# Patient Record
Sex: Female | Born: 1935
Health system: Southern US, Community
[De-identification: ages and names within clinical notes are randomized; demographics above are authoritative.]

## PROBLEM LIST (undated history)

## (undated) DIAGNOSIS — M1710 Unilateral primary osteoarthritis, unspecified knee: Secondary | ICD-10-CM

## (undated) DIAGNOSIS — N390 Urinary tract infection, site not specified: Secondary | ICD-10-CM

## (undated) DIAGNOSIS — Z9289 Personal history of other medical treatment: Secondary | ICD-10-CM

## (undated) DIAGNOSIS — M810 Age-related osteoporosis without current pathological fracture: Secondary | ICD-10-CM

## (undated) DIAGNOSIS — R0600 Dyspnea, unspecified: Secondary | ICD-10-CM

## (undated) DIAGNOSIS — I10 Essential (primary) hypertension: Secondary | ICD-10-CM

## (undated) DIAGNOSIS — C50919 Malignant neoplasm of unspecified site of unspecified female breast: Secondary | ICD-10-CM

## (undated) DIAGNOSIS — C9110 Chronic lymphocytic leukemia of B-cell type not having achieved remission: Secondary | ICD-10-CM

## (undated) DIAGNOSIS — H3530 Unspecified macular degeneration: Secondary | ICD-10-CM

## (undated) DIAGNOSIS — E785 Hyperlipidemia, unspecified: Secondary | ICD-10-CM

## (undated) DIAGNOSIS — F419 Anxiety disorder, unspecified: Secondary | ICD-10-CM

## (undated) HISTORY — DX: Malignant neoplasm of unspecified site of unspecified female breast: C50.919

## (undated) HISTORY — DX: Personal history of other medical treatment: Z92.89

## (undated) HISTORY — DX: Age-related osteoporosis without current pathological fracture: M81.0

## (undated) HISTORY — DX: Unilateral primary osteoarthritis, unspecified knee: M17.10

## (undated) HISTORY — PX: APPENDECTOMY: SHX54

## (undated) HISTORY — DX: Chronic lymphocytic leukemia of B-cell type not having achieved remission: C91.10

## (undated) HISTORY — PX: INCONTINENCE SURGERY: SHX676

## (undated) HISTORY — PX: OTHER SURGICAL HISTORY: SHX169

## (undated) HISTORY — DX: Hyperlipidemia, unspecified: E78.5

## (undated) HISTORY — DX: Essential (primary) hypertension: I10

## (undated) HISTORY — DX: Unspecified macular degeneration: H35.30

## (undated) HISTORY — PX: EYE SURGERY: SHX253

---

## 1970-09-18 HISTORY — PX: ABDOMINAL HYSTERECTOMY: SHX81

## 1999-06-14 ENCOUNTER — Other Ambulatory Visit: Admission: RE | Admit: 1999-06-14 | Discharge: 1999-06-14 | Payer: Self-pay | Admitting: Family Medicine

## 1999-10-04 ENCOUNTER — Encounter: Admission: RE | Admit: 1999-10-04 | Discharge: 1999-10-04 | Payer: Self-pay | Admitting: Family Medicine

## 1999-10-04 ENCOUNTER — Encounter: Payer: Self-pay | Admitting: Family Medicine

## 2001-09-16 ENCOUNTER — Other Ambulatory Visit: Admission: RE | Admit: 2001-09-16 | Discharge: 2001-09-16 | Payer: Self-pay | Admitting: Family Medicine

## 2001-11-26 ENCOUNTER — Encounter: Payer: Self-pay | Admitting: Family Medicine

## 2001-11-26 ENCOUNTER — Encounter: Admission: RE | Admit: 2001-11-26 | Discharge: 2001-11-26 | Payer: Self-pay | Admitting: Family Medicine

## 2002-03-14 ENCOUNTER — Encounter: Admission: RE | Admit: 2002-03-14 | Discharge: 2002-03-14 | Payer: Self-pay | Admitting: Family Medicine

## 2002-03-14 ENCOUNTER — Encounter: Payer: Self-pay | Admitting: Family Medicine

## 2002-09-18 DIAGNOSIS — C9110 Chronic lymphocytic leukemia of B-cell type not having achieved remission: Secondary | ICD-10-CM

## 2002-09-18 HISTORY — DX: Chronic lymphocytic leukemia of B-cell type not having achieved remission: C91.10

## 2002-10-19 DIAGNOSIS — C50919 Malignant neoplasm of unspecified site of unspecified female breast: Secondary | ICD-10-CM

## 2002-10-19 HISTORY — PX: MASTECTOMY PARTIAL / LUMPECTOMY W/ AXILLARY LYMPHADENECTOMY: SUR852

## 2002-10-19 HISTORY — DX: Malignant neoplasm of unspecified site of unspecified female breast: C50.919

## 2002-10-23 ENCOUNTER — Other Ambulatory Visit: Admission: RE | Admit: 2002-10-23 | Discharge: 2002-10-23 | Payer: Self-pay | Admitting: Radiology

## 2002-11-10 ENCOUNTER — Encounter: Payer: Self-pay | Admitting: General Surgery

## 2002-11-10 ENCOUNTER — Encounter: Admission: RE | Admit: 2002-11-10 | Discharge: 2002-11-10 | Payer: Self-pay | Admitting: General Surgery

## 2002-11-11 ENCOUNTER — Encounter (INDEPENDENT_AMBULATORY_CARE_PROVIDER_SITE_OTHER): Payer: Self-pay | Admitting: *Deleted

## 2002-11-11 ENCOUNTER — Encounter: Payer: Self-pay | Admitting: Radiology

## 2002-11-11 ENCOUNTER — Ambulatory Visit (HOSPITAL_BASED_OUTPATIENT_CLINIC_OR_DEPARTMENT_OTHER): Admission: RE | Admit: 2002-11-11 | Discharge: 2002-11-11 | Payer: Self-pay | Admitting: General Surgery

## 2002-11-11 ENCOUNTER — Encounter: Payer: Self-pay | Admitting: General Surgery

## 2002-11-11 ENCOUNTER — Encounter: Admission: RE | Admit: 2002-11-11 | Discharge: 2002-11-11 | Payer: Self-pay | Admitting: General Surgery

## 2002-12-01 ENCOUNTER — Ambulatory Visit: Admission: RE | Admit: 2002-12-01 | Discharge: 2003-02-04 | Payer: Self-pay | Admitting: *Deleted

## 2002-12-22 ENCOUNTER — Other Ambulatory Visit: Admission: RE | Admit: 2002-12-22 | Discharge: 2002-12-22 | Payer: Self-pay | Admitting: Oncology

## 2003-12-28 ENCOUNTER — Ambulatory Visit (HOSPITAL_COMMUNITY): Admission: RE | Admit: 2003-12-28 | Discharge: 2003-12-28 | Payer: Self-pay | Admitting: Gastroenterology

## 2004-05-26 ENCOUNTER — Ambulatory Visit (HOSPITAL_COMMUNITY): Admission: RE | Admit: 2004-05-26 | Discharge: 2004-05-26 | Payer: Self-pay | Admitting: *Deleted

## 2004-09-20 ENCOUNTER — Ambulatory Visit: Payer: Self-pay | Admitting: Oncology

## 2005-01-05 ENCOUNTER — Ambulatory Visit: Payer: Self-pay | Admitting: Oncology

## 2005-07-04 ENCOUNTER — Ambulatory Visit: Payer: Self-pay | Admitting: Oncology

## 2006-01-02 ENCOUNTER — Ambulatory Visit: Payer: Self-pay | Admitting: Oncology

## 2006-01-03 LAB — COMPREHENSIVE METABOLIC PANEL
AST: 23 U/L (ref 0–37)
Albumin: 4.6 g/dL (ref 3.5–5.2)
BUN: 14 mg/dL (ref 6–23)
CO2: 28 mEq/L (ref 19–32)
Calcium: 9.8 mg/dL (ref 8.4–10.5)
Chloride: 103 mEq/L (ref 96–112)
Glucose, Bld: 81 mg/dL (ref 70–99)
Potassium: 4.3 mEq/L (ref 3.5–5.3)

## 2006-01-03 LAB — MORPHOLOGY

## 2006-01-03 LAB — CBC WITH DIFFERENTIAL/PLATELET
Basophils Absolute: 0 10*3/uL (ref 0.0–0.1)
Eosinophils Absolute: 0.1 10*3/uL (ref 0.0–0.5)
HCT: 44.4 % (ref 34.8–46.6)
HGB: 14.8 g/dL (ref 11.6–15.9)
LYMPH%: 78.8 % — ABNORMAL HIGH (ref 14.0–48.0)
MCH: 30.6 pg (ref 26.0–34.0)
MCV: 91.8 fL (ref 81.0–101.0)
MONO%: 4.7 % (ref 0.0–13.0)
NEUT#: 3.1 10*3/uL (ref 1.5–6.5)
NEUT%: 15.8 % — ABNORMAL LOW (ref 39.6–76.8)
Platelets: 141 10*3/uL — ABNORMAL LOW (ref 145–400)

## 2006-01-03 LAB — LACTATE DEHYDROGENASE: LDH: 158 U/L (ref 94–250)

## 2006-07-02 ENCOUNTER — Ambulatory Visit: Payer: Self-pay | Admitting: Oncology

## 2006-07-04 LAB — COMPREHENSIVE METABOLIC PANEL
ALT: 15 U/L (ref 0–40)
Albumin: 4.6 g/dL (ref 3.5–5.2)
CO2: 27 mEq/L (ref 19–32)
Calcium: 9.9 mg/dL (ref 8.4–10.5)
Chloride: 104 mEq/L (ref 96–112)
Potassium: 4 mEq/L (ref 3.5–5.3)
Sodium: 140 mEq/L (ref 135–145)
Total Protein: 7.1 g/dL (ref 6.0–8.3)

## 2006-07-04 LAB — CBC WITH DIFFERENTIAL/PLATELET
BASO%: 0.6 % (ref 0.0–2.0)
HCT: 43.4 % (ref 34.8–46.6)
MCHC: 33.8 g/dL (ref 32.0–36.0)
MONO#: 1.1 10*3/uL — ABNORMAL HIGH (ref 0.1–0.9)
NEUT%: 16.5 % — ABNORMAL LOW (ref 39.6–76.8)
RBC: 4.73 10*6/uL (ref 3.70–5.32)
WBC: 23.1 10*3/uL — ABNORMAL HIGH (ref 3.9–10.0)
lymph#: 17.9 10*3/uL — ABNORMAL HIGH (ref 0.9–3.3)

## 2006-07-04 LAB — LACTATE DEHYDROGENASE: LDH: 159 U/L (ref 94–250)

## 2006-07-06 ENCOUNTER — Ambulatory Visit (HOSPITAL_COMMUNITY): Admission: RE | Admit: 2006-07-06 | Discharge: 2006-07-06 | Payer: Self-pay | Admitting: Oncology

## 2006-11-08 ENCOUNTER — Inpatient Hospital Stay (HOSPITAL_COMMUNITY): Admission: RE | Admit: 2006-11-08 | Discharge: 2006-11-10 | Payer: Self-pay | Admitting: Urology

## 2006-11-08 ENCOUNTER — Encounter (INDEPENDENT_AMBULATORY_CARE_PROVIDER_SITE_OTHER): Payer: Self-pay | Admitting: *Deleted

## 2006-12-28 ENCOUNTER — Ambulatory Visit: Payer: Self-pay | Admitting: Oncology

## 2007-01-02 LAB — CBC WITH DIFFERENTIAL/PLATELET
BASO%: 0.1 % (ref 0.0–2.0)
Basophils Absolute: 0 10*3/uL (ref 0.0–0.1)
LYMPH%: 74.6 % — ABNORMAL HIGH (ref 14.0–48.0)
MCH: 29.4 pg (ref 26.0–34.0)
MCV: 88.9 fL (ref 81.0–101.0)
MONO#: 0.8 10*3/uL (ref 0.1–0.9)
NEUT#: 3.8 10*3/uL (ref 1.5–6.5)
NEUT%: 20.7 % — ABNORMAL LOW (ref 39.6–76.8)
RBC: 4.68 10*6/uL (ref 3.70–5.32)
lymph#: 13.6 10*3/uL — ABNORMAL HIGH (ref 0.9–3.3)

## 2007-01-02 LAB — MORPHOLOGY: PLT EST: DECREASED

## 2007-01-02 LAB — COMPREHENSIVE METABOLIC PANEL
ALT: 13 U/L (ref 0–35)
Albumin: 4.3 g/dL (ref 3.5–5.2)
CO2: 25 mEq/L (ref 19–32)
Glucose, Bld: 156 mg/dL — ABNORMAL HIGH (ref 70–99)
Potassium: 3.8 mEq/L (ref 3.5–5.3)
Sodium: 140 mEq/L (ref 135–145)
Total Bilirubin: 0.5 mg/dL (ref 0.3–1.2)
Total Protein: 6.8 g/dL (ref 6.0–8.3)

## 2007-01-02 LAB — LACTATE DEHYDROGENASE: LDH: 153 U/L (ref 94–250)

## 2007-06-24 ENCOUNTER — Ambulatory Visit: Payer: Self-pay | Admitting: Oncology

## 2007-06-26 LAB — CBC WITH DIFFERENTIAL/PLATELET
BASO%: 0.2 % (ref 0.0–2.0)
EOS%: 0.7 % (ref 0.0–7.0)
HCT: 41.8 % (ref 34.8–46.6)
LYMPH%: 78.4 % — ABNORMAL HIGH (ref 14.0–48.0)
MCH: 30.6 pg (ref 26.0–34.0)
MCHC: 34.4 g/dL (ref 32.0–36.0)
MONO#: 0.8 10*3/uL (ref 0.1–0.9)
NEUT%: 16.6 % — ABNORMAL LOW (ref 39.6–76.8)
Platelets: 141 10*3/uL — ABNORMAL LOW (ref 145–400)
RBC: 4.69 10*6/uL (ref 3.70–5.32)
WBC: 20.1 10*3/uL — ABNORMAL HIGH (ref 3.9–10.0)
lymph#: 15.8 10*3/uL — ABNORMAL HIGH (ref 0.9–3.3)

## 2007-06-26 LAB — COMPREHENSIVE METABOLIC PANEL
Alkaline Phosphatase: 75 U/L (ref 39–117)
CO2: 25 mEq/L (ref 19–32)
Creatinine, Ser: 0.66 mg/dL (ref 0.40–1.20)
Glucose, Bld: 85 mg/dL (ref 70–99)
Total Bilirubin: 0.5 mg/dL (ref 0.3–1.2)

## 2007-06-26 LAB — MORPHOLOGY: RBC Comments: NORMAL

## 2007-12-23 ENCOUNTER — Ambulatory Visit: Payer: Self-pay | Admitting: Oncology

## 2007-12-25 LAB — COMPREHENSIVE METABOLIC PANEL
CO2: 25 mEq/L (ref 19–32)
Glucose, Bld: 99 mg/dL (ref 70–99)
Sodium: 143 mEq/L (ref 135–145)
Total Bilirubin: 0.8 mg/dL (ref 0.3–1.2)
Total Protein: 7.3 g/dL (ref 6.0–8.3)

## 2007-12-25 LAB — CBC WITH DIFFERENTIAL/PLATELET
BASO%: 0.3 % (ref 0.0–2.0)
Eosinophils Absolute: 0.1 10*3/uL (ref 0.0–0.5)
MCHC: 33.9 g/dL (ref 32.0–36.0)
MONO#: 1 10*3/uL — ABNORMAL HIGH (ref 0.1–0.9)
NEUT#: 3.6 10*3/uL (ref 1.5–6.5)
RBC: 4.88 10*6/uL (ref 3.70–5.32)
WBC: 24.2 10*3/uL — ABNORMAL HIGH (ref 3.9–10.0)
lymph#: 19.4 10*3/uL — ABNORMAL HIGH (ref 0.9–3.3)

## 2007-12-25 LAB — LACTATE DEHYDROGENASE: LDH: 164 U/L (ref 94–250)

## 2008-03-27 ENCOUNTER — Ambulatory Visit: Payer: Self-pay | Admitting: Oncology

## 2008-04-01 LAB — CBC WITH DIFFERENTIAL/PLATELET
BASO%: 0.2 % (ref 0.0–2.0)
LYMPH%: 75.5 % — ABNORMAL HIGH (ref 14.0–48.0)
MCHC: 33.8 g/dL (ref 32.0–36.0)
MCV: 89.4 fL (ref 81.0–101.0)
MONO#: 1.1 10*3/uL — ABNORMAL HIGH (ref 0.1–0.9)
MONO%: 4.7 % (ref 0.0–13.0)
NEUT#: 4.3 10*3/uL (ref 1.5–6.5)
Platelets: 128 10*3/uL — ABNORMAL LOW (ref 145–400)
RBC: 4.63 10*6/uL (ref 3.70–5.32)
RDW: 13.9 % (ref 11.3–14.5)
WBC: 22.6 10*3/uL — ABNORMAL HIGH (ref 3.9–10.0)

## 2008-06-11 ENCOUNTER — Ambulatory Visit: Payer: Self-pay | Admitting: Oncology

## 2008-06-18 LAB — CBC WITH DIFFERENTIAL/PLATELET
Basophils Absolute: 0 10*3/uL (ref 0.0–0.1)
Eosinophils Absolute: 0.1 10*3/uL (ref 0.0–0.5)
HCT: 43.5 % (ref 34.8–46.6)
HGB: 14.4 g/dL (ref 11.6–15.9)
LYMPH%: 77.7 % — ABNORMAL HIGH (ref 14.0–48.0)
MCV: 91.5 fL (ref 81.0–101.0)
MONO#: 0.7 10*3/uL (ref 0.1–0.9)
MONO%: 3 % (ref 0.0–13.0)
NEUT#: 4.2 10*3/uL (ref 1.5–6.5)
NEUT%: 18.5 % — ABNORMAL LOW (ref 39.6–76.8)
Platelets: 123 10*3/uL — ABNORMAL LOW (ref 145–400)
WBC: 22.7 10*3/uL — ABNORMAL HIGH (ref 3.9–10.0)

## 2008-06-18 LAB — COMPREHENSIVE METABOLIC PANEL
Albumin: 4 g/dL (ref 3.5–5.2)
Alkaline Phosphatase: 73 U/L (ref 39–117)
BUN: 16 mg/dL (ref 6–23)
Calcium: 10.1 mg/dL (ref 8.4–10.5)
Chloride: 104 mEq/L (ref 96–112)
Glucose, Bld: 136 mg/dL — ABNORMAL HIGH (ref 70–99)
Potassium: 3.7 mEq/L (ref 3.5–5.3)
Sodium: 140 mEq/L (ref 135–145)
Total Protein: 6.8 g/dL (ref 6.0–8.3)

## 2008-06-18 LAB — MORPHOLOGY: RBC Comments: NORMAL

## 2008-06-25 ENCOUNTER — Ambulatory Visit (HOSPITAL_COMMUNITY): Admission: RE | Admit: 2008-06-25 | Discharge: 2008-06-25 | Payer: Self-pay | Admitting: Oncology

## 2008-12-31 ENCOUNTER — Ambulatory Visit: Payer: Self-pay | Admitting: Oncology

## 2009-01-04 LAB — COMPREHENSIVE METABOLIC PANEL
Albumin: 4.4 g/dL (ref 3.5–5.2)
Alkaline Phosphatase: 76 U/L (ref 39–117)
BUN: 15 mg/dL (ref 6–23)
CO2: 27 mEq/L (ref 19–32)
Calcium: 9.3 mg/dL (ref 8.4–10.5)
Chloride: 104 mEq/L (ref 96–112)
Glucose, Bld: 89 mg/dL (ref 70–99)
Potassium: 3.9 mEq/L (ref 3.5–5.3)
Total Protein: 7 g/dL (ref 6.0–8.3)

## 2009-01-04 LAB — CBC WITH DIFFERENTIAL/PLATELET
Basophils Absolute: 0.1 10*3/uL (ref 0.0–0.1)
Eosinophils Absolute: 0.1 10*3/uL (ref 0.0–0.5)
HGB: 15.3 g/dL (ref 11.6–15.9)
MCV: 92 fL (ref 79.5–101.0)
MONO#: 0.9 10*3/uL (ref 0.1–0.9)
MONO%: 3.8 % (ref 0.0–14.0)
NEUT#: 3 10*3/uL (ref 1.5–6.5)
Platelets: 113 10*3/uL — ABNORMAL LOW (ref 145–400)
RDW: 14.4 % (ref 11.2–14.5)
WBC: 24 10*3/uL — ABNORMAL HIGH (ref 3.9–10.3)

## 2009-06-03 ENCOUNTER — Ambulatory Visit: Payer: Self-pay | Admitting: Oncology

## 2009-06-07 LAB — CBC WITH DIFFERENTIAL/PLATELET
Basophils Absolute: 0 10*3/uL (ref 0.0–0.1)
Eosinophils Absolute: 0.2 10*3/uL (ref 0.0–0.5)
HCT: 43.6 % (ref 34.8–46.6)
HGB: 14.8 g/dL (ref 11.6–15.9)
LYMPH%: 81.8 % — ABNORMAL HIGH (ref 14.0–49.7)
MCV: 92.5 fL (ref 79.5–101.0)
MONO%: 4.9 % (ref 0.0–14.0)
NEUT#: 2.9 10*3/uL (ref 1.5–6.5)
Platelets: 110 10*3/uL — ABNORMAL LOW (ref 145–400)
RDW: 14 % (ref 11.2–14.5)

## 2009-06-07 LAB — MORPHOLOGY
PLT EST: DECREASED
RBC Comments: NORMAL

## 2009-06-07 LAB — COMPREHENSIVE METABOLIC PANEL
AST: 20 U/L (ref 0–37)
Alkaline Phosphatase: 82 U/L (ref 39–117)
BUN: 18 mg/dL (ref 6–23)
Calcium: 9.2 mg/dL (ref 8.4–10.5)
Creatinine, Ser: 0.66 mg/dL (ref 0.40–1.20)

## 2009-09-02 ENCOUNTER — Ambulatory Visit: Payer: Self-pay | Admitting: Oncology

## 2009-09-06 LAB — CBC WITH DIFFERENTIAL/PLATELET
Basophils Absolute: 0.1 10*3/uL (ref 0.0–0.1)
HCT: 44.7 % (ref 34.8–46.6)
HGB: 14.8 g/dL (ref 11.6–15.9)
MONO#: 0.7 10*3/uL (ref 0.1–0.9)
NEUT%: 14.9 % — ABNORMAL LOW (ref 38.4–76.8)
WBC: 23.5 10*3/uL — ABNORMAL HIGH (ref 3.9–10.3)
lymph#: 19.2 10*3/uL — ABNORMAL HIGH (ref 0.9–3.3)

## 2009-09-06 LAB — MORPHOLOGY: PLT EST: DECREASED

## 2009-12-02 ENCOUNTER — Ambulatory Visit: Payer: Self-pay | Admitting: Oncology

## 2009-12-06 LAB — COMPREHENSIVE METABOLIC PANEL
Albumin: 4.3 g/dL (ref 3.5–5.2)
BUN: 18 mg/dL (ref 6–23)
Calcium: 9.4 mg/dL (ref 8.4–10.5)
Chloride: 104 mEq/L (ref 96–112)
Glucose, Bld: 86 mg/dL (ref 70–99)
Potassium: 4.1 mEq/L (ref 3.5–5.3)

## 2009-12-06 LAB — CBC WITH DIFFERENTIAL/PLATELET
Basophils Absolute: 0 10*3/uL (ref 0.0–0.1)
EOS%: 0.4 % (ref 0.0–7.0)
HCT: 45.3 % (ref 34.8–46.6)
HGB: 15.3 g/dL (ref 11.6–15.9)
MCH: 31.4 pg (ref 25.1–34.0)
MCV: 93 fL (ref 79.5–101.0)
MONO%: 3.7 % (ref 0.0–14.0)
NEUT%: 12.8 % — ABNORMAL LOW (ref 38.4–76.8)
RDW: 14.1 % (ref 11.2–14.5)

## 2010-04-11 ENCOUNTER — Ambulatory Visit: Payer: Self-pay | Admitting: Oncology

## 2010-04-13 LAB — COMPREHENSIVE METABOLIC PANEL
ALT: 11 U/L (ref 0–35)
AST: 18 U/L (ref 0–37)
Calcium: 9.4 mg/dL (ref 8.4–10.5)
Chloride: 103 mEq/L (ref 96–112)
Creatinine, Ser: 0.7 mg/dL (ref 0.40–1.20)
Sodium: 138 mEq/L (ref 135–145)
Total Protein: 6.9 g/dL (ref 6.0–8.3)

## 2010-04-13 LAB — CBC WITH DIFFERENTIAL/PLATELET
Basophils Absolute: 0.1 10*3/uL (ref 0.0–0.1)
Eosinophils Absolute: 0.1 10*3/uL (ref 0.0–0.5)
HCT: 43.3 % (ref 34.8–46.6)
HGB: 14.6 g/dL (ref 11.6–15.9)
LYMPH%: 79.2 % — ABNORMAL HIGH (ref 14.0–49.7)
MCV: 92.6 fL (ref 79.5–101.0)
MONO#: 1.2 10*3/uL — ABNORMAL HIGH (ref 0.1–0.9)
NEUT#: 3.6 10*3/uL (ref 1.5–6.5)
NEUT%: 15.2 % — ABNORMAL LOW (ref 38.4–76.8)
Platelets: 111 10*3/uL — ABNORMAL LOW (ref 145–400)
WBC: 23.5 10*3/uL — ABNORMAL HIGH (ref 3.9–10.3)

## 2010-04-13 LAB — MORPHOLOGY
PLT EST: DECREASED
RBC Comments: NORMAL

## 2010-08-22 ENCOUNTER — Ambulatory Visit: Payer: Self-pay | Admitting: Oncology

## 2010-08-24 LAB — CBC WITH DIFFERENTIAL/PLATELET
Eosinophils Absolute: 0.1 10*3/uL (ref 0.0–0.5)
MONO#: 0.6 10*3/uL (ref 0.1–0.9)
MONO%: 2.9 % (ref 0.0–14.0)
NEUT#: 2.7 10*3/uL (ref 1.5–6.5)
RBC: 4.71 10*6/uL (ref 3.70–5.45)
RDW: 14.4 % (ref 11.2–14.5)
WBC: 21 10*3/uL — ABNORMAL HIGH (ref 3.9–10.3)

## 2010-08-24 LAB — MORPHOLOGY: RBC Comments: NORMAL

## 2010-08-24 LAB — COMPREHENSIVE METABOLIC PANEL
Alkaline Phosphatase: 79 U/L (ref 39–117)
Glucose, Bld: 115 mg/dL — ABNORMAL HIGH (ref 70–99)
Sodium: 141 mEq/L (ref 135–145)
Total Bilirubin: 0.6 mg/dL (ref 0.3–1.2)
Total Protein: 6.8 g/dL (ref 6.0–8.3)

## 2010-12-21 ENCOUNTER — Encounter (HOSPITAL_BASED_OUTPATIENT_CLINIC_OR_DEPARTMENT_OTHER): Payer: BC Managed Care – HMO | Admitting: Oncology

## 2010-12-21 ENCOUNTER — Other Ambulatory Visit: Payer: Self-pay | Admitting: Oncology

## 2010-12-21 DIAGNOSIS — R21 Rash and other nonspecific skin eruption: Secondary | ICD-10-CM

## 2010-12-21 DIAGNOSIS — M899 Disorder of bone, unspecified: Secondary | ICD-10-CM

## 2010-12-21 DIAGNOSIS — C9110 Chronic lymphocytic leukemia of B-cell type not having achieved remission: Secondary | ICD-10-CM

## 2010-12-21 DIAGNOSIS — Z853 Personal history of malignant neoplasm of breast: Secondary | ICD-10-CM

## 2010-12-21 LAB — CBC WITH DIFFERENTIAL/PLATELET
Basophils Absolute: 0 10*3/uL (ref 0.0–0.1)
Eosinophils Absolute: 0.1 10*3/uL (ref 0.0–0.5)
HCT: 46.1 % (ref 34.8–46.6)
HGB: 15.4 g/dL (ref 11.6–15.9)
LYMPH%: 79.1 % — ABNORMAL HIGH (ref 14.0–49.7)
MCHC: 33.5 g/dL (ref 31.5–36.0)
MONO#: 1.2 10*3/uL — ABNORMAL HIGH (ref 0.1–0.9)
NEUT#: 3.3 10*3/uL (ref 1.5–6.5)
NEUT%: 14.6 % — ABNORMAL LOW (ref 38.4–76.8)
Platelets: 97 10*3/uL — ABNORMAL LOW (ref 145–400)
WBC: 22.4 10*3/uL — ABNORMAL HIGH (ref 3.9–10.3)

## 2010-12-21 LAB — MORPHOLOGY: PLT EST: DECREASED

## 2010-12-22 LAB — COMPREHENSIVE METABOLIC PANEL
ALT: 12 U/L (ref 0–35)
AST: 20 U/L (ref 0–37)
CO2: 26 mEq/L (ref 19–32)
Calcium: 10 mg/dL (ref 8.4–10.5)
Chloride: 103 mEq/L (ref 96–112)
Potassium: 4.1 mEq/L (ref 3.5–5.3)
Sodium: 140 mEq/L (ref 135–145)
Total Protein: 6.8 g/dL (ref 6.0–8.3)

## 2010-12-22 LAB — LACTATE DEHYDROGENASE: LDH: 144 U/L (ref 94–250)

## 2011-02-03 NOTE — H&P (Signed)
NAME:  Abigail Moreno, GUNDRUM NO.:  [id number]   MEDICAL RECORD NO.:  [id number]          PATIENT TYPE:  INP   LOCATION:  X002                         FACILITY:  [facility]   PHYSICIAN:  Sigmund I. Patsi Sears, M.D.DATE OF BIRTH:  10-23-1928   DATE OF ADMISSION:  11/08/2006  DATE OF DISCHARGE:                              HISTORY & PHYSICAL   CHIEF COMPLAINT:  Complete pelvic organ prolapse.   HISTORY OF PRESENT ILLNESS:  [name] is a 77 year old lady who  was seen at Dr. Imelda Pillow regarding her complete pelvic organ  prolapse.  The patient has been bothered by the bulge.  She does have  nocturia and difficulty starting her urination.  Otherwise, the patient  denies any history of UTIs.  She does recall a history of bladder  tacking in 1995.   PAST MEDICAL HISTORY:  Significant for:  1. Elevated cholesterol.  2. Left breast cancer in 2004.  3. Chronic leukemia.   PAST SURGICAL HISTORY:  1. Benign breast cyst.  2. Hysterectomy.  3. Bladder tack.  4. Cataract surgery.   MEDICATIONS:  1. Lotrel.  2. Femara.  3. Zocor.  4. Actonel.  5. __________ .  6. Calcium.  7. Multivitamins.   ALLERGIES:  NOVOCAIN.   FAMILY HISTORY:  Significant for kidney stones in her son.   SOCIAL HISTORY:  The patient is retired.  She is married with 2 sons and  1 daughter.  She smoked 1 pack a week for about 3 years but denies  smoking the last 40 years.  The patient denies any alcohol abuse.   REVIEW OF SYSTEMS:  Significant for chronic sinusitis, chronic cough,  anxiety; however, rest of review of systems done and is negative.   PHYSICAL EXAMINATION:  GENERAL:  The patient is in no acute distress.  She awake, alert and oriented.  LUNGS:  Good breath sounds bilaterally.  ABDOMEN:  Soft, nontender.  No cervical lymphadenopathy. No CVA  tenderness.  SKIN:  Reveals no rashes.  EXTREMITIES:  Do not reveal any swellings.  PELVIC:  Did reveal complete pelvic organ prolapse  with thickened  vaginal mucosa changes.  The patient is not sexually active.   ASSESSMENT/PLAN:  This is a 77 year old lady with complete pelvic organ  prolapse.  The patient will undergo a cystocele, rectocele and entocele  repair with potential grafting.  Postoperatively should be observed on  the floor for postop care.  The potential benefits and risks of the  procedure were discussed and the patient agreed to proceed forward.     ______________________________  Terie Purser, MD      Sigmund I. Patsi Sears, M.D.  Electronically Signed    JH/MEDQ  D:  11/08/2006  T:  11/08/2006  Job:  [id number]

## 2011-02-03 NOTE — Discharge Summary (Signed)
NAME:  Abigail Moreno, Abigail Moreno           ACCOUNT NO.:  [id number]   MEDICAL RECORD NO.:  [id number]          PATIENT TYPE:  INP   LOCATION:  1431                         FACILITY:  [facility]   PHYSICIAN:  Sigmund I. Patsi Sears, M.D.DATE OF BIRTH:  [birth year]   DATE OF ADMISSION:  11/08/2006  DATE OF DISCHARGE:  11/10/2006                               DISCHARGE SUMMARY   ADMISSION DIAGNOSIS:  Complete pelvic organ prolapse with caruncle.   DISCHARGE DIAGNOSIS:  Complete pelvic organ prolapse with caruncle.   PROCEDURES DONE:  1. Laser excision of caruncle.  2. Anterior vaginal vault prolapse with vault suspension using graft      Harrold Donath ), rectocele with enterocele repair, and cystoscopy, all      done on November 08, 2006.   HISTORY OF PRESENT ILLNESS/HOSPITAL COURSE:  Abigail Moreno is a 77-  year-old lady who presented to Dr. Imelda Pillow attention with complete  pelvic organ prolapse.  At this point, she is becoming bothered by the  bulge.  After extensive counseling, the patient elected for surgical  repair.  The potential benefit and risks were extensively discussed, and  the patient did want to proceed forward.  The patient was admitted on  November 08, 2006 in which she successfully underwent the procedure.  Postoperatively, the patient has a Foley catheter and vaginal packs.  Both were kept for two days.   Her postoperative course was relatively smooth and uneventful.  The  patient was ambulating with no problems.  Her pain was adequately  controlled.  By postop day #2, the patient vaginal packs and the Foley  catheter were removed, and the patient voided with no problems.  The  patient remained hemodynamically stable throughout her hospital stay.  On postop day #2, the patient was discharged home.  At discharge, the  patient was afebrile.  Her vital signs were stable.  She was awake,  alert and oriented.  Her abdomen was soft and nontender.  Her wound was  intact with no  evidence of hematoma or bleeding.  Gross motor  neurological exam is nonfocal.  The patient was then discharged home on  antibiotics, pain medications, and stool softeners.  She was advised to  resume her home medications.  She is to follow up with Dr. Patsi Sears,  as scheduled.  Should the patient have any fever, chills, nausea,  vomiting, inability to void or any questions or concerns, she is to  contact us or come to the ED.     ______________________________  Cornelious Bryant, MD      Sigmund I. Patsi Sears, M.D.  Electronically Signed    SK/MEDQ  D:  11/12/2006  T:  11/12/2006  Job:  [id number]

## 2011-02-03 NOTE — Op Note (Signed)
NAME:  Abigail Moreno, Abigail Moreno NO.:  [id number]   MEDICAL RECORD NO.:  [id number]          PATIENT TYPE:  INP   LOCATION:  X002                         FACILITY:  [facility]   PHYSICIAN:  Sigmund I. Patsi Sears, M.D.DATE OF BIRTH:  Feb 09, 1929   DATE OF PROCEDURE:  11/08/2006  DATE OF DISCHARGE:                               OPERATIVE REPORT   PREOPERATIVE DIAGNOSIS:  Complete pelvic organ prolapse with caruncle.   POSTOPERATIVE DIAGNOSIS:  Complete pelvic organ prolapse with caruncle.   OPERATIONS:  1. Laser excision of caruncle.  2. Anterior prolapse repair with graft (Xenform.)  3. Rectocele with enterocele repair.  4. Cystoscopy.   SURGEON:  Sigmund I. Patsi Sears, M.D.   ASSISTANT:  Martina Sinner, M.D.   SECOND ASSISTANT:  Cornelious Bryant, M.D.   ANESTHESIA:  General.   SPECIMENS:  Enterocele sac to pathology for permanent section.   ESTIMATED BLOOD LOSS:  About 100 cc.   COMPLICATIONS:  None.   INDICATIONS FOR PROCEDURE:  This is a 77 year old lady who was seen and  evaluated by Dr. Patsi Sears in clinic for pelvic organ prolapse.  The  patient has had a hysterectomy done before.  At this point, her pelvic  organ prolapse is really bothering her and she wants it to be fixed.  On  physical examination, she did have a small urethral caruncle.  After  extensive counseling, she consented for laser excision of the caruncle  in addition to transvaginal vault suspension, cystocele, enterocele and  rectocele repair.   DESCRIPTION OF PROCEDURE:  The patient was brought to the operating  room.  Preoperative antibiotics were given.  General anesthesia was  induced.  She was placed in the dorsal lithotomy position.  Bilateral  SCD's were applied to prevent DVT.  Pressure points were padded  appropriately and care was taken during positioning to avoid neuropathy  and compartment syndrome.  The patient was prepped and draped in normal  sterile fashion.  Using the  holmium laser, the caruncle at the level of  the urethra was burned.  Foley catheter was then inserted.  On physical  examination, the patient did have complete pelvic organ prolapse with a  bulge coming out through her vaginal introitus.  The vaginal vault  dimple was clearly identified and 0 Vicryl suture was applied there to  delineate the apex of the vaginal vault.  Midline incision was done  across the vaginal mucosa along the anterior wall, vaginal apex, and the  posterior wall.  Dissection was then carried elevating the vaginal  mucosa from the underlying endocervical fascia and the rectovaginal  fascia.  After mobilizing the vaginal mucosa, scope retractors were  applied and the mucosa was then retracted using the hooks.  A large  cystocele was evident with the enterocele abutting the posterior aspect  of the cystocele.  Using a combination of traction, countertraction,  blunt and sharp dissection, the enterocele sac was dissected off the  posterior wall of the cystocele.  Cystoscopy and inflating the bladder  with saline did help in the identification process and the dissection  process.  The enterocele sac was mobilized anteriorly off  the cystocele,  laterally from the vaginal mucosa, and posterior from the rectocele.  After adequate mobilization, the enterocele sac was then nicked with  Strully scissors and was entered.  Using Ethibond, the neck of the  enterocele sac was then tied in a pursestring fashion.  The excess  enterocele sac was then cut.  Following repair of the enterocele, indigo  carmine was given.  Cystoscopy did confirm efflux of urine through both  ureteral orifices.   Cystocele repair:  The cystocele was dissected laterally to the white  line on the pelvic sidewalls.  The dissection was also carried  posteriorly to the level of the ischial spines.  The cystocele was then  repaired using Kelly plication.  Absorbable suture was used and the  cystocele was  repaired using two suture lines, thus reducing the  cystocele adequately.  Using the [facility] device, an Ethibond suture was  applied at the level of the ischial spine bilaterally.  The bullet on  each of the Ethibond sutures was then cut and Mayo needle was then used  to bring the sutures through the Xenform graft.  Prior to the use of the  Xenform graft, the graft was imbibed in saline.  The size of the graft  was 8 x 12 cm and it was cut in a trapezoid fashion.  The broad base of  the trapezoid going into the proximal aspect of the graft.  The Ethibond  sutures were brought through the graft at the level of the broad base.  The other end of the Ethibond suture had the needle that was brought  again through the broad base of the graft.  The graft was then tied down  to the level of the ischial spines bilaterally.  This reduced the  cystocele completely and the patient had a flattened anterior vaginal  wall.  Using 2-0 Vicryl, deep bites were taken into the pelvic sidewall  bilaterally and the sutures were then brought through the distal aspect  of the graft and tied.  This ended the anterior vaginal vault suspension  and cystocele repair.  Following the cystocele and enterocele repair,  cystoscopy did reveal blue dye coming through both ureteral orifices  with no problems.   Rectocele repair:  Rectal exam was done.  This clearly demarcated the  rectal wall herniation through the posterior rectovaginal fascia.  An in  situ defect repair was done using 2-0 Vicryl in a running fashion.  This  was done in a two layer fashion.  Repeat rectal examination did not  reveal any evidence of suture in the rectum.  This flattened the  posterior vaginal wall completely.  Redundant vaginal mucosa was then  trimmed.  The vaginal mucosa was then closed using 2-0 Vicryl in a  running fashion.  Estrogen cream soaked pads were then applied.  The Foley catheter was then put into dependent drainage.  The  procedure  terminated without complications.  The patient was extubated in the  operating room and taken in satisfactory condition to the PACU.     ______________________________  Cornelious Bryant, MD      Sigmund I. Patsi Sears, M.D.  Electronically Signed    SK/MEDQ  D:  11/08/2006  T:  11/08/2006  Job:  [id number]

## 2011-04-05 ENCOUNTER — Other Ambulatory Visit: Payer: Self-pay | Admitting: Oncology

## 2011-04-05 ENCOUNTER — Encounter (HOSPITAL_BASED_OUTPATIENT_CLINIC_OR_DEPARTMENT_OTHER): Payer: BC Managed Care – HMO | Admitting: Oncology

## 2011-04-05 ENCOUNTER — Other Ambulatory Visit (HOSPITAL_COMMUNITY)
Admission: RE | Admit: 2011-04-05 | Discharge: 2011-04-05 | Disposition: A | Discharge status: Home / Self Care | Payer: Medicare Other | Source: Ambulatory Visit | Attending: Oncology | Admitting: Oncology

## 2011-04-05 DIAGNOSIS — M949 Disorder of cartilage, unspecified: Secondary | ICD-10-CM

## 2011-04-05 DIAGNOSIS — M899 Disorder of bone, unspecified: Secondary | ICD-10-CM

## 2011-04-05 DIAGNOSIS — R21 Rash and other nonspecific skin eruption: Secondary | ICD-10-CM

## 2011-04-05 DIAGNOSIS — C9110 Chronic lymphocytic leukemia of B-cell type not having achieved remission: Secondary | ICD-10-CM

## 2011-04-05 DIAGNOSIS — Z853 Personal history of malignant neoplasm of breast: Secondary | ICD-10-CM

## 2011-04-05 LAB — CBC WITH DIFFERENTIAL/PLATELET
Basophils Absolute: 0 10*3/uL (ref 0.0–0.1)
EOS%: 0.4 % (ref 0.0–7.0)
Eosinophils Absolute: 0.1 10*3/uL (ref 0.0–0.5)
HCT: 43.8 % (ref 34.8–46.6)
HGB: 14.6 g/dL (ref 11.6–15.9)
MCH: 30.8 pg (ref 25.1–34.0)
MCV: 92.4 fL (ref 79.5–101.0)
MONO%: 3.9 % (ref 0.0–14.0)
NEUT#: 2.8 10*3/uL (ref 1.5–6.5)
NEUT%: 13.3 % — ABNORMAL LOW (ref 38.4–76.8)
lymph#: 17.1 10*3/uL — ABNORMAL HIGH (ref 0.9–3.3)

## 2011-04-05 LAB — MORPHOLOGY

## 2011-04-07 LAB — FLOW CYTOMETRY

## 2011-04-19 LAB — CYTOGENETICS, PERIPHERAL BLOOD

## 2011-06-28 ENCOUNTER — Other Ambulatory Visit: Payer: Self-pay | Admitting: Oncology

## 2011-06-28 ENCOUNTER — Encounter (HOSPITAL_BASED_OUTPATIENT_CLINIC_OR_DEPARTMENT_OTHER): Payer: Medicare Other | Admitting: Oncology

## 2011-06-28 DIAGNOSIS — C9110 Chronic lymphocytic leukemia of B-cell type not having achieved remission: Secondary | ICD-10-CM

## 2011-06-28 DIAGNOSIS — Z853 Personal history of malignant neoplasm of breast: Secondary | ICD-10-CM

## 2011-06-28 DIAGNOSIS — R21 Rash and other nonspecific skin eruption: Secondary | ICD-10-CM

## 2011-06-28 DIAGNOSIS — M949 Disorder of cartilage, unspecified: Secondary | ICD-10-CM

## 2011-06-28 LAB — CBC WITH DIFFERENTIAL/PLATELET
Basophils Absolute: 0.1 10*3/uL (ref 0.0–0.1)
Eosinophils Absolute: 0.1 10*3/uL (ref 0.0–0.5)
HGB: 14.6 g/dL (ref 11.6–15.9)
LYMPH%: 77.1 % — ABNORMAL HIGH (ref 14.0–49.7)
MCV: 91.9 fL (ref 79.5–101.0)
MONO%: 5.5 % (ref 0.0–14.0)
NEUT#: 3.5 10*3/uL (ref 1.5–6.5)
NEUT%: 16.7 % — ABNORMAL LOW (ref 38.4–76.8)
Platelets: 91 10*3/uL — ABNORMAL LOW (ref 145–400)

## 2011-07-19 ENCOUNTER — Ambulatory Visit (HOSPITAL_COMMUNITY)
Admission: RE | Admit: 2011-07-19 | Discharge: 2011-07-19 | Disposition: A | Discharge status: Home / Self Care | Payer: Medicare Other | Source: Ambulatory Visit | Attending: Oncology | Admitting: Oncology

## 2011-07-19 ENCOUNTER — Other Ambulatory Visit: Payer: Self-pay | Admitting: Oncology

## 2011-07-19 DIAGNOSIS — C9110 Chronic lymphocytic leukemia of B-cell type not having achieved remission: Secondary | ICD-10-CM | POA: Insufficient documentation

## 2011-09-16 ENCOUNTER — Telehealth: Payer: Self-pay | Admitting: Oncology

## 2011-09-16 NOTE — Telephone Encounter (Signed)
S/w pt, gave appt 10/20/11 @ 9am.

## 2011-09-28 ENCOUNTER — Ambulatory Visit
Admission: RE | Admit: 2011-09-28 | Discharge: 2011-09-28 | Disposition: A | Discharge status: Home / Self Care | Payer: Medicare Other | Source: Ambulatory Visit | Attending: Family Medicine | Admitting: Family Medicine

## 2011-09-28 ENCOUNTER — Other Ambulatory Visit: Payer: Self-pay | Admitting: Family Medicine

## 2011-09-28 DIAGNOSIS — M25551 Pain in right hip: Secondary | ICD-10-CM

## 2011-10-20 ENCOUNTER — Telehealth: Payer: Self-pay | Admitting: Oncology

## 2011-10-20 ENCOUNTER — Ambulatory Visit (HOSPITAL_BASED_OUTPATIENT_CLINIC_OR_DEPARTMENT_OTHER): Payer: Medicare Other | Admitting: Oncology

## 2011-10-20 ENCOUNTER — Other Ambulatory Visit (HOSPITAL_BASED_OUTPATIENT_CLINIC_OR_DEPARTMENT_OTHER): Payer: Medicare Other | Admitting: Lab

## 2011-10-20 VITALS — BP 141/92 | HR 78 | Temp 96.8°F | Ht 61.0 in | Wt 153.7 lb

## 2011-10-20 DIAGNOSIS — C9110 Chronic lymphocytic leukemia of B-cell type not having achieved remission: Secondary | ICD-10-CM

## 2011-10-20 DIAGNOSIS — IMO0002 Reserved for concepts with insufficient information to code with codable children: Secondary | ICD-10-CM

## 2011-10-20 DIAGNOSIS — Z853 Personal history of malignant neoplasm of breast: Secondary | ICD-10-CM

## 2011-10-20 DIAGNOSIS — H579 Unspecified disorder of eye and adnexa: Secondary | ICD-10-CM

## 2011-10-20 LAB — COMPREHENSIVE METABOLIC PANEL
AST: 20 U/L (ref 0–37)
Albumin: 4.7 g/dL (ref 3.5–5.2)
Alkaline Phosphatase: 76 U/L (ref 39–117)
Potassium: 4 mEq/L (ref 3.5–5.3)
Sodium: 142 mEq/L (ref 135–145)
Total Protein: 7 g/dL (ref 6.0–8.3)

## 2011-10-20 LAB — CBC WITH DIFFERENTIAL/PLATELET
Basophils Absolute: 0.1 10*3/uL (ref 0.0–0.1)
Eosinophils Absolute: 0.1 10*3/uL (ref 0.0–0.5)
HGB: 15.1 g/dL (ref 11.6–15.9)
MCV: 91.6 fL (ref 79.5–101.0)
NEUT#: 3.2 10*3/uL (ref 1.5–6.5)
RDW: 14.7 % — ABNORMAL HIGH (ref 11.2–14.5)
lymph#: 14.7 10*3/uL — ABNORMAL HIGH (ref 0.9–3.3)

## 2011-10-20 LAB — MORPHOLOGY: PLT EST: DECREASED

## 2011-10-20 NOTE — Telephone Encounter (Signed)
Gv pt appt for march2013 °

## 2011-10-20 NOTE — Patient Instructions (Signed)
Your platelets are still a little low, 88 today.  We will try prednisone 20 mg with breakfast for 3 days every other week:  Feb 11,12,13 and Feb 25,26,27.  Dr.Darril Patriarca with see you and repeat blood counts on March 1.   You can use your xanax at bedtime if prednisone makes it hard to sleep. Let Dr.Olin know if the prednisone helps your hip.   Call if you need to tell Dr.Felicha Frayne anything about the prednisone before next visit    [phone].

## 2011-10-20 NOTE — Progress Notes (Signed)
OFFICE PROGRESS NOTE Date of Visit Oct 20, 2011 Physicians: K.Clelia Croft, M.Olin  INTERVAL HISTORY:   Patient is seen, alone for visit today, in continuing attention to her CLL, this diagnosed in 2004 and not treated to this point, altho platelet count has been gradually decreasing and we have anticipated beginning some treatment in near future. She has had no bleeding since last visit, and no fevers or symptoms of infection. Abdominal US for spleen size was done 07-19-11 with normal splenic volume of 214 ml, with no focal splenic defects. Note flow cytometry in July 2012 confirmed CLL, with FISH suggesting 13q- and 17p- .  Patient is receiving avastin injections to left eye, follow up with ophthalmology upcoming and I have given her copy of today's CBC for that appointment. She saw primary Dr.K.Clelia Croft recently including for hip pain, with Lspine films 09-28-11 showing diffuse debenerative disc disease and osteopenia without compression fractures, and pelvis xray with significant degenerative joint disease in hips particularly right. She was seen by orthopedics PA and has appointment with Dr.Matthew Charlann Boxer ~ 11-10-11. She has been using 1 aleve every 3 days and uses very sparing hydrocodone from Dr.Shaw. Next appointment for Dr.Shaw is scheduled in a year, with blood work in interim. Otherwise, she has had no fever or symptoms of infection, no bleeding or excessive bruising; no respiratory, cardiac, GI or bladder symptoms. Husband has had respiratory infection, improving. She has had no swelling LE. Remainder of 10 point Review of Systems negative/ unchanged.  Objective:  Vital signs in last 24 hours:  BP 141/92  Pulse 78  Temp(Src) 96.8 F (36 C) (Oral)  Ht 5\' 1"  (1.549 m)  Wt 153 lb 11.2 oz (69.718 kg)  BMI 29.04 kg/m2  Mobile slowly in office due to hips, otherwise appears comfortable.Weight is stable  HEENT:mucous membranes moist, pharynx normal without lesions. PERRL.  LymphaticsCervical,  supraclavicular, and axillary nodes normal.CORRECTION: cervical and supraclavicular adenopathy right > left ~ 4-5 cm aggregate, rubbery, nontender.No bulky axillary or inguinal nodes. Resp: clear to auscultation bilaterally and normal percussion bilaterally Cardio: regular rate and rhythm GI: soft, non-tender; bowel sounds normal; no masses,  no organomegaly including no palpable spleen Extremities: extremities normal, atraumatic, no cyanosis or edema Breasts without dominant masses, skin changes, nipple discharge. Left lumpectomy scar well-healed. No swelling UE. Skin without bruises or petechiae, no rash.    Lab Results:   [facility] 10/20/11 0848  WBC 19.0*  HGB 15.1  HCT 44.7  PLT 88*   ANC 3.2 plt 98k in July, 91k in Oct Some variant lymphs, few smudge cells, no giant plt called BMET  [facility] 10/20/11 0848  NA 142  K 4.0  CL 103  CO2 29  GLUCOSE 88  BUN 23  CREATININE 0.75  CALCIUM 10.3  alb 4.7. Remainder of full CMET WNL. LDH 143   Studies/Results:  US spleen and plain xrays as above  Medications: I have reviewed the patient's current medications.  With platelets lower, I have recommended that we try low dose prednisone over next few weeks, to see if that is helpful. She will take prednisone 20 mg with breakfast 2-11,12,13 and again 2-25,26,27. If the prednisone helps hip symptoms also, she will let Dr.Olin know. She can use xanax at hs if difficulty sleeping. She knows that she can call at any time prior to my next visit 11-17-11 if concerns including bleeding or problems with steroids.  Assessment/Plan:  1. CLL with lower platelets in absence of splenomegaly, counts, peripheral adenopathy and clinically  otherwise stable. Will try careful prednisone as above. 2. Ophthalmologic problems, receiving intraoccular avastin with no complications reported 3.Significant degenerative arthritis and disc disease, orthopedics evaluation in process. We have tried to use NSAID  sparingly due to thrombocytopenia.  4.left breast cancer early stage 03-25-2004treated with lumpectomy and sentinel node evaluation, local RT and 5 years of Femara thru April 2009. No known active disease since then. For mammograms ~ 12/11/22. 5.Death of granddaughter in past year.       LIVESAY,LENNIS P, MD   10/20/2011, 10:04 PM

## 2011-11-01 ENCOUNTER — Telehealth: Payer: Self-pay

## 2011-11-01 NOTE — Telephone Encounter (Signed)
FAXED SIGNED ORDERS DATED 11-01-11 TO [facility] FOR MAMMOGRAM WITH [place] IF NECESSARY.  APPT. AT [facility] IS ON 11-13-11.

## 2011-11-17 ENCOUNTER — Telehealth: Payer: Self-pay | Admitting: Oncology

## 2011-11-17 ENCOUNTER — Other Ambulatory Visit: Payer: Medicare Other | Admitting: Lab

## 2011-11-17 ENCOUNTER — Ambulatory Visit (HOSPITAL_BASED_OUTPATIENT_CLINIC_OR_DEPARTMENT_OTHER): Payer: Medicare Other | Admitting: Oncology

## 2011-11-17 VITALS — BP 132/82 | HR 80 | Temp 96.7°F | Ht 61.0 in | Wt 153.6 lb

## 2011-11-17 DIAGNOSIS — C50919 Malignant neoplasm of unspecified site of unspecified female breast: Secondary | ICD-10-CM

## 2011-11-17 DIAGNOSIS — C9110 Chronic lymphocytic leukemia of B-cell type not having achieved remission: Secondary | ICD-10-CM

## 2011-11-17 LAB — COMPREHENSIVE METABOLIC PANEL
AST: 23 U/L (ref 0–37)
Albumin: 4.8 g/dL (ref 3.5–5.2)
Alkaline Phosphatase: 76 U/L (ref 39–117)
BUN: 19 mg/dL (ref 6–23)
Creatinine, Ser: 0.72 mg/dL (ref 0.50–1.10)
Potassium: 4.1 mEq/L (ref 3.5–5.3)

## 2011-11-17 LAB — CBC WITH DIFFERENTIAL/PLATELET
Eosinophils Absolute: 0.1 10*3/uL (ref 0.0–0.5)
LYMPH%: 77.2 % — ABNORMAL HIGH (ref 14.0–49.7)
MCHC: 33.2 g/dL (ref 31.5–36.0)
MCV: 92.1 fL (ref 79.5–101.0)
MONO%: 4.7 % (ref 0.0–14.0)
NEUT#: 4 10*3/uL (ref 1.5–6.5)
Platelets: 96 10*3/uL — ABNORMAL LOW (ref 145–400)
RBC: 5.06 10*6/uL (ref 3.70–5.45)

## 2011-11-17 NOTE — Patient Instructions (Signed)
Call if bleeding or infection.  Hip surgery April 9 as scheduled.

## 2011-11-17 NOTE — Telephone Encounter (Signed)
Gv pt appt for may2013 

## 2011-11-18 DIAGNOSIS — C50919 Malignant neoplasm of unspecified site of unspecified female breast: Secondary | ICD-10-CM | POA: Insufficient documentation

## 2011-11-18 DIAGNOSIS — C9110 Chronic lymphocytic leukemia of B-cell type not having achieved remission: Secondary | ICD-10-CM | POA: Insufficient documentation

## 2011-11-18 NOTE — Progress Notes (Signed)
OFFICE PROGRESS NOTE Date of Visit 11-17-2011 Physicians: K.Clelia Croft, M.Olin   INTERVAL HISTORY:   Patient is seen, alone for visit today, in follow up of recent lower platelet count, this in setting of CLL. The CLL was diagnosed in 2004 and has not required intervention until now; the slightly progressive thrombocytopenia has not been symptomatic. Abdominal US in Oct 2012 had normal splenic volume. Since she was here last in early Feb, we tried two short courses of prednisone at just 20 mg daily x 3 every other week. She tolerated this without difficulty, did not notice any improvement in the orthopedic symptoms with the steroids. Last prednisone was Feb 25 - 27. CBC today has platelets up to 96k, these having been 88k prior to the brief steroids.  PMH is also significant for an early stage left breast cancer in 2004, treated with lumpectomy with sentinel node evaluation, local RT and 5 years of Femara thru 12-2007. She had bilateral mammograms at [facility] 11-13-2011, with heterogeneously dense breast tissue and some increase in benign appearing calcifications bilaterally, with no mammographic findings of concern.  Patient has been seen back by Dr.Olin, and would like to proceed with right hip replacement as she is very symptomatic from degenerative problems there. The surgery is scheduled for 12-26-2011. She understands that she maybe at somewhat increased risk of bleeding and need platelet transfusion with the surgery, however I think this is still reasonable and expect that she is as stable now for surgery as she will be.  Objective:  Vital signs in last 24 hours:  BP 132/82  Pulse 80  Temp(Src) 96.7 F (35.9 C) (Oral)  Ht 5\' 1"  (1.549 m)  Wt 153 lb 9.6 oz (69.673 kg)  BMI 29.02 kg/m2  Alert, looks comfortable seated in exam room, slowly ambulatory without assistance.  HEENT:mucous membranes moist, pharynx normal without lesions. PERRL LymphaticsCervical, supraclavicular, and axillary nodes  normal. and unchanged Resp: clear to auscultation bilaterally Cardio: regular rate and rhythm GI: soft, non-tender; bowel sounds normal; no masses,  no organomegaly Extremities: extremities normal, atraumatic, no cyanosis or edema Skin without bruises or petechiae  Lab Results:   Basename 11/17/11 1238  WBC 22.9*  HGB 15.5  HCT 46.6  PLT 96*  no giant platelets seen  BMET  Basename 11/17/11 1238  NA 141  K 4.1  CL 100  CO2 28  GLUCOSE 83  BUN 19  CREATININE 0.72  CALCIUM 10.7*  albumin 4.8 Remainder of full CMET WNL Studies/Results:  No results found.  Medications: I have reviewed the patient's current medications. We will not do further prednisone before hip replacement.  Assessment/Plan:  1.CLL: improvement in platelet count today, still just under 100k. No other obvious need to begin treatment immediately. I will see her back in May, or sooner if needed. 2.History of early stage left breast cancer, post treatment as above and not known active. Mammograms done as above. 3.Degenerative disease right hip, markedly limiting activity. For hip replacement per Dr.Olin 4.obesity Patient was in agreement with plan.       Reece Packer, MD   11/18/2011, 5:32 PM

## 2011-11-20 ENCOUNTER — Telehealth: Payer: Self-pay

## 2011-11-20 NOTE — Telephone Encounter (Signed)
FAXED OFFICE NOTES FROM 2-1;11-17-11 AND LABS FROM 11-17-11 TO DR. OLIN  ATTENTION SHERRY FOR PT.'S SURGERY 12-26-11.

## 2011-12-11 ENCOUNTER — Encounter (HOSPITAL_COMMUNITY): Payer: Self-pay

## 2011-12-12 ENCOUNTER — Encounter (HOSPITAL_COMMUNITY): Payer: Self-pay

## 2011-12-12 ENCOUNTER — Encounter (HOSPITAL_COMMUNITY)
Admission: RE | Admit: 2011-12-12 | Discharge: 2011-12-12 | Disposition: A | Discharge status: Home / Self Care | Payer: Medicare Other | Source: Ambulatory Visit | Attending: Orthopedic Surgery | Admitting: Orthopedic Surgery

## 2011-12-12 ENCOUNTER — Ambulatory Visit (HOSPITAL_COMMUNITY)
Admission: RE | Admit: 2011-12-12 | Discharge: 2011-12-12 | Disposition: A | Discharge status: Home / Self Care | Payer: Medicare Other | Source: Ambulatory Visit | Attending: Orthopedic Surgery | Admitting: Orthopedic Surgery

## 2011-12-12 DIAGNOSIS — I10 Essential (primary) hypertension: Secondary | ICD-10-CM | POA: Insufficient documentation

## 2011-12-12 DIAGNOSIS — J984 Other disorders of lung: Secondary | ICD-10-CM | POA: Insufficient documentation

## 2011-12-12 DIAGNOSIS — Z01818 Encounter for other preprocedural examination: Secondary | ICD-10-CM | POA: Insufficient documentation

## 2011-12-12 DIAGNOSIS — Z01812 Encounter for preprocedural laboratory examination: Secondary | ICD-10-CM | POA: Insufficient documentation

## 2011-12-12 HISTORY — DX: Anxiety disorder, unspecified: F41.9

## 2011-12-12 LAB — BASIC METABOLIC PANEL
BUN: 17 mg/dL (ref 6–23)
Chloride: 101 mEq/L (ref 96–112)
Creatinine, Ser: 0.6 mg/dL (ref 0.50–1.10)
Glucose, Bld: 83 mg/dL (ref 70–99)
Potassium: 3.9 mEq/L (ref 3.5–5.1)

## 2011-12-12 LAB — DIFFERENTIAL
Basophils Relative: 0 % (ref 0–1)
Eosinophils Absolute: 0 10*3/uL (ref 0.0–0.7)
Lymphs Abs: 16.8 10*3/uL — ABNORMAL HIGH (ref 0.7–4.0)
Monocytes Absolute: 0.8 10*3/uL (ref 0.1–1.0)
Neutro Abs: 2.9 10*3/uL (ref 1.7–7.7)

## 2011-12-12 LAB — URINALYSIS, ROUTINE W REFLEX MICROSCOPIC
Bilirubin Urine: NEGATIVE
Glucose, UA: NEGATIVE mg/dL
Hgb urine dipstick: NEGATIVE
Nitrite: NEGATIVE
Specific Gravity, Urine: 1.023 (ref 1.005–1.030)
pH: 6 (ref 5.0–8.0)

## 2011-12-12 LAB — SURGICAL PCR SCREEN: Staphylococcus aureus: NEGATIVE

## 2011-12-12 LAB — URINE MICROSCOPIC-ADD ON

## 2011-12-12 LAB — CBC
HCT: 46.5 % — ABNORMAL HIGH (ref 36.0–46.0)
Hemoglobin: 15.2 g/dL — ABNORMAL HIGH (ref 12.0–15.0)
MCH: 30 pg (ref 26.0–34.0)
MCHC: 32.7 g/dL (ref 30.0–36.0)
RDW: 13.8 % (ref 11.5–15.5)

## 2011-12-12 LAB — PATHOLOGIST SMEAR REVIEW

## 2011-12-12 NOTE — Patient Instructions (Signed)
20 Abigail Moreno  12/12/2011   Your procedure is scheduled on:  12/26/11 1200noon-130pm  Report to [facility] at 0930 AM.  Call this number if you have problems the morning of surgery: [phone]   Remember:   Do not eat food:After Midnight.  May have clear liquids:until Midnight .   Take these medicines the morning of surgery with A SIP OF WATER:   Do not wear jewelry, make-up or nail polish.  Do not wear lotions, powders, or perfumes.   Do not shave 48 hours prior to surgery.  Do not bring valuables to the hospital.  Contacts, dentures or bridgework may not be worn into surgery.  Leave suitcase in the car. After surgery it may be brought to your room.  For patients admitted to the hospital, checkout time is 11:00 AM the day of discharge.    Special Instructions: CHG Shower Use Special Wash: 1/2 bottle night before surgery and 1/2 bottle morning of surgery. shower chin to toes with CHG.  Wash face and private parts with regular soap.     Please read over the following fact sheets that you were given: MRSA Information and Surgical Site Infection Prevention , coughing and deep breathing exercises, leg exercises, Incentive Spirometry Fact sheet, Blood Transfusion Fact Sheet

## 2011-12-12 NOTE — Pre-Procedure Instructions (Signed)
12/05/11 EKG on chart  Clearance note on chart from Dr Darrold Span 11/17/11 LOV note Dr Clelia Croft on chart  10/20/11 LOV note Dr Darrold Span on chart

## 2011-12-12 NOTE — Pre-Procedure Instructions (Signed)
12/12/11 Abigail Moreno , PA paged and made aware of abnormal labs.  Abigail Moreno stated he would look at labs.

## 2011-12-17 NOTE — H&P (Signed)
Abigail Moreno is an 78 y.o. female.    Chief Complaint: right hip OA and pain   HPI: Pt is a 78 y.o. female complaining of right hip pain for 2-3 years. Pain had continually increased since the beginning. X-rays in the clinic show end-stage arthritic changes of the right hip. Pt has tried various conservative treatments which have failed to alleviate their symptoms. Various options are discussed with the patient. Risks, benefits and expectations were discussed with the patient. Patient understand the risks, benefits and expectations and wishes to proceed with surgery.   PCP:  Lupita Raider, MD, MD  D/C Plans:  Home with HHPT  Post-op Meds:  Rx given for Xarelto, Robaxin, Iron, Colace and MiraLax  Tranexamic Acid:   Not to be given  Decadron:   Not to be given  PMH: Past Medical History  Diagnosis Date  . Breast cancer FEBRUARY 2004    T1, NO, ER/PR POSITIVE LEFT BREAST  . CLL (chronic lymphocytic leukemia) 2004  . Hypertension   . Hyperlipemia   . Blood transfusion     hx of   . Arthritis of knee  FALL 2012    RIGHT KNEE and hips   . Anxiety     PSH: Past Surgical History  Procedure Date  . Mastectomy partial / lumpectomy w/ axillary lymphadenectomy FEBRUARY 2004    LEFT BREAST  . Abdominal hysterectomy 1972    UNILATERAL OOPHORECTOMY( LEFT)  . Appendectomy   . Incontinence surgery   . Eye surgery     left eye surgery   . Other surgical history     cysts removed from breasts     Social History:  reports that she quit smoking about 53 years ago. She has never used smokeless tobacco. She reports that she drinks alcohol. She reports that she does not use illicit drugs.  Allergies:  Allergies  Allergen Reactions  . Novocain Swelling  . Sulfa Antibiotics Other (See Comments)    Bacteria in intestines     Medications: No current facility-administered medications for this encounter.   Current Outpatient Prescriptions  Medication Sig Dispense Refill  .  ALPRAZolam (XANAX) 0.25 MG tablet Take 0.25 mg by mouth every 8 (eight) hours as needed.      Marland Kitchen amLODipine-benazepril (LOTREL) 5-20 MG per capsule Take 1 capsule by mouth daily.      . Calcium Carbonate-Vitamin D (CALCIUM 600 + D PO) Take 1 tablet by mouth 2 (two) times daily.      . Cinnamon 500 MG capsule Take 500 mg by mouth 2 (two) times daily.      Marland Kitchen HYDROcodone-acetaminophen (VICODIN) 5-500 MG per tablet Take 5-500 mg by mouth Every 6 hours as needed. pain      . MULTIPLE VITAMIN PO Take 1 tablet by mouth daily.      . Multiple Vitamins-Minerals (ICAPS) CAPS Take 2 capsules by mouth 2 (two) times daily.      . OMEGA 3 1200 MG CAPS Take 1 capsule by mouth 3 (three) times daily.        ROS: Review of Systems  Constitutional: Negative.   HENT: Negative.   Eyes: Negative.   Respiratory: Negative.   Cardiovascular: Negative.   Gastrointestinal: Negative.   Genitourinary: Positive for frequency.  Musculoskeletal: Positive for back pain and joint pain.  Skin: Negative.   Neurological: Negative.   Endo/Heme/Allergies: Negative.   Psychiatric/Behavioral: Negative.      Physical Exam: Physical Exam  Constitutional: She is oriented to person, place,  and time and well-developed, well-nourished, and in no distress.  HENT:  Head: Normocephalic and atraumatic.  Nose: Nose normal.  Mouth/Throat: Oropharynx is clear and moist.  Eyes: Pupils are equal, round, and reactive to light.  Neck: Neck supple. No JVD present. No tracheal deviation present. No thyromegaly present.  Cardiovascular: Normal rate, regular rhythm, normal heart sounds and intact distal pulses.   Pulmonary/Chest: Effort normal and breath sounds normal. No stridor.  Abdominal: Soft. There is no tenderness. There is no guarding.  Musculoskeletal:       Right hip: She exhibits decreased range of motion, decreased strength, tenderness and bony tenderness. She exhibits no swelling, no crepitus, no deformity and no laceration.    Lymphadenopathy:    She has no cervical adenopathy.  Neurological: She is alert and oriented to person, place, and time.  Skin: Skin is warm and dry.  Psychiatric: Affect normal.     Assessment/Plan Assessment: right hip OA and pain   Plan: Patient will undergo a right total hip arthroplasty, anterior approach on 12/26/2011 per Dr. Charlann Boxer at [facility]. Risks benefits and expectation were discussed with the patient. Patient understand risks, benefits and expectation and wishes to proceed.   Anastasio Auerbach Mahalie Kanner   PAC  12/17/2011, 9:31 PM

## 2011-12-26 ENCOUNTER — Encounter (HOSPITAL_COMMUNITY): Payer: Self-pay | Admitting: Anesthesiology

## 2011-12-26 ENCOUNTER — Encounter (HOSPITAL_COMMUNITY)
Admission: RE | Disposition: A | Discharge status: Home Health | Payer: Self-pay | Source: Ambulatory Visit | Attending: Orthopedic Surgery

## 2011-12-26 ENCOUNTER — Encounter (HOSPITAL_COMMUNITY): Payer: Self-pay | Admitting: *Deleted

## 2011-12-26 ENCOUNTER — Ambulatory Visit (HOSPITAL_COMMUNITY): Payer: Medicare Other

## 2011-12-26 ENCOUNTER — Inpatient Hospital Stay (HOSPITAL_COMMUNITY)
Admission: RE | Admit: 2011-12-26 | Discharge: 2011-12-28 | DRG: 470 | Disposition: A | Discharge status: Home Health | Payer: Medicare Other | Source: Ambulatory Visit | Attending: Orthopedic Surgery | Admitting: Orthopedic Surgery

## 2011-12-26 ENCOUNTER — Ambulatory Visit (HOSPITAL_COMMUNITY): Payer: Medicare Other | Admitting: Anesthesiology

## 2011-12-26 DIAGNOSIS — Z853 Personal history of malignant neoplasm of breast: Secondary | ICD-10-CM

## 2011-12-26 DIAGNOSIS — I10 Essential (primary) hypertension: Secondary | ICD-10-CM | POA: Diagnosis present

## 2011-12-26 DIAGNOSIS — E785 Hyperlipidemia, unspecified: Secondary | ICD-10-CM | POA: Diagnosis present

## 2011-12-26 DIAGNOSIS — Z9010 Acquired absence of unspecified breast and nipple: Secondary | ICD-10-CM

## 2011-12-26 DIAGNOSIS — F411 Generalized anxiety disorder: Secondary | ICD-10-CM | POA: Diagnosis present

## 2011-12-26 DIAGNOSIS — M1610 Unilateral primary osteoarthritis, unspecified hip: Principal | ICD-10-CM | POA: Diagnosis present

## 2011-12-26 DIAGNOSIS — M169 Osteoarthritis of hip, unspecified: Principal | ICD-10-CM | POA: Diagnosis present

## 2011-12-26 DIAGNOSIS — C9110 Chronic lymphocytic leukemia of B-cell type not having achieved remission: Secondary | ICD-10-CM | POA: Diagnosis present

## 2011-12-26 DIAGNOSIS — Z96649 Presence of unspecified artificial hip joint: Secondary | ICD-10-CM

## 2011-12-26 HISTORY — PX: TOTAL HIP ARTHROPLASTY: SHX124

## 2011-12-26 LAB — ABO/RH: ABO/RH(D): A POS

## 2011-12-26 LAB — TYPE AND SCREEN
ABO/RH(D): A POS
Antibody Screen: NEGATIVE

## 2011-12-26 SURGERY — ARTHROPLASTY, HIP, TOTAL, ANTERIOR APPROACH
Anesthesia: General | Site: Hip | Laterality: Right | Wound class: Clean

## 2011-12-26 MED ORDER — PROPOFOL 10 MG/ML IV BOLUS
INTRAVENOUS | Status: DC | PRN
  Administered 2011-12-26: 160 mg via INTRAVENOUS

## 2011-12-26 MED ORDER — BENAZEPRIL HCL 20 MG PO TABS
20.0000 mg | ORAL_TABLET | Freq: Every day | ORAL | Status: DC
  Administered 2011-12-26: 20 mg via ORAL
  Filled 2011-12-26 (×3): qty 1

## 2011-12-26 MED ORDER — PROMETHAZINE HCL 25 MG/ML IJ SOLN: 6.2500 mg | INTRAMUSCULAR | Status: DC | PRN

## 2011-12-26 MED ORDER — FERROUS SULFATE 325 (65 FE) MG PO TABS
325.0000 mg | ORAL_TABLET | Freq: Three times a day (TID) | ORAL | Status: DC
  Administered 2011-12-27 – 2011-12-28 (×4): 325 mg via ORAL
  Filled 2011-12-26 (×5): qty 1

## 2011-12-26 MED ORDER — LIDOCAINE HCL (CARDIAC) 20 MG/ML IV SOLN
INTRAVENOUS | Status: DC | PRN
  Administered 2011-12-26: 80 mg via INTRAVENOUS
  Administered 2011-12-26: 50 mg via INTRAVENOUS

## 2011-12-26 MED ORDER — CEFAZOLIN SODIUM 1-5 GM-% IV SOLN
INTRAVENOUS | Status: AC
  Filled 2011-12-26: qty 50

## 2011-12-26 MED ORDER — AMLODIPINE BESY-BENAZEPRIL HCL 5-20 MG PO CAPS
1.0000 | ORAL_CAPSULE | Freq: Every day | ORAL | Status: DC
Start: 2011-12-26 — End: 2011-12-26

## 2011-12-26 MED ORDER — MENTHOL 3 MG MT LOZG
1.0000 | LOZENGE | OROMUCOSAL | Status: DC | PRN
  Filled 2011-12-26: qty 9

## 2011-12-26 MED ORDER — DIPHENHYDRAMINE HCL 12.5 MG/5ML PO ELIX: 12.5000 mg | ORAL_SOLUTION | ORAL | Status: DC | PRN

## 2011-12-26 MED ORDER — GLYCOPYRROLATE 0.2 MG/ML IJ SOLN
INTRAMUSCULAR | Status: DC | PRN
  Administered 2011-12-26: 0.6 mg via INTRAVENOUS

## 2011-12-26 MED ORDER — ALUM & MAG HYDROXIDE-SIMETH 200-200-20 MG/5ML PO SUSP: 30.0000 mL | ORAL | Status: DC | PRN

## 2011-12-26 MED ORDER — RIVAROXABAN 10 MG PO TABS
10.0000 mg | ORAL_TABLET | Freq: Every day | ORAL | Status: DC
  Administered 2011-12-27 – 2011-12-28 (×2): 10 mg via ORAL
  Filled 2011-12-26 (×2): qty 1

## 2011-12-26 MED ORDER — DOCUSATE SODIUM 100 MG PO CAPS
100.0000 mg | ORAL_CAPSULE | Freq: Two times a day (BID) | ORAL | Status: DC
  Administered 2011-12-26 – 2011-12-28 (×4): 100 mg via ORAL
  Filled 2011-12-26 (×5): qty 1

## 2011-12-26 MED ORDER — HYDROMORPHONE HCL PF 1 MG/ML IJ SOLN
0.2500 mg | INTRAMUSCULAR | Status: DC | PRN
  Administered 2011-12-26 (×3): 0.5 mg via INTRAVENOUS

## 2011-12-26 MED ORDER — ONDANSETRON HCL 4 MG PO TABS: 4.0000 mg | ORAL_TABLET | Freq: Four times a day (QID) | ORAL | Status: DC | PRN

## 2011-12-26 MED ORDER — POLYETHYLENE GLYCOL 3350 17 G PO PACK
17.0000 g | PACK | Freq: Two times a day (BID) | ORAL | Status: DC
  Administered 2011-12-27: 17 g via ORAL
  Filled 2011-12-26 (×5): qty 1

## 2011-12-26 MED ORDER — CHLORHEXIDINE GLUCONATE 4 % EX LIQD
60.0000 mL | Freq: Once | CUTANEOUS | Status: DC
  Filled 2011-12-26: qty 60

## 2011-12-26 MED ORDER — ROCURONIUM BROMIDE 100 MG/10ML IV SOLN
INTRAVENOUS | Status: DC | PRN
  Administered 2011-12-26: 40 mg via INTRAVENOUS

## 2011-12-26 MED ORDER — CEFAZOLIN SODIUM 1-5 GM-% IV SOLN
1.0000 g | INTRAVENOUS | Status: AC
  Administered 2011-12-26: 1 g via INTRAVENOUS

## 2011-12-26 MED ORDER — HYDROCODONE-ACETAMINOPHEN 7.5-325 MG PO TABS
1.0000 | ORAL_TABLET | ORAL | Status: DC
  Administered 2011-12-26 – 2011-12-28 (×7): 1 via ORAL
  Filled 2011-12-26 (×2): qty 2
  Filled 2011-12-26 (×5): qty 1

## 2011-12-26 MED ORDER — ACETAMINOPHEN 325 MG PO TABS: 650.0000 mg | ORAL_TABLET | Freq: Four times a day (QID) | ORAL | Status: DC | PRN

## 2011-12-26 MED ORDER — 0.9 % SODIUM CHLORIDE (POUR BTL) OPTIME
TOPICAL | Status: DC | PRN
  Administered 2011-12-26: 1000 mL

## 2011-12-26 MED ORDER — ACETAMINOPHEN 10 MG/ML IV SOLN
INTRAVENOUS | Status: AC
  Filled 2011-12-26: qty 100

## 2011-12-26 MED ORDER — ACETAMINOPHEN 650 MG RE SUPP: 650.0000 mg | Freq: Four times a day (QID) | RECTAL | Status: DC | PRN

## 2011-12-26 MED ORDER — LACTATED RINGERS IV SOLN
INTRAVENOUS | Status: DC | PRN
  Administered 2011-12-26 (×3): via INTRAVENOUS

## 2011-12-26 MED ORDER — NEOSTIGMINE METHYLSULFATE 1 MG/ML IJ SOLN
INTRAMUSCULAR | Status: DC | PRN
  Administered 2011-12-26: 4 mg via INTRAVENOUS

## 2011-12-26 MED ORDER — HYDROMORPHONE HCL PF 1 MG/ML IJ SOLN
INTRAMUSCULAR | Status: AC
  Filled 2011-12-26: qty 1

## 2011-12-26 MED ORDER — METOCLOPRAMIDE HCL 10 MG PO TABS: 5.0000 mg | ORAL_TABLET | Freq: Three times a day (TID) | ORAL | Status: DC | PRN

## 2011-12-26 MED ORDER — METOCLOPRAMIDE HCL 5 MG/ML IJ SOLN: 5.0000 mg | Freq: Three times a day (TID) | INTRAMUSCULAR | Status: DC | PRN

## 2011-12-26 MED ORDER — PHENOL 1.4 % MT LIQD
1.0000 | OROMUCOSAL | Status: DC | PRN
  Filled 2011-12-26: qty 177

## 2011-12-26 MED ORDER — ONDANSETRON HCL 4 MG/2ML IJ SOLN
4.0000 mg | Freq: Four times a day (QID) | INTRAMUSCULAR | Status: DC | PRN
  Administered 2011-12-26 – 2011-12-27 (×2): 4 mg via INTRAVENOUS
  Filled 2011-12-26 (×2): qty 2

## 2011-12-26 MED ORDER — DEXAMETHASONE SODIUM PHOSPHATE 10 MG/ML IJ SOLN
10.0000 mg | Freq: Once | INTRAMUSCULAR | Status: AC
  Administered 2011-12-27: 10 mg via INTRAVENOUS
  Filled 2011-12-26: qty 1

## 2011-12-26 MED ORDER — AMLODIPINE BESYLATE 5 MG PO TABS
5.0000 mg | ORAL_TABLET | Freq: Every day | ORAL | Status: DC
  Administered 2011-12-26: 5 mg via ORAL
  Filled 2011-12-26 (×3): qty 1

## 2011-12-26 MED ORDER — ACETAMINOPHEN 10 MG/ML IV SOLN
INTRAVENOUS | Status: DC | PRN
  Administered 2011-12-26: 1000 mg via INTRAVENOUS

## 2011-12-26 MED ORDER — ADULT MULTIVITAMIN W/MINERALS CH
1.0000 | ORAL_TABLET | Freq: Every day | ORAL | Status: DC
  Administered 2011-12-26 – 2011-12-28 (×3): 1 via ORAL
  Filled 2011-12-26 (×3): qty 1

## 2011-12-26 MED ORDER — AMLODIPINE BESYLATE 5 MG PO TABS
5.0000 mg | ORAL_TABLET | Freq: Once | ORAL | Status: AC
  Administered 2011-12-26: 5 mg via ORAL
  Filled 2011-12-26: qty 1

## 2011-12-26 MED ORDER — ALPRAZOLAM 0.25 MG PO TABS: 0.2500 mg | ORAL_TABLET | Freq: Three times a day (TID) | ORAL | Status: DC | PRN

## 2011-12-26 MED ORDER — FLEET ENEMA 7-19 GM/118ML RE ENEM: 1.0000 | ENEMA | Freq: Once | RECTAL | Status: AC | PRN

## 2011-12-26 MED ORDER — LACTATED RINGERS IV BOLUS (SEPSIS)
250.0000 mL | Freq: Once | INTRAVENOUS | Status: AC
  Administered 2011-12-26: 250 mL via INTRAVENOUS

## 2011-12-26 MED ORDER — HYDROMORPHONE HCL PF 1 MG/ML IJ SOLN: 0.5000 mg | INTRAMUSCULAR | Status: DC | PRN

## 2011-12-26 MED ORDER — SODIUM CHLORIDE 0.9 % IV SOLN
INTRAVENOUS | Status: DC
  Administered 2011-12-26 – 2011-12-27 (×3): via INTRAVENOUS
  Filled 2011-12-26 (×9): qty 1000

## 2011-12-26 MED ORDER — METHOCARBAMOL 500 MG PO TABS
500.0000 mg | ORAL_TABLET | Freq: Four times a day (QID) | ORAL | Status: DC | PRN
  Administered 2011-12-27 – 2011-12-28 (×3): 500 mg via ORAL
  Filled 2011-12-26 (×3): qty 1

## 2011-12-26 MED ORDER — METHOCARBAMOL 100 MG/ML IJ SOLN
500.0000 mg | Freq: Four times a day (QID) | INTRAVENOUS | Status: DC | PRN
  Administered 2011-12-26: 500 mg via INTRAVENOUS
  Filled 2011-12-26: qty 5

## 2011-12-26 MED ORDER — ONDANSETRON HCL 4 MG/2ML IJ SOLN
INTRAMUSCULAR | Status: DC | PRN
  Administered 2011-12-26: 4 mg via INTRAVENOUS

## 2011-12-26 MED ORDER — FENTANYL CITRATE 0.05 MG/ML IJ SOLN
INTRAMUSCULAR | Status: DC | PRN
  Administered 2011-12-26 (×2): 50 ug via INTRAVENOUS
  Administered 2011-12-26: 100 ug via INTRAVENOUS

## 2011-12-26 MED ORDER — CEFAZOLIN SODIUM 1-5 GM-% IV SOLN
1.0000 g | Freq: Four times a day (QID) | INTRAVENOUS | Status: AC
  Administered 2011-12-26 – 2011-12-27 (×3): 1 g via INTRAVENOUS
  Filled 2011-12-26 (×3): qty 50

## 2011-12-26 SURGICAL SUPPLY — 40 items
ADH SKN CLS APL DERMABOND .7 (GAUZE/BANDAGES/DRESSINGS) ×1
BAG SPEC THK2 15X12 ZIP CLS (MISCELLANEOUS) ×2
BAG ZIPLOCK 12X15 (MISCELLANEOUS) ×4 IMPLANT
BLADE SAW SGTL 18X1.27X75 (BLADE) ×2 IMPLANT
CELLS DAT CNTRL 66122 CELL SVR (MISCELLANEOUS) ×1 IMPLANT
CLOTH BEACON ORANGE TIMEOUT ST (SAFETY) ×2 IMPLANT
DERMABOND ADVANCED (GAUZE/BANDAGES/DRESSINGS) ×1
DERMABOND ADVANCED .7 DNX12 (GAUZE/BANDAGES/DRESSINGS) ×1 IMPLANT
DRAPE C-ARM 42X72 X-RAY (DRAPES) ×2 IMPLANT
DRAPE STERI IOBAN 125X83 (DRAPES) ×2 IMPLANT
DRAPE U-SHAPE 47X51 STRL (DRAPES) ×6 IMPLANT
DRSG AQUACEL AG ADV 3.5X10 (GAUZE/BANDAGES/DRESSINGS) ×2 IMPLANT
DRSG TEGADERM 4X4.75 (GAUZE/BANDAGES/DRESSINGS) ×2 IMPLANT
DURAPREP 26ML APPLICATOR (WOUND CARE) ×2 IMPLANT
ELECT BLADE TIP CTD 4 INCH (ELECTRODE) ×2 IMPLANT
ELECT REM PT RETURN 9FT ADLT (ELECTROSURGICAL) ×2
ELECTRODE REM PT RTRN 9FT ADLT (ELECTROSURGICAL) ×1 IMPLANT
EVACUATOR 1/8 PVC DRAIN (DRAIN) IMPLANT
FACESHIELD LNG OPTICON STERILE (SAFETY) ×8 IMPLANT
GAUZE SPONGE 2X2 8PLY STRL LF (GAUZE/BANDAGES/DRESSINGS) ×1 IMPLANT
GLOVE BIOGEL PI IND STRL 7.5 (GLOVE) ×1 IMPLANT
GLOVE BIOGEL PI IND STRL 8 (GLOVE) ×1 IMPLANT
GLOVE BIOGEL PI INDICATOR 7.5 (GLOVE) ×1
GLOVE BIOGEL PI INDICATOR 8 (GLOVE) ×1
GLOVE ECLIPSE 8.0 STRL XLNG CF (GLOVE) ×2 IMPLANT
GLOVE ORTHO TXT STRL SZ7.5 (GLOVE) ×4 IMPLANT
GOWN BRE IMP PREV XXLGXLNG (GOWN DISPOSABLE) ×4 IMPLANT
GOWN STRL NON-REIN LRG LVL3 (GOWN DISPOSABLE) ×2 IMPLANT
KIT BASIN OR (CUSTOM PROCEDURE TRAY) ×2 IMPLANT
PACK TOTAL JOINT (CUSTOM PROCEDURE TRAY) ×2 IMPLANT
PADDING CAST COTTON 6X4 STRL (CAST SUPPLIES) ×2 IMPLANT
RTRCTR WOUND ALEXIS 18CM MED (MISCELLANEOUS) ×2
SPONGE GAUZE 2X2 STER 10/PKG (GAUZE/BANDAGES/DRESSINGS) ×1
SUCTION FRAZIER 12FR DISP (SUCTIONS) ×2 IMPLANT
SUT MNCRL AB 4-0 PS2 18 (SUTURE) ×2 IMPLANT
SUT VIC AB 1 CT1 36 (SUTURE) ×8 IMPLANT
SUT VIC AB 2-0 CT1 27 (SUTURE) ×2
SUT VIC AB 2-0 CT1 TAPERPNT 27 (SUTURE) ×2 IMPLANT
TOWEL OR 17X26 10 PK STRL BLUE (TOWEL DISPOSABLE) ×4 IMPLANT
TRAY FOLEY CATH 14FRSI W/METER (CATHETERS) ×2 IMPLANT

## 2011-12-26 NOTE — Op Note (Signed)
NAME:  Abigail Moreno                ACCOUNT NO.: [id number]      MEDICAL RECORD NO.: [id number]      FACILITY:  [facility]      PHYSICIAN:  Kiara Keep D  DATE OF BIRTH:  August 15, 1929     DATE OF PROCEDURE:  12/26/2011                                 OPERATIVE REPORT         PREOPERATIVE DIAGNOSIS: Right  hip osteoarthritis.      POSTOPERATIVE DIAGNOSIS:  Right hip osteoarthritis.      PROCEDURE:  Right total hip replacement through an anterior approach   utilizing DePuy THR system, component size 52 pinnacle cup, a size 36+4 neutral   Altrex liner, a size 4 standard Tri Lock stem with a 36+1.5 aSphere metal    ball.      SURGEON:  Madlyn Frankel. Charlann Boxer, M.D.      ASSISTANT:  Lanney Gins, PA      ANESTHESIA:  General.      SPECIMENS:  None.      COMPLICATIONS:  None.      BLOOD LOSS:  450 cc     DRAINS:  One Hemovac.      INDICATION OF THE PROCEDURE:  Abigail Moreno is a 78 y.o. female who had   presented to office for evaluation of right hip pain.  Radiographs revealed   progressive degenerative changes with bone-on-bone   articulation to the  hip joint.  The patient had painful limited range of   motion significantly affecting their overall quality of life.  The patient was failing to    respond to conservative measures, and at this point was ready   to proceed with more definitive measures.  The patient has noted progressive   degenerative changes in his hip, progressive problems and dysfunction   with regarding the hip prior to surgery.  Consent was obtained for   benefit of pain relief.  Specific risk of infection, DVT, component   failure, dislocation, need for revision surgery, as well discussion of   the anterior versus posterior approach were reviewed.  Consent was   obtained for benefit of anterior pain relief through an anterior   approach.      PROCEDURE IN DETAIL:  The patient was brought to operative theater.   Once adequate anesthesia, preoperative  antibiotics, 2gm Ancef administered.   The patient was positioned supine on the OSI Hanna table.  Once adequate   padding of boney process was carried out, we had predraped out the hip, and  used fluoroscopy to confirm orientation of the pelvis and position.      The right hip was then prepped and draped from proximal iliac crest to   mid thigh with shower curtain technique.      Time-out was performed identifying the patient, planned procedure, and   extremity.     An incision was then made 2 cm distal and lateral to the   anterior superior iliac spine extending over the orientation of the   tensor fascia lata muscle and sharp dissection was carried down to the   fascia of the muscle and protractor placed in the soft tissues.      The fascia was then incised.  The muscle belly was identified and swept  laterally and retractor placed along the superior neck.  Following   cauterization of the circumflex vessels and removing some pericapsular   fat, a second cobra retractor was placed on the inferior neck.  A third   retractor was placed on the anterior acetabulum after elevating the   anterior rectus.  A L-capsulotomy was along the line of the   superior neck to the trochanteric fossa, then extended proximally and   distally.  Tag sutures were placed and the retractors were then placed   intracapsular.  We then identified the trochanteric fossa and   orientation of my neck cut, confirmed this radiographically   and then made a neck osteotomy with the femur on traction.  The femoral   head was removed without difficulty or complication.  Traction was let   off and retractors were placed posterior and anterior around the   acetabulum.      The labrum and foveal tissue were debrided.  I began reaming with a 45   reamer and reamed up to 51mm reamer with good bony bed preparation and a 52   cup was chosen.  The final 52mm Pinnacle cup was then impacted under fluoroscopy  to confirm the  depth of penetration and orientation with respect to   abduction.  A screw was placed followed by the hole eliminator.  The final   36+4 Altrex liner was impacted with good visualized rim fit.  The cup was positioned anatomically within the acetabular portion of the pelvis.      At this point, the femur was rolled at 80 degrees.  Further capsule was   released off the inferior aspect of the femoral neck.  I then   released the superior capsule proximally.  The hook was placed laterally   along the femur and elevated manually and held in position with the bed   hook.  The leg was then extended and adducted with the leg rolled to 100   degrees of external rotation.  Once the proximal femur was fully   exposed, I used a box osteotome to set orientation.  I then began   broaching with the starting chili pepper broach and passed this by hand and then broached up to 4.  With the 4 broach in place I chose a standard neck, and a 36+1.5 ball and did a trial reduction.  The offset was appropriate, leg lengths   appeared to be equal, confirmed radiographically.   Given these findings, I went ahead and dislocated the hip, repositioned all   retractors and positioned the right hip in the extended and abducted position.  The final 4 standard  Tri Lock stem was   chosen and it was impacted down to the level of neck cut.  Based on this   and the trial reduction, a 36+1.5 aSphere metal ball was chosen and   impacted onto a clean and dry trunnion, and the hip was reduced.  The   hip had been irrigated throughout the case again at this point.  I did   reapproximate the superior capsular leaflet to the anterior leaflet   using #1 Vicryl, placed a medium Hemovac drain deep.  The fascia of the   tensor fascia lata muscle was then reapproximated using #1 Vicryl.  The   remaining wound was closed with 2-0 Vicryl and running 4-0 Monocryl.   The hip was cleaned, dried, and dressed sterilely using Dermabond and    Aquacel dressing.  Drain site dressed separately.  She was then brought   to recovery room in stable condition tolerating the procedure well.    Danae Orleans, PA-C was present for the entirety of the case involved from   preoperative positioning, perioperative retractor management, general   facilitation of the case, as well as primary wound closure as assistant.            Pietro Cassis Alvan Dame, M.D.            MDO/MEDQ  D:  07/11/2011  T:  07/11/2011  Job:  [id number]      Electronically Signed by Paralee Cancel M.D. on 07/17/2011 09:15:38 AM

## 2011-12-26 NOTE — Progress Notes (Signed)
X-RAY results noted 

## 2011-12-26 NOTE — Progress Notes (Signed)
Injection to both eyes for macular degeneration 12/25/2011

## 2011-12-26 NOTE — Transfer of Care (Signed)
Immediate Anesthesia Transfer of Care Note  Patient: Abigail Moreno  Procedure(s) Performed: Procedure(s) (LRB): TOTAL HIP ARTHROPLASTY ANTERIOR APPROACH (Right)  Patient Location: PACU  Anesthesia Type: General  Level of Consciousness: awake, alert  and oriented  Airway & Oxygen Therapy: Patient Spontanous Breathing and Patient connected to face mask oxygen  Post-op Assessment: Report given to PACU RN and Post -op Vital signs reviewed and stable  Post vital signs: Reviewed and stable  Complications: No apparent anesthesia complications

## 2011-12-26 NOTE — Progress Notes (Signed)
Portable AP Pelvis and Lateral Right Hip X-RAYS done 

## 2011-12-26 NOTE — Anesthesia Postprocedure Evaluation (Signed)
  Anesthesia Post-op Note  Patient: Abigail Moreno  Procedure(s) Performed: Procedure(s) (LRB): TOTAL HIP ARTHROPLASTY ANTERIOR APPROACH (Right)  Patient Location: PACU  Anesthesia Type: General  Level of Consciousness: awake and alert   Airway and Oxygen Therapy: Patient Spontanous Breathing  Post-op Pain: mild  Post-op Assessment: Post-op Vital signs reviewed, Patient's Cardiovascular Status Stable, Respiratory Function Stable, Patent Airway and No signs of Nausea or vomiting  Post-op Vital Signs: stable  Complications: No apparent anesthesia complications

## 2011-12-26 NOTE — Anesthesia Preprocedure Evaluation (Addendum)
Anesthesia Evaluation  Patient identified by MRN, date of birth, ID band Patient awake  General Assessment Comment:H/o CLL, breast cancer.  Reviewed: Allergy & Precautions, H&P , NPO status , Patient's Chart, lab work & pertinent test results  Airway Mallampati: II TM Distance: >3 FB Neck ROM: Full    Dental No notable dental hx.    Pulmonary former smoker breath sounds clear to auscultation  Pulmonary exam normal       Cardiovascular hypertension, Pt. on medications Rhythm:Regular Rate:Normal     Neuro/Psych Anxiety negative neurological ROS     GI/Hepatic negative GI ROS, Neg liver ROS,   Endo/Other  negative endocrine ROS  Renal/GU negative Renal ROS  negative genitourinary   Musculoskeletal negative musculoskeletal ROS (+)   Abdominal   Peds negative pediatric ROS (+)  Hematology negative hematology ROS (+)   Anesthesia Other Findings   Reproductive/Obstetrics negative OB ROS                           Anesthesia Physical Anesthesia Plan  ASA: III  Anesthesia Plan: General   Post-op Pain Management:    Induction: Intravenous  Airway Management Planned: Oral ETT  Additional Equipment:   Intra-op Plan:   Post-operative Plan: Extubation in OR  Informed Consent: I have reviewed the patients History and Physical, chart, labs and discussed the procedure including the risks, benefits and alternatives for the proposed anesthesia with the patient or authorized representative who has indicated his/her understanding and acceptance.   Dental advisory given  Plan Discussed with: CRNA  Anesthesia Plan Comments:        Anesthesia Quick Evaluation

## 2011-12-26 NOTE — Interval H&P Note (Signed)
History and Physical Interval Note:  12/26/2011 10:26 AM  Abigail Moreno  has presented today for surgery, with the diagnosis of Osteoarthritis of the Right Hip  The various methods of treatment have been discussed with the patient and family. After consideration of risks, benefits and other options for treatment, the patient has consented to  Procedure(s) (LRB): RIGHT TOTAL HIP ARTHROPLASTY ANTERIOR APPROACH (Right) as a surgical intervention .  The patients' history has been reviewed, patient examined, no change in status, stable for surgery.  I have reviewed the patients' chart and labs.  Questions were answered to the patient's satisfaction.     Shelda Pal

## 2011-12-27 LAB — CBC
HCT: 32.3 % — ABNORMAL LOW (ref 36.0–46.0)
MCHC: 32.8 g/dL (ref 30.0–36.0)
MCV: 91 fL (ref 78.0–100.0)
RDW: 13.5 % (ref 11.5–15.5)

## 2011-12-27 LAB — BASIC METABOLIC PANEL
BUN: 11 mg/dL (ref 6–23)
Creatinine, Ser: 0.47 mg/dL — ABNORMAL LOW (ref 0.50–1.10)
GFR calc Af Amer: >90 mL/min (ref >90)
GFR calc non Af Amer: >90 mL/min (ref >90)

## 2011-12-27 NOTE — Evaluation (Signed)
Occupational Therapy Evaluation Patient Details Name: Abigail Moreno MRN: [id number] DOB: 1929/02/06 Today's Date: 12/27/2011  Problem List:  Patient Active Problem List  Diagnoses  . CLL (chronic lymphocytic leukemia)  . Breast cancer  . S/P right THA, AA    Past Medical History:  Past Medical History  Diagnosis Date  . Breast cancer FEBRUARY 2004    T1, NO, ER/PR POSITIVE LEFT BREAST  . CLL (chronic lymphocytic leukemia) 2004  . Hypertension   . Hyperlipemia   . Blood transfusion     hx of   . Arthritis of knee  FALL 2012    RIGHT KNEE and hips   . Anxiety    Past Surgical History:  Past Surgical History  Procedure Date  . Mastectomy partial / lumpectomy w/ axillary lymphadenectomy FEBRUARY 2004    LEFT BREAST  . Abdominal hysterectomy 1972    UNILATERAL OOPHORECTOMY( LEFT)  . Appendectomy   . Incontinence surgery   . Eye surgery     left eye surgery   . Other surgical history     cysts removed from breasts     OT Assessment/Plan/Recommendation OT Assessment Clinical Impression Statement: Pt doing well POD#1 RTHR anterior approach. Skilled OT recommended to maximize I w/BADLs in prep for safe d/c home with prn A from husband and HHOT, OT Recommendation/Assessment: Patient will need skilled OT in the acute care venue OT Problem List: Decreased activity tolerance;Decreased knowledge of use of DME or AE OT Therapy Diagnosis : Generalized weakness OT Plan OT Frequency: Min 2X/week OT Treatment/Interventions: Self-care/ADL training;Therapeutic activities;DME and/or AE instruction;Patient/family education OT Recommendation Follow Up Recommendations: Home health OT Equipment Recommended: 3 in 1 bedside comode Individuals Consulted Consulted and Agree with Results and Recommendations: Patient OT Goals Acute Rehab OT Goals OT Goal Formulation: With patient/family Time For Goal Achievement: 2 weeks ADL Goals Pt Will Perform Grooming: with modified  independence;Standing at sink ADL Goal: Grooming - Progress: Goal set today Pt Will Transfer to Toilet: Ambulation;with supervision;3-in-1 ADL Goal: Toilet Transfer - Progress: Goal set today Pt Will Perform Toileting - Clothing Manipulation: with supervision;Standing ADL Goal: Toileting - Clothing Manipulation - Progress: Goal set today Pt Will Perform Toileting - Hygiene: with supervision;Sit to stand from 3-in-1/toilet ADL Goal: Toileting - Hygiene - Progress: Goal set today Pt Will Perform Tub/Shower Transfer: Shower transfer;with supervision;Ambulation ADL Goal: Tub/Shower Transfer - Progress: Goal set today  OT Evaluation Precautions/Restrictions  Restrictions Weight Bearing Restrictions: No Prior Functioning Home Living Lives With: Spouse Receives Help From: Family Type of Home: House Home Layout: One level Home Access: Stairs to enter Entrance Stairs-Rails: None Entrance Stairs-Number of Steps: 1 Bathroom Shower/Tub: Health visitor: Handicapped height How Accessible: Other (comment) (either go sideways w/RW or hold onto sink/sidestep) Home Adaptive Equipment: Walker - rolling;Shower chair without back Prior Function Level of Independence: Independent with basic ADLs;Independent with homemaking with ambulation Driving: Yes ADL ADL Grooming: Performed;Wash/dry hands;Supervision/safety Where Assessed - Grooming: Standing at sink Upper Body Bathing: Simulated;Supervision/safety Where Assessed - Upper Body Bathing: Standing at sink Lower Body Bathing: Simulated;Minimal assistance Where Assessed - Lower Body Bathing: Sit to stand from chair Upper Body Dressing: Simulated;Supervision/safety Where Assessed - Upper Body Dressing: Standing Lower Body Dressing: Performed;Minimal assistance Where Assessed - Lower Body Dressing: Sit to stand from chair Toilet Transfer: Performed;Minimal assistance Toilet Transfer Details (indicate cue type and reason): simulated  side stepping into the bathroom  Toilet Transfer Method: Ambulating Toilet Transfer Equipment: Raised toilet seat with arms (or 3-in-1 over toilet)  Toileting - Clothing Manipulation: Performed;Minimal assistance Where Assessed - Toileting Clothing Manipulation: Sit to stand from 3-in-1 or toilet Toileting - Hygiene: Performed;Minimal assistance Where Assessed - Toileting Hygiene: Sit to stand from 3-in-1 or toilet Tub/Shower Transfer: Not assessed Tub/Shower Transfer Method: Not assessed (Did discuss w/ pt the safest method to step into shower.) Equipment Used: Rolling walker;Other (comment) (3:1) Vision/Perception  Vision - History Visual History: Macular degeneration Patient Visual Report: No change from baseline Cognition Cognition Arousal/Alertness: Awake/alert Overall Cognitive Status: Appears within functional limits for tasks assessed Orientation Level: Oriented X4 Sensation/Coordination   Extremity Assessment RUE Assessment RUE Assessment: Within Functional Limits LUE Assessment LUE Assessment: Within Functional Limits Mobility  Transfers Sit to Stand: 4: Min assist;With upper extremity assist;With armrests;From chair/3-in-1 Sit to Stand Details (indicate cue type and reason): VCs for hand placement and LE position Stand to Sit: 4: Min assist;With upper extremity assist;To bed;To chair/3-in-1 Stand to Sit Details: VCs for hand placement and LE position Exercises   End of Session OT - End of Session Activity Tolerance: Patient tolerated treatment well Patient left: in bed;with call bell in reach;with family/visitor present General Behavior During Session: [facility] for tasks performed Cognition: [facility] for tasks performed   Abigail Moreno A OTR/L [phone] 12/27/2011, 3:25 PM

## 2011-12-27 NOTE — Progress Notes (Signed)
Physical Therapy Treatment Patient Details Name: Abigail Moreno MRN: [id number] DOB: [birth year] Today's Date: 12/27/2011 [id number] PT Assessment/Plan  PT - Assessment/Plan Comments on Treatment Session: pt tolerated exceptionally wel this PM. spouse present for instruction on mobility Equipment Recommended: 3 in 1 bedside comode PT Goals  Acute Rehab PT Goals Pt will go Sit to Supine/Side: with supervision PT Goal: Sit to Supine/Side - Progress: Goal set today Pt will go Sit to Stand: with supervision PT Goal: Sit to Stand - Progress: Progressing toward goal Pt will go Stand to Sit: with supervision PT Goal: Stand to Sit - Progress: Progressing toward goal Pt will Ambulate: 51 - 150 feet;with rolling walker PT Goal: Ambulate - Progress: Progressing toward goal Pt will Perform Home Exercise Program: with supervision, verbal cues required/provided PT Goal: Perform Home Exercise Program - Progress: Progressing toward goal  PT Treatment Precautions/Restrictions  Restrictions Weight Bearing Restrictions: No Mobility (including Balance) Bed Mobility Sit to Supine: 4: Min assist Sit to Supine - Details (indicate cue type and reason): mion assist to get RLE onto bed. spouse present Transfers Transfers: Yes Sit to Stand: 4: Min assist;With upper extremity assist;With armrests;From chair/3-in-1 Sit to Stand Details (indicate cue type and reason): VCs for hand placement and LE position Stand to Sit: 4: Min assist;With upper extremity assist;To bed;To chair/3-in-1 Stand to Sit Details: VCs for hand placement and LE position Ambulation/Gait Ambulation/Gait Assistance: 4: Min assist Ambulation/Gait Assistance Details (indicate cue type and reason): vc for sequence Ambulation Distance (Feet): 150 Feet Assistive device: Rolling walker Gait Pattern: Step-through pattern    Exercise  Total Joint Exercises Quad Sets: AROM;Right;10 reps Short Arc Quad: AROM;Right;10 reps Heel Slides:  AAROM;Right;10 reps Hip ABduction/ADduction: AAROM;Right;10 reps End of Session PT - End of Session Activity Tolerance: Patient tolerated treatment well Patient left: in bed;with call bell in reach;with family/visitor present Nurse Communication: Mobility status for transfers General Behavior During Session: [facility] for tasks performed Cognition: [facility] for tasks performed  Rada Hay 12/27/2011, 4:22 PM

## 2011-12-27 NOTE — Evaluation (Signed)
Physical Therapy Evaluation Patient Details Name: Abigail Moreno MRN: [id number] DOB: [birth year] Today's Date: 12/27/2011  Problem List:  Patient Active Problem List  Diagnoses  . CLL (chronic lymphocytic leukemia)  . Breast cancer  . S/P right THA, AA    Past Medical History:  Past Medical History  Diagnosis Date  . Breast cancer FEBRUARY 2004    T1, NO, ER/PR POSITIVE LEFT BREAST  . CLL (chronic lymphocytic leukemia) 2004  . Hypertension   . Hyperlipemia   . Blood transfusion     hx of   . Arthritis of knee  FALL 2012    RIGHT KNEE and hips   . Anxiety    Past Surgical History:  Past Surgical History  Procedure Date  . Mastectomy partial / lumpectomy w/ axillary lymphadenectomy FEBRUARY 2004    LEFT BREAST  . Abdominal hysterectomy 1972    UNILATERAL OOPHORECTOMY( LEFT)  . Appendectomy   . Incontinence surgery   . Eye surgery     left eye surgery   . Other surgical history     cysts removed from breasts     PT Assessment/Plan/Recommendation PT Assessment Clinical Impression Statement: pt is s/p RTHA direct ant who tolerated OOB well, c/o light headedness, very sleepy. pt will benefit from PT to improve ROM, strength and mobility to DC home. will need to practice simulated BR technique since RW does not fit well per pt. PT Recommendation/Assessment: Patient will need skilled PT in the acute care venue PT Problem List: Decreased strength;Decreased range of motion;Decreased activity tolerance;Decreased mobility;Decreased knowledge of use of DME PT Therapy Diagnosis : Difficulty walking PT Plan PT Frequency: 7X/week PT Treatment/Interventions: DME instruction;Gait training;Stair training;Functional mobility training;Therapeutic activities;Therapeutic exercise;Patient/family education PT Recommendation Recommendations for Other Services: OT consult Follow Up Recommendations: Home health PT PT Goals  Acute Rehab PT Goals PT Goal Formulation: With  patient/family Time For Goal Achievement: 7 days Pt will go Supine/Side to Sit: with supervision PT Goal: Supine/Side to Sit - Progress: Goal set today Pt will go Sit to Stand: with supervision PT Goal: Sit to Stand - Progress: Goal set today Pt will go Stand to Sit: with supervision PT Goal: Stand to Sit - Progress: Goal set today Pt will Ambulate: 51 - 150 feet;with rolling walker PT Goal: Ambulate - Progress: Goal set today Pt will Go Up / Down Stairs: 1-2 stairs;with min assist PT Goal: Up/Down Stairs - Progress: Goal set today Pt will Perform Home Exercise Program: with supervision, verbal cues required/provided PT Goal: Perform Home Exercise Program - Progress: Goal set today  PT Evaluation Precautions/Restrictions  Restrictions Weight Bearing Restrictions: No Prior Functioning  Home Living Lives With: Spouse Receives Help From: Family Type of Home: House Home Layout: One level Home Access: Stairs to enter Entrance Stairs-Rails: None Entrance Stairs-Number of Steps: 1 Bathroom Toilet: Handicapped height Bathroom Accessibility: Yes How Accessible:  ( either go sideways w/ RW or hold onto sink/sidestep) Home Adaptive Equipment: Walker - rolling Additional Comments: pt states she has practiced stepping sideways into BR while holding onto sink. as RW does not fit inside BR. Prior Function Level of Independence: Independent with basic ADLs Driving: Yes Cognition Cognition Arousal/Alertness: Awake/alert Overall Cognitive Status: Appears within functional limits for tasks assessed Orientation Level: Oriented X4 Sensation/Coordination Sensation Light Touch: Appears Intact (RLE) Coordination Gross Motor Movements are Fluid and Coordinated: Yes Extremity Assessment RLE Assessment RLE Assessment: Exceptions to [facility] RLE AROM (degrees) RLE Overall AROM Comments: flexes to 90 degrees RLE Strength RLE Overall  Strength Comments: able to advance to walk, able to move on  bed LLE Assessment LLE Assessment: Within Functional Limits Mobility (including Balance) Bed Mobility Bed Mobility: Yes Supine to Sit: 4: Min assist;HOB elevated (Comment degrees) Supine to Sit Details (indicate cue type and reason): pt able to move RLE to edge, HOB 50degrees. BVC for technique Transfers Transfers: Yes Sit to Stand: 4: Min assist;With upper extremity assist Sit to Stand Details (indicate cue type and reason): vc to push from bed/RW Stand to Sit: 4: Min assist;To chair/3-in-1;With upper extremity assist Stand to Sit Details: vc to reach back to recliner Stand Pivot Transfers: 4: Min assist Stand Pivot Transfer Details (indicate cue type and reason): from bed to chair to recliner ,able to bear weight on RLE Ambulation/Gait Ambulation/Gait: Yes Ambulation/Gait Assistance: 4: Min assist Ambulation/Gait Assistance Details (indicate cue type and reason): +1 for safety w/ c/o being lightheaded Ambulation Distance (Feet): 5 Feet Assistive device: Rolling walker Gait Pattern: Step-to pattern    Exercise    End of Session PT - End of Session Activity Tolerance: Patient tolerated treatment well Patient left: in chair;with call bell in reach;with family/visitor present Nurse Communication: Mobility status for transfers (for musclerelaxer) General Behavior During Session: [facility] for tasks performed Cognition: [facility] for tasks performed  Rada Hay 12/27/2011, 9:49 AM  [phone]

## 2011-12-27 NOTE — Plan of Care (Signed)
Problem: Phase I Progression Outcomes Goal: Other Phase I Outcomes/Goals Outcome: Completed/Met Date Met:  12/27/11 Xarelto education done.

## 2011-12-27 NOTE — Progress Notes (Signed)
Subjective: 1 Day Post-Op Procedure(s) (LRB): TOTAL HIP ARTHROPLASTY ANTERIOR APPROACH (Right)   Patient reports pain as mild. Pain well controlled. No events throughout the night. Does states that her WBC are usually elevated because of her CLL  Objective:   VITALS:   Filed Vitals:   12/27/11 0519  BP: 111/70  Pulse: 62  Temp: 97.5 F (36.4 C)  Resp: 16    Neurovascular intact Dorsiflexion/Plantar flexion intact Incision: dressing C/D/I No cellulitis present Compartment soft  LABS  Basename 12/27/11 0440  HGB 10.6*  HCT 32.3*  WBC 20.2*  PLT 94*     Basename 12/27/11 0440  NA 133*  K 4.2  BUN 11  CREATININE 0.47*  GLUCOSE 117*     Assessment/Plan: 1 Day Post-Op Procedure(s) (LRB): TOTAL HIP ARTHROPLASTY ANTERIOR APPROACH (Right)   HV drain d/c'ed Foley cath d/c'ed Advance diet Up with therapy Plan for discharge tomorrow to home, if continues to do well.   Anastasio Auerbach Jahzaria Vary   PAC  12/27/2011, 8:47 AM

## 2011-12-28 LAB — CBC
MCHC: 33.7 g/dL (ref 30.0–36.0)
Platelets: 91 10*3/uL — ABNORMAL LOW (ref 150–400)
RDW: 13.8 % (ref 11.5–15.5)

## 2011-12-28 LAB — BASIC METABOLIC PANEL
GFR calc Af Amer: >90 mL/min (ref >90)
GFR calc non Af Amer: 89 mL/min — ABNORMAL LOW (ref >90)
Potassium: 3.7 mEq/L (ref 3.5–5.1)
Sodium: 138 mEq/L (ref 135–145)

## 2011-12-28 MED ORDER — METHOCARBAMOL 500 MG PO TABS: 500.0000 mg | ORAL_TABLET | Freq: Four times a day (QID) | ORAL | Status: AC | PRN

## 2011-12-28 MED ORDER — FERROUS SULFATE 325 (65 FE) MG PO TABS: 325.0000 mg | ORAL_TABLET | Freq: Three times a day (TID) | ORAL | Status: DC

## 2011-12-28 MED ORDER — HYDROCODONE-ACETAMINOPHEN 7.5-325 MG PO TABS: 1.0000 | ORAL_TABLET | ORAL | Status: AC | PRN

## 2011-12-28 MED ORDER — RIVAROXABAN 10 MG PO TABS: 10.0000 mg | ORAL_TABLET | Freq: Every day | ORAL | Status: DC

## 2011-12-28 MED ORDER — POLYETHYLENE GLYCOL 3350 17 G PO PACK: 17.0000 g | PACK | Freq: Two times a day (BID) | ORAL | Status: AC

## 2011-12-28 MED ORDER — DSS 100 MG PO CAPS: 100.0000 mg | ORAL_CAPSULE | Freq: Two times a day (BID) | ORAL | Status: AC

## 2011-12-28 NOTE — Progress Notes (Signed)
Physical Therapy Treatment Patient Details Name: Abigail Moreno MRN: [id number] DOB: 1929/08/16 Today's Date: 12/28/2011 [phone] PT Assessment/Plan  PT - Assessment/Plan Comments on Treatment Session: pt has made excellent progress w/ minimal pain has reached a level to DC home step training, spouse has assisted pt. in/out of bed/ to BR. PT Plan: Discharge plan remains appropriate PT Goals  Acute Rehab PT Goals Pt will go Supine/Side to Sit: with supervision PT Goal: Supine/Side to Sit - Progress: Met Pt will go Sit to Stand: with supervision PT Goal: Sit to Stand - Progress: Met Pt will go Stand to Sit: with supervision PT Goal: Stand to Sit - Progress: Met  PT Treatment Precautions/Restrictions  Restrictions Weight Bearing Restrictions: No Mobility (including Balance) Bed Mobility Supine to Sit: 5: Supervision;HOB flat Supine to Sit Details (indicate cue type and reason): vc to not use rail moving to LEFT, pt states it hurts less to get OOB to Left Transfers Sit to Stand: 5: Supervision;From bed;With upper extremity assist Sit to Stand Details (indicate cue type and reason): vc to push from bed Stand to Sit: 5: Supervision Stand to Sit Details: pt had no difficulty sitting to recliner Ambulation/Gait Ambulation/Gait Assistance: 5: Supervision Ambulation Distance (Feet): 30 Feet Assistive device: Rolling walker Gait Pattern: Step-through pattern Stairs: Yes Stairs Assistance: 4: Min assist Stairs Assistance Details (indicate cue type and reason): spouse present for instruction for step Stair Management Technique: No rails;Forwards;With walker Number of Stairs: 1  Height of Stairs: 6     Exercise  Total Joint Exercises Quad Sets: AROM;Right;10 reps Short Arc Quad: AROM;Right;10 reps Heel Slides: AAROM;Right;10 reps Hip ABduction/ADduction: AAROM;Right;10 reps End of Session PT - End of Session Activity Tolerance: Patient tolerated treatment well Patient left:  in chair;with call bell in reach;with family/visitor present Nurse Communication: Mobility status for transfers General Behavior During Session: [facility] for tasks performed Cognition: [facility] for tasks performed  Rada Hay 12/28/2011, 3:34 PM

## 2011-12-28 NOTE — Progress Notes (Signed)
Subjective: 2 Days Post-Op Procedure(s) (LRB): TOTAL HIP ARTHROPLASTY ANTERIOR APPROACH (Right)   Patient reports pain as mild. Pain well controlled. No events throughout the night. Ready to be discharged home.  Objective:   VITALS:   Filed Vitals:   12/28/11 0907  BP: 111/66  Pulse:   Temp:   Resp:     Neurovascular intact Dorsiflexion/Plantar flexion intact Incision: dressing C/D/I No cellulitis present Compartment soft  LABS  Basename 12/28/11 0420 12/27/11 0440  HGB 10.4* 10.6*  HCT 30.9* 32.3*  WBC 24.1* 20.2*  PLT 91* 94*     Basename 12/28/11 0420 12/27/11 0440  NA 138 133*  K 3.7 4.2  BUN 10 11  CREATININE 0.50 0.47*  GLUCOSE 110* 117*     Assessment/Plan: 2 Days Post-Op Procedure(s) (LRB): TOTAL HIP ARTHROPLASTY ANTERIOR APPROACH (Right)   Up with therapy Discharge home with home health today Follow up in 2 weeks at [facility].  Follow-up Information    Follow up with OLIN,Taffany Heiser D in 2 weeks.   Contact information:   [facility] [address], [address] [place] [address] [phone]          Anastasio Auerbach. Makhiya Coburn   PAC  12/28/2011, 9:16 AM

## 2011-12-28 NOTE — Discharge Summary (Signed)
Physician Discharge Summary  Patient ID: RHYS ANCHONDO MRN: [id number] DOB/AGE: 21-Apr-1929 78 y.o.  Admit date: 12/26/2011 Discharge date: 12/28/2011  Procedures:  Procedure(s) (LRB): TOTAL HIP ARTHROPLASTY ANTERIOR APPROACH (Right)  Attending Physician:  Dr. Durene Romans   Admission Diagnoses: Right hip OA and pain   Discharge Diagnoses:  Principal Problem:  *S/P right THA, AA Hx of breast cancer   CLL (chronic lymphocytic leukemia)  Hypertension   Hyperlipemia   Arthritis of knee   Anxiety   HPI: Pt is a 78 y.o. female complaining of right hip pain for 2-3 years. Pain had continually increased since the beginning. X-rays in the clinic show end-stage arthritic changes of the right hip. Pt has tried various conservative treatments which have failed to alleviate their symptoms. Various options are discussed with the patient. Risks, benefits and expectations were discussed with the patient. Patient understand the risks, benefits and expectations and wishes to proceed with surgery.   PCP: Lupita Raider, MD, MD   Discharged Condition: good  Hospital Course:  Patient underwent the above stated procedure on 12/26/2011. Patient tolerated the procedure well and brought to the recovery room in good condition and subsequently to the floor.  POD #1 BP: 111/70 ; Pulse: 62 ; Temp: 97.5 F (36.4 C) ; Resp: 16  Pt's foley was removed, as well as the hemovac drain removed. IV was changed to a saline lock. Patient reports pain as mild. Pain well controlled. No events throughout the night. Does states that her WBC are usually elevated because of her CLL. Neurovascular intact, dorsiflexion/plantar flexion intact, incision: dressing C/D/I, no cellulitis present and compartment soft.   LABS  Basename  12/27/11 0440   HGB  10.6  HCT  32.3   POD #2  BP: 111/66 ; Pulse: 66 ; Temp: 98.4 F (36.9 C) ; Resp: 16 Patient reports pain as mild. Pain well controlled. No events throughout the night.  Ready to be discharged home. Neurovascular intact, dorsiflexion/plantar flexion intact, incision: dressing C/D/I, no cellulitis present and compartment soft.   LABS  Basename  12/28/11 0420   HGB  10.4  HCT  30.9    Discharge Exam: General appearance: alert, cooperative and no distress Extremities: Homans sign is negative, no sign of DVT, no edema, redness or tenderness in the calves or thighs and no ulcers, gangrene or trophic changes  Disposition: Home  with follow up in 2 weeks  Follow-up Information    Follow up with OLIN,Naven Giambalvo D in 2 weeks.   Contact information:   [facility] [address] [place] [address] [phone]          Discharge Orders    Future Appointments: Provider: Department: Dept Phone: Center:   02/02/2012 11:30 AM Mauri Brooklyn [facility] [phone] None   02/02/2012 12:00 PM Lennis Buzzy Han, MD [facility] [phone] None     Future Orders Please Complete By Expires   Diet - low sodium heart healthy      Call MD / Call 911      Comments:   If you experience chest pain or shortness of breath, CALL 911 and be transported to the hospital emergency room.  If you develope a fever above 101 F, pus (white drainage) or increased drainage or redness at the wound, or calf pain, call your surgeon's office.   Discharge instructions      Comments:   Maintain surgical dressing for 8 days, then replace with gauze and tape. Keep the  area dry and clean until follow up. Follow up in 2 weeks at [facility]. Call with any questions or concerns.     Constipation Prevention      Comments:   Drink plenty of fluids.  Prune juice may be helpful.  You may use a stool softener, such as Colace (over the counter) 100 mg twice a day.  Use MiraLax (over the counter) for constipation as needed.   Increase activity slowly as tolerated      Weight Bearing as taught in Physical Therapy      Comments:   Use a  walker or crutches as instructed.   Driving restrictions      Comments:   No driving for 4 weeks   Change dressing      Comments:   Maintain surgical dressing for 8 days, then replace with 4x4 guaze and tape. Keep the area dry and clean.   TED hose      Comments:   Use stockings (TED hose) for 2 weeks on both leg(s).  You may remove them at night for sleeping.      Discharge Medication List as of 12/28/2011 11:44 AM    START taking these medications   Details  docusate sodium 100 MG CAPS Take 100 mg by mouth 2 (two) times daily., Starting 12/28/2011, Until Sun 01/07/12, No Print    ferrous sulfate 325 (65 FE) MG tablet Take 1 tablet (325 mg total) by mouth 3 (three) times daily after meals., Starting 12/28/2011, Until Fri 12/27/12, No Print    HYDROcodone-acetaminophen (NORCO) 7.5-325 MG per tablet Take 1-2 tablets by mouth every 4 (four) hours as needed for pain., Starting 12/28/2011, Until Sun 01/07/12, Print    methocarbamol (ROBAXIN) 500 MG tablet Take 1 tablet (500 mg total) by mouth every 6 (six) hours as needed (muscle spasms)., Starting 12/28/2011, Until Sun 01/07/12, No Print    polyethylene glycol (MIRALAX / GLYCOLAX) packet Take 17 g by mouth 2 (two) times daily., Starting 12/28/2011, Until Sun 12/31/11, No Print    rivaroxaban (XARELTO) 10 MG TABS tablet Take 1 tablet (10 mg total) by mouth daily with breakfast., Starting 12/28/2011, Until Discontinued, Print      CONTINUE these medications which have NOT CHANGED   Details  ALPRAZolam (XANAX) 0.25 MG tablet Take 0.25 mg by mouth every 8 (eight) hours as needed., Starting 04/05/2011, Until Discontinued, Historical Med    amLODipine-benazepril (LOTREL) 5-20 MG per capsule Take 1 capsule by mouth daily., Until Discontinued, Historical Med    Calcium Carbonate-Vitamin D (CALCIUM 600 + D PO) Take 1 tablet by mouth 2 (two) times daily., Until Discontinued, Historical Med    Cinnamon 500 MG capsule Take 500 mg by mouth 2 (two) times  daily., Until Discontinued, Historical Med    MULTIPLE VITAMIN PO Take 1 tablet by mouth daily., Until Discontinued, Historical Med    Multiple Vitamins-Minerals (ICAPS) CAPS Take 2 capsules by mouth 2 (two) times daily., Until Discontinued, Historical Med    OMEGA 3 1200 MG CAPS Take 1 capsule by mouth 3 (three) times daily., Until Discontinued, Historical Med      STOP taking these medications     HYDROcodone-acetaminophen (VICODIN) 5-500 MG per tablet Comments:  Reason for Stopping:          Signed: Anastasio Auerbach. Mars Scheaffer   PAC  12/28/2011, 12:43 PM

## 2011-12-28 NOTE — Progress Notes (Signed)
Discharge summary sent to payer through [facility]  

## 2012-01-09 ENCOUNTER — Encounter (HOSPITAL_COMMUNITY): Payer: Self-pay | Admitting: Orthopedic Surgery

## 2012-02-02 ENCOUNTER — Telehealth: Payer: Self-pay | Admitting: Oncology

## 2012-02-02 ENCOUNTER — Other Ambulatory Visit (HOSPITAL_BASED_OUTPATIENT_CLINIC_OR_DEPARTMENT_OTHER): Payer: Medicare Other | Admitting: Lab

## 2012-02-02 ENCOUNTER — Encounter: Payer: Self-pay | Admitting: Oncology

## 2012-02-02 ENCOUNTER — Ambulatory Visit (HOSPITAL_BASED_OUTPATIENT_CLINIC_OR_DEPARTMENT_OTHER): Payer: Medicare Other | Admitting: Oncology

## 2012-02-02 VITALS — BP 132/86 | HR 77 | Temp 96.4°F | Ht 60.0 in | Wt 149.7 lb

## 2012-02-02 DIAGNOSIS — C9110 Chronic lymphocytic leukemia of B-cell type not having achieved remission: Secondary | ICD-10-CM

## 2012-02-02 DIAGNOSIS — Z853 Personal history of malignant neoplasm of breast: Secondary | ICD-10-CM

## 2012-02-02 LAB — CBC WITH DIFFERENTIAL/PLATELET
BASO%: 0.1 % (ref 0.0–2.0)
MCHC: 31.9 g/dL (ref 31.5–36.0)
MONO#: 0.6 10*3/uL (ref 0.1–0.9)
RBC: 4.55 10*6/uL (ref 3.70–5.45)
RDW: 15.6 % — ABNORMAL HIGH (ref 11.2–14.5)
WBC: 17 10*3/uL — ABNORMAL HIGH (ref 3.9–10.3)
lymph#: 14.1 10*3/uL — ABNORMAL HIGH (ref 0.9–3.3)
nRBC: 0 % (ref 0–0)

## 2012-02-02 LAB — COMPREHENSIVE METABOLIC PANEL
ALT: 10 U/L (ref 0–35)
AST: 18 U/L (ref 0–37)
Albumin: 4 g/dL (ref 3.5–5.2)
Calcium: 9.6 mg/dL (ref 8.4–10.5)
Chloride: 106 mEq/L (ref 96–112)
Potassium: 4.1 mEq/L (ref 3.5–5.3)

## 2012-02-02 NOTE — Telephone Encounter (Signed)
appts made and printed for pt aom °

## 2012-02-02 NOTE — Patient Instructions (Signed)
Benadryl 12.5 - 25 mg every 6 hrs as needed for rash today, + hydrocortisone cream

## 2012-02-02 NOTE — Progress Notes (Signed)
OFFICE PROGRESS NOTE Date of Visit: 02-02-2012 Physicians: K.Clelia Croft, M.Olin INTERVAL HISTORY:  Patient is seen, alone for visit, in continuing attention to her CLL, also history of stage I left breast cancer in 2004. She has now had right total hip replacement by Dr.Olin 12-26-2011, surgery uncomplicated and patient already much better than prior to surgery.  CLL was diagnosed in 2004 and has not required intervention other than two short courses of prednisone in Feb 2013 which did improve thrombocytopenia somewhat. Abdominal US in Oct 2012 had normal splenic volume.   Oncologic history is also significant for an early stage left breast cancer in 2004, treated with lumpectomy with sentinel node evaluation, local RT and 5 years of Femara thru 12-2007. She had bilateral mammograms at [facility] 11-13-2011, with heterogeneously dense breast tissue and some increase in benign appearing calcifications bilaterally, but with no mammographic findings of concern.  Patient has finished PT but is doing her exercises regularly at home. She carries a cane but does not need it now. She has had no fever or symptoms of infection, no bleeding or unusual bruising. Remainder of 10 point Review of Systems negative/ unchanged.  Patient will see Dr.Shaw with labs this summer. Objective:  Vital signs in last 24 hours:  BP 132/86  Pulse 77  Temp(Src) 96.4 F (35.8 C) (Oral)  Ht 5' (1.524 m)  Wt 149 lb 11.2 oz (67.903 kg)  BMI 29.24 kg/m2 Weight is down 4 lbs. Much more easily ambulatory today than prior to surgery. HEENT:mucous membranes moist, pharynx normal without lesions PERRL.  LymphaticsCervical, supraclavicular, and axillary nodes normal. Resp: clear to auscultation bilaterally and normal percussion bilaterally Cardio: regular rate and rhythm GI: soft, non-tender; bowel sounds normal; no masses,  no organomegaly. No inguinal adenopathy Extremities: without edema, cords, tenderness. Surgical incision right anterior  hip closed and not remarkable, some minimal swelling locally without erythema or tenderness. Neuro:nonfocal Left breast with well-healed lumpectomy scar, no dominant masses or other findings of concern, left axilla not remarkable. Right breast and right axilla likewise not remarkable.  Lab Results:   Basename 02/02/12 1122  WBC 17.0*  HGB 13.2  HCT 41.3  PLT 121*  ANC 2.3 14% lymphs with some variant lymphs and smudge cells present, as she has had previously.  BMET  Basename 02/02/12 1122  NA 143  K 4.1  CL 106  CO2 28  GLUCOSE 79  BUN 16  CREATININE 0.61  CALCIUM 9.6  Remainder of full CMET WNL  Studies/Results:  No results found.  Medications: I have reviewed the patient's current medications.  Assessment/Plan: 1.CLL: counts the best today that she has had in over a year.Will continue observation with next visit ~ Oct or sooner if needed. She needs flu shot in fall. 2.history of left breast cancer without known active disease, up to date on mammograms 3.recent right hip replacement Patient is in agreement with plan.  Reece Packer, MD   02/02/2012, 8:47 PM

## 2012-07-02 ENCOUNTER — Encounter: Payer: Self-pay | Admitting: Oncology

## 2012-07-02 ENCOUNTER — Ambulatory Visit (HOSPITAL_BASED_OUTPATIENT_CLINIC_OR_DEPARTMENT_OTHER): Payer: Medicare Other

## 2012-07-02 ENCOUNTER — Ambulatory Visit (HOSPITAL_BASED_OUTPATIENT_CLINIC_OR_DEPARTMENT_OTHER): Payer: Medicare Other | Admitting: Oncology

## 2012-07-02 ENCOUNTER — Telehealth: Payer: Self-pay | Admitting: Oncology

## 2012-07-02 ENCOUNTER — Other Ambulatory Visit (HOSPITAL_BASED_OUTPATIENT_CLINIC_OR_DEPARTMENT_OTHER): Payer: Medicare Other

## 2012-07-02 VITALS — BP 151/90 | HR 74 | Temp 97.5°F | Resp 18 | Ht 60.0 in | Wt 153.0 lb

## 2012-07-02 DIAGNOSIS — N390 Urinary tract infection, site not specified: Secondary | ICD-10-CM

## 2012-07-02 DIAGNOSIS — C9110 Chronic lymphocytic leukemia of B-cell type not having achieved remission: Secondary | ICD-10-CM

## 2012-07-02 DIAGNOSIS — Z853 Personal history of malignant neoplasm of breast: Secondary | ICD-10-CM

## 2012-07-02 LAB — CBC WITH DIFFERENTIAL/PLATELET
Basophils Absolute: 0 10*3/uL (ref 0.0–0.1)
EOS%: 0.4 % (ref 0.0–7.0)
HGB: 14.9 g/dL (ref 11.6–15.9)
LYMPH%: 82.9 % — ABNORMAL HIGH (ref 14.0–49.7)
MCH: 29.9 pg (ref 25.1–34.0)
MCV: 90.8 fL (ref 79.5–101.0)
MONO%: 3.2 % (ref 0.0–14.0)
Platelets: 106 10*3/uL — ABNORMAL LOW (ref 145–400)
RBC: 4.98 10*6/uL (ref 3.70–5.45)
RDW: 15.4 % — ABNORMAL HIGH (ref 11.2–14.5)

## 2012-07-02 LAB — URINALYSIS, MICROSCOPIC - CHCC
Blood: NEGATIVE
Glucose: NEGATIVE g/dL
Leukocyte Esterase: NEGATIVE
Nitrite: NEGATIVE
Protein: NEGATIVE mg/dL

## 2012-07-02 NOTE — Progress Notes (Signed)
OFFICE PROGRESS NOTE   07/02/2012   Physicians: K.Clelia Croft, M.Olin  INTERVAL HISTORY:  Patient is seen, alone for visit, in scheduled follow up of her CLL, also history of early stage breast cancer.  CLL was diagnosed in 2004 and has not required intervention until now; the slightly progressive thrombocytopenia has not been symptomatic. Abdominal US in Oct 2012 had normal splenic volume. She had two short courses of prednisone at just 20 mg daily x 3 every other week in early 2013, tolerated without difficulty and some increase in platelets noted. PMH is also significant for an early stage left breast cancer in 2004, treated with lumpectomy with sentinel node evaluation, local RT and 5 years of Femara thru 12-2007. She had bilateral mammograms at [facility] 11-13-2011, with heterogeneously dense breast tissue and some increase in benign appearing calcifications bilaterally, with no mammographic findings of concern.  She had right total hip replacement by Dr Charlann Boxer in April 2013.  Patient has had UTI  x3 since April, including last week. Symptoms have been dysuria followed by chill sensation, without fever. Symptoms have resolved each time with 3 days of cipro. I am not sure that she has had urine cultures sent, and she has not had follow up evaluation since most recent antibiotics, tho she is not experiencing symptoms today. She has had flu vaccine, has had no other symptoms of infection. She is doing well with knee, continues exercises and is to see Dr Charlann Boxer again in 1 year. She is not aware of change in adenopathy. She has no new or different pain, no change on breast self exam, good appetite, energy at baseline. Remainder of 10 point Review of Systems negative.  She is to see PCP Dr Cam Hai in Jan.  Objective:  Vital signs in last 24 hours:  BP 151/90  Pulse 74  Temp 97.5 F (36.4 C) (Oral)  Resp 18  Ht 5' (1.524 m)  Wt 153 lb (69.4 kg)  BMI 29.88 kg/m2 Weight is stable. Easily ambulatory,  appears comfortable.   HEENT:PERRLA, sclera clear, anicteric and oropharynx clear, no lesions Neck supple.  LymphaticsCervical, supraclavicular, and axillary nodes normal. No inguinal adenopathy Resp: clear to auscultation bilaterally and normal percussion bilaterally Cardio: regular rate and rhythm GI: soft, non-tender; bowel sounds normal; no masses,  no organomegaly Extremities: extremities normal, atraumatic, no cyanosis or edema Neuro:no sensory deficits noted Breast: left lumpectomy scar unchanged, no dominant mass or other findings of concern and axilla benign. Right without dominant mass or skin/nipple findings, axilla likewise unremarkable. Skin without rash, ecchymoses, petechiae Lab Results:  Results for orders placed in visit on 07/02/12  CBC WITH DIFFERENTIAL      Component Value Range   WBC 19.0 (*) 3.9 - 10.3 10e3/uL   NEUT# 2.5  1.5 - 6.5 10e3/uL   HGB 14.9  11.6 - 15.9 g/dL   HCT 16.1  09.6 - 04.5 %   Platelets 106 (*) 145 - 400 10e3/uL   MCV 90.8  79.5 - 101.0 fL   MCH 29.9  25.1 - 34.0 pg   MCHC 32.9  31.5 - 36.0 g/dL   RBC 4.09  8.11 - 9.14 10e6/uL   RDW 15.4 (*) 11.2 - 14.5 %   lymph# 15.7 (*) 0.9 - 3.3 10e3/uL   MONO# 0.6  0.1 - 0.9 10e3/uL   Eosinophils Absolute 0.1  0.0 - 0.5 10e3/uL   Basophils Absolute 0.0  0.0 - 0.1 10e3/uL   NEUT% 13.3 (*) 38.4 - 76.8 %  LYMPH% 82.9 (*) 14.0 - 49.7 %   MONO% 3.2  0.0 - 14.0 %   EOS% 0.4  0.0 - 7.0 %   BASO% 0.2  0.0 - 2.0 %  TECHNOLOGIST REVIEW      Component Value Range   Technologist Review Variant lymphs present      UA, C&S repeated today to be sure infection cleared.  Studies/Results:  No results found.  Medications: I have reviewed the patient's current medications. Note has had flu shot.  CBC is generally stable, with platelets >100k, ANC above neutropenic, and not symptomatic.   Assessment/Plan: 1. CLL: continue observation. I will see her at least in 6 months, or sooner if needed. 2.history of  early stage breast cancer, not known recurrent. Mammograms at [facility] in Feb. 3.post knee replacement 4.recent UTIs: follow up urine pending. Recommended regular cranberry juice, as this may actually benefit.   Reece Packer, MD   07/02/2012, 4:23 PM

## 2012-07-02 NOTE — Telephone Encounter (Signed)
appts made and printed for  Pt aom °

## 2012-07-02 NOTE — Patient Instructions (Signed)
We will recheck urine today

## 2012-07-03 ENCOUNTER — Other Ambulatory Visit: Payer: Medicare Other | Admitting: Lab

## 2012-07-03 ENCOUNTER — Ambulatory Visit: Payer: Medicare Other | Admitting: Oncology

## 2012-07-04 ENCOUNTER — Telehealth: Payer: Self-pay

## 2012-07-04 NOTE — Telephone Encounter (Signed)
Told Ms. Blakenship that the urine culture showed no infection.

## 2012-12-04 ENCOUNTER — Encounter: Payer: Self-pay | Admitting: Oncology

## 2013-01-01 ENCOUNTER — Ambulatory Visit (HOSPITAL_BASED_OUTPATIENT_CLINIC_OR_DEPARTMENT_OTHER): Payer: Medicare Other | Admitting: Oncology

## 2013-01-01 ENCOUNTER — Telehealth: Payer: Self-pay | Admitting: Oncology

## 2013-01-01 ENCOUNTER — Encounter: Payer: Self-pay | Admitting: Oncology

## 2013-01-01 ENCOUNTER — Other Ambulatory Visit (HOSPITAL_BASED_OUTPATIENT_CLINIC_OR_DEPARTMENT_OTHER): Payer: Medicare Other | Admitting: Lab

## 2013-01-01 VITALS — BP 142/82 | HR 75 | Temp 97.6°F | Resp 20 | Ht 60.0 in | Wt 159.8 lb

## 2013-01-01 DIAGNOSIS — Z853 Personal history of malignant neoplasm of breast: Secondary | ICD-10-CM

## 2013-01-01 DIAGNOSIS — C9110 Chronic lymphocytic leukemia of B-cell type not having achieved remission: Secondary | ICD-10-CM

## 2013-01-01 LAB — COMPREHENSIVE METABOLIC PANEL (CC13)
ALT: 13 U/L (ref 0–55)
Albumin: 3.4 g/dL — ABNORMAL LOW (ref 3.5–5.0)
CO2: 25 mEq/L (ref 22–29)
Glucose: 99 mg/dl (ref 70–99)
Potassium: 3.9 mEq/L (ref 3.5–5.1)
Sodium: 142 mEq/L (ref 136–145)
Total Bilirubin: 0.61 mg/dL (ref 0.20–1.20)
Total Protein: 7 g/dL (ref 6.4–8.3)

## 2013-01-01 LAB — LACTATE DEHYDROGENASE (CC13): LDH: 178 U/L (ref 125–245)

## 2013-01-01 LAB — CBC WITH DIFFERENTIAL/PLATELET
Basophils Absolute: 0 10*3/uL (ref 0.0–0.1)
EOS%: 0.5 % (ref 0.0–7.0)
Eosinophils Absolute: 0.1 10*3/uL (ref 0.0–0.5)
HCT: 45.5 % (ref 34.8–46.6)
HGB: 15 g/dL (ref 11.6–15.9)
LYMPH%: 85.5 % — ABNORMAL HIGH (ref 14.0–49.7)
MCH: 30 pg (ref 25.1–34.0)
MCV: 90.7 fL (ref 79.5–101.0)
MONO%: 2.4 % (ref 0.0–14.0)
NEUT#: 2.4 10*3/uL (ref 1.5–6.5)
NEUT%: 11.4 % — ABNORMAL LOW (ref 38.4–76.8)
Platelets: 105 10*3/uL — ABNORMAL LOW (ref 145–400)
RDW: 14.4 % (ref 11.2–14.5)

## 2013-01-01 LAB — MORPHOLOGY: PLT EST: DECREASED

## 2013-01-01 MED ORDER — HYDROCODONE-ACETAMINOPHEN 5-325 MG PO TABS: 1.0000 | ORAL_TABLET | Freq: Four times a day (QID) | ORAL | Status: DC | PRN

## 2013-01-01 NOTE — Patient Instructions (Signed)
Call if bleeding, bruising, recurrent infections or other concerns prior to next scheduled appointment.

## 2013-01-01 NOTE — Telephone Encounter (Signed)
gv and printed appt schedule and avs for pt.... °

## 2013-01-01 NOTE — Progress Notes (Signed)
OFFICE PROGRESS NOTE   01/01/2013   Physicians: K.Clelia Croft, M.Olin  INTERVAL HISTORY:   Patient is seen, alone for visit, in scheduled follow up of her CLL and early stage breast cancer. She is on observation for both problems now. She tells me that she has done well since visit 6 months ago, with exception of low back pain for past 4-5 days, this occurring after she sat at a difficult angle and improving with heating pad and with small amount of hydrocodone. She has had no recent infections, no bleeding and no noted changes in breasts.  Oncologic History CLL was diagnosed in 2004 and has not required intervention until now; the slightly progressive thrombocytopenia has not been symptomatic. Abdominal US in Oct 2012 had normal splenic volume. She had two short courses of prednisone at just 20 mg daily x 3 every other week in early 2013, tolerated without difficulty and some increase in platelets noted.  PMH is also significant for an early stage left breast cancer in 2004, treated with lumpectomy with sentinel node evaluation, local RT and 5 years of Femara thru 12-2007. She had bilateral 3D mammograms at [facility] 12-04-12, still heterogeneously dense breast tissue but without mamographic findings of concern.  She has regular physical exam with Dr Clelia Croft, then labs also at that office at 6 months. She is to see Dr Charlann Boxer next week, in follow up of hip surgery April 2013.  Appetite is good. Has not been as physically active during winter. No other different pain. No respiratory or GI symptoms. No noted change in adenopathy. Remainder of 10 point Review of Systems negative.  Objective:  Vital signs in last 24 hours:  BP 142/82  Pulse 75  Temp(Src) 97.6 F (36.4 C) (Oral)  Resp 20  Ht 5' (1.524 m)  Wt 159 lb 12.8 oz (72.485 kg)  BMI 31.21 kg/m2  Alert, gets up from chair slowly but ambulatory without assistance. NAD. Respirations not labored RA  HEENT:PERRLA, sclera clear, anicteric and oropharynx  clear, no lesions Lymphatics: no discreet cervical or supraclavicular adenopathy, no axillary adenopathy Resp: clear to auscultation bilaterally and normal percussion bilaterally Cardio: regular rate and rhythm Back with some kyphosis GI: soft, non-tender; bowel sounds normal; no masses,  no organomegaly Cannot appreciate enlarged spleen Extremities: extremities normal, atraumatic, no cyanosis or edema Neuro: nonfocal Breast: left with well healed lumpectomy scar, otherwise no dominant mass and no skin or nipple findings bilaterally. Axillae benign, no swelling UE. Skin without rash, petechiae or ecchymosis, including low back.  Lab Results:  Results for orders placed in visit on 01/01/13  CBC WITH DIFFERENTIAL      Result Value Range   WBC 20.7 (*) 3.9 - 10.3 10e3/uL   NEUT# 2.4  1.5 - 6.5 10e3/uL   HGB 15.0  11.6 - 15.9 g/dL   HCT 16.1  09.6 - 04.5 %   Platelets 105 (*) 145 - 400 10e3/uL   MCV 90.7  79.5 - 101.0 fL   MCH 30.0  25.1 - 34.0 pg   MCHC 33.0  31.5 - 36.0 g/dL   RBC 4.09  8.11 - 9.14 10e6/uL   RDW 14.4  11.2 - 14.5 %   lymph# 17.7 (*) 0.9 - 3.3 10e3/uL   MONO# 0.5  0.1 - 0.9 10e3/uL   Eosinophils Absolute 0.1  0.0 - 0.5 10e3/uL   Basophils Absolute 0.0  0.0 - 0.1 10e3/uL   NEUT% 11.4 (*) 38.4 - 76.8 %   LYMPH% 85.5 (*) 14.0 - 49.7 %  MONO% 2.4  0.0 - 14.0 %   EOS% 0.5  0.0 - 7.0 %   BASO% 0.2  0.0 - 2.0 %   CBC is stable from 6 mo ago. CMET available after visit normal with exception of albumin 3.4  Studies/Results:  Mammograms [facility] 12-04-12 scanned into this EMR  Medications: I have reviewed the patient's current medications. As she tries to avoid NSAID and ASA due to mild thrombocytopenia, I have given her additional hydrocodone APAP 5/325 #20 for the back discomfort; Dr Charlann Boxer or Dr Clelia Croft can continue this medication if appropriate.  Assessment/Plan: 1.CLL: Will continue observation with next visit ~ Oct or sooner if needed. She expects to have labs by Dr  Kirtland Bouchard.Clelia Croft ~ June. She needs flu shot in fall.  2.history of left breast cancer without known active disease, up to date on mammograms  3. right hip replacement April 2013 4.low back discomfort this week, seems to have been activity related and to be improving. She will let Dr Charlann Boxer know how this is at scheduled visit next week. Prn hydrocodone and heat for now.  Patient is in agreement with plan above and knows that she or other doctors can call at any time prior to next scheduled visit if needed.    Abigail Moreno P, MD   01/01/2013, 11:47 AM

## 2013-01-03 LAB — IMMUNOFIXATION ELECTROPHORESIS
IgM, Serum: 12 mg/dL — ABNORMAL LOW (ref 52–322)
Total Protein, Serum Electrophoresis: 6.8 g/dL (ref 6.0–8.3)

## 2013-02-12 ENCOUNTER — Encounter: Payer: Self-pay | Admitting: Oncology

## 2013-02-12 NOTE — Progress Notes (Signed)
Patient had called and left message to call her back. I did and had to leave a message for call back.

## 2013-02-18 ENCOUNTER — Encounter: Payer: Self-pay | Admitting: Oncology

## 2013-02-18 NOTE — Progress Notes (Signed)
Patient had left a message and I called her back. She received the denial for lab 01/01/13. I advised would check to make sure the balance of 2.72 is correct for her portion. She said ok to leave on vmail if I call her back and she is not there.

## 2013-02-19 ENCOUNTER — Encounter: Payer: Self-pay | Admitting: Oncology

## 2013-02-19 NOTE — Progress Notes (Signed)
I called the patient back to advise reply from insurance.  called out to [facility] and spoke with Desma Paganini (REF# [id number]) and she said that on the claim they did pay on these 2 CPTs [phone] & [phone])and the patient has a co-insurance due of 2.72 but that they did not pay on 2 other CPT's that were on the same claim ([id number] and [id number]) as she stated it was due to "Provider may not bill for these charges" due to bundling issues.  I asked if she was able to fax me a copy of their EOB and she said that since the payment was made 01-30-13 they have to wait 30 days for one to generate in order to fax it out.  The rep stated that if the patient has questions concerning the co-insurance amount or the EOB to call them at the customer service number on the back of her member ID card

## 2013-04-23 ENCOUNTER — Other Ambulatory Visit: Payer: Self-pay

## 2013-06-18 ENCOUNTER — Telehealth: Payer: Self-pay | Admitting: Oncology

## 2013-06-18 NOTE — Telephone Encounter (Signed)
Per Vanessa Barbara former LL pt requesting to rtn to LL. Moved 10/15 appt from CP1 to LL. S/w pt re new time for lb/LL 10/15 @ 12:30pm.

## 2013-07-02 ENCOUNTER — Telehealth: Payer: Self-pay | Admitting: Oncology

## 2013-07-02 ENCOUNTER — Ambulatory Visit: Payer: Medicare Other

## 2013-07-02 ENCOUNTER — Ambulatory Visit (HOSPITAL_BASED_OUTPATIENT_CLINIC_OR_DEPARTMENT_OTHER): Payer: Medicare Other | Admitting: Oncology

## 2013-07-02 ENCOUNTER — Other Ambulatory Visit: Payer: Medicare Other | Admitting: Lab

## 2013-07-02 ENCOUNTER — Other Ambulatory Visit (HOSPITAL_BASED_OUTPATIENT_CLINIC_OR_DEPARTMENT_OTHER): Payer: Medicare Other | Admitting: Lab

## 2013-07-02 ENCOUNTER — Encounter: Payer: Self-pay | Admitting: Oncology

## 2013-07-02 VITALS — BP 150/88 | HR 79 | Temp 97.5°F | Resp 18 | Ht 60.0 in | Wt 157.9 lb

## 2013-07-02 DIAGNOSIS — J309 Allergic rhinitis, unspecified: Secondary | ICD-10-CM

## 2013-07-02 DIAGNOSIS — Z1231 Encounter for screening mammogram for malignant neoplasm of breast: Secondary | ICD-10-CM

## 2013-07-02 DIAGNOSIS — Z853 Personal history of malignant neoplasm of breast: Secondary | ICD-10-CM

## 2013-07-02 DIAGNOSIS — C9110 Chronic lymphocytic leukemia of B-cell type not having achieved remission: Secondary | ICD-10-CM

## 2013-07-02 LAB — CBC WITH DIFFERENTIAL/PLATELET
Basophils Absolute: 0.1 10*3/uL (ref 0.0–0.1)
Eosinophils Absolute: 0 10*3/uL (ref 0.0–0.5)
HCT: 46.2 % (ref 34.8–46.6)
HGB: 15.2 g/dL (ref 11.6–15.9)
MCH: 29.9 pg (ref 25.1–34.0)
MCV: 90.8 fL (ref 79.5–101.0)
NEUT#: 3 10*3/uL (ref 1.5–6.5)
NEUT%: 12.3 % — ABNORMAL LOW (ref 38.4–76.8)
RDW: 14.4 % (ref 11.2–14.5)
lymph#: 21.1 10*3/uL — ABNORMAL HIGH (ref 0.9–3.3)

## 2013-07-02 LAB — COMPREHENSIVE METABOLIC PANEL (CC13)
ALT: 14 U/L (ref 0–55)
AST: 24 U/L (ref 5–34)
Albumin: 3.8 g/dL (ref 3.5–5.0)
Alkaline Phosphatase: 83 U/L (ref 40–150)
Calcium: 9.8 mg/dL (ref 8.4–10.4)
Chloride: 106 mEq/L (ref 98–109)
Potassium: 4.1 mEq/L (ref 3.5–5.1)
Sodium: 141 mEq/L (ref 136–145)
Total Protein: 7.4 g/dL (ref 6.4–8.3)

## 2013-07-02 LAB — MORPHOLOGY

## 2013-07-02 NOTE — Patient Instructions (Signed)
Try Claritin (generic lortadine is FINE)  10 mg:   1/2 to 1 tablet daily for sinus drainage

## 2013-07-02 NOTE — Progress Notes (Signed)
OFFICE PROGRESS NOTE   07/02/2013   Physicians:K.Clelia Croft, M.Olin   INTERVAL HISTORY:  Patient is seen, alone for visit, in scheduled 6 month follow up of CLL, also remote history of early stage breast cancer. She is on observation for both of these diagnoses. She has felt well other than orthopedic problems since she was here last, with no infectious illness, no bleeding, energy at baseline/ fairly good, no uncomfortable adenopathy. She has had flu vaccine. She had bilateral mammograms at [facility] 12-04-2012 with no mammographic findings of concern.   ONCOLOGIC HISTORY CLL was diagnosed in 2004 and has not required intervention until now; the slightly progressive thrombocytopenia has not been symptomatic. Abdominal US in Oct 2012 had normal splenic volume. She had two short courses of prednisone at just 20 mg daily x 3 every other week in early 2013, tolerated without difficulty and some increase in platelets noted.  PMH is also significant for an early stage left breast cancer in 2004, treated with lumpectomy with sentinel node evaluation, local RT and 5 years of Femara thru 12-2007. She had bilateral 3D mammograms at [facility] 12-04-12, still heterogeneously dense breast tissue but without mamographic findings of concern.   Review of systems as above, also: Bothersome post nasal drainage as she has had for years, consistent with allergic rhinitis. No pain. No lower respiratory symptoms. No noted changes on breast self exam. No LE swelling.  Remainder of 10 point Review of Systems negative.  Objective:  Vital signs in last 24 hours:  BP 150/88  Pulse 79  Temp(Src) 97.5 F (36.4 C) (Oral)  Resp 18  Ht 5' (1.524 m)  Wt 157 lb 14.4 oz (71.623 kg)  BMI 30.84 kg/m2  SpO2 96%  Alert, oriented and appropriate. Ambulatory without assistance difficulty.  Alopecia  HEENT:PERRL, sclerae not icteric. Oral mucosa moist without lesions, posterior pharynx clear.  Neck supple. No JVD.  Lymphatics:no  cervical,suraclavicular, axillary or inguinal adenopathy Resp: clear to auscultation bilaterally and normal percussion bilaterally Cardio: regular rate and rhythm. No gallop. GI: soft, nontender, not distended, no mass or organomegaly appreciable. Normally active bowel sounds.  Musculoskeletal/ Extremities: without pitting edema, cords, tenderness. Back nontender Neuro:  nonfocal Skin without rash, ecchymosis, petechiae Breasts:left lumpectomy scar not remarkable, otherwise bilaterally without dominant mass, skin or nipple findings. Axillae benign.   Lab Results:  Results for orders placed in visit on 07/02/13  CBC WITH DIFFERENTIAL      Result Value Range   WBC 24.7 (*) 3.9 - 10.3 10e3/uL   NEUT# 3.0  1.5 - 6.5 10e3/uL   HGB 15.2  11.6 - 15.9 g/dL   HCT 47.8  29.5 - 62.1 %   Platelets 107 (*) 145 - 400 10e3/uL   MCV 90.8  79.5 - 101.0 fL   MCH 29.9  25.1 - 34.0 pg   MCHC 32.9  31.5 - 36.0 g/dL   RBC 3.08  6.57 - 8.46 10e6/uL   RDW 14.4  11.2 - 14.5 %   lymph# 21.1 (*) 0.9 - 3.3 10e3/uL   MONO# 0.5  0.1 - 0.9 10e3/uL   Eosinophils Absolute 0.0  0.0 - 0.5 10e3/uL   Basophils Absolute 0.1  0.0 - 0.1 10e3/uL   NEUT% 12.3 (*) 38.4 - 76.8 %   LYMPH% 85.5 (*) 14.0 - 49.7 %   MONO% 1.8  0.0 - 14.0 %   EOS% 0.2  0.0 - 7.0 %   BASO% 0.2  0.0 - 2.0 %     Studies/Results:  Mammograms  [facility] 3D bilateral 11-13-2012  Medications: I have reviewed the patient's current medications. She will try OTC Claritin/ lortadine 1/2 - one 10 mg tablet daily. She has had flu vaccine  Assessment/Plan: 1.CLL: counts remain stable, no complications, continuing observation. I will see her in 6 months or sooner if needed 2.history of early stage left breast cancer 2004, no known active disease. Next mammograms [facility] 10-2013, 3D appropriate due to dense breast tissue 3.right total hip replacement 12-2011, which has been very helpful. 4.allergic rhinitis: year round, not really improved with netti pot.  Recommend trial on lortadine. 5.flu vaccine done  LIVESAY,LENNIS P, MD   07/02/2013, 1:18 PM

## 2013-07-24 ENCOUNTER — Other Ambulatory Visit: Payer: Self-pay

## 2013-09-22 ENCOUNTER — Telehealth: Payer: Self-pay | Admitting: *Deleted

## 2013-09-22 NOTE — Telephone Encounter (Signed)
sw pt informed her that her labs has been moved to 01/01/14. gv appt for 01/01/14 w/ labs@ 1pm and ov@ 1:30pm..the patient is aware...td

## 2013-12-28 ENCOUNTER — Other Ambulatory Visit: Payer: Self-pay | Admitting: Oncology

## 2013-12-31 ENCOUNTER — Other Ambulatory Visit: Payer: Medicare Other

## 2014-01-01 ENCOUNTER — Other Ambulatory Visit (HOSPITAL_BASED_OUTPATIENT_CLINIC_OR_DEPARTMENT_OTHER): Payer: Commercial Managed Care - HMO

## 2014-01-01 ENCOUNTER — Ambulatory Visit (HOSPITAL_BASED_OUTPATIENT_CLINIC_OR_DEPARTMENT_OTHER): Payer: Commercial Managed Care - HMO | Admitting: Oncology

## 2014-01-01 ENCOUNTER — Telehealth: Payer: Self-pay | Admitting: Oncology

## 2014-01-01 ENCOUNTER — Encounter: Payer: Self-pay | Admitting: Oncology

## 2014-01-01 VITALS — BP 162/92 | HR 78 | Temp 97.5°F | Resp 18 | Ht 60.0 in | Wt 159.1 lb

## 2014-01-01 DIAGNOSIS — C9110 Chronic lymphocytic leukemia of B-cell type not having achieved remission: Secondary | ICD-10-CM

## 2014-01-01 DIAGNOSIS — Z853 Personal history of malignant neoplasm of breast: Secondary | ICD-10-CM

## 2014-01-01 LAB — CBC WITH DIFFERENTIAL/PLATELET
BASO%: 0.2 % (ref 0.0–2.0)
Basophils Absolute: 0 10*3/uL (ref 0.0–0.1)
EOS%: 0.3 % (ref 0.0–7.0)
Eosinophils Absolute: 0.1 10*3/uL (ref 0.0–0.5)
HCT: 45.5 % (ref 34.8–46.6)
HGB: 15 g/dL (ref 11.6–15.9)
LYMPH#: 19 10*3/uL — AB (ref 0.9–3.3)
LYMPH%: 82.5 % — ABNORMAL HIGH (ref 14.0–49.7)
MCH: 30.4 pg (ref 25.1–34.0)
MCHC: 33 g/dL (ref 31.5–36.0)
MCV: 92.3 fL (ref 79.5–101.0)
MONO#: 0.7 10*3/uL (ref 0.1–0.9)
MONO%: 2.9 % (ref 0.0–14.0)
NEUT#: 3.3 10*3/uL (ref 1.5–6.5)
NEUT%: 14.1 % — ABNORMAL LOW (ref 38.4–76.8)
Platelets: 113 10*3/uL — ABNORMAL LOW (ref 145–400)
RBC: 4.93 10*6/uL (ref 3.70–5.45)
RDW: 13.9 % (ref 11.2–14.5)
WBC: 23 10*3/uL — AB (ref 3.9–10.3)

## 2014-01-01 LAB — COMPREHENSIVE METABOLIC PANEL (CC13)
ALBUMIN: 4 g/dL (ref 3.5–5.0)
ALT: 14 U/L (ref 0–55)
ANION GAP: 10 meq/L (ref 3–11)
AST: 22 U/L (ref 5–34)
Alkaline Phosphatase: 90 U/L (ref 40–150)
BILIRUBIN TOTAL: 0.59 mg/dL (ref 0.20–1.20)
BUN: 17.4 mg/dL (ref 7.0–26.0)
CALCIUM: 9.9 mg/dL (ref 8.4–10.4)
CHLORIDE: 107 meq/L (ref 98–109)
CO2: 27 mEq/L (ref 22–29)
Creatinine: 0.8 mg/dL (ref 0.6–1.1)
GLUCOSE: 94 mg/dL (ref 70–140)
POTASSIUM: 4.2 meq/L (ref 3.5–5.1)
SODIUM: 144 meq/L (ref 136–145)
TOTAL PROTEIN: 7.3 g/dL (ref 6.4–8.3)

## 2014-01-01 LAB — LACTATE DEHYDROGENASE (CC13): LDH: 170 U/L (ref 125–245)

## 2014-01-01 NOTE — Progress Notes (Signed)
OFFICE PROGRESS NOTE   01/01/2014   Physicians:K.Brigitte Pulse, M.Olin, Sanders (ophth)   INTERVAL HISTORY:  Patient is seen, alone for visit, in scheduled 6 month follow up of CLL which is on observation, also remote history of early stage left breast cancer.  She is not aware of any problems related to the CLL. She and husband both had respiratory congestion with cough several weeks ago which resolved without intervention. She has intermittent hip and low back pain, last steroid injection by Dr Alvan Dame a year ago and she may want to repeat that. She has had no fever or sweats, no bleeding, no adenopathy concerns and energy and appetite are at baseline.   ONCOLOGIC HISTORY CLL was diagnosed in 2004 and has not required intervention until now; the slightly progressive thrombocytopenia has not been symptomatic. Abdominal US in Oct 2012 had normal splenic volume. She had two short courses of prednisone at just 20 mg daily x 3 every other week in early 2013, tolerated without difficulty and some increase in platelets noted.  PMH is also significant for an early stage left breast cancer in 2004, treated with lumpectomy with sentinel node evaluation, local RT and 5 years of Femara thru 12-2007. She had bilateral 3D mammograms at [facility] 11-17-13, still heterogeneously dense breast tissue but without mamographic findings of concern.  Review of systems as above, also: Decreased vision from macular degeneration in right eye. Having injections in left eye every 8-9 weeks. No new or different pain. Remainder of 10 point Review of Systems negative.  Objective:  Vital signs in last 24 hours:  BP 162/92  Pulse 78  Temp(Src) 97.5 F (36.4 C) (Oral)  Resp 18  Ht 5' (1.524 m)  Wt 159 lb 1.6 oz (72.167 kg)  BMI 31.07 kg/m2 Weight is up 1 lb. Alert, oriented and appropriate. Ambulatory without assistance HEENT:PERRL, sclerae not icteric. Oral mucosa moist without lesions, posterior pharynx clear.  Neck supple. No  JVD.  Lymphatics: lower cervical and supraclavicular fullness not changed. No axillary or inguinal adenopathy Resp: clear to auscultation bilaterally and normal percussion bilaterally Cardio: regular rate and rhythm. No gallop. GI: soft, nontender, not distended, no mass or organomegaly. Normally active bowel sounds.  Musculoskeletal/ Extremities: without pitting edema, cords, tenderness Neuro: no peripheral neuropathy. Otherwise nonfocal Skin without rash, ecchymosis, petechiae Breasts: without dominant mass, skin or nipple findings. Well healed lumpectomy scar left inferolateral. Axillae benign  Lab Results:  Results for orders placed in visit on 01/01/14  CBC WITH DIFFERENTIAL      Result Value Ref Range   WBC 23.0 (*) 3.9 - 10.3 10e3/uL   NEUT# 3.3  1.5 - 6.5 10e3/uL   HGB 15.0  11.6 - 15.9 g/dL   HCT 45.5  34.8 - 46.6 %   Platelets 113 (*) 145 - 400 10e3/uL   MCV 92.3  79.5 - 101.0 fL   MCH 30.4  25.1 - 34.0 pg   MCHC 33.0  31.5 - 36.0 g/dL   RBC 4.93  3.70 - 5.45 10e6/uL   RDW 13.9  11.2 - 14.5 %   lymph# 19.0 (*) 0.9 - 3.3 10e3/uL   MONO# 0.7  0.1 - 0.9 10e3/uL   Eosinophils Absolute 0.1  0.0 - 0.5 10e3/uL   Basophils Absolute 0.0  0.0 - 0.1 10e3/uL   NEUT% 14.1 (*) 38.4 - 76.8 %   LYMPH% 82.5 (*) 14.0 - 49.7 %   MONO% 2.9  0.0 - 14.0 %   EOS% 0.3  0.0 - 7.0 %  BASO% 0.2  0.0 - 2.0 %  COMPREHENSIVE METABOLIC PANEL (PY09)      Result Value Ref Range   Sodium 144  136 - 145 mEq/L   Potassium 4.2  3.5 - 5.1 mEq/L   Chloride 107  98 - 109 mEq/L   CO2 27  22 - 29 mEq/L   Glucose 94  70 - 140 mg/dl   BUN 17.4  7.0 - 26.0 mg/dL   Creatinine 0.8  0.6 - 1.1 mg/dL   Total Bilirubin 0.59  0.20 - 1.20 mg/dL   Alkaline Phosphatase 90  40 - 150 U/L   AST 22  5 - 34 U/L   ALT 14  0 - 55 U/L   Total Protein 7.3  6.4 - 8.3 g/dL   Albumin 4.0  3.5 - 5.0 g/dL   Calcium 9.9  8.4 - 10.4 mg/dL   Anion Gap 10  3 - 11 mEq/L  LACTATE DEHYDROGENASE (CC13)      Result Value Ref Range    LDH 170  125 - 245 U/L     Studies/Results:  No results found.  Medications: I have reviewed the patient's current medications.  DISCUSSION: counts and exam stable, will continue to follow. As her next appointment with Dr Brigitte Pulse is in Jan 2016, I will see her back in ~ 6 mo with labs here.  Assessment/Plan:  1.CLL: counts remain stable, no complications, continuing observation. I will see her in 6 months or sooner if needed  2.history of early stage left breast cancer 2004, no known active disease. Next mammograms [facility] 2016, 3D appropriate due to dense breast tissue  3.right total hip replacement 12-2011, which has been very helpful.  4.allergic rhinitis       Gordy Levan, MD   01/01/2014, 3:33 PM

## 2014-04-29 ENCOUNTER — Other Ambulatory Visit: Payer: Self-pay

## 2014-04-29 ENCOUNTER — Telehealth: Payer: Self-pay | Admitting: Oncology

## 2014-04-29 DIAGNOSIS — C9110 Chronic lymphocytic leukemia of B-cell type not having achieved remission: Secondary | ICD-10-CM

## 2014-04-29 NOTE — Telephone Encounter (Signed)
, °

## 2014-06-17 ENCOUNTER — Other Ambulatory Visit: Payer: Self-pay | Admitting: Oncology

## 2014-07-05 ENCOUNTER — Other Ambulatory Visit: Payer: Self-pay | Admitting: Oncology

## 2014-07-06 ENCOUNTER — Other Ambulatory Visit (HOSPITAL_BASED_OUTPATIENT_CLINIC_OR_DEPARTMENT_OTHER): Payer: Commercial Managed Care - HMO

## 2014-07-06 ENCOUNTER — Ambulatory Visit (HOSPITAL_BASED_OUTPATIENT_CLINIC_OR_DEPARTMENT_OTHER): Payer: Commercial Managed Care - HMO | Admitting: Oncology

## 2014-07-06 ENCOUNTER — Telehealth: Payer: Self-pay | Admitting: Oncology

## 2014-07-06 ENCOUNTER — Encounter: Payer: Self-pay | Admitting: Oncology

## 2014-07-06 VITALS — BP 149/77 | HR 68 | Temp 97.6°F | Resp 20 | Ht 60.0 in | Wt 156.0 lb

## 2014-07-06 DIAGNOSIS — C50912 Malignant neoplasm of unspecified site of left female breast: Secondary | ICD-10-CM

## 2014-07-06 DIAGNOSIS — C9110 Chronic lymphocytic leukemia of B-cell type not having achieved remission: Secondary | ICD-10-CM

## 2014-07-06 DIAGNOSIS — Z853 Personal history of malignant neoplasm of breast: Secondary | ICD-10-CM

## 2014-07-06 LAB — COMPREHENSIVE METABOLIC PANEL (CC13)
ALBUMIN: 3.8 g/dL (ref 3.5–5.0)
ALT: 19 U/L (ref 0–55)
AST: 21 U/L (ref 5–34)
Alkaline Phosphatase: 77 U/L (ref 40–150)
Anion Gap: 11 mEq/L (ref 3–11)
BUN: 14.1 mg/dL (ref 7.0–26.0)
CO2: 28 mEq/L (ref 22–29)
Calcium: 10.2 mg/dL (ref 8.4–10.4)
Chloride: 105 mEq/L (ref 98–109)
Creatinine: 0.8 mg/dL (ref 0.6–1.1)
GLUCOSE: 88 mg/dL (ref 70–140)
POTASSIUM: 3.9 meq/L (ref 3.5–5.1)
SODIUM: 144 meq/L (ref 136–145)
TOTAL PROTEIN: 7 g/dL (ref 6.4–8.3)
Total Bilirubin: 0.72 mg/dL (ref 0.20–1.20)

## 2014-07-06 LAB — CBC WITH DIFFERENTIAL/PLATELET
BASO%: 0.2 % (ref 0.0–2.0)
BASOS ABS: 0.1 10*3/uL (ref 0.0–0.1)
EOS%: 0.4 % (ref 0.0–7.0)
Eosinophils Absolute: 0.1 10*3/uL (ref 0.0–0.5)
HCT: 47.4 % — ABNORMAL HIGH (ref 34.8–46.6)
HEMOGLOBIN: 15.1 g/dL (ref 11.6–15.9)
LYMPH%: 85.4 % — ABNORMAL HIGH (ref 14.0–49.7)
MCH: 29.4 pg (ref 25.1–34.0)
MCHC: 31.9 g/dL (ref 31.5–36.0)
MCV: 92.1 fL (ref 79.5–101.0)
MONO#: 0.6 10*3/uL (ref 0.1–0.9)
MONO%: 2.7 % (ref 0.0–14.0)
NEUT#: 2.6 10*3/uL (ref 1.5–6.5)
NEUT%: 11.3 % — ABNORMAL LOW (ref 38.4–76.8)
Platelets: 119 10*3/uL — ABNORMAL LOW (ref 145–400)
RBC: 5.15 10*6/uL (ref 3.70–5.45)
RDW: 14.6 % — AB (ref 11.2–14.5)
WBC: 22.7 10*3/uL — ABNORMAL HIGH (ref 3.9–10.3)
lymph#: 19.4 10*3/uL — ABNORMAL HIGH (ref 0.9–3.3)

## 2014-07-06 LAB — TECHNOLOGIST REVIEW

## 2014-07-06 LAB — LACTATE DEHYDROGENASE (CC13): LDH: 169 U/L (ref 125–245)

## 2014-07-06 NOTE — Telephone Encounter (Signed)
Gave AVS & appt d/t for April 2016. Pt states she has already sch Mammo.

## 2014-07-06 NOTE — Progress Notes (Signed)
OFFICE PROGRESS NOTE   07/06/2014   Physicians:K.Brigitte Pulse, M.Olin, Sanders (ophth), J.Hayes   INTERVAL HISTORY:  Patient is seen, alone for visit, in continuing attention to CLL for which she has been on observation since diagnosis in 2004, recently with mild thrombocytopenia. She also has history of early stage left breast cancer 2004.  Patient has generally been well since she was here last 6 months ago, without infectious illness (did have diarrhea and vomiting x <24 hrs seemed food related), bleeding or unusual bruising, uncomfortable adenopathy or decrease in energy.  She was contacted by [facility] to schedule 10 year colonoscopy, tho she is 78 and had no findings of concern on last colonoscopy. I have given her hemoccult cards to do within week prior to next visit to PCP and to be returned to PCP office then, but doubt she needs routine colonoscopy as long as these are ok.  Patient's son, in 71s, was recently found to have aortic aneurysm "at the valve", treated surgically and recovering well. Another son had died abruptly in his 35s, now thought may have been similar. Her son's first cousin has also had this problem.  She does not have PAC.  She is given flu vaccine today.   ONCOLOGIC HISTORY CLL was diagnosed in 2004 and has not required intervention until now; the slightly progressive thrombocytopenia has not been symptomatic. Abdominal US in Oct 2012 had normal splenic volume. She had two short courses of prednisone at just 20 mg daily x 3 every other week in early 2013, tolerated without difficulty and some increase in platelets noted.  PMH is also significant for an early stage left breast cancer in 2004, treated with lumpectomy with sentinel node evaluation, local RT and 5 years of Femara thru 12-2007. She had bilateral 3D mammograms at [facility] 11-17-13, still heterogeneously dense breast tissue but without mamographic findings of concern.  Review of systems as above, also: No fever or  symptoms of infection. No SOB, appetite good, no changes in bowels, no abdominal or pelvic discomfort, no noted changes in breasts, no LE swelling. Remainder of 10 point Review of Systems negative.  Objective:  Vital signs in last 24 hours:  BP 149/77  Pulse 68  Temp(Src) 97.6 F (36.4 C) (Oral)  Resp 20  Ht 5' (1.524 m)  Wt 156 lb (70.761 kg)  BMI 30.47 kg/m2 weight is down 3 lbs.  Alert, oriented and appropriate. Ambulatory without assistance    HEENT:PERRL, sclerae not icteric. Oral mucosa moist without lesions, posterior pharynx clear.  Neck supple. No JVD.  Lymphatics:lower cervical and supraclavicular mild adenopathy unchanged, no axillary or inguinal adenopathy Resp: clear to auscultation bilaterally and normal percussion bilaterally Cardio: regular rate and rhythm. No gallop. GI: abdomen obese, soft, nontender, not distended, no appreciable mass or organomegaly. Normally active bowel sounds. Surgical incision not remarkable. Musculoskeletal/ Extremities: without pitting edema, cords, tenderness Neuro: nonfocal PSYCH appropriate mood and affect Skin without rash, ecchymosis, petechiae Breasts: left lumpectomy not remarkable, otherwise bilaterally without dominant mass, skin or nipple findings. Axillae benign.   Lab Results:  Results for orders placed in visit on 07/06/14  TECHNOLOGIST REVIEW      Result Value Ref Range   Technologist Review Variant lymphs present, smudge cells    CBC WITH DIFFERENTIAL      Result Value Ref Range   WBC 22.7 (*) 3.9 - 10.3 10e3/uL   NEUT# 2.6  1.5 - 6.5 10e3/uL   HGB 15.1  11.6 - 15.9 g/dL   HCT  47.4 (*) 34.8 - 46.6 %   Platelets 119 (*) 145 - 400 10e3/uL   MCV 92.1  79.5 - 101.0 fL   MCH 29.4  25.1 - 34.0 pg   MCHC 31.9  31.5 - 36.0 g/dL   RBC 5.15  3.70 - 5.45 10e6/uL   RDW 14.6 (*) 11.2 - 14.5 %   lymph# 19.4 (*) 0.9 - 3.3 10e3/uL   MONO# 0.6  0.1 - 0.9 10e3/uL   Eosinophils Absolute 0.1  0.0 - 0.5 10e3/uL   Basophils  Absolute 0.1  0.0 - 0.1 10e3/uL   NEUT% 11.3 (*) 38.4 - 76.8 %   LYMPH% 85.4 (*) 14.0 - 49.7 %   MONO% 2.7  0.0 - 14.0 %   EOS% 0.4  0.0 - 7.0 %   BASO% 0.2  0.0 - 2.0 %  COMPREHENSIVE METABOLIC PANEL (JF35)      Result Value Ref Range   Sodium 144  136 - 145 mEq/L   Potassium 3.9  3.5 - 5.1 mEq/L   Chloride 105  98 - 109 mEq/L   CO2 28  22 - 29 mEq/L   Glucose 88  70 - 140 mg/dl   BUN 14.1  7.0 - 26.0 mg/dL   Creatinine 0.8  0.6 - 1.1 mg/dL   Total Bilirubin 0.72  0.20 - 1.20 mg/dL   Alkaline Phosphatase 77  40 - 150 U/L   AST 21  5 - 34 U/L   ALT 19  0 - 55 U/L   Total Protein 7.0  6.4 - 8.3 g/dL   Albumin 3.8  3.5 - 5.0 g/dL   Calcium 10.2  8.4 - 10.4 mg/dL   Anion Gap 11  3 - 11 mEq/L  LACTATE DEHYDROGENASE (CC13)      Result Value Ref Range   LDH 169  125 - 245 U/L    Hemoccult cards x3 given with instructions, patient to take these to Dr Brigitte Pulse at scheduled apt in 09-2014  Studies/Results:  No results found. Mammograms 11-17-13 [facility], one year follow up recommended  Medications: I have reviewed the patient's current medications. Flu vaccine given today.  DISCUSSION: CBC as above reviewed.  CLL clinically stable, continue to follow. I would need to see her sooner than 6 months if significant infections, bleeding, significant increase in adenopathy or other B symptoms.  Assessment/Plan: 1.CLL: counts remain stable, no complications, continuing observation. I will see her in 6 months or sooner if needed  2.history of early stage left breast cancer 2004, no known active disease. Next mammograms [facility] 2016, 3D appropriate due to dense breast tissue  3.right total hip replacement 12-2011, which has been very helpful.  4.allergic rhinitis stable 5.flu vaccine done 6.may not need screening colonoscopy unless PCP has other information of which I am not aware. Hemoccult cards as above. Patient will not set up colonoscopy at least until she talks with PCP 7.family members with aortic  aneurysms   Time spent 25 min including >50% counseling and coordination of care. Cc this note to Dr Raliegh Ip.Corene Cornea, MD   07/06/2014, 10:32 AM

## 2014-08-24 ENCOUNTER — Telehealth: Payer: Self-pay | Admitting: Oncology

## 2014-08-24 NOTE — Telephone Encounter (Signed)
lvm for pt regarding to April 2016 appt d.t change.....mailed pt appt sched and letter

## 2014-11-24 ENCOUNTER — Telehealth: Payer: Self-pay | Admitting: *Deleted

## 2014-11-24 NOTE — Telephone Encounter (Signed)
Received fax of mammogram performed 11/23/14 results from [facility]. Copy given to Dr. Marko Plume for review and original sent to HIM to be scanned into patient's chart.

## 2014-11-30 ENCOUNTER — Encounter: Payer: Self-pay | Admitting: Oncology

## 2014-11-30 NOTE — Progress Notes (Signed)
Medical Oncology  Report of bilateral screening 3D mammograms received from [facility] dated 11-23-14: breast tissue heterogeneously dense, benign calcifications on left, no mammographic evidence of malignancy.  Report to be scanned into this EMR.  Godfrey Pick, MD

## 2015-01-03 ENCOUNTER — Other Ambulatory Visit: Payer: Self-pay | Admitting: Oncology

## 2015-01-03 DIAGNOSIS — C9110 Chronic lymphocytic leukemia of B-cell type not having achieved remission: Secondary | ICD-10-CM

## 2015-01-06 ENCOUNTER — Other Ambulatory Visit: Payer: Commercial Managed Care - HMO

## 2015-01-06 ENCOUNTER — Ambulatory Visit: Payer: Commercial Managed Care - HMO | Admitting: Oncology

## 2015-01-07 ENCOUNTER — Ambulatory Visit (HOSPITAL_BASED_OUTPATIENT_CLINIC_OR_DEPARTMENT_OTHER): Payer: Commercial Managed Care - HMO | Admitting: Oncology

## 2015-01-07 ENCOUNTER — Telehealth: Payer: Self-pay | Admitting: Oncology

## 2015-01-07 ENCOUNTER — Telehealth: Payer: Self-pay

## 2015-01-07 ENCOUNTER — Encounter: Payer: Self-pay | Admitting: Oncology

## 2015-01-07 ENCOUNTER — Other Ambulatory Visit (HOSPITAL_BASED_OUTPATIENT_CLINIC_OR_DEPARTMENT_OTHER): Payer: Commercial Managed Care - HMO

## 2015-01-07 VITALS — BP 167/88 | HR 76 | Temp 97.6°F | Resp 18 | Ht 60.0 in | Wt 151.9 lb

## 2015-01-07 DIAGNOSIS — R310 Gross hematuria: Secondary | ICD-10-CM

## 2015-01-07 DIAGNOSIS — C9110 Chronic lymphocytic leukemia of B-cell type not having achieved remission: Secondary | ICD-10-CM

## 2015-01-07 DIAGNOSIS — H3530 Unspecified macular degeneration: Secondary | ICD-10-CM | POA: Insufficient documentation

## 2015-01-07 DIAGNOSIS — Z803 Family history of malignant neoplasm of breast: Secondary | ICD-10-CM

## 2015-01-07 DIAGNOSIS — C50912 Malignant neoplasm of unspecified site of left female breast: Secondary | ICD-10-CM

## 2015-01-07 LAB — CBC WITH DIFFERENTIAL/PLATELET
BASO%: 0.2 % (ref 0.0–2.0)
Basophils Absolute: 0.1 10*3/uL (ref 0.0–0.1)
EOS ABS: 0.1 10*3/uL (ref 0.0–0.5)
EOS%: 0.3 % (ref 0.0–7.0)
HCT: 46.3 % (ref 34.8–46.6)
HGB: 14.8 g/dL (ref 11.6–15.9)
LYMPH#: 19.1 10*3/uL — AB (ref 0.9–3.3)
LYMPH%: 87.2 % — ABNORMAL HIGH (ref 14.0–49.7)
MCH: 29.3 pg (ref 25.1–34.0)
MCHC: 31.9 g/dL (ref 31.5–36.0)
MCV: 91.9 fL (ref 79.5–101.0)
MONO#: 0.5 10*3/uL (ref 0.1–0.9)
MONO%: 2.1 % (ref 0.0–14.0)
NEUT#: 2.2 10*3/uL (ref 1.5–6.5)
NEUT%: 10.2 % — ABNORMAL LOW (ref 38.4–76.8)
Platelets: 101 10*3/uL — ABNORMAL LOW (ref 145–400)
RBC: 5.04 10*6/uL (ref 3.70–5.45)
RDW: 14.6 % — AB (ref 11.2–14.5)
WBC: 21.9 10*3/uL — ABNORMAL HIGH (ref 3.9–10.3)

## 2015-01-07 LAB — COMPREHENSIVE METABOLIC PANEL (CC13)
ALK PHOS: 84 U/L (ref 40–150)
ALT: 16 U/L (ref 0–55)
AST: 21 U/L (ref 5–34)
Albumin: 3.7 g/dL (ref 3.5–5.0)
Anion Gap: 14 mEq/L — ABNORMAL HIGH (ref 3–11)
BUN: 14.9 mg/dL (ref 7.0–26.0)
CO2: 22 mEq/L (ref 22–29)
Calcium: 9.1 mg/dL (ref 8.4–10.4)
Chloride: 106 mEq/L (ref 98–109)
Creatinine: 0.7 mg/dL (ref 0.6–1.1)
EGFR: 80 mL/min/{1.73_m2} — ABNORMAL LOW (ref >90)
Glucose: 84 mg/dl (ref 70–140)
POTASSIUM: 4.2 meq/L (ref 3.5–5.1)
Sodium: 142 mEq/L (ref 136–145)
TOTAL PROTEIN: 6.8 g/dL (ref 6.4–8.3)
Total Bilirubin: 0.61 mg/dL (ref 0.20–1.20)

## 2015-01-07 LAB — URINALYSIS, MICROSCOPIC - CHCC
BLOOD: NEGATIVE
Bilirubin (Urine): NEGATIVE
Glucose: NEGATIVE mg/dL
Ketones: NEGATIVE mg/dL
Leukocyte Esterase: NEGATIVE
Nitrite: NEGATIVE
PROTEIN: NEGATIVE mg/dL
Specific Gravity, Urine: 1.01 (ref 1.003–1.035)
UROBILINOGEN UR: 0.2 mg/dL (ref 0.2–1)
pH: 5 (ref 4.6–8.0)

## 2015-01-07 LAB — TECHNOLOGIST REVIEW

## 2015-01-07 NOTE — Telephone Encounter (Signed)
Gave pt message ua results

## 2015-01-07 NOTE — Progress Notes (Signed)
OFFICE PROGRESS NOTE   January 07, 2015   Physicians:K.Brigitte Pulse, M.Olin, Sanders (ophth), J.Hayes  INTERVAL HISTORY:   Patient is seen, alone for visit, in scheduled follow up of CLL which has not yet required treatment, also history of early stage left breast cancer 2004. Most recent mammograms were 3D screening at [facility] 11-23-14, with heterogeneously dense breast tissue but no other mammographic findings of concern. Report to be scanned into this EMR.  Patient has not been ill since she was here last in October, with energy at baseline, no uncomfortable adenopathy, no bleeding or unusual bruising. She is not aware of any changes in breasts. She has chronic urinary incontinence and had small amount of dark blood on panty liner last week, only time she has seen that. She denies dysuria, does not believe this was vaginal, has not had gyn exam in some time. Per notes below, she was not on Tamoxifen for the breast cancer. She has been under more stress at home as husband has worsening memory problems, had MI in Jan, and difficulty ambulating.    ONCOLOGIC HISTORY CLL was diagnosed in 2004 and has not required intervention until now; the slightly progressive thrombocytopenia has not been symptomatic. Abdominal US in Oct 2012 had normal splenic volume. She had two short courses of prednisone at just 20 mg daily x 3 every other week in early 2013, tolerated without difficulty and some increase in platelets noted.   PMH is also significant for an early stage left breast cancer in 2004, treated with lumpectomy with sentinel node evaluation, local RT and 5 years of Femara thru 12-2007. She had bilateral 3D mammograms at [facility] 11-23-14, still heterogeneously dense breast tissue but without mamographic findings of concern.    Review of systems as above, also: No fevers or night sweats. No increased SOB, no abdominal or pelvic pain, no LE swelling. Remainder of 10 point Review of Systems  negative.  Objective:  Vital signs in last 24 hours:  BP 167/88 mmHg  Pulse 76  Temp(Src) 97.6 F (36.4 C) (Oral)  Resp 18  Ht 5' (1.524 m)  Wt 151 lb 14.4 oz (68.901 kg)  BMI 29.67 kg/m2  SpO2 97% Weight down 4 lbs, which she feels is related to situation at home. Alert, oriented and appropriate. Ambulatory without assistance.  No alopecia  HEENT:PERRL, sclerae not icteric. Oral mucosa moist without lesions, posterior pharynx clear.  Neck supple. No JVD.  Lymphatics: soft lower cervical nodes R>L up to ~ 1.5 cm, no suraclavicular, axillary or inguinal adenopathy Resp: clear to auscultation bilaterally and normal percussion bilaterally Cardio: regular rate and rhythm. No gallop. GI: abdomen obese, soft, nontender, not distended, no appreciable mass or organomegaly. Normally active bowel sounds.  Musculoskeletal/ Extremities: without pitting edema, cords, tenderness Neuro: speech fluent and appropriate, no focal deficits. Psych appropriate mood and affect Skin without rash, ecchymosis, petechiae Breasts: Well healed lumpectomy scar on left otherwise without dominant mass, skin or nipple findings. Axillae benign.   Lab Results:  Results for orders placed or performed in visit on 01/07/15  TECHNOLOGIST REVIEW  Result Value Ref Range   Technologist Review Variant lymphs present. few smudge cells   CBC with Differential  Result Value Ref Range   WBC 21.9 (H) 3.9 - 10.3 10e3/uL   NEUT# 2.2 1.5 - 6.5 10e3/uL   HGB 14.8 11.6 - 15.9 g/dL   HCT 46.3 34.8 - 46.6 %   Platelets 101 (L) 145 - 400 10e3/uL   MCV 91.9 79.5 -  101.0 fL   MCH 29.3 25.1 - 34.0 pg   MCHC 31.9 31.5 - 36.0 g/dL   RBC 5.04 3.70 - 5.45 10e6/uL   RDW 14.6 (H) 11.2 - 14.5 %   lymph# 19.1 (H) 0.9 - 3.3 10e3/uL   MONO# 0.5 0.1 - 0.9 10e3/uL   Eosinophils Absolute 0.1 0.0 - 0.5 10e3/uL   Basophils Absolute 0.1 0.0 - 0.1 10e3/uL   NEUT% 10.2 (L) 38.4 - 76.8 %   LYMPH% 87.2 (H) 14.0 - 49.7 %   MONO% 2.1 0.0 -  14.0 %   EOS% 0.3 0.0 - 7.0 %   BASO% 0.2 0.0 - 2.0 %  Comprehensive metabolic panel (Cmet) - [facility]  Result Value Ref Range   Sodium 142 136 - 145 mEq/L   Potassium 4.2 3.5 - 5.1 mEq/L   Chloride 106 98 - 109 mEq/L   CO2 22 22 - 29 mEq/L   Glucose 84 70 - 140 mg/dl   BUN 14.9 7.0 - 26.0 mg/dL   Creatinine 0.7 0.6 - 1.1 mg/dL   Total Bilirubin 0.61 0.20 - 1.20 mg/dL   Alkaline Phosphatase 84 40 - 150 U/L   AST 21 5 - 34 U/L   ALT 16 0 - 55 U/L   Total Protein 6.8 6.4 - 8.3 g/dL   Albumin 3.7 3.5 - 5.0 g/dL   Calcium 9.1 8.4 - 10.4 mg/dL   Anion Gap 14 (H) 3 - 11 mEq/L   EGFR 80 (L) >90 ml/min/1.73 m2     Studies/Results: [facility] mammogram report from 11-23-14 reviewed  Medications: I have reviewed the patient's current medications.  DISCUSSION: counts and exam stable from standpoint of CLL, and nothing of concern from standpoint of breast cancer history. She knows to call if significant bleeding or unusual bruising. I will see her with labs in Sept, or sooner if needed.  Assessment/Plan:  1.CLL: counts remain essentially stable, no complications, continuing observation. Platelet count responded well to short courses of steroids several years ago. I will see her in Sept or sooner if needed. She knows that she needs flu vaccine yearly 2.history of early stage left breast cancer 2004, no known active disease. Mammograms [facility] up to date, 3D appropriate due to dense breast tissue 3.small dark blood x 1 ? Urinary: UA C&S to be sent today and we will let patient know results.  4.right total hip replacement 12-2011, which has been very helpful.  5.flu vaccine done 6.Son(s) with aortic aneurysm   All questions answered. Patient is comfortable with discussion and plans, and will call if needed prior to next appointment in Sept.   Gordy Levan, MD   01/07/2015, 11:02 AM

## 2015-01-07 NOTE — Telephone Encounter (Signed)
per pof to sch pt appt-gave pt copy of sch °

## 2015-01-07 NOTE — Telephone Encounter (Signed)
error 

## 2015-01-07 NOTE — Telephone Encounter (Signed)
LM for Abigail Moreno a message to call back regarding the UA today. The urinalysis was negative for an infection and there was no blood.

## 2015-01-09 NOTE — Progress Notes (Signed)
Addendum  time spent 25 min including >50% counseling and coordination of care L.Lovie Chol, MD

## 2015-03-16 ENCOUNTER — Ambulatory Visit (INDEPENDENT_AMBULATORY_CARE_PROVIDER_SITE_OTHER): Payer: Commercial Managed Care - HMO | Admitting: Obstetrics & Gynecology

## 2015-03-16 ENCOUNTER — Encounter: Payer: Self-pay | Admitting: Obstetrics & Gynecology

## 2015-03-16 VITALS — BP 148/84 | HR 68 | Resp 16 | Ht 60.25 in | Wt 147.0 lb

## 2015-03-16 DIAGNOSIS — N952 Postmenopausal atrophic vaginitis: Secondary | ICD-10-CM | POA: Insufficient documentation

## 2015-03-16 DIAGNOSIS — N390 Urinary tract infection, site not specified: Secondary | ICD-10-CM | POA: Diagnosis not present

## 2015-03-16 DIAGNOSIS — E782 Mixed hyperlipidemia: Secondary | ICD-10-CM | POA: Insufficient documentation

## 2015-03-16 DIAGNOSIS — N8110 Cystocele, unspecified: Secondary | ICD-10-CM

## 2015-03-16 DIAGNOSIS — Z124 Encounter for screening for malignant neoplasm of cervix: Secondary | ICD-10-CM | POA: Diagnosis not present

## 2015-03-16 DIAGNOSIS — M8580 Other specified disorders of bone density and structure, unspecified site: Secondary | ICD-10-CM | POA: Insufficient documentation

## 2015-03-16 DIAGNOSIS — R82998 Other abnormal findings in urine: Secondary | ICD-10-CM

## 2015-03-16 DIAGNOSIS — M1990 Unspecified osteoarthritis, unspecified site: Secondary | ICD-10-CM | POA: Insufficient documentation

## 2015-03-16 DIAGNOSIS — N950 Postmenopausal bleeding: Secondary | ICD-10-CM | POA: Diagnosis not present

## 2015-03-16 DIAGNOSIS — IMO0002 Reserved for concepts with insufficient information to code with codable children: Secondary | ICD-10-CM

## 2015-03-16 NOTE — Progress Notes (Signed)
81 y.o. [id number] MarriedCaucasianF here for new pt exam due to PMP bleeding.  Pt reports three Sundays in a row she has seen some bright red spotting after going to the bathroom.  It is just with wiping or a small amount on her pad that she wears due to urinary leakage.  H/O TAH due to "large tumor" in uterus.  Pt thinks it was a fibroid.  Also had heavy bleeding and prolapse as well.     H/O bladder surgery as well.  Pt has had this done twice.  Last was around 2007.  Pt does have incontinence and wears a pad.  Reports no changes with bladder or bowel function.  H/O breast cancer 2004.  ER/PR +.  Had lumpectomy.  Did have radiation but no chemo.  Was on HRT patch at the time of diagnosis.  None since.  PCP:  Dr. Mayra Neer.  Last appt was in March.  Blood work was all normal.    Oncologist:  Dr. Marko Plume.  Diagnosed in 2004 with CLL.  Has never had treatment.  No LMP recorded. Patient has had a hysterectomy.          Sexually active: No.  The current method of family planning is status post hysterectomy.    Exercising: No.  not regularly Smoker:  Former smoker-years ago  Health Maintenance: Pap:  Several years ago History of abnormal Pap:  no MMG:  11/23/14 3D-normal Colonoscopy:  +10 years ago-Dr Amedeo Plenty BMD:   4/16-borderline osteoporosis TDaP:  03/04/09 Screening Labs: done with Dr. Brigitte Pulse   reports that she quit smoking about 56 years ago. She has never used smokeless tobacco. She reports that she does not drink alcohol or use illicit drugs.  Past Medical History  Diagnosis Date  . Breast cancer FEBRUARY 2004    T1, NO, ER/PR POSITIVE LEFT BREAST  . CLL (chronic lymphocytic leukemia) 2004  . Hypertension   . Hyperlipemia   . Blood transfusion     hx of (with first and ? second delivery)  . Arthritis of knee  FALL 2012    RIGHT KNEE, hips, and fingers  . Anxiety   . Macular degeneration     Past Surgical History  Procedure Laterality Date  . Mastectomy partial /  lumpectomy w/ axillary lymphadenectomy  FEBRUARY 2004    LEFT BREAST  . Abdominal hysterectomy  1972    UNILATERAL OOPHORECTOMY( LEFT)  . Appendectomy    . Incontinence surgery    . Eye surgery      left eye surgery   . Other surgical history      cysts removed from breasts   . Total hip arthroplasty  12/26/2011    Procedure: TOTAL HIP ARTHROPLASTY ANTERIOR APPROACH;  Surgeon: Mauri Pole, MD;  Location: [facility];  Service: Orthopedics;  Laterality: Right;    Current Outpatient Prescriptions  Medication Sig Dispense Refill  . amLODipine-benazepril (LOTREL) 5-20 MG per capsule Take 1 capsule by mouth daily. Takes BP prior to using medication per PCP    . amoxicillin (AMOXIL) 500 MG capsule TK 4 CS PO 1 HOUR PRIOR TO DENTAL APPOINTMENT  0  . Calcium Carbonate-Vitamin D (CALCIUM 600 + D PO) Take 1 tablet by mouth daily.     . Cinnamon 500 MG capsule Take 500 mg by mouth 2 (two) times daily.    Marland Kitchen CRANBERRY CONCENTRATE PO Take 1 tablet by mouth daily.    Marland Kitchen loratadine (CLARITIN) 10 MG tablet Take 5 mg by mouth  daily.    . MULTIPLE VITAMIN PO Take 1 tablet by mouth daily.    . Multiple Vitamins-Minerals (ICAPS) CAPS Take 2 capsules by mouth 2 (two) times daily.    . OMEGA 3 1200 MG CAPS Take 1 capsule by mouth 3 (three) times daily.     No current facility-administered medications for this visit.    Family History  Problem Relation Age of Onset  . Pancreatic cancer Maternal Grandmother   . Cancer Paternal Grandmother     ? type   . Cancer Other     breast-1st paternal cousin-no treatment, liver cancer-2nd maternal cousin  . Hypertension Mother   . Hypertension Sister     x2  . High Cholesterol Sister     x2  . Heart attack Father     ROS:  Pertinent items are noted in HPI.  Otherwise, a comprehensive ROS was negative.  Exam:   BP 148/84 mmHg  Pulse 68  Resp 16  Ht 5' 0.25" (1.53 m)  Wt 147 lb (66.679 kg)  BMI 28.48 kg/m2  LMP   Weight change: @WEIGHTCHANGE @ Height:    Height: 5' 0.25" (153 cm)  Ht Readings from Last 3 Encounters:  03/16/15 5' 0.25" (1.53 m)  01/07/15 5' (1.524 m)  07/06/14 5' (1.524 m)    General appearance: alert, cooperative and appears stated age Head: Normocephalic, without obvious abnormality, atraumatic Neck: no adenopathy, supple, symmetrical, trachea midline and thyroid normal to inspection and palpation Breasts: normal appearance, no masses or tenderness, s/p lumpectomy on left with well healed incision Abdomen: soft, non-tender; bowel sounds normal; no masses,  no organomegaly Extremities: extremities normal, atraumatic, no cyanosis or edema Skin: Skin color, texture, turgor normal. No rashes or lesions Lymph nodes: Cervical, supraclavicular, and axillary nodes normal. No abnormal inguinal nodes palpated Neurologic: Grossly normal  Pelvic: External genitalia:  no lesions              Urethra:  normal appearing urethra with no masses, tenderness or lesions              Bartholins and Skenes: normal                 Vagina: atrophic skin changes with vascular changes along suture lines from prior bladder repair.  Very short vaginal, only about 1 1/2 inches in length.  No masses or fulness.  3rd degree prolapse of bladder as well.   Cervix: absent              Pap taken: Yes.   Small amount of bleeding noted with Pap smear. Bimanual Exam:  Uterus:  uterus absent              Adnexa: no mass, fullness, tenderness               Rectovaginal: Confirms, small hemorrhoids noted.  Guaiac negative.               Anus:  normal sphincter tone, no lesions  Chaperone was present for exam.  A:  PMP bleeding in pt with atrophic skin changes, 3rd degree cystocele, two prior bladder surgeries and shorted vagina H/O breast cancer 2004, ER/PR +, s/p lumpectomy H/O CLL.  Followed conservatively by Dr Marko Plume Hypertension  P:   D/W pt findings on physical exam.  She has a very short vaginal and vascular changes along the suture lines of  prior surgery.  These all appear benign.  I did obtain a Pap smear but suspect this  will be normal.  D/W pt possible methods for improving atrophic changes.  Estrogens discussed but discouraged due to breast cancer hx.  I do not think she will be able to use Replens vaginal moisturizer due to shortened vagina.  Feel topical application of coconut oil or olive oil could be helpful.  Reassured pt that no abnormal findings noted other than thin tissue, short vagina, and vascular changes along suture lines most likely from her cystocele.  Follow up recommended if this doesn't help symptoms and/or they worsen. Pap pending. Urine micro pending.

## 2015-03-16 NOTE — Patient Instructions (Signed)
Coconut oil externally and vaginally 1-2 times weeks.  Just apply some to your fingers and into the vagina.

## 2015-03-17 LAB — URINALYSIS, MICROSCOPIC ONLY
Bacteria, UA: NONE SEEN
CRYSTALS: NONE SEEN
Casts: NONE SEEN
SQUAMOUS EPITHELIAL / LPF: NONE SEEN

## 2015-03-18 LAB — IPS PAP TEST WITH HPV

## 2015-03-23 ENCOUNTER — Telehealth: Payer: Self-pay

## 2015-03-23 NOTE — Telephone Encounter (Signed)
-----   Message from Megan Salon, MD sent at 03/23/2015  1:24 AM EDT ----- 02 recall.  Can you please call pt and let her know this was negative.  I'd like to see her for follow up in 2-3 months.  Urine micro was negative.  I think she was inform of this as well.  Please confirm.  Thanks.

## 2015-03-23 NOTE — Telephone Encounter (Signed)
Lmtcb//kn 

## 2015-04-27 NOTE — Telephone Encounter (Signed)
Patient notified of all results.//kn 

## 2015-06-04 ENCOUNTER — Ambulatory Visit: Payer: Commercial Managed Care - HMO | Admitting: Obstetrics & Gynecology

## 2015-06-06 ENCOUNTER — Other Ambulatory Visit: Payer: Self-pay | Admitting: Oncology

## 2015-06-06 DIAGNOSIS — C9110 Chronic lymphocytic leukemia of B-cell type not having achieved remission: Secondary | ICD-10-CM

## 2015-06-14 ENCOUNTER — Other Ambulatory Visit (HOSPITAL_BASED_OUTPATIENT_CLINIC_OR_DEPARTMENT_OTHER): Payer: Commercial Managed Care - HMO

## 2015-06-14 ENCOUNTER — Ambulatory Visit (HOSPITAL_BASED_OUTPATIENT_CLINIC_OR_DEPARTMENT_OTHER): Payer: Commercial Managed Care - HMO | Admitting: Oncology

## 2015-06-14 ENCOUNTER — Encounter: Payer: Self-pay | Admitting: Oncology

## 2015-06-14 ENCOUNTER — Telehealth: Payer: Self-pay | Admitting: Oncology

## 2015-06-14 VITALS — BP 143/86 | HR 77 | Temp 97.4°F | Resp 18 | Ht 60.25 in | Wt 148.0 lb

## 2015-06-14 DIAGNOSIS — Z23 Encounter for immunization: Secondary | ICD-10-CM

## 2015-06-14 DIAGNOSIS — C9110 Chronic lymphocytic leukemia of B-cell type not having achieved remission: Secondary | ICD-10-CM | POA: Diagnosis not present

## 2015-06-14 DIAGNOSIS — C50912 Malignant neoplasm of unspecified site of left female breast: Secondary | ICD-10-CM

## 2015-06-14 DIAGNOSIS — D696 Thrombocytopenia, unspecified: Secondary | ICD-10-CM

## 2015-06-14 DIAGNOSIS — Z90710 Acquired absence of both cervix and uterus: Secondary | ICD-10-CM

## 2015-06-14 DIAGNOSIS — F4321 Adjustment disorder with depressed mood: Secondary | ICD-10-CM

## 2015-06-14 LAB — COMPREHENSIVE METABOLIC PANEL (CC13)
ALT: 13 U/L (ref 0–55)
AST: 20 U/L (ref 5–34)
Albumin: 3.9 g/dL (ref 3.5–5.0)
Alkaline Phosphatase: 83 U/L (ref 40–150)
Anion Gap: 9 mEq/L (ref 3–11)
BUN: 13.5 mg/dL (ref 7.0–26.0)
CALCIUM: 9.4 mg/dL (ref 8.4–10.4)
CHLORIDE: 107 meq/L (ref 98–109)
CO2: 27 mEq/L (ref 22–29)
CREATININE: 0.7 mg/dL (ref 0.6–1.1)
EGFR: 77 mL/min/{1.73_m2} — ABNORMAL LOW (ref >90)
Glucose: 88 mg/dl (ref 70–140)
Potassium: 4 mEq/L (ref 3.5–5.1)
Sodium: 143 mEq/L (ref 136–145)
Total Bilirubin: 0.76 mg/dL (ref 0.20–1.20)
Total Protein: 6.9 g/dL (ref 6.4–8.3)

## 2015-06-14 LAB — CBC WITH DIFFERENTIAL/PLATELET
BASO%: 0.2 % (ref 0.0–2.0)
Basophils Absolute: 0 10*3/uL (ref 0.0–0.1)
EOS%: 0.2 % (ref 0.0–7.0)
Eosinophils Absolute: 0.1 10*3/uL (ref 0.0–0.5)
HEMATOCRIT: 46.9 % — AB (ref 34.8–46.6)
HEMOGLOBIN: 15.3 g/dL (ref 11.6–15.9)
LYMPH#: 23.2 10*3/uL — AB (ref 0.9–3.3)
LYMPH%: 88.5 % — ABNORMAL HIGH (ref 14.0–49.7)
MCH: 30.2 pg (ref 25.1–34.0)
MCHC: 32.6 g/dL (ref 31.5–36.0)
MCV: 92.6 fL (ref 79.5–101.0)
MONO#: 0.6 10*3/uL (ref 0.1–0.9)
MONO%: 2.1 % (ref 0.0–14.0)
NEUT%: 9 % — AB (ref 38.4–76.8)
NEUTROS ABS: 2.4 10*3/uL (ref 1.5–6.5)
Platelets: 101 10*3/uL — ABNORMAL LOW (ref 145–400)
RBC: 5.07 10*6/uL (ref 3.70–5.45)
RDW: 14.4 % (ref 11.2–14.5)
WBC: 26.2 10*3/uL — AB (ref 3.9–10.3)

## 2015-06-14 LAB — TECHNOLOGIST REVIEW

## 2015-06-14 MED ORDER — INFLUENZA VAC SPLIT QUAD 0.5 ML IM SUSY
0.5000 mL | PREFILLED_SYRINGE | Freq: Once | INTRAMUSCULAR | Status: AC
  Administered 2015-06-14: 0.5 mL via INTRAMUSCULAR
  Filled 2015-06-14: qty 0.5

## 2015-06-14 NOTE — Telephone Encounter (Signed)
Gave avs. Patient aware will call once schedule for Dr.Livesay is available.

## 2015-06-14 NOTE — Progress Notes (Signed)
OFFICE PROGRESS NOTE   June 14, 2015   Physicians:K.Brigitte Pulse (PCP), M.Olin, Sanders (ophth), J.Hayes, M.Edwinna Areola  INTERVAL HISTORY:  Patient is seen, alone for visit, in continuing attention to CLL for which she continues observation, also history of early stage left breast cancer 2004.  Most recent bilateral tomo mammograms at [facility] 11-23-14 heterogeneously dense breast tissue and benign calcifications on left, otherwise no mammographic findings of concern.  Next apt Dr Brigitte Pulse 11-2015   Patient was evaluated by Dr Sabra Heck in 02-2015 for intermittent spotting, post hysterectomy and left oophorectomy 1972. Exam by Dr Sabra Heck found no adnexal masses, atrophic tissue with some bleeding from vaginal PAP, bladder prolapse with skin changes along suture lines from previous bladder tack. PAP negative (below), UA microscopic negative. Bleeding was thought related to atrophic tissue and bladder prolapse, Dr Sabra Heck to follow up in Oct. Patient has had only minimal tiny spot of "dark" blood on panties since this evaluation.  Patient denies any illness since she was here last. She has had no other bleeding, no abdominal or pelvic pain, no unusual bruising. Energy seems at baseline. She has tried to decrease portion sizes, thinks appetite is at baseline, eats regular meals / no snacks. She is not aware of any increased adenopathy or changes in breasts. Patient reports ongoing sadness over death of son 33 years ago, young granddaughter 4 years ago, also husband with cardiac stent in Jan 2016 and memory problems such that she has to manage most of home activity. She worries about her daughter "that was her only child, who will take care of her when she is old?" She tries not to burden family with her grief, but cries at times.   ONCOLOGIC HISTORY  CLL was diagnosed in 2004 and has not required intervention; the slightly progressive thrombocytopenia has not been symptomatic. Abdominal US in Oct 2012 had normal  splenic volume. She had two short courses of prednisone at just 20 mg daily x 3 every other week in early 2013, tolerated without difficulty and some increase in platelets noted.   PMH is also significant for an early stage left breast cancer in 2004, treated with lumpectomy with sentinel node evaluation, local RT and 5 years of Femara thru 12-2007. She had bilateral 3D mammograms at [facility] 11-23-14, still heterogeneously dense breast tissue but without mamographic findings of concern.    Review of systems as above, also: No SOB or cough. No chest pain. Bowels move regularly. Seems able to empty bladder. No LE swelling. Is able to sleep. Remainder of 10 point Review of Systems negative.  Objective:  Vital signs in last 24 hours:  BP 143/86 mmHg  Pulse 77  Temp(Src) 97.4 F (36.3 C) (Oral)  Resp 18  Ht 5' 0.25" (1.53 m)  Wt 148 lb (67.132 kg)  BMI 28.68 kg/m2  SpO2 93% Weight down 3 lbs. Alert, oriented, tearful at times thru discussion above. Ambulatory carefully without assistance    HEENT:PERRL, sclerae not icteric. Oral mucosa moist without lesions, posterior pharynx clear.  Neck supple. No JVD.  Lymphatics:Right lower cervical nodes soft, not tender, ~ 2 cm diameter. No suraclavicular or inguinal adenopathy Resp: clear to auscultation bilaterally and normal percussion bilaterally Cardio: regular rate and rhythm. No gallop. GI: soft, nontender, not distended, no mass or organomegaly. Normally active bowel sounds.  Musculoskeletal/ Extremities: without pitting edema, cords, tenderness Neuro: speech fluent, moves all extremities equally, otherwise nonfocal Skin without rash, ecchymosis, petechiae Breasts: Left lumpectomy well healed without evidence of recurrence, otherwise  without dominant mass, skin or nipple findings. Axillae benign.  Lab Results:  Results for orders placed or performed in visit on 06/14/15  CBC with Differential  Result Value Ref Range   WBC 26.2 (H) 3.9  - 10.3 10e3/uL   NEUT# 2.4 1.5 - 6.5 10e3/uL   HGB 15.3 11.6 - 15.9 g/dL   HCT 46.9 (H) 34.8 - 46.6 %   Platelets 101 (L) 145 - 400 10e3/uL   MCV 92.6 79.5 - 101.0 fL   MCH 30.2 25.1 - 34.0 pg   MCHC 32.6 31.5 - 36.0 g/dL   RBC 5.07 3.70 - 5.45 10e6/uL   RDW 14.4 11.2 - 14.5 %   lymph# 23.2 (H) 0.9 - 3.3 10e3/uL   MONO# 0.6 0.1 - 0.9 10e3/uL   Eosinophils Absolute 0.1 0.0 - 0.5 10e3/uL   Basophils Absolute 0.0 0.0 - 0.1 10e3/uL   NEUT% 9.0 (L) 38.4 - 76.8 %   LYMPH% 88.5 (H) 14.0 - 49.7 %   MONO% 2.1 0.0 - 14.0 %   EOS% 0.2 0.0 - 7.0 %   BASO% 0.2 0.0 - 2.0 %    Variant lymphs and smudge cells present  Studies/Results: NAME: Abigail Moreno, PRIM PATHOLOGY#: [id number] SEX: F DOB: January 16, 1929 (Age: 81) MEDICAL RECORD NUMBER: [id number] DOCTOR: Edwinna Areola, M.D. DATE OBTAINED: 03/16/2015 CLIENT: [name] DATE RECEIVED: 03/17/2015 OTHER PHYS: DATE SIGNED: 03/18/2015 PAP Thinlayer with HPV Final Cytologic Interpretation: Cervical, ThinLayer with Automated Imaging and Dual Review, CPT 88175 Negative for Intraepithelial Lesions or Malignancy. ADEQUACY OF SPECIMEN: Satisfactory for evaluation.  Medications: I have reviewed the patient's current medications.Flu vaccine given today  DISCUSSION: Interval history reviewed as above. Counts stable from April, clinically stable from standpoint of CLL and remote breast cancer. She will see Dr Sabra Heck in Oct as planned. I will see her with labs in ~ 4 months; flu vaccine now and she knows to call physician promptly if symptoms of infection or significant bleeding. I have asked [place] chaplain to be in touch with her.  Assessment/Plan:  1.CLL: counts remain essentially stable, no complications, continuing observation. Platelet count responded well to short courses of steroids several years ago. I will see her in 4 months or sooner if needed.  2.history of early stage left breast cancer 2004, no known active disease. Mammograms [facility]  up to date, 3D appropriate due to dense breast tissue  3.small dark blood intermittently, likely related to previous bladder procedures/ recurrent prolapse/ atrophic tissue. Appreciate Dr Ammie Ferrier assistance.  4.right total hip replacement 12-2011, which has been very helpful.  5.flu vaccine done 6.Son(s) with aortic aneurysm 7.post hysterectomy and left BSO apparently for fibroid 1972 8.bladder tack procedures x 2 9.flu vaccine done 06-14-15 10. Social stressors with husband's early dementia and medical problems, still grieving for son and granddaughter, who died 62 and 4 years ago. Appreciate chaplain support.  All questions answered and she is pleased that blood counts are stable. Time spent 25 min including >50% counseling and coordination of care. Cc Dr Brigitte Pulse, Dr Megan Salon, MD   06/14/2015, 9:54 AM

## 2015-06-15 DIAGNOSIS — D696 Thrombocytopenia, unspecified: Secondary | ICD-10-CM | POA: Insufficient documentation

## 2015-06-15 DIAGNOSIS — Z23 Encounter for immunization: Secondary | ICD-10-CM | POA: Insufficient documentation

## 2015-06-15 DIAGNOSIS — F4321 Adjustment disorder with depressed mood: Secondary | ICD-10-CM | POA: Insufficient documentation

## 2015-06-15 DIAGNOSIS — Z90710 Acquired absence of both cervix and uterus: Secondary | ICD-10-CM | POA: Insufficient documentation

## 2015-07-01 ENCOUNTER — Encounter: Payer: Self-pay | Admitting: General Practice

## 2015-07-09 ENCOUNTER — Encounter: Payer: Self-pay | Admitting: Obstetrics & Gynecology

## 2015-07-09 ENCOUNTER — Ambulatory Visit (INDEPENDENT_AMBULATORY_CARE_PROVIDER_SITE_OTHER): Payer: Commercial Managed Care - HMO | Admitting: Obstetrics & Gynecology

## 2015-07-09 VITALS — BP 146/98 | HR 68 | Ht 60.25 in | Wt 147.0 lb

## 2015-07-09 DIAGNOSIS — N952 Postmenopausal atrophic vaginitis: Secondary | ICD-10-CM | POA: Diagnosis not present

## 2015-07-09 DIAGNOSIS — N950 Postmenopausal bleeding: Secondary | ICD-10-CM

## 2015-07-09 NOTE — Progress Notes (Signed)
Subjective:     Patient ID: Abigail Moreno, female   DOB: June 13, 1929, 81 y.o.   MRN: [id number]  HPI 81 yo G6P3 here for follow up.  Pt seen after having bright red vaginal spotting in June.  Pt has h/o hysterectomy and prior bladder repair, as well as shortened vagina.    Reports she has used coconut oil a little bit but reports this isn't as much as I advised.  She reports maybe she's used this six or seven times.    Pt reports she's had one additional spot of darkish discharge on Sept 1.  Denies any new bowel or bladder issues.  Review of Systems  All other systems reviewed and are negative.      Objective:   Physical Exam  Constitutional: She is oriented to person, place, and time. She appears well-developed and well-nourished.  Genitourinary: There is no rash, tenderness or lesion on the right labia. There is no rash, tenderness or lesion on the left labia.  Surgically absent uterus and cervix.  Shortened vaginal from prior surgery.  Suture line in midline has an area about 1/3rd way from introitus with vascular changes.  Possible source of blooding.  Do not want to proceed with biopsy for concerns regarding increased bleeding.    Lymphadenopathy:       Right: No inguinal adenopathy present.       Left: No inguinal adenopathy present.  Neurological: She is alert and oriented to person, place, and time.  Skin: Skin is warm and dry.  Psychiatric: She has a normal mood and affect.      Assessment:     Post menopausal bleeding Shortened vagina Negative Pap 03/16/15 Vascular changes along suture line, possible source of bleeding      Plan:     Pt advised to use vaginal olive oil or coconut oil faithfully for 2-3 months and then return for follow-up.  If vascular area appear any different, will proceed with biopsy.  Pt aware to call if has any significant vaginal bleeding.  All questions answered.

## 2015-08-04 ENCOUNTER — Telehealth: Payer: Self-pay | Admitting: Oncology

## 2015-08-04 NOTE — Telephone Encounter (Signed)
Appointments made and patient called °

## 2015-10-05 ENCOUNTER — Ambulatory Visit (INDEPENDENT_AMBULATORY_CARE_PROVIDER_SITE_OTHER): Payer: Commercial Managed Care - HMO | Admitting: Obstetrics & Gynecology

## 2015-10-05 VITALS — BP 130/86 | HR 78 | Resp 16 | Ht 60.25 in | Wt 148.0 lb

## 2015-10-05 DIAGNOSIS — R350 Frequency of micturition: Secondary | ICD-10-CM

## 2015-10-05 DIAGNOSIS — N898 Other specified noninflammatory disorders of vagina: Secondary | ICD-10-CM

## 2015-10-05 LAB — POCT URINALYSIS DIPSTICK
Bilirubin, UA: NEGATIVE
Glucose, UA: NEGATIVE
Ketones, UA: NEGATIVE
Leukocytes, UA: NEGATIVE
Nitrite, UA: NEGATIVE
PH UA: 5
PROTEIN UA: NEGATIVE
UROBILINOGEN UA: NEGATIVE

## 2015-10-05 NOTE — Progress Notes (Signed)
Subjective:     Patient ID: Abigail Moreno, female   DOB: 1929/04/13, 82 y.o.   MRN: [id number]  HPI 82 yo G6P3 MWF referred initially due to episode of PMP bleeding.  Pt's evaluation was negative except for a small purplish area on the mid vagina that appears vascular.  Has been using coconut oil regularly and is here for recheck.  Is aware of the possibility of needing a biopsy.    H/O TAH 1972 with later bladder repair.  Pt is unsure of surgery that was done for bladder repair/incontinence.  Neg pap smear 03/16/15.  Review of Systems  Constitutional: Negative.   Gastrointestinal: Negative.   Genitourinary: Negative.        Objective:   Physical Exam  Constitutional: She is oriented to person, place, and time. She appears well-developed and well-nourished.  Abdominal: Soft. Bowel sounds are normal.  Genitourinary: There is no rash, tenderness, lesion or injury on the right labia. There is no rash, tenderness, lesion or injury on the left labia.  Surgically absent uterus and cervix. BME without masses.  Shortened vaginal from prior surgery. Suture line in midline has an area about 1/3rd way from introitus with purplish lesion.  Appears to be a vascular change but unchanged from prior visits.    Lymphadenopathy:       Right: No inguinal adenopathy present.       Left: No inguinal adenopathy present.  Neurological: She is alert and oriented to person, place, and time.  Skin: Skin is warm and dry.  Psychiatric: She has a normal mood and affect.   D/W pt proceeding with biopsy.  Pt aware there may be bleeding and may need sutures.  Verbal consent obtained.  Vaginal cleansed with Betadine x 3.  Using biopsy forceps, lesion biopsies.  Bleeding present after biopsy confirming lesion was most likely just a vascular change.  Hemostasis obtained with monsels.  Pt tolerated procedure well and could void after procedure without difficulty.    Assessment:     Vaginal lesion, vascular and  probably from prior surgery H/O PMP bleeding that most likely came from this lesion Shortened vagina      Plan:     Biopsy will be sent the pathology and pt called with result.  Depending on any bleeding, pt may need follow up exam for recheck.  Precautions given for pt to call with any issues but to expect spotting for up to 10 days.

## 2015-10-06 ENCOUNTER — Telehealth: Payer: Self-pay | Admitting: Obstetrics & Gynecology

## 2015-10-06 NOTE — Telephone Encounter (Signed)
Patient was seen yesterday for vaginal biopsy. Patient says the sight is having a pinkish drainage. She was going to go to the emergency room but then it stopped. Patient wants to know if she needs to be seen today. Best # to reach [phone] (No Chart)

## 2015-10-06 NOTE — Telephone Encounter (Signed)
Spoke with patient. Patient was seen in the office on 10/05/2015 and had a vaginal biopsy performed. Last night the patient had bright red bleeding from the biopsy sight that turned into light pink spotting. This morning she has hold one spot of light pink blood from the site. Advised a small amount of spotting is normal for a few days after having a biopsy. Patient denies any heavy bleeding, fevers, or unusual drainage from the site. Advised it is okay to monitor the biopsy site and bleeding. If bleeding increases or continues past 48 hours she will need to be seen in the office for evaluation. If she develops any new symptoms will need to give the office a call as well. Patient is agreeable and verbalizes understanding.  Cc: Dr.Miller  Routing to covering ider for final review. Patient agreeable to disposition. Will close encounter.

## 2015-10-08 ENCOUNTER — Telehealth: Payer: Self-pay | Admitting: Emergency Medicine

## 2015-10-08 NOTE — Telephone Encounter (Signed)
Patient called and she is given results from Dr. Sabra Heck. She states she has had some slight vaginal bleeding and noticed a red tinge of color on tissue after voiding. Denies pain with urination. She states she does have a hemorrhoid that is bleeding as well.   Patient denies heavy vaginal bleeding.   Advised will send message to Dr. Sabra Heck for further advice on how to proceed. Patient advised to call back with increase in bleeding. Patient agreeable.

## 2015-10-08 NOTE — Telephone Encounter (Signed)
-----   Message from Megan Salon, MD sent at 10/08/2015 12:51 PM EST ----- Please let the pt know the biopsy was negative.  The pathology showed a vascular change called a hemangioma.  Has she has any other bleeding?  Thanks.

## 2015-10-11 NOTE — Telephone Encounter (Signed)
I'd like to have a recheck sometime this week.  Thanks.

## 2015-10-11 NOTE — Telephone Encounter (Signed)
Call to patient and message from Dr. Sabra Heck given.  Patient scheduled for office visit on 10/15/15 with Dr. Sabra Heck (patient choice of date) for evaluation. Patient advised to call back with any further bleeding and is agreeable. Routing to provider for final review. Patient agreeable to disposition. Will close encounter.

## 2015-10-15 ENCOUNTER — Ambulatory Visit (INDEPENDENT_AMBULATORY_CARE_PROVIDER_SITE_OTHER): Payer: Commercial Managed Care - HMO | Admitting: Obstetrics & Gynecology

## 2015-10-15 ENCOUNTER — Encounter: Payer: Self-pay | Admitting: Obstetrics & Gynecology

## 2015-10-15 VITALS — BP 140/96 | HR 82 | Resp 16 | Ht 60.25 in | Wt 146.0 lb

## 2015-10-15 DIAGNOSIS — D1800 Hemangioma unspecified site: Secondary | ICD-10-CM

## 2015-10-15 NOTE — Progress Notes (Signed)
Subjective:     Patient ID: Abigail Moreno, female   DOB: February 26, 1929, 82 y.o.   MRN: [id number]  HPI 82 yo G6P3 MWF here for follow up after having a vaginal biopsy of a purplish lesion that bled after the biopsy.  Pathology was negative and showed either a fibroepithelial polyp vs hemangioma.  Physical exam findings are most consistent with the latter.  Pt aware no treatment is needed.  She did have a little bleeding within 24 after the biopsy but none since.  Denies any other symptoms.  Review of Systems  Genitourinary: Negative.        Objective:   Physical Exam  Constitutional: She is oriented to person, place, and time. She appears well-developed and well-nourished.  Genitourinary: Vagina normal.    There is no rash, tenderness, lesion or injury on the right labia. There is no rash, tenderness, lesion or injury on the left labia.  Neurological: She is alert and oriented to person, place, and time.  Psychiatric: She has a normal mood and affect.       Assessment:     S/P vaginal biopsy of hemangioma     Plan:     Pt aware site is healing well and nothing else is needed for treatment or follow-up, unless she is having new issues. Soap and water, only, for cleansing biopsy site.

## 2015-10-19 ENCOUNTER — Encounter: Payer: Self-pay | Admitting: Obstetrics & Gynecology

## 2015-10-20 ENCOUNTER — Other Ambulatory Visit: Payer: Self-pay | Admitting: Oncology

## 2015-10-20 ENCOUNTER — Other Ambulatory Visit: Payer: Self-pay

## 2015-10-20 DIAGNOSIS — C9110 Chronic lymphocytic leukemia of B-cell type not having achieved remission: Secondary | ICD-10-CM

## 2015-10-21 ENCOUNTER — Encounter: Payer: Self-pay | Admitting: Oncology

## 2015-10-21 ENCOUNTER — Ambulatory Visit (HOSPITAL_BASED_OUTPATIENT_CLINIC_OR_DEPARTMENT_OTHER): Payer: Commercial Managed Care - HMO | Admitting: Oncology

## 2015-10-21 ENCOUNTER — Telehealth: Payer: Self-pay

## 2015-10-21 ENCOUNTER — Telehealth: Payer: Self-pay | Admitting: Oncology

## 2015-10-21 ENCOUNTER — Other Ambulatory Visit (HOSPITAL_BASED_OUTPATIENT_CLINIC_OR_DEPARTMENT_OTHER): Payer: Commercial Managed Care - HMO

## 2015-10-21 VITALS — BP 154/86 | HR 74 | Temp 97.4°F | Resp 17 | Ht 60.25 in | Wt 148.1 lb

## 2015-10-21 DIAGNOSIS — C9110 Chronic lymphocytic leukemia of B-cell type not having achieved remission: Secondary | ICD-10-CM

## 2015-10-21 DIAGNOSIS — Z853 Personal history of malignant neoplasm of breast: Secondary | ICD-10-CM

## 2015-10-21 DIAGNOSIS — D696 Thrombocytopenia, unspecified: Secondary | ICD-10-CM | POA: Diagnosis not present

## 2015-10-21 LAB — CBC WITH DIFFERENTIAL/PLATELET
BASO%: 0.2 % (ref 0.0–2.0)
BASOS ABS: 0.1 10*3/uL (ref 0.0–0.1)
EOS%: 0.4 % (ref 0.0–7.0)
Eosinophils Absolute: 0.1 10*3/uL (ref 0.0–0.5)
HCT: 45.7 % (ref 34.8–46.6)
HEMOGLOBIN: 15 g/dL (ref 11.6–15.9)
LYMPH#: 24.8 10*3/uL — AB (ref 0.9–3.3)
LYMPH%: 89 % — ABNORMAL HIGH (ref 14.0–49.7)
MCH: 30.2 pg (ref 25.1–34.0)
MCHC: 32.8 g/dL (ref 31.5–36.0)
MCV: 92 fL (ref 79.5–101.0)
MONO#: 0.6 10*3/uL (ref 0.1–0.9)
MONO%: 2.2 % (ref 0.0–14.0)
NEUT#: 2.3 10*3/uL (ref 1.5–6.5)
NEUT%: 8.2 % — AB (ref 38.4–76.8)
Platelets: 97 10*3/uL — ABNORMAL LOW (ref 145–400)
RBC: 4.97 10*6/uL (ref 3.70–5.45)
RDW: 14.3 % (ref 11.2–14.5)
WBC: 27.9 10*3/uL — ABNORMAL HIGH (ref 3.9–10.3)
nRBC: 0 % (ref 0–0)

## 2015-10-21 LAB — COMPREHENSIVE METABOLIC PANEL
ALBUMIN: 3.7 g/dL (ref 3.5–5.0)
ALK PHOS: 93 U/L (ref 40–150)
ALT: 13 U/L (ref 0–55)
ANION GAP: 9 meq/L (ref 3–11)
AST: 19 U/L (ref 5–34)
BUN: 15.6 mg/dL (ref 7.0–26.0)
CALCIUM: 9.5 mg/dL (ref 8.4–10.4)
CO2: 28 mEq/L (ref 22–29)
Chloride: 106 mEq/L (ref 98–109)
Creatinine: 0.8 mg/dL (ref 0.6–1.1)
EGFR: 73 mL/min/{1.73_m2} — AB (ref >90)
Glucose: 85 mg/dl (ref 70–140)
POTASSIUM: 4.3 meq/L (ref 3.5–5.1)
Sodium: 142 mEq/L (ref 136–145)
Total Bilirubin: 0.69 mg/dL (ref 0.20–1.20)
Total Protein: 6.9 g/dL (ref 6.4–8.3)

## 2015-10-21 LAB — TECHNOLOGIST REVIEW

## 2015-10-21 MED ORDER — NYSTATIN 100000 UNIT/GM EX POWD: CUTANEOUS | Status: DC

## 2015-10-21 MED ORDER — ACYCLOVIR 5 % EX OINT: TOPICAL_OINTMENT | CUTANEOUS | Status: DC

## 2015-10-21 NOTE — Telephone Encounter (Signed)
Appointments made and avs printed for patient °

## 2015-10-21 NOTE — Telephone Encounter (Signed)
-----   Message from Gordy Levan, MD sent at 10/21/2015 11:19 AM EST ----- Zovirax ointment to fever blister 5x daily.  1 tube 2 RF  Please tell pharmacist she also needs mycostatin or nystatin powder, OTC ok  thanks

## 2015-10-21 NOTE — Progress Notes (Signed)
OFFICE PROGRESS NOTE   October 21, 2015   Physicians:K.Brigitte Pulse (PCP), M.Olin, Sanders (ophth), J.Hayes, M.Edwinna Areola  INTERVAL HISTORY:   Patient is seen, alone for visit, in scheduled follow up of CLL and history of early stage left breast cancer. CLL has not been treated other than short course of steroids for thrombocytopenia, with improvement. Last mammograms were 3D at [facility] 11-23-14, heterogeneously dense but otherwise no mammographic findings of concern, will be repeated 11-2015.  She has mild thrombocytopenia, which improved with steroids in past and which has not been symptomatic otherwise, including no overt bleeding and no unusual bruising. She has had no systemic infectious illness, no fevers. She is not aware of any change in adenopathy, no painful nodes or symptomatic HSM. Energy is at baseline, appetite good. She is managing stress some better, however husband with some dementia is to have hip surgery by Dr Alvan Dame in March. She has macular degeneration OD; now decreased vision OS x 2 weeks with early changes also on that side, ophthalmology following. Vaginal biopsy of purplish lesion by Dr Sabra Heck 09-2015 showed hemangioma, did bleed locally following that biopsy. Irritated skin rash beneath right breast, worse with hydrocortisone cream. Possible early herpetic lesion left lip. No abdominal or pelvic pain. Bowels moving as usual. Bladder unchanged. No swelling LE.  Remainder of 10 point Review of Systems negative/ unchanged.    No central catheter Flu vaccine 06-14-15  ONCOLOGIC HISTORY CLL was diagnosed in 2004 and has not required intervention; the slightly progressive thrombocytopenia has not been symptomatic. Abdominal US in Oct 2012 had normal splenic volume. She had two short courses of prednisone at just 20 mg daily x 3 every other week in early 2013, tolerated without difficulty and some increase in platelets noted.   PMH is also significant for an early stage left breast  cancer in 2004, treated with lumpectomy with sentinel node evaluation, local RT and 5 years of Femara thru 12-2007. She had bilateral 3D mammograms at [facility] 11-23-14, still heterogeneously dense breast tissue but without mamographic findings of concern.    Objective:  Vital signs in last 24 hours:  BP 154/86 mmHg  Pulse 74  Temp(Src) 97.4 F (36.3 C) (Oral)  Resp 17  Ht 5' 0.25" (1.53 m)  Wt 148 lb 1.6 oz (67.178 kg)  BMI 28.70 kg/m2  SpO2 94% Weight stable. Elderly, frail appearing lady but excellent historian. Alert, oriented and appropriate. Ambulatory without assistance. Respirations not labored RA.  HEENT:PERRL, sclerae not icteric. No clear herpetic lesion visible where slight tenderness upper lip lateral left. Oral mucosa moist without lesions, posterior pharynx clear.  Neck supple. No JVD.  Lymphatics:  Right lower cervical nodes soft, 2-3 cm.  no suraclavicular adenopathy Resp: clear to auscultation bilaterally and normal percussion bilaterally Cardio: regular rate and rhythm. No gallop. GI: soft, nontender, not distended, splenomegaly not appreciated. Normally active bowel sounds.. Musculoskeletal/ Extremities: without pitting edema, cords, tenderness Neuro: no peripheral neuropathy. Otherwise nonfocal Skin beneath right breast superficial yeast dermatitis, otherwise without rash, ecchymosis, petechiae Breasts: left lumpectomy site well healed without evidence of local recurrence, otherwise bilaterally without dominant mass, skin or nipple findings. Axillae benign.   Lab Results:  Results for orders placed or performed in visit on 10/21/15  TECHNOLOGIST REVIEW  Result Value Ref Range   Technologist Review Variant lymphs and smudge cells present   CBC with Differential  Result Value Ref Range   WBC 27.9 (H) 3.9 - 10.3 10e3/uL   NEUT# 2.3 1.5 - 6.5 10e3/uL  HGB 15.0 11.6 - 15.9 g/dL   HCT 45.7 34.8 - 46.6 %   Platelets 97 (L) 145 - 400 10e3/uL   MCV 92.0 79.5 -  101.0 fL   MCH 30.2 25.1 - 34.0 pg   MCHC 32.8 31.5 - 36.0 g/dL   RBC 4.97 3.70 - 5.45 10e6/uL   RDW 14.3 11.2 - 14.5 %   lymph# 24.8 (H) 0.9 - 3.3 10e3/uL   MONO# 0.6 0.1 - 0.9 10e3/uL   Eosinophils Absolute 0.1 0.0 - 0.5 10e3/uL   Basophils Absolute 0.1 0.0 - 0.1 10e3/uL   NEUT% 8.2 (L) 38.4 - 76.8 %   LYMPH% 89.0 (H) 14.0 - 49.7 %   MONO% 2.2 0.0 - 14.0 %   EOS% 0.4 0.0 - 7.0 %   BASO% 0.2 0.0 - 2.0 %   nRBC 0 0 - 0 %  Comprehensive metabolic panel  Result Value Ref Range   Sodium 142 136 - 145 mEq/L   Potassium 4.3 3.5 - 5.1 mEq/L   Chloride 106 98 - 109 mEq/L   CO2 28 22 - 29 mEq/L   Glucose 85 70 - 140 mg/dl   BUN 15.6 7.0 - 26.0 mg/dL   Creatinine 0.8 0.6 - 1.1 mg/dL   Total Bilirubin 0.69 0.20 - 1.20 mg/dL   Alkaline Phosphatase 93 40 - 150 U/L   AST 19 5 - 34 U/L   ALT 13 0 - 55 U/L   Total Protein 6.9 6.4 - 8.3 g/dL   Albumin 3.7 3.5 - 5.0 g/dL   Calcium 9.5 8.4 - 10.4 mg/dL   Anion Gap 9 3 - 11 mEq/L   EGFR 73 (L) >90 ml/min/1.73 m2     Studies/Results:  No results found.  Medications: I have reviewed the patient's current medications. Mycostatin powder under breasts, keep area dry, no hydrocortisone cream. Zovirax ointment for herpetic oral lesions prn.  DISCUSSION Thrombocytopenia with platelets slightly lower but seems asymptomatic. Will recheck 3 months. Avoid ASA, NSAID. Otherwise no indication to change observation for CLL yet.  Meds as above Mammograms [facility] 2022-11-28.   Assessment/Plan:  1.CLL in 82 yo lady: counts remain essentially stable, no complications, continuing observation. Platelet count responded well to short courses of steroids several years ago. I will see her in 3 months or sooner if needed.  2.history of early stage left breast cancer 2004, no known active disease. Mammograms [facility] upcoming, 3D appropriate due to dense breast tissue  3.vaginal hemangioma causing previous spotting.  4.right total hip replacement 12-2011, which has  been very helpful.  5.flu vaccine done 6.Son(s) with aortic aneurysm; one son died ~ 2 years ago. 7.post hysterectomy and left BSO apparently for fibroid 1972 . bladder tack procedures x 2 8.macular degeneration 9.flu vaccine done 06-14-15 10. Social stressors with husband's early dementia and planned hip surgery 11/28/15. Death of granddaughter 4 years ago.   All questions answered; patient understands discussion and is in agreement with continued observation, will call if needed prior to next scheduled visit. TIme spent 20 min including >50% counseling and coordination of care.  Cc Dr Corene Cornea, MD   10/21/2015, 3:48 PM

## 2015-10-22 ENCOUNTER — Encounter: Payer: Self-pay | Admitting: Oncology

## 2015-10-22 NOTE — Progress Notes (Signed)
Sent prior auth to [facility] for acyclovir oitment

## 2015-10-23 DIAGNOSIS — Z853 Personal history of malignant neoplasm of breast: Secondary | ICD-10-CM | POA: Insufficient documentation

## 2015-10-25 ENCOUNTER — Encounter: Payer: Self-pay | Admitting: Oncology

## 2015-10-25 NOTE — Progress Notes (Signed)
Acyclovir was denied by [employer]. I faxed notes for appeal [phone]

## 2015-10-27 ENCOUNTER — Encounter: Payer: Self-pay | Admitting: Oncology

## 2015-10-27 NOTE — Progress Notes (Signed)
Per [employer] acyclovir was approved 10/27/15-10/26/16. Ref# [id number]. I sent to medical records

## 2015-10-27 NOTE — Progress Notes (Signed)
[employer] wants to know what was tried and failed? Trying to get prior auth for acyclovir. I sent message to nurse.

## 2015-11-02 ENCOUNTER — Encounter: Payer: Self-pay | Admitting: Oncology

## 2015-11-02 NOTE — Progress Notes (Signed)
Per Ann [place] ref# [id number]  Case# [id number]. She indicated it was only approved for one day in error--that is why when [facility] ran it denied. She said [place] dept is correcting and I can call 11/03/15 to ck status before letting [facility] know.

## 2015-11-03 ENCOUNTER — Telehealth: Payer: Self-pay

## 2015-11-03 DIAGNOSIS — S00521S Blister (nonthermal) of lip, sequela: Secondary | ICD-10-CM

## 2015-11-03 MED ORDER — ACYCLOVIR 200 MG PO CAPS: 200.0000 mg | ORAL_CAPSULE | Freq: Three times a day (TID) | ORAL | Status: DC

## 2015-11-03 NOTE — Telephone Encounter (Signed)
Patient Demographics     Patient Name Sex DOB SSN Address Phone    Gudelia, Abler Female July 14, 1929 [id number] [address] [place] [address] [phone] (Home) *Preferred*       RE: tried and failed  Received: 6 days ago    Zaylister R Browning  Gordy Levan, MD; Baruch Merl, RN; Janace Hoard, RN           Hey ladies,  It was approved.  Thanks Dr. Marko Plume,  Raquel       Previous Messages     ----- Message -----   From: Gordy Levan, MD   Sent: 10/27/2015  7:34 PM    To: Baruch Merl, RN, Janace Hoard, RN, *  Subject: RE: tried and failed               Did not try anything before this acyclovir ointment.   RN please send script for acyclovir tablets 200 mg tid as instructed #30 1 RF - hopefully insurance will cover tablets  Tell patient to start these if fever blister starts and call us for further instructions if she needs to start   thanks  ----- Message -----   From: Baruch Merl, RN   Sent: 10/27/2015  5:36 PM    To: Janace Hoard, RN, Gordy Levan, MD  Subject: FW: tried and failed               Pt was to use this for fever blisters as noted at 10-21-15 visit.  Barbaraann Share  ----- Message -----   From: Mariam Dollar   Sent: 10/27/2015  1:02 PM    To: Baruch Merl, RN  Subject: tried and failed                 Tamera Reason needs to know what was tried and failed. Acyclovir was denied and they need for review.  Thanks,  Raquel

## 2015-11-03 NOTE — Telephone Encounter (Signed)
Spoke with Research scientist (medical).  He tried to run the acyclovir ointment prescription  and it was again rejected.  Sent in Acyclovir 200 mg tabs  1 po tid as instructed as noted in Dr. Mariana Kaufman note below dated 10-27-15. Told Ms. Vanleer the information regarding tabs vs ointment.  She will call the office for further instructions if she needs to begin the acyclovir tabs.

## 2015-11-03 NOTE — Telephone Encounter (Deleted)
-----   Message from Mariam Dollar sent at 11/02/2015  3:46 PM EST ----- Regarding: acyclovir Erling Conte, See below. It as approved but only for one day in error. They are correcting and should be ok to run again on 11/03/15   Per [place] ref# [id number]  Case# [id number]. She indicated it was only approved for one day in error--that is why when [facility] ran it denied. She said [place] dept is correcting and I can call 11/03/15 to ck status before letting [facility] know.   Thanks, Raquel

## 2015-11-03 NOTE — Telephone Encounter (Signed)
Patient Demographics     Patient Name Sex DOB SSN Address Phone    Abigail Moreno, Abigail Moreno Female 03/04/1929 [id number] [address] [place] [address] [phone] (Home) *Preferred*      acyclovir  Received: Yesterday    Abigail R Paulina Fusi, RN           Erling Conte,  See below. It as approved but only for one day in error. They are correcting and should be ok to run again on 11/03/15    Per [place] ref# [id number] Case# [id number]. She indicated it was only approved for one day in error--that is why when [facility] ran it denied. She said [place] dept is correcting and I can call 11/03/15 to ck status before letting [facility] know.   Thanks,  Raquel

## 2015-12-16 ENCOUNTER — Encounter: Payer: Self-pay | Admitting: Oncology

## 2016-01-19 ENCOUNTER — Other Ambulatory Visit: Payer: Self-pay | Admitting: Oncology

## 2016-01-20 ENCOUNTER — Ambulatory Visit: Payer: Commercial Managed Care - HMO | Admitting: Oncology

## 2016-01-20 ENCOUNTER — Other Ambulatory Visit: Payer: Commercial Managed Care - HMO

## 2016-01-20 ENCOUNTER — Telehealth: Payer: Self-pay | Admitting: *Deleted

## 2016-01-20 NOTE — Telephone Encounter (Signed)
VM message received from pt @ 8:20 am regarding her appointment with Dr. Marko Plume this morning. TC back to patient. She states that she woke up this morning with some diarrhea (2 x) and is somewhat reluctant to come in this morning because of that-worried that she would have an "accident". Denies fever, chills etc. Feels well otherwise. She wants to know if she can come in this afternoon or on Monday instead.  Advised to increase fluid intake and try Imodium if diarrhea persists.

## 2016-01-21 NOTE — Telephone Encounter (Signed)
Spoke with Abigail Moreno and she feels better today. No diarrhea. Told her Dr. Marko Plume said she can r/s appointment with in the next month.  R/s patient to 03-16-16.

## 2016-03-12 ENCOUNTER — Other Ambulatory Visit: Payer: Self-pay | Admitting: Oncology

## 2016-03-16 ENCOUNTER — Encounter: Payer: Self-pay | Admitting: Oncology

## 2016-03-16 ENCOUNTER — Other Ambulatory Visit (HOSPITAL_BASED_OUTPATIENT_CLINIC_OR_DEPARTMENT_OTHER): Payer: Commercial Managed Care - HMO

## 2016-03-16 ENCOUNTER — Telehealth: Payer: Self-pay | Admitting: Hematology and Oncology

## 2016-03-16 ENCOUNTER — Ambulatory Visit (HOSPITAL_BASED_OUTPATIENT_CLINIC_OR_DEPARTMENT_OTHER): Payer: Commercial Managed Care - HMO | Admitting: Oncology

## 2016-03-16 VITALS — BP 170/97 | HR 74 | Temp 97.5°F | Resp 17 | Ht 60.25 in | Wt 146.7 lb

## 2016-03-16 DIAGNOSIS — Z853 Personal history of malignant neoplasm of breast: Secondary | ICD-10-CM | POA: Diagnosis not present

## 2016-03-16 DIAGNOSIS — D693 Immune thrombocytopenic purpura: Secondary | ICD-10-CM

## 2016-03-16 DIAGNOSIS — C9110 Chronic lymphocytic leukemia of B-cell type not having achieved remission: Secondary | ICD-10-CM

## 2016-03-16 DIAGNOSIS — D696 Thrombocytopenia, unspecified: Secondary | ICD-10-CM

## 2016-03-16 LAB — CBC WITH DIFFERENTIAL/PLATELET
BASO%: 0.2 % (ref 0.0–2.0)
Basophils Absolute: 0.1 10*3/uL (ref 0.0–0.1)
EOS%: 0.4 % (ref 0.0–7.0)
Eosinophils Absolute: 0.1 10*3/uL (ref 0.0–0.5)
HEMATOCRIT: 45.2 % (ref 34.8–46.6)
HEMOGLOBIN: 15 g/dL (ref 11.6–15.9)
LYMPH#: 25.6 10*3/uL — AB (ref 0.9–3.3)
LYMPH%: 89.1 % — ABNORMAL HIGH (ref 14.0–49.7)
MCH: 30.8 pg (ref 25.1–34.0)
MCHC: 33.2 g/dL (ref 31.5–36.0)
MCV: 92.8 fL (ref 79.5–101.0)
MONO#: 0.6 10*3/uL (ref 0.1–0.9)
MONO%: 2.1 % (ref 0.0–14.0)
NEUT%: 8.2 % — AB (ref 38.4–76.8)
NEUTROS ABS: 2.4 10*3/uL (ref 1.5–6.5)
PLATELETS: 96 10*3/uL — AB (ref 145–400)
RBC: 4.87 10*6/uL (ref 3.70–5.45)
RDW: 13.9 % (ref 11.2–14.5)
WBC: 28.8 10*3/uL — ABNORMAL HIGH (ref 3.9–10.3)

## 2016-03-16 LAB — MORPHOLOGY: PLT EST: DECREASED

## 2016-03-16 LAB — CHCC SMEAR

## 2016-03-16 NOTE — Progress Notes (Signed)
OFFICE PROGRESS NOTE   March 21, 2016   Physicians: K.Brigitte Pulse (PCP), M.Olin, Sanders (ophth), J.Hayes, M.Edwinna Areola  INTERVAL HISTORY:   Patient is seen, alone for visit, in continuing attention to CLL and remote left breast cancer. CLL has not required treatment since diagnosis in 2004, tho she had improvement in platelets in past with steroids, consistent with immune thrombocytopenia.  She had bilateral tomo mammograms at [facility] 11-24-15, with heterogeneously dense breast tissue but otherwise no mammographic findings of concern.  Last saw PCP in March.  Patient has felt at fairly good baseline since she was here last, other than "fluid in right ear" for which she has tried flonase without improvement. She has no ear pain, no marked sinus congestion. She has had no infectious illness, no noted increase in adenopathy, no bleeding or unusual bruising, no abdominal pain, no night sweats. Energy is not great but stable. She had one episode of diarrhea in May, rescheduled this visit then, symptoms quickly resolved without intervention and bowels fine since then. No noted changes in breasts. No LE swelling. No change in vision recently with macular degeneration.    No central catheter Flu vaccine yearly  Husband did well with hip surgery this spring. 57 yo daughter now with hip problems.   ONCOLOGIC HISTORY CLL was diagnosed in 2004 and has not required intervention; the slightly progressive thrombocytopenia has not been symptomatic. Abdominal US in Oct 2012 had normal splenic volume. Platelets have been 90-100k back to 2011.  She had two short courses of prednisone at just 20 mg daily x 3 every other week in early 2013, tolerated without difficulty and some increase in platelets noted.   PMH is also significant for an early stage left breast cancer in 2004, treated with lumpectomy with sentinel node evaluation, local RT and 5 years of Femara thru 12-2007. She had bilateral 3D mammograms at [facility]  11-24-15, still heterogeneously dense breast tissue but without mamographic findings of concern.     Objective:  Vital signs in last 24 hours:  BP 170/97 mmHg  Pulse 74  Temp(Src) 97.5 F (36.4 C) (Oral)  Resp 17  Ht 5' 0.25" (1.53 m)  Wt 146 lb 11.2 oz (66.543 kg)  BMI 28.43 kg/m2  SpO2 95% Weight down 2 lbs Elderly lady who is alert, oriented and appropriate, very pleasant and good historian. Ambulatory without assistance.  Partial alopecia  HEENT:PERRL, sclerae not icteric. Oral mucosa moist without lesions, posterior pharynx clear. Ear canals dry with some irritation in distal right canal, drum a little dull no erythema or bulging, very dry wax.  Neck supple. No JVD.  Lymphatics: soft lower cervical nodes up to ~ 1.5 - 2 cm, no suraclavicular, axillary adenopathy Resp: clear to auscultation bilaterally and normal percussion bilaterally Cardio: regular rate and rhythm. No gallop. GI: abdomen soft, nontender, protuberant but not otherwise distended, no mass or organomegaly including no appreciable splenomegaly. Normally active bowel sounds.  Musculoskeletal/ Extremities: without pitting edema, cords, tenderness Neuro: speech fluent, moves all extremities equally, otherwise nonfocal. PSYCH appropriate mood and affect Skin without rash, ecchymosis, petechiae Breasts: left lumpectomy scar well healed, otherwise bilaterally without dominant mass, skin or nipple findings. Axillae benign.   Lab Results:  Results for orders placed or performed in visit on 03/16/16  CBC with Differential  Result Value Ref Range   WBC 28.8 (H) 3.9 - 10.3 10e3/uL   NEUT# 2.4 1.5 - 6.5 10e3/uL   HGB 15.0 11.6 - 15.9 g/dL   HCT 45.2 34.8 -  46.6 %   Platelets 96 (L) 145 - 400 10e3/uL   MCV 92.8 79.5 - 101.0 fL   MCH 30.8 25.1 - 34.0 pg   MCHC 33.2 31.5 - 36.0 g/dL   RBC 4.87 3.70 - 5.45 10e6/uL   RDW 13.9 11.2 - 14.5 %   lymph# 25.6 (H) 0.9 - 3.3 10e3/uL   MONO# 0.6 0.1 - 0.9 10e3/uL    Eosinophils Absolute 0.1 0.0 - 0.5 10e3/uL   Basophils Absolute 0.1 0.0 - 0.1 10e3/uL   NEUT% 8.2 (L) 38.4 - 76.8 %   LYMPH% 89.1 (H) 14.0 - 49.7 %   MONO% 2.1 0.0 - 14.0 %   EOS% 0.4 0.0 - 7.0 %   BASO% 0.2 0.0 - 2.0 %  Morphology  Result Value Ref Range   Ovalocytes Few Negative   Smudge Cells Mod Negative   White Cell Comments Variant Lymphs    PLT EST Decreased Adequate   Platelet Morphology Large Platelets Within Normal Limits  Smear  Result Value Ref Range   Smear Result Smear Available    Morphology/ smear available after visit and we will let patient know by phone.   Studies/Results:  Report of bilateral tomo mammograms from [facility] 11-24-15 scanned into this EMR Last [place] Abd 2012 no splenomegaly  Medications: I have reviewed the patient's current medications.  DISCUSSION Discussed reasons for low platelets with CLL: extensive CLL in marrow with inadequate production which is best confirmed by bone marrow exam, splenomegaly with sequestration, or immune thrombocytopenia. Review of counts back to 2011 some variability but often 90-100k. Review of peripheral smear today shows large platelets consistent with ITP picture. She is not symptomatic from low platelets or otherwise that I can tell, and tolerated short courses of steroids poorly in past, so plan to continue to follow without treatment now.   Assessment/Plan:   1.CLL in 82 yo lady: counts remain essentially stable, no complications, continuing observation. Large platelets seen on peripheral smear still consistent with mild ITP picture; platelet count responded well to short courses of steroids several years ago, but no indication to repeat steroids now. I will see her in 4 months or sooner if needed.  2.history of early stage left breast cancer 2004, no known active disease. Mammograms [facility]  3D appropriate due to dense breast tissue, no mammographic findings of concern and exam unremarkable now  3.vaginal hemangioma  causing previous spotting.  4.right total hip replacement 12-2011  5.flu vaccine done 6.Son(s) with aortic aneurysm; one son died ~ 57 years ago. 7.post hysterectomy and left BSO apparently for fibroid 1972 . bladder tack procedures x 2 8.macular degeneration 9.flu vaccine needed yearly 10. Social stressors with husband's early dementia and recent hip surgery. Death of only grandchild 4 years ago. 11.right ear symptoms: suggested mineral oil a couple of times weekly   All questions answered at time of visit and we will follow up rest of CBC morphology information by phone. TIme spent 20 min including >50% counseling and coordination of care. Route Dr Corene Cornea, MD   03/21/2016, 10:37 AM

## 2016-03-16 NOTE — Telephone Encounter (Signed)
appt made and avs printed °

## 2016-03-17 ENCOUNTER — Telehealth: Payer: Self-pay

## 2016-03-17 NOTE — Telephone Encounter (Signed)
-----   Message from Gordy Levan, MD sent at 03/17/2016 12:46 PM EDT ----- Labs seen and need follow up: please let patient know that the blood slide under microscope looks like the mildly low platelets are caused by immune reaction rather than from CLL directly. I do not think she needs bone marrow test now and do not think she needs treatment for the CLL now. We will watch the platelets. Keep appointments as planned, call if any significant bleeding or unusual bruising. This is a better situation than if the CLL was worse, and is just what she has had in the past.   thanks

## 2016-03-17 NOTE — Telephone Encounter (Signed)
Left shortened report on voice mail. Told her to call for full report.

## 2016-06-07 ENCOUNTER — Encounter: Payer: Self-pay | Admitting: General Practice

## 2016-06-07 NOTE — Progress Notes (Signed)
[place] Spiritual Care Note  Left VM to offer Spiritual Care check-in.  Encouraged pt to return call.   [place], [place], [facility] Pager [phone] Voicemail [phone]

## 2016-06-09 ENCOUNTER — Encounter: Payer: Self-pay | Admitting: General Practice

## 2016-06-09 NOTE — Progress Notes (Signed)
[facility] Spiritual Care Note  Attempted f/u call, but again no answer.  Left detailed message of support and encouragement.   [place], [place], [facility] Pager [phone] Voicemail [phone]

## 2016-06-09 NOTE — Progress Notes (Signed)
[place] Spiritual Care Note  Received return call from Ms Duggar, who reports that she "[feels] good 90% of the time, [has] a great husband and energy to cook and clean," causing her to feel grateful and enjoy a good QOL.  She and her husband have been members of [employer] for ca 60 years and have good support there.  She notes that she does "have sad days, too, of course," especially about the deaths of her son and granddaughter (her daughter's only child).  She finds comfort then in "thinking about positive things," using her support system, acknowledging that she can't fix grief, and knowing that her daughter is getting helpful grief counseling.  Pt verbalizes gratitude for [facility] support as well and is aware of ongoing Support Team availability as desired.   [place], [place], [facility] Pager [phone] Voicemail [phone]

## 2016-07-15 ENCOUNTER — Other Ambulatory Visit: Payer: Self-pay | Admitting: Oncology

## 2016-07-17 ENCOUNTER — Other Ambulatory Visit (HOSPITAL_BASED_OUTPATIENT_CLINIC_OR_DEPARTMENT_OTHER): Payer: Commercial Managed Care - HMO

## 2016-07-17 ENCOUNTER — Ambulatory Visit (HOSPITAL_BASED_OUTPATIENT_CLINIC_OR_DEPARTMENT_OTHER): Payer: Commercial Managed Care - HMO | Admitting: Oncology

## 2016-07-17 ENCOUNTER — Encounter: Payer: Self-pay | Admitting: Oncology

## 2016-07-17 VITALS — BP 166/97 | HR 75 | Temp 97.5°F | Resp 17 | Ht 60.25 in | Wt 148.3 lb

## 2016-07-17 DIAGNOSIS — D696 Thrombocytopenia, unspecified: Secondary | ICD-10-CM

## 2016-07-17 DIAGNOSIS — K649 Unspecified hemorrhoids: Secondary | ICD-10-CM

## 2016-07-17 DIAGNOSIS — Z9109 Other allergy status, other than to drugs and biological substances: Secondary | ICD-10-CM | POA: Diagnosis not present

## 2016-07-17 DIAGNOSIS — K625 Hemorrhage of anus and rectum: Secondary | ICD-10-CM | POA: Diagnosis not present

## 2016-07-17 DIAGNOSIS — C9110 Chronic lymphocytic leukemia of B-cell type not having achieved remission: Secondary | ICD-10-CM | POA: Diagnosis not present

## 2016-07-17 DIAGNOSIS — Z853 Personal history of malignant neoplasm of breast: Secondary | ICD-10-CM

## 2016-07-17 DIAGNOSIS — D693 Immune thrombocytopenic purpura: Secondary | ICD-10-CM | POA: Diagnosis not present

## 2016-07-17 LAB — CBC WITH DIFFERENTIAL/PLATELET
BASO%: 0.2 % (ref 0.0–2.0)
BASOS ABS: 0.1 10*3/uL (ref 0.0–0.1)
EOS ABS: 0 10*3/uL (ref 0.0–0.5)
EOS%: 0.1 % (ref 0.0–7.0)
HEMATOCRIT: 45 % (ref 34.8–46.6)
HEMOGLOBIN: 14.9 g/dL (ref 11.6–15.9)
LYMPH#: 26.6 10*3/uL — AB (ref 0.9–3.3)
LYMPH%: 88.7 % — ABNORMAL HIGH (ref 14.0–49.7)
MCH: 30.8 pg (ref 25.1–34.0)
MCHC: 33.1 g/dL (ref 31.5–36.0)
MCV: 93.2 fL (ref 79.5–101.0)
MONO#: 0.6 10*3/uL (ref 0.1–0.9)
MONO%: 2.1 % (ref 0.0–14.0)
NEUT#: 2.7 10*3/uL (ref 1.5–6.5)
NEUT%: 8.9 % — AB (ref 38.4–76.8)
Platelets: 94 10*3/uL — ABNORMAL LOW (ref 145–400)
RBC: 4.83 10*6/uL (ref 3.70–5.45)
RDW: 14 % (ref 11.2–14.5)
WBC: 30 10*3/uL — ABNORMAL HIGH (ref 3.9–10.3)

## 2016-07-17 LAB — MORPHOLOGY
PLT EST: DECREASED
RBC Comments: NORMAL

## 2016-07-17 MED ORDER — PRAMOXINE HCL 1 % RE FOAM: RECTAL | 1 refills | Status: DC

## 2016-07-17 NOTE — Progress Notes (Signed)
OFFICE PROGRESS NOTE   July 17, 2016  K.Brigitte Pulse (PCP), M.Olin, Sanders (ophth), J.Hayes, M.Edwinna Areola Physicians:   INTERVAL HISTORY:   Patient is seen, alone for visit, in continuing attention to CLL which has not yet required treatment, also remote left breast cancer.  She will see PCP on 08-02-16  Platelets have been just under 100k since 10-2015, stable and not symptomatic, similar situation in 2013 improved with short course steroids then. Only bleeding has been one episode 4 days ago of blood on wipe after bowel movement. She has no noted change in adenopathy, no LUQ pain, no fever or symptoms of infection, no night sweats, energy at baseline, appetite good. She had tomo bilateral mammograms at [facility] 11-24-15, dense breast tissue butr otherwise nothing of concern on that imaging. She has not noticed any changes in breasts bilaterally.  Otherwise, no SOB with present activity, bowels move regularly, no LE swelling, chronic stiffness in back from arthritis but no pain. She has environmental allergies with ongoing post nasal drainage despite 1/2 lortadine daily; right ear also "stopped up" not painful. SHe uses witch hazel for hemorrhoids, cannot get into tub for sitz bath.  No central catheter Flu vaccine 05-2016  She enjoyed a trip to [facility] with family earlier this month.  ONCOLOGIC HISTORY CLL was diagnosed in 2004 and has not required intervention; the slightly progressive thrombocytopenia has not been symptomatic. Abdominal US in Oct 2012 had normal splenic volume. Platelets have been 90-100k back to 2011.  She had two short courses of prednisone at just 20 mg daily x 3 every other week in early 2013, tolerated without difficulty and some increase in platelets noted.   PMH is also significant for an early stage left breast cancer in 2004, treated with lumpectomy with sentinel node evaluation, local RT and 5 years of Femara thru 12-2007. She had bilateral 3D mammograms at  [facility] 11-24-15, still heterogeneously dense breast tissue but without mamographic findings of concern.   Objective:  Vital signs in last 24 hours:  BP (!) 166/97 (BP Location: Left Arm, Patient Position: Sitting) Comment: Made nurse aware of pt BP  Pulse 75   Temp 97.5 F (36.4 C) (Oral)   Resp 17   Ht 5' 0.25" (1.53 m)   Wt 148 lb 4.8 oz (67.3 kg)   SpO2 96%   BMI 28.72 kg/m  Elderly lady, alert, oriented and appropriate. Ambulatory without assistance. Looks comfortable  HEENT:PERRL, sclerae not icteric. Oral mucosa moist without lesions, posterior pharynx dull erythema consistent with post nasal drainage. TMs difficult to visualize bilaterally with ear canals tiny, but no erythema Neck supple. No JVD.  Lymphatics: small soft lower cervical nodes bilaterally, small soft right suraclavicular nodes, no right axillary/ ~ 3 cm soft left axillary nodes, no inguinal adenopathy Resp: clear to auscultation bilaterally and normal percussion bilaterally Cardio: regular rate and rhythm. No gallop. GI: soft, nontender, not distended, do not appreciate enlarged spleen or liver. Normally active bowel sounds. Perirectal area with tiny external hemorrhoidal tags, no tenderness, no blood on exam of distal rectum. She brought the wipe from single episode of bleeding 4 days ago, bright red blood ~ 3 cm diameter circle. Musculoskeletal/ Extremities: without pitting edema, cords, tenderness Neuro: speech fluent, moves all extremities equally, no focal deficits. PSYCH appropriate mood and affect. Skin cutaneous yeast under right breast otherwise without rash, ecchymosis, petechiae Breasts: Left lumpectomy scar well healed without evidence of local recurrence, otherwise bilaterally without dominant mass, skin or nipple findings. Axillae  benign.   Lab Results:  Results for orders placed or performed in visit on 07/17/16  CBC with Differential  Result Value Ref Range   WBC 30.0 (H) 3.9 - 10.3 10e3/uL    NEUT# 2.7 1.5 - 6.5 10e3/uL   HGB 14.9 11.6 - 15.9 g/dL   HCT 45.0 34.8 - 46.6 %   Platelets 94 (L) 145 - 400 10e3/uL   MCV 93.2 79.5 - 101.0 fL   MCH 30.8 25.1 - 34.0 pg   MCHC 33.1 31.5 - 36.0 g/dL   RBC 4.83 3.70 - 5.45 10e6/uL   RDW 14.0 11.2 - 14.5 %   lymph# 26.6 (H) 0.9 - 3.3 10e3/uL   MONO# 0.6 0.1 - 0.9 10e3/uL   Eosinophils Absolute 0.0 0.0 - 0.5 10e3/uL   Basophils Absolute 0.1 0.0 - 0.1 10e3/uL   NEUT% 8.9 (L) 38.4 - 76.8 %   LYMPH% 88.7 (H) 14.0 - 49.7 %   MONO% 2.1 0.0 - 14.0 %   EOS% 0.1 0.0 - 7.0 %   BASO% 0.2 0.0 - 2.0 %  Morphology  Result Value Ref Range   RBC Comments Within Normal Limits Within Normal Limits   Smudge Cells Mod Negative   White Cell Comments C/W auto diff    Other Comments Variant Lymphs    PLT EST Decreased Adequate     Studies/Results:  Due mammograms [facility] 11-2016, tomo medically appropriate with dense breast tissue.  Medications: I have reviewed the patient's current medications. She will increase lortadine to full 10 mg daily until she sees Dr Brigitte Pulse on 08-02-16. Proctofoam daily next few days then if hemorrhoids symptomatic  DISCUSSION CHC essentially stable, platelets essentially unchanged since 10-2015, and prior to that just 100k. Without other problems, prefer not to resume steroids in this 82 yo lady now, and nothing otherwise requiring CLL treatment.  She will follow up with oncology in ~ 12-2016, knows to contact this office sooner if needed.  Increase lortadine as above, mentioned Flonase or nasocort if not sufficient. She will let Dr Brigitte Pulse know how ear and other allergy symptoms are with this at visit there in 2 weeks. Mammograms as above. Small bright red blood per rectum after bowel movement most likely from internal hemorrhoid or tiny tear. Proctofoam as above, use soaking wet washcloths after bowel movements.    Assessment/Plan: 1.CLL in 82 yo lady: counts remain essentially stable, no complications, continuing observation.  Large platelets on peripheral smear consistent with mild ITP picture; platelet count responded well to short courses of steroids several years ago. Follow now.  2.history of early stage left breast cancer 2004, no known active disease. Mammograms [facility]  3D appropriate due to dense breast tissue, no mammographic findings of concern and exam unremarkable now  3.Slight BRB per rectum x 1 in past week, may be internal hemorrhoid, plan as above. Vaginal hemangioma causing previous vaginal spotting, no symptoms now. 4.right total hip replacement 12-2011  5.flu vaccine done 6.Son(s) with aortic aneurysm; one son died ~ 41 years ago. 7.post hysterectomy and left BSO apparently for fibroid 1972 . bladder tack procedures x 2 8.macular degeneration 9.flu vaccine 05-2016 10. Social stressors with husband's early dementia. Death of only grandchild 4 years ago. 11.right ear symptoms and environmental allergies: increase lortadine to 10 mg daily next 2 weeks, and will follow up with PCP at upcoming appointment.     All questions answered and patient is in agreement with recommendations and plans. She is aware that she will see a  different physician when she comes back to this office next year. Time spent 20 min including >50% counseling and coordination of care. Cc Drs Junie Bame; in EMR for Dr Kathe Becton, MD   07/17/2016, 1:12 PM

## 2016-07-19 ENCOUNTER — Other Ambulatory Visit: Payer: Self-pay | Admitting: Oncology

## 2016-07-19 DIAGNOSIS — Z9109 Other allergy status, other than to drugs and biological substances: Secondary | ICD-10-CM | POA: Insufficient documentation

## 2016-10-19 ENCOUNTER — Telehealth: Payer: Self-pay | Admitting: Hematology

## 2016-10-19 NOTE — Telephone Encounter (Signed)
retirement and to confirm next appointment on 01/15/17 at 10:00 am for labs and 10:30 pm with Dr. Irene Limbo

## 2016-10-25 DIAGNOSIS — H35423 Microcystoid degeneration of retina, bilateral: Secondary | ICD-10-CM | POA: Diagnosis not present

## 2016-10-25 DIAGNOSIS — H353213 Exudative age-related macular degeneration, right eye, with inactive scar: Secondary | ICD-10-CM | POA: Diagnosis not present

## 2016-10-25 DIAGNOSIS — H43813 Vitreous degeneration, bilateral: Secondary | ICD-10-CM | POA: Diagnosis not present

## 2016-10-25 DIAGNOSIS — H353221 Exudative age-related macular degeneration, left eye, with active choroidal neovascularization: Secondary | ICD-10-CM | POA: Diagnosis not present

## 2016-10-25 DIAGNOSIS — H31092 Other chorioretinal scars, left eye: Secondary | ICD-10-CM | POA: Diagnosis not present

## 2016-11-24 DIAGNOSIS — Z853 Personal history of malignant neoplasm of breast: Secondary | ICD-10-CM | POA: Diagnosis not present

## 2016-11-24 DIAGNOSIS — Z1231 Encounter for screening mammogram for malignant neoplasm of breast: Secondary | ICD-10-CM | POA: Diagnosis not present

## 2017-01-10 DIAGNOSIS — H353213 Exudative age-related macular degeneration, right eye, with inactive scar: Secondary | ICD-10-CM | POA: Diagnosis not present

## 2017-01-10 DIAGNOSIS — H353221 Exudative age-related macular degeneration, left eye, with active choroidal neovascularization: Secondary | ICD-10-CM | POA: Diagnosis not present

## 2017-01-10 DIAGNOSIS — H35423 Microcystoid degeneration of retina, bilateral: Secondary | ICD-10-CM | POA: Diagnosis not present

## 2017-01-10 DIAGNOSIS — H35372 Puckering of macula, left eye: Secondary | ICD-10-CM | POA: Diagnosis not present

## 2017-01-15 ENCOUNTER — Other Ambulatory Visit: Payer: Self-pay | Admitting: *Deleted

## 2017-01-15 ENCOUNTER — Ambulatory Visit (HOSPITAL_BASED_OUTPATIENT_CLINIC_OR_DEPARTMENT_OTHER): Payer: PPO | Admitting: Hematology

## 2017-01-15 ENCOUNTER — Encounter: Payer: Self-pay | Admitting: Hematology

## 2017-01-15 ENCOUNTER — Telehealth: Payer: Self-pay | Admitting: Hematology

## 2017-01-15 ENCOUNTER — Other Ambulatory Visit (HOSPITAL_BASED_OUTPATIENT_CLINIC_OR_DEPARTMENT_OTHER): Payer: PPO

## 2017-01-15 VITALS — BP 151/86 | HR 80 | Temp 97.5°F | Resp 18 | Wt 147.0 lb

## 2017-01-15 DIAGNOSIS — Z853 Personal history of malignant neoplasm of breast: Secondary | ICD-10-CM

## 2017-01-15 DIAGNOSIS — C9110 Chronic lymphocytic leukemia of B-cell type not having achieved remission: Secondary | ICD-10-CM

## 2017-01-15 DIAGNOSIS — Z170 Estrogen receptor positive status [ER+]: Secondary | ICD-10-CM

## 2017-01-15 DIAGNOSIS — D696 Thrombocytopenia, unspecified: Secondary | ICD-10-CM

## 2017-01-15 DIAGNOSIS — C50812 Malignant neoplasm of overlapping sites of left female breast: Secondary | ICD-10-CM

## 2017-01-15 LAB — TECHNOLOGIST REVIEW

## 2017-01-15 LAB — CBC & DIFF AND RETIC
BASO%: 0.2 % (ref 0.0–2.0)
BASOS ABS: 0.1 10*3/uL (ref 0.0–0.1)
EOS%: 0.3 % (ref 0.0–7.0)
Eosinophils Absolute: 0.1 10*3/uL (ref 0.0–0.5)
HEMATOCRIT: 43.9 % (ref 34.8–46.6)
HEMOGLOBIN: 14.5 g/dL (ref 11.6–15.9)
IMMATURE RETIC FRACT: 2.8 % (ref 1.60–10.00)
LYMPH#: 25.6 10*3/uL — AB (ref 0.9–3.3)
LYMPH%: 89 % — ABNORMAL HIGH (ref 14.0–49.7)
MCH: 30.9 pg (ref 25.1–34.0)
MCHC: 33 g/dL (ref 31.5–36.0)
MCV: 93.4 fL (ref 79.5–101.0)
MONO#: 0.7 10*3/uL (ref 0.1–0.9)
MONO%: 2.3 % (ref 0.0–14.0)
NEUT#: 2.4 10*3/uL (ref 1.5–6.5)
NEUT%: 8.2 % — AB (ref 38.4–76.8)
PLATELETS: 98 10*3/uL — AB (ref 145–400)
RBC: 4.7 10*6/uL (ref 3.70–5.45)
RDW: 14.3 % (ref 11.2–14.5)
RETIC CT ABS: 48.41 10*3/uL (ref 33.70–90.70)
Retic %: 1.03 % (ref 0.70–2.10)
WBC: 28.7 10*3/uL — ABNORMAL HIGH (ref 3.9–10.3)

## 2017-01-15 LAB — COMPREHENSIVE METABOLIC PANEL
ALT: 17 U/L (ref 0–55)
ANION GAP: 12 meq/L — AB (ref 3–11)
AST: 24 U/L (ref 5–34)
Albumin: 4 g/dL (ref 3.5–5.0)
Alkaline Phosphatase: 99 U/L (ref 40–150)
BILIRUBIN TOTAL: 0.74 mg/dL (ref 0.20–1.20)
BUN: 13.9 mg/dL (ref 7.0–26.0)
CALCIUM: 9.8 mg/dL (ref 8.4–10.4)
CO2: 27 mEq/L (ref 22–29)
CREATININE: 0.7 mg/dL (ref 0.6–1.1)
Chloride: 106 mEq/L (ref 98–109)
EGFR: 79 mL/min/{1.73_m2} — ABNORMAL LOW (ref >90)
Glucose: 83 mg/dl (ref 70–140)
Potassium: 3.9 mEq/L (ref 3.5–5.1)
Sodium: 145 mEq/L (ref 136–145)
TOTAL PROTEIN: 6.9 g/dL (ref 6.4–8.3)

## 2017-01-15 NOTE — Telephone Encounter (Signed)
Gave patient AVS and calender per 4/30 los.  

## 2017-01-16 NOTE — Progress Notes (Signed)
Abigail Moreno  HEMATOLOGY ONCOLOGY PROGRESS NOTE  Date of service: .01/15/2017  Patient Care Team: Abigail Neer, MD as PCP - General (Family Medicine)  CC: f/u for CLL (transfer of care from Dr Marko Plume)  Diagnosis: CLL with mild thrombocytopenia ? Cytogenetics.  Current Treatment: observation  ONCOLOGIC HISTORY CLL was diagnosed in 2004 and has not required intervention; the slightly progressive thrombocytopenia has not been symptomatic. Abdominal US in Oct 2012 had normal splenic volume. Platelets have been 90-100k back to 2011. She had two short courses of prednisone at just 20 mg daily x 3 every other week in early 2013, tolerated without difficulty and some increase in platelets noted.   PMH is also significant for an early stage left breast cancer in 2004, treated with lumpectomy with sentinel node evaluation, local RT and 5 years of Femara thru 12-2007. She had bilateral 3D mammograms at [facility] 11-24-15, still heterogeneously dense breast tissue but without mamographic findings of concern.   INTERVAL HISTORY:  [name] is here for her scheduled 6 month follow-up for CLL. She is transferring care from Dr. Marko Plume who retired late last year. She notes no acute new concerns. No fevers no chills no night sweats no weight loss. No issues with frequent infections, bleeding or bruising or new fatigue. Labs today are stable.  Patient also has a remote history of early stage left breast cancer diagnosed in 2004 and treated with lumpectomy and sentinel lymph node biopsy along with adjuvant radiation therapy and 5 years of endocrine therapy. She notes no new lumps or bumps. She had a recent mammogram in March 2018 at [facility] which she was informed was bleeding normal except the fact that she has dense breasts.   REVIEW OF SYSTEMS:    10 Point review of systems of done and is negative except as noted above.  . Past Medical History:  Diagnosis Date  . Anxiety   . Arthritis of knee  FALL 2012     RIGHT KNEE, hips, and fingers  . Breast cancer ([place]) FEBRUARY 2004   T1, NO, ER/PR POSITIVE LEFT BREAST  . CLL (chronic lymphocytic leukemia) ([place]) 2004   Dr. Marko Plume is oncologist  . History of blood transfusion    with first and maybe second delivery  . Hyperlipemia   . Hypertension   . Macular degeneration   . Osteoporosis    borderline    . Past Surgical History:  Procedure Laterality Date  . ABDOMINAL HYSTERECTOMY  1972   UNILATERAL OOPHORECTOMY( LEFT)  . APPENDECTOMY    . EYE SURGERY     left eye surgery   . INCONTINENCE SURGERY    . MASTECTOMY PARTIAL / LUMPECTOMY W/ AXILLARY LYMPHADENECTOMY  FEBRUARY 2004   LEFT BREAST  . OTHER SURGICAL HISTORY     cysts removed from breasts   . TOTAL HIP ARTHROPLASTY  12/26/2011   Procedure: TOTAL HIP ARTHROPLASTY ANTERIOR APPROACH;  Surgeon: Mauri Pole, MD;  Location: [facility];  Service: Orthopedics;  Laterality: Right;    . Social History  Substance Use Topics  . Smoking status: Former Smoker    Quit date: 09/18/1958  . Smokeless tobacco: Never Used  . Alcohol use No    ALLERGIES:  is allergic to colesevelam hcl; ezetimibe; procaine hcl; rosuvastatin; simvastatin; statins; and sulfa antibiotics.  MEDICATIONS:  Current Outpatient Prescriptions  Medication Sig Dispense Refill  . ALPRAZolam (XANAX) 0.25 MG tablet TK 1/2 TO 1 T PO Q 6 HOURS PRN  0  . amLODipine-benazepril (LOTREL) 5-20 MG  per capsule Take 1 capsule by mouth daily. Takes BP prior to using medication per PCP    . amoxicillin (AMOXIL) 500 MG capsule Reported on 10/15/2015  0  . Calcium Carbonate-Vitamin D (CALCIUM 600 + D PO) Take 1 tablet by mouth daily.     . Cinnamon 500 MG capsule Take 500 mg by mouth 2 (two) times daily.    Abigail Moreno CRANBERRY CONCENTRATE PO Take 1 tablet by mouth daily.    . fluticasone (FLONASE) 50 MCG/ACT nasal spray Place 1 spray into both nostrils daily.  1  . loratadine (CLARITIN) 10 MG tablet Take 5 mg by mouth daily.    . MULTIPLE VITAMIN  PO Take 1 tablet by mouth daily.    . Multiple Vitamins-Minerals (ICAPS) CAPS Take 2 capsules by mouth 2 (two) times daily.    Abigail Moreno nystatin (MYCOSTATIN/NYSTOP) 100000 UNIT/GM POWD Sprinkle lightly on affected area as directed by MD 60 g 0  . OMEGA 3 1200 MG CAPS Take 1 capsule by mouth 3 (three) times daily.    . pramoxine (PROCTOFOAM) 1 % foam Use as directed for hemorrhoids 15 g 1  . tobramycin (TOBREX) 0.3 % ophthalmic solution Place 1 drop into both eyes 4 (four) times daily. When going to see eye doctor  5   No current facility-administered medications for this visit.     PHYSICAL EXAMINATION: ECOG PERFORMANCE STATUS: 1 - Symptomatic but completely ambulatory  . Vitals:   01/15/17 1039  BP: (!) 151/86  Pulse: 80  Resp: 18  Temp: 97.5 F (36.4 C)    Filed Weights   01/15/17 1039  Weight: 147 lb (66.7 kg)   .Body mass index is 28.47 kg/m.  GENERAL:alert, in no acute distress and comfortable SKIN: no acute rashes, no significant lesions EYES: conjunctiva are pink and non-injected, sclera anicteric OROPHARYNX: MMM, no exudates, no oropharyngeal erythema or ulceration NECK: supple, no JVD LYMPH:  no palpable lymphadenopathy in the cervical, axillary or inguinal regions LUNGS: clear to auscultation b/l with normal respiratory effort HEART: regular rate & rhythm ABDOMEN:  normoactive bowel sounds , non tender, not distended. No overt palpable hepatosplenomegaly Extremity: no pedal edema PSYCH: alert & oriented x 3 with fluent speech NEURO: no focal motor/sensory deficits  LABORATORY DATA:   I have reviewed the data as listed  . CBC Latest Ref Rng & Units 01/15/2017 07/17/2016 03/16/2016  WBC 3.9 - 10.3 10e3/uL 28.7(H) 30.0(H) 28.8(H)  Hemoglobin 11.6 - 15.9 g/dL 14.5 14.9 15.0  Hematocrit 34.8 - 46.6 % 43.9 45.0 45.2  Platelets 145 - 400 10e3/uL 98(L) 94(L) 96(L)    . CMP Latest Ref Rng & Units 01/15/2017 10/21/2015 06/14/2015  Glucose 70 - 140 mg/dl 83 85 88  BUN 7.0  - 26.0 mg/dL 13.9 15.6 13.5  Creatinine 0.6 - 1.1 mg/dL 0.7 0.8 0.7  Sodium 136 - 145 mEq/L 145 142 143  Potassium 3.5 - 5.1 mEq/L 3.9 4.3 4.0  Chloride 98 - 107 mEq/L - - -  CO2 22 - 29 mEq/L 27 28 27   Calcium 8.4 - 10.4 mg/dL 9.8 9.5 9.4  Total Protein 6.4 - 8.3 g/dL 6.9 6.9 6.9  Total Bilirubin 0.20 - 1.20 mg/dL 0.74 0.69 0.76  Alkaline Phos 40 - 150 U/L 99 93 83  AST 5 - 34 U/L 24 19 20   ALT 0 - 55 U/L 17 13 13     RADIOGRAPHIC STUDIES: I have personally reviewed the radiological images as listed and agreed with the findings in the report. No  results found.  ASSESSMENT & PLAN:   83 year old very pleasant Caucasian female with  #1 CLL . Unknown cytogenetics.  Patient has had some chronic thrombocytopenia that is stable in the high 90k range and remains unchanged. Her cytopenia has been responsive to steroids in the past which might suggest an immune competent to it. Plan -Patient has no new constitutional symptoms and her lab shows stable WBC counts no anemia and stable thrombocytopenia . -No indication for treatment of the CLL at this time  -Continue to follow her in 6 months with repeat labs . -We should also send out a CLL fish prognostic panel on follow-up .  #2 history of early stage left breast cancer 2004 status post treatment as noted above. Currently has no new concerns of breast lumps or regional lymphadenopathy.   Mammograms [facility] 3D - as per patient's report was done in March 2018 and showed no new concerns. Known to have dense breasts. PLan -We will try to get her outside mammogram results for our records . -No indication for additional treatment for her breast cancer at this time . -Continue breast self-examination and physician examination every 6 months with primary care physician .  I spent 20 minutes counseling the patient face to face. The total time spent in the appointment was 25 minutes and more than 50% was on counseling and direct patient cares.      Sullivan Lone MD [place] AAHIVMS [facility] Hematology/Oncology Physician [facility]  (Office):       [phone] (Work cell):  [phone] (Fax):           [phone]

## 2017-02-28 ENCOUNTER — Other Ambulatory Visit: Payer: Self-pay | Admitting: Family Medicine

## 2017-02-28 ENCOUNTER — Ambulatory Visit
Admission: RE | Admit: 2017-02-28 | Discharge: 2017-02-28 | Disposition: A | Discharge status: Home / Self Care | Payer: PPO | Source: Ambulatory Visit | Attending: Family Medicine | Admitting: Family Medicine

## 2017-02-28 DIAGNOSIS — M1990 Unspecified osteoarthritis, unspecified site: Secondary | ICD-10-CM | POA: Diagnosis not present

## 2017-02-28 DIAGNOSIS — M8580 Other specified disorders of bone density and structure, unspecified site: Secondary | ICD-10-CM | POA: Diagnosis not present

## 2017-02-28 DIAGNOSIS — I10 Essential (primary) hypertension: Secondary | ICD-10-CM | POA: Diagnosis not present

## 2017-02-28 DIAGNOSIS — M549 Dorsalgia, unspecified: Secondary | ICD-10-CM

## 2017-02-28 DIAGNOSIS — M25519 Pain in unspecified shoulder: Secondary | ICD-10-CM | POA: Diagnosis not present

## 2017-02-28 DIAGNOSIS — E782 Mixed hyperlipidemia: Secondary | ICD-10-CM | POA: Diagnosis not present

## 2017-02-28 DIAGNOSIS — C9110 Chronic lymphocytic leukemia of B-cell type not having achieved remission: Secondary | ICD-10-CM | POA: Diagnosis not present

## 2017-02-28 DIAGNOSIS — Z0000 Encounter for general adult medical examination without abnormal findings: Secondary | ICD-10-CM | POA: Diagnosis not present

## 2017-02-28 DIAGNOSIS — H3530 Unspecified macular degeneration: Secondary | ICD-10-CM | POA: Diagnosis not present

## 2017-02-28 DIAGNOSIS — J309 Allergic rhinitis, unspecified: Secondary | ICD-10-CM | POA: Diagnosis not present

## 2017-02-28 DIAGNOSIS — Z853 Personal history of malignant neoplasm of breast: Secondary | ICD-10-CM | POA: Diagnosis not present

## 2017-02-28 DIAGNOSIS — D696 Thrombocytopenia, unspecified: Secondary | ICD-10-CM | POA: Diagnosis not present

## 2017-03-14 DIAGNOSIS — H35423 Microcystoid degeneration of retina, bilateral: Secondary | ICD-10-CM | POA: Diagnosis not present

## 2017-03-14 DIAGNOSIS — H353213 Exudative age-related macular degeneration, right eye, with inactive scar: Secondary | ICD-10-CM | POA: Diagnosis not present

## 2017-03-14 DIAGNOSIS — H353221 Exudative age-related macular degeneration, left eye, with active choroidal neovascularization: Secondary | ICD-10-CM | POA: Diagnosis not present

## 2017-03-14 DIAGNOSIS — H31092 Other chorioretinal scars, left eye: Secondary | ICD-10-CM | POA: Diagnosis not present

## 2017-04-17 DIAGNOSIS — S61431A Puncture wound without foreign body of right hand, initial encounter: Secondary | ICD-10-CM | POA: Diagnosis not present

## 2017-04-25 DIAGNOSIS — M8588 Other specified disorders of bone density and structure, other site: Secondary | ICD-10-CM | POA: Diagnosis not present

## 2017-05-16 DIAGNOSIS — H35423 Microcystoid degeneration of retina, bilateral: Secondary | ICD-10-CM | POA: Diagnosis not present

## 2017-05-16 DIAGNOSIS — H353213 Exudative age-related macular degeneration, right eye, with inactive scar: Secondary | ICD-10-CM | POA: Diagnosis not present

## 2017-05-16 DIAGNOSIS — H35372 Puckering of macula, left eye: Secondary | ICD-10-CM | POA: Diagnosis not present

## 2017-05-16 DIAGNOSIS — H353221 Exudative age-related macular degeneration, left eye, with active choroidal neovascularization: Secondary | ICD-10-CM | POA: Diagnosis not present

## 2017-06-11 DIAGNOSIS — Z23 Encounter for immunization: Secondary | ICD-10-CM | POA: Diagnosis not present

## 2017-07-16 ENCOUNTER — Encounter: Payer: Self-pay | Admitting: Hematology

## 2017-07-16 ENCOUNTER — Ambulatory Visit (HOSPITAL_BASED_OUTPATIENT_CLINIC_OR_DEPARTMENT_OTHER): Payer: PPO | Admitting: Hematology

## 2017-07-16 ENCOUNTER — Telehealth: Payer: Self-pay | Admitting: Hematology

## 2017-07-16 ENCOUNTER — Other Ambulatory Visit (HOSPITAL_BASED_OUTPATIENT_CLINIC_OR_DEPARTMENT_OTHER): Payer: PPO

## 2017-07-16 VITALS — BP 158/81 | HR 85 | Temp 98.5°F | Resp 17 | Ht 60.25 in | Wt 147.5 lb

## 2017-07-16 DIAGNOSIS — C9110 Chronic lymphocytic leukemia of B-cell type not having achieved remission: Secondary | ICD-10-CM

## 2017-07-16 DIAGNOSIS — Z853 Personal history of malignant neoplasm of breast: Secondary | ICD-10-CM

## 2017-07-16 DIAGNOSIS — C9190 Lymphoid leukemia, unspecified not having achieved remission: Secondary | ICD-10-CM | POA: Diagnosis not present

## 2017-07-16 LAB — CBC & DIFF AND RETIC
BASO%: 0.2 % (ref 0.0–2.0)
Basophils Absolute: 0.1 10*3/uL (ref 0.0–0.1)
EOS ABS: 0.1 10*3/uL (ref 0.0–0.5)
EOS%: 0.2 % (ref 0.0–7.0)
HEMATOCRIT: 46.4 % (ref 34.8–46.6)
HEMOGLOBIN: 15.3 g/dL (ref 11.6–15.9)
Immature Retic Fract: 2.9 % (ref 1.60–10.00)
LYMPH%: 89.7 % — AB (ref 14.0–49.7)
MCH: 31.1 pg (ref 25.1–34.0)
MCHC: 33 g/dL (ref 31.5–36.0)
MCV: 94.3 fL (ref 79.5–101.0)
MONO#: 0.8 10*3/uL (ref 0.1–0.9)
MONO%: 2.2 % (ref 0.0–14.0)
NEUT%: 7.7 % — AB (ref 38.4–76.8)
NEUTROS ABS: 2.7 10*3/uL (ref 1.5–6.5)
PLATELETS: 90 10*3/uL — AB (ref 145–400)
RBC: 4.92 10*6/uL (ref 3.70–5.45)
RDW: 14.3 % (ref 11.2–14.5)
Retic %: 1.26 % (ref 0.70–2.10)
Retic Ct Abs: 61.99 10*3/uL (ref 33.70–90.70)
WBC: 34.9 10*3/uL — AB (ref 3.9–10.3)
lymph#: 31.3 10*3/uL — ABNORMAL HIGH (ref 0.9–3.3)

## 2017-07-16 LAB — COMPREHENSIVE METABOLIC PANEL
ALBUMIN: 3.9 g/dL (ref 3.5–5.0)
ALT: 15 U/L (ref 0–55)
ANION GAP: 10 meq/L (ref 3–11)
AST: 22 U/L (ref 5–34)
Alkaline Phosphatase: 104 U/L (ref 40–150)
BILIRUBIN TOTAL: 0.59 mg/dL (ref 0.20–1.20)
BUN: 12.9 mg/dL (ref 7.0–26.0)
CO2: 26 meq/L (ref 22–29)
Calcium: 9.4 mg/dL (ref 8.4–10.4)
Chloride: 107 mEq/L (ref 98–109)
Creatinine: 0.7 mg/dL (ref 0.6–1.1)
EGFR: >60 mL/min/{1.73_m2} (ref >60)
Glucose: 77 mg/dl (ref 70–140)
Potassium: 3.9 mEq/L (ref 3.5–5.1)
SODIUM: 143 meq/L (ref 136–145)
Total Protein: 7.1 g/dL (ref 6.4–8.3)

## 2017-07-16 LAB — TECHNOLOGIST REVIEW

## 2017-07-16 NOTE — Patient Instructions (Signed)
Thank you for choosing [facility] to provide your oncology and hematology care.  To afford each patient quality time with our providers, please arrive 30 minutes before your scheduled appointment time.  If you arrive late for your appointment, you may be asked to reschedule.  We strive to give you quality time with our providers, and arriving late affects you and other patients whose appointments are after yours.   If you are a no show for multiple scheduled visits, you may be dismissed from the clinic at the providers discretion.    Again, thank you for choosing [facility], our hope is that these requests will decrease the amount of time that you wait before being seen by our physicians.  ______________________________________________________________________  Should you have questions after your visit to the [facility], please contact our office at [phone] between the hours of 8:30 and 4:30 p.m.    Voicemails left after 4:30p.m will not be returned until the following business day.    For prescription refill requests, please have your pharmacy contact us directly.  Please also try to allow 48 hours for prescription requests.    Please contact the scheduling department for questions regarding scheduling.  For scheduling of procedures such as PET scans, CT scans, MRI, Ultrasound, etc please contact central scheduling at [phone].    Resources For Cancer Patients and Caregivers:   Oncolink.org:  A wonderful resource for patients and healthcare providers for information regarding your disease, ways to tract your treatment, what to expect, etc.     American Cancer Society:  800-227-2345  Can help patients locate various types of support and financial assistance  Cancer Care: 1-800-813-HOPE (4673) Provides financial assistance, online support groups, medication/co-pay assistance.    [place] DSS:  336-641-3447 Where to apply for food  stamps, Medicaid, and utility assistance  Medicare Rights Center: 800-333-4114 Helps people with Medicare understand their rights and benefits, navigate the Medicare system, and secure the quality healthcare they deserve  SCAT: 336-333-6589 [place] [employer]'s shared-ride transportation service for eligible riders who have a disability that prevents them from riding the fixed route bus.    For additional information on assistance programs please contact our social worker:   Grier Hock/Abigail Elmore:  [phone]            

## 2017-07-16 NOTE — Progress Notes (Signed)
Marland Kitchen  HEMATOLOGY ONCOLOGY PROGRESS NOTE  Date of service: 07/16/17   Patient Care Team: Mayra Neer, MD as PCP - General (Family Medicine)  CC: f/u for CLL (transfer of care from Dr Marko Plume)  Diagnosis: CLL with mild thrombocytopenia ? Cytogenetics.  Current Treatment: observation  ONCOLOGIC HISTORY CLL was diagnosed in 2004 and has not required intervention; the slightly progressive thrombocytopenia has not been symptomatic. Abdominal US in Oct 2012 had normal splenic volume. Platelets have been 90-100k back to 2011. She had two short courses of prednisone at just 20 mg daily x 3 every other week in early 2013, tolerated without difficulty and some increase in platelets noted.   PMH is also significant for an early stage left breast cancer in 2004, treated with lumpectomy with sentinel node evaluation, local RT and 5 years of Femara thru 12-2007. She had bilateral 3D mammograms at [facility] 11-24-15, still heterogeneously dense breast tissue but without mamographic findings of concern.   INTERVAL HISTORY:  Abigail Moreno is here for her scheduled 6 month follow-up for CLL. She states that she is doing well overall. No new acute concerns. She is cooking and still maintaining her daily activities around the house. No indication for treatment needed at this time.   On review of systems, pt denies weight loss, fever, chills, night sweats, changes in BM, abdominal pain and any other accompanying symptoms.   Patient also has a remote history of early stage left breast cancer diagnosed in 2004 and treated with lumpectomy and sentinel lymph node biopsy along with adjuvant radiation therapy and 5 years of endocrine therapy. She notes no new lumps or bumps. She is due for her next mammogram in March 2019  With her PCP.  REVIEW OF SYSTEMS:    10 Point review of systems of done and is negative except as noted above.  . Past Medical History:  Diagnosis Date   Anxiety    Arthritis of knee   FALL 2012   RIGHT KNEE, hips, and fingers   Breast cancer ([place]) FEBRUARY 2004   T1, NO, ER/PR POSITIVE LEFT BREAST   CLL (chronic lymphocytic leukemia) ([place]) 2004   Dr. Marko Plume is oncologist   History of blood transfusion    with first and maybe second delivery   Hyperlipemia    Hypertension    Macular degeneration    Osteoporosis    borderline    . Past Surgical History:  Procedure Laterality Date   ABDOMINAL HYSTERECTOMY  1972   UNILATERAL OOPHORECTOMY( LEFT)   APPENDECTOMY     EYE SURGERY     left eye surgery    INCONTINENCE SURGERY     MASTECTOMY PARTIAL / LUMPECTOMY W/ AXILLARY LYMPHADENECTOMY  FEBRUARY 2004   LEFT BREAST   OTHER SURGICAL HISTORY     cysts removed from breasts    TOTAL HIP ARTHROPLASTY  12/26/2011   Procedure: TOTAL HIP ARTHROPLASTY ANTERIOR APPROACH;  Surgeon: Mauri Pole, MD;  Location: [facility];  Service: Orthopedics;  Laterality: Right;    . Social History  Substance Use Topics   Smoking status: Former Smoker    Quit date: 09/18/1958   Smokeless tobacco: Never Used   Alcohol use No    ALLERGIES:  is allergic to colesevelam hcl; ezetimibe; procaine hcl; rosuvastatin; simvastatin; statins; and sulfa antibiotics.  MEDICATIONS:  Current Outpatient Prescriptions  Medication Sig Dispense Refill   ALPRAZolam (XANAX) 0.25 MG tablet TK 1/2 TO 1 T PO Q 6 HOURS PRN  0   amLODipine-benazepril (LOTREL)  5-20 MG per capsule Take 1 capsule by mouth daily. Takes BP prior to using medication per PCP     amoxicillin (AMOXIL) 500 MG capsule Reported on 10/15/2015  0   Calcium Carbonate-Vitamin D (CALCIUM 600 + D PO) Take 1 tablet by mouth daily.      Cinnamon 500 MG capsule Take 500 mg by mouth 2 (two) times daily.     CRANBERRY CONCENTRATE PO Take 1 tablet by mouth daily.     fluticasone (FLONASE) 50 MCG/ACT nasal spray Place 1 spray into both nostrils daily.  1   loratadine (CLARITIN) 10 MG tablet Take 5 mg by mouth daily.      MULTIPLE VITAMIN PO Take 1 tablet by mouth daily.     Multiple Vitamins-Minerals (ICAPS) CAPS Take 2 capsules by mouth 2 (two) times daily.     nystatin (MYCOSTATIN/NYSTOP) 100000 UNIT/GM POWD Sprinkle lightly on affected area as directed by MD 60 g 0   OMEGA 3 1200 MG CAPS Take 1 capsule by mouth 3 (three) times daily.     pramoxine (PROCTOFOAM) 1 % foam Use as directed for hemorrhoids 15 g 1   tobramycin (TOBREX) 0.3 % ophthalmic solution Place 1 drop into both eyes 4 (four) times daily. When going to see eye doctor  5   No current facility-administered medications for this visit.     PHYSICAL EXAMINATION: ECOG PERFORMANCE STATUS: 1 - Symptomatic but completely ambulatory  . Vitals:   07/16/17 1018  BP: (!) 158/81  Pulse: 85  Resp: 17  Temp: 98.5 F (36.9 C)  SpO2: 94%    Filed Weights   07/16/17 1018  Weight: 147 lb 8 oz (66.9 kg)   .Body mass index is 28.57 kg/m.  GENERAL:alert, in no acute distress and comfortable SKIN: no acute rashes, no significant lesions EYES: conjunctiva are pink and non-injected, sclera anicteric OROPHARYNX: MMM, no exudates, no oropharyngeal erythema or ulceration NECK: supple, no JVD LYMPH:  no palpable lymphadenopathy in the cervical, axillary or inguinal regions LUNGS: clear to auscultation b/l with normal respiratory effort HEART: regular rate & rhythm ABDOMEN:  normoactive bowel sounds , non tender, not distended. No overt palpable hepatosplenomegaly Extremity: no pedal edema PSYCH: alert & oriented x 3 with fluent speech NEURO: no focal motor/sensory deficits  LABORATORY DATA:   I have reviewed the data as listed  . CBC Latest Ref Rng & Units 07/16/2017 01/15/2017 07/17/2016  WBC 3.9 - 10.3 10e3/uL 34.9(H) 28.7(H) 30.0(H)  Hemoglobin 11.6 - 15.9 g/dL 15.3 14.5 14.9  Hematocrit 34.8 - 46.6 % 46.4 43.9 45.0  Platelets 145 - 400 10e3/uL 90(L) 98(L) 94(L)    . CMP Latest Ref Rng & Units 01/15/2017 10/21/2015 06/14/2015  Glucose  70 - 140 mg/dl 83 85 88  BUN 7.0 - 26.0 mg/dL 13.9 15.6 13.5  Creatinine 0.6 - 1.1 mg/dL 0.7 0.8 0.7  Sodium 136 - 145 mEq/L 145 142 143  Potassium 3.5 - 5.1 mEq/L 3.9 4.3 4.0  Chloride 98 - 107 mEq/L - - -  CO2 22 - 29 mEq/L 27 28 27   Calcium 8.4 - 10.4 mg/dL 9.8 9.5 9.4  Total Protein 6.4 - 8.3 g/dL 6.9 6.9 6.9  Total Bilirubin 0.20 - 1.20 mg/dL 0.74 0.69 0.76  Alkaline Phos 40 - 150 U/L 99 93 83  AST 5 - 34 U/L 24 19 20   ALT 0 - 55 U/L 17 13 13     RADIOGRAPHIC STUDIES: I have personally reviewed the radiological images as listed and agreed  with the findings in the report. No results found.  ASSESSMENT & PLAN:   83 year old very pleasant Caucasian female with  #1 CLL . Unknown cytogenetics.  Patient has had some chronic thrombocytopenia that is stable in the 90k range and remains unchanged. Her cytopenia has been responsive to steroids in the past which might suggest an immune component to it. WBC?lymphocyte counts slightly increased. Plan -Patient has no new constitutional symptoms and her lab shows stable WBC counts no anemia and stable thrombocytopenia . -No indication for treatment of the CLL at this time  -Continue to follow her in 6 months with repeat labs . -We has sent out a CLL fish prognostic panel to determine her cytogenetics  #2 history of early stage left breast cancer 2004 status post treatment as noted above. Currently has no new concerns of breast lumps or regional lymphadenopathy.   Mammograms [facility] 3D - as per patient's report was done in March 2018 and showed no new concerns. Known to have dense breasts. PLan --No indication for additional treatment for her breast cancer at this time . -Continue breast self-examination and physician examination every 6 months with primary care physician . -next MMG due with PCP in march 2019  -Does not meet critera for treatment of CLL at this time RTC in 6 months -- patient prefers female physician and saw Dr Marko Plume  previously -- will let schedulers know.  I spent 20 minutes counseling the patient face to face. The total time spent in the appointment was 25 minutes and more than 50% was on counseling and direct patient cares.    Sullivan Lone MD [place] AAHIVMS [facility] Hematology/Oncology Physician [facility]  (Office):       [phone] (Work cell):  [phone] (Fax):           [phone]  This document serves as a record of services personally performed by Sullivan Lone, MD. It was created on her behalf by Alean Rinne, a trained medical scribe. The creation of this record is based on the scribe's personal observations and the provider's statements to them. This document has been checked and approved by the attending provider.

## 2017-07-16 NOTE — Telephone Encounter (Signed)
Scheduled appt per 10/29 los - Gave patient AVS and calender per los.  

## 2017-07-25 DIAGNOSIS — H353221 Exudative age-related macular degeneration, left eye, with active choroidal neovascularization: Secondary | ICD-10-CM | POA: Diagnosis not present

## 2017-07-25 DIAGNOSIS — H43813 Vitreous degeneration, bilateral: Secondary | ICD-10-CM | POA: Diagnosis not present

## 2017-07-25 DIAGNOSIS — H353213 Exudative age-related macular degeneration, right eye, with inactive scar: Secondary | ICD-10-CM | POA: Diagnosis not present

## 2017-07-25 DIAGNOSIS — H35423 Microcystoid degeneration of retina, bilateral: Secondary | ICD-10-CM | POA: Diagnosis not present

## 2017-07-26 LAB — FISH,CLL PROGNOSTIC PANEL

## 2017-09-17 ENCOUNTER — Ambulatory Visit
Admission: RE | Admit: 2017-09-17 | Discharge: 2017-09-17 | Disposition: A | Discharge status: Home / Self Care | Payer: PPO | Source: Ambulatory Visit | Attending: Family Medicine | Admitting: Family Medicine

## 2017-09-17 ENCOUNTER — Other Ambulatory Visit: Payer: Self-pay | Admitting: Family Medicine

## 2017-09-17 DIAGNOSIS — M542 Cervicalgia: Secondary | ICD-10-CM

## 2017-10-03 DIAGNOSIS — H353213 Exudative age-related macular degeneration, right eye, with inactive scar: Secondary | ICD-10-CM | POA: Diagnosis not present

## 2017-10-03 DIAGNOSIS — H35372 Puckering of macula, left eye: Secondary | ICD-10-CM | POA: Diagnosis not present

## 2017-10-03 DIAGNOSIS — H353221 Exudative age-related macular degeneration, left eye, with active choroidal neovascularization: Secondary | ICD-10-CM | POA: Diagnosis not present

## 2017-10-03 DIAGNOSIS — H35423 Microcystoid degeneration of retina, bilateral: Secondary | ICD-10-CM | POA: Diagnosis not present

## 2017-10-16 DIAGNOSIS — C9110 Chronic lymphocytic leukemia of B-cell type not having achieved remission: Secondary | ICD-10-CM | POA: Diagnosis not present

## 2017-10-16 DIAGNOSIS — I10 Essential (primary) hypertension: Secondary | ICD-10-CM | POA: Diagnosis not present

## 2017-10-16 DIAGNOSIS — E782 Mixed hyperlipidemia: Secondary | ICD-10-CM | POA: Diagnosis not present

## 2017-11-07 DIAGNOSIS — M25552 Pain in left hip: Secondary | ICD-10-CM | POA: Diagnosis not present

## 2017-11-07 DIAGNOSIS — M545 Low back pain: Secondary | ICD-10-CM | POA: Diagnosis not present

## 2017-11-30 ENCOUNTER — Encounter: Payer: Self-pay | Admitting: Adult Health

## 2017-11-30 DIAGNOSIS — Z853 Personal history of malignant neoplasm of breast: Secondary | ICD-10-CM | POA: Diagnosis not present

## 2017-11-30 DIAGNOSIS — Z1231 Encounter for screening mammogram for malignant neoplasm of breast: Secondary | ICD-10-CM | POA: Diagnosis not present

## 2017-12-12 DIAGNOSIS — H35423 Microcystoid degeneration of retina, bilateral: Secondary | ICD-10-CM | POA: Diagnosis not present

## 2017-12-12 DIAGNOSIS — H353213 Exudative age-related macular degeneration, right eye, with inactive scar: Secondary | ICD-10-CM | POA: Diagnosis not present

## 2017-12-12 DIAGNOSIS — H35372 Puckering of macula, left eye: Secondary | ICD-10-CM | POA: Diagnosis not present

## 2017-12-12 DIAGNOSIS — H353221 Exudative age-related macular degeneration, left eye, with active choroidal neovascularization: Secondary | ICD-10-CM | POA: Diagnosis not present

## 2018-01-14 ENCOUNTER — Ambulatory Visit: Payer: PPO | Admitting: Hematology

## 2018-01-14 ENCOUNTER — Other Ambulatory Visit: Payer: PPO

## 2018-02-15 DIAGNOSIS — H35372 Puckering of macula, left eye: Secondary | ICD-10-CM | POA: Diagnosis not present

## 2018-02-15 DIAGNOSIS — H353221 Exudative age-related macular degeneration, left eye, with active choroidal neovascularization: Secondary | ICD-10-CM | POA: Diagnosis not present

## 2018-02-15 DIAGNOSIS — H353213 Exudative age-related macular degeneration, right eye, with inactive scar: Secondary | ICD-10-CM | POA: Diagnosis not present

## 2018-02-15 DIAGNOSIS — H35423 Microcystoid degeneration of retina, bilateral: Secondary | ICD-10-CM | POA: Diagnosis not present

## 2018-03-22 ENCOUNTER — Ambulatory Visit: Payer: PPO | Admitting: Hematology

## 2018-03-22 ENCOUNTER — Other Ambulatory Visit: Payer: PPO

## 2018-03-25 ENCOUNTER — Other Ambulatory Visit: Payer: PPO

## 2018-03-25 ENCOUNTER — Inpatient Hospital Stay: Payer: PPO | Attending: Hematology | Admitting: Hematology

## 2018-03-25 ENCOUNTER — Ambulatory Visit: Payer: PPO | Admitting: Hematology

## 2018-03-26 ENCOUNTER — Telehealth: Payer: Self-pay | Admitting: Hematology

## 2018-03-26 NOTE — Telephone Encounter (Signed)
Returned patients call to r/s august appts.

## 2018-03-29 DIAGNOSIS — Z0000 Encounter for general adult medical examination without abnormal findings: Secondary | ICD-10-CM | POA: Diagnosis not present

## 2018-03-29 DIAGNOSIS — J309 Allergic rhinitis, unspecified: Secondary | ICD-10-CM | POA: Diagnosis not present

## 2018-03-29 DIAGNOSIS — M1990 Unspecified osteoarthritis, unspecified site: Secondary | ICD-10-CM | POA: Diagnosis not present

## 2018-03-29 DIAGNOSIS — E782 Mixed hyperlipidemia: Secondary | ICD-10-CM | POA: Diagnosis not present

## 2018-03-29 DIAGNOSIS — I10 Essential (primary) hypertension: Secondary | ICD-10-CM | POA: Diagnosis not present

## 2018-03-29 DIAGNOSIS — F4321 Adjustment disorder with depressed mood: Secondary | ICD-10-CM | POA: Diagnosis not present

## 2018-03-29 DIAGNOSIS — Z853 Personal history of malignant neoplasm of breast: Secondary | ICD-10-CM | POA: Diagnosis not present

## 2018-03-29 DIAGNOSIS — H3530 Unspecified macular degeneration: Secondary | ICD-10-CM | POA: Diagnosis not present

## 2018-03-29 DIAGNOSIS — M8580 Other specified disorders of bone density and structure, unspecified site: Secondary | ICD-10-CM | POA: Diagnosis not present

## 2018-03-29 DIAGNOSIS — D696 Thrombocytopenia, unspecified: Secondary | ICD-10-CM | POA: Diagnosis not present

## 2018-03-29 DIAGNOSIS — C9110 Chronic lymphocytic leukemia of B-cell type not having achieved remission: Secondary | ICD-10-CM | POA: Diagnosis not present

## 2018-05-03 DIAGNOSIS — H31092 Other chorioretinal scars, left eye: Secondary | ICD-10-CM | POA: Diagnosis not present

## 2018-05-03 DIAGNOSIS — H35423 Microcystoid degeneration of retina, bilateral: Secondary | ICD-10-CM | POA: Diagnosis not present

## 2018-05-03 DIAGNOSIS — H353221 Exudative age-related macular degeneration, left eye, with active choroidal neovascularization: Secondary | ICD-10-CM | POA: Diagnosis not present

## 2018-05-03 DIAGNOSIS — H353213 Exudative age-related macular degeneration, right eye, with inactive scar: Secondary | ICD-10-CM | POA: Diagnosis not present

## 2018-05-17 ENCOUNTER — Other Ambulatory Visit: Payer: PPO

## 2018-05-17 ENCOUNTER — Inpatient Hospital Stay: Payer: PPO | Attending: Hematology

## 2018-05-17 ENCOUNTER — Ambulatory Visit: Payer: PPO | Admitting: Hematology

## 2018-05-17 ENCOUNTER — Inpatient Hospital Stay (HOSPITAL_BASED_OUTPATIENT_CLINIC_OR_DEPARTMENT_OTHER): Payer: PPO | Admitting: Adult Health

## 2018-05-17 ENCOUNTER — Telehealth: Payer: Self-pay | Admitting: Adult Health

## 2018-05-17 ENCOUNTER — Encounter: Payer: Self-pay | Admitting: Adult Health

## 2018-05-17 VITALS — BP 154/87 | HR 79 | Temp 97.6°F | Resp 17 | Ht 60.25 in

## 2018-05-17 DIAGNOSIS — Z853 Personal history of malignant neoplasm of breast: Secondary | ICD-10-CM

## 2018-05-17 DIAGNOSIS — Z170 Estrogen receptor positive status [ER+]: Principal | ICD-10-CM

## 2018-05-17 DIAGNOSIS — C50912 Malignant neoplasm of unspecified site of left female breast: Secondary | ICD-10-CM

## 2018-05-17 DIAGNOSIS — C9110 Chronic lymphocytic leukemia of B-cell type not having achieved remission: Secondary | ICD-10-CM | POA: Diagnosis not present

## 2018-05-17 LAB — CBC WITH DIFFERENTIAL (CANCER CENTER ONLY)
BASOS PCT: 0 %
Basophils Absolute: 0.1 10*3/uL (ref 0.0–0.1)
Eosinophils Absolute: 0.1 10*3/uL (ref 0.0–0.5)
Eosinophils Relative: 0 %
HEMATOCRIT: 45 % (ref 34.8–46.6)
Hemoglobin: 14.6 g/dL (ref 11.6–15.9)
LYMPHS PCT: 90 %
Lymphs Abs: 32.5 10*3/uL — ABNORMAL HIGH (ref 0.9–3.3)
MCH: 30.6 pg (ref 25.1–34.0)
MCHC: 32.4 g/dL (ref 31.5–36.0)
MCV: 94.3 fL (ref 79.5–101.0)
MONOS PCT: 2 %
Monocytes Absolute: 0.8 10*3/uL (ref 0.1–0.9)
NEUTROS ABS: 2.7 10*3/uL (ref 1.5–6.5)
NEUTROS PCT: 8 %
Platelet Count: 109 10*3/uL — ABNORMAL LOW (ref 145–400)
RBC: 4.77 MIL/uL (ref 3.70–5.45)
RDW: 14.3 % (ref 11.2–14.5)
WBC: 36.2 10*3/uL — AB (ref 3.9–10.3)

## 2018-05-17 LAB — COMPREHENSIVE METABOLIC PANEL
ALT: 11 U/L (ref 0–44)
AST: 19 U/L (ref 15–41)
Albumin: 3.9 g/dL (ref 3.5–5.0)
Alkaline Phosphatase: 90 U/L (ref 38–126)
Anion gap: 9 (ref 5–15)
BUN: 12 mg/dL (ref 8–23)
CHLORIDE: 105 mmol/L (ref 98–111)
CO2: 27 mmol/L (ref 22–32)
Calcium: 9.5 mg/dL (ref 8.9–10.3)
Creatinine, Ser: 0.69 mg/dL (ref 0.44–1.00)
GFR calc Af Amer: >60 mL/min (ref >60)
GFR calc non Af Amer: >60 mL/min (ref >60)
Glucose, Bld: 82 mg/dL (ref 70–99)
POTASSIUM: 4.1 mmol/L (ref 3.5–5.1)
Sodium: 141 mmol/L (ref 135–145)
Total Bilirubin: 0.7 mg/dL (ref 0.3–1.2)
Total Protein: 6.7 g/dL (ref 6.5–8.1)

## 2018-05-17 LAB — RETICULOCYTES
RBC.: 4.77 MIL/uL (ref 3.70–5.45)
RETIC COUNT ABSOLUTE: 33.4 10*3/uL — AB (ref 33.7–90.7)
Retic Ct Pct: 0.7 % (ref 0.7–2.1)

## 2018-05-17 NOTE — Telephone Encounter (Signed)
Gave pt avs and calendar  °

## 2018-05-17 NOTE — Assessment & Plan Note (Addendum)
Stable, with mild increase in WBC.  Asymptomatic.  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  Will continue to monitor with lab work and she will f/u with Dr. Irene Limbo in one year.

## 2018-05-17 NOTE — Progress Notes (Signed)
[facility] Follow up:    Abigail Neer, MD [address]  [place] [address]   DIAGNOSIS: Cancer Staging Breast cancer [facility]) Staging form: Breast, AJCC 7th Edition - Clinical: Stage IA (T1, N0, cM0) - Unsigned - Pathologic: No stage assigned - Unsigned CLL  SUMMARY OF ONCOLOGIC HISTORY: Per Dr. Irene Limbo note from 07/15/2017  CLL was diagnosed in 2004 and has not required intervention; the slightly progressive thrombocytopenia has not been symptomatic. Abdominal US in Oct 2012 had normal splenic volume. Platelets have been 90-100k back to 2011. She had two short courses of prednisone at just 20 mg daily x 3 every other week in early 2013, tolerated without difficulty and some increase in platelets noted.   PMH is also significant for an early stage left breast cancer in 2004, treated with lumpectomy with sentinel node evaluation, local RT and 5 years of Femara thru 12-2007.  CURRENT THERAPY: observation  INTERVAL HISTORY: Abigail Moreno 84 y.o. female returns for evaluation and monitoring of her CLL.  She is doing well today.  She tells me that she lost some weight back in Mar 19, 2023 when her husband passed away.  She is still processing the grief and she says that she misses him terrible.    Ruchel denies any night sweats, fatigue, aches/pains, or any other concerns today.  Note that she has a h/o breast cancer from 2004.  She says that she does not routinely undergo breast exams.  She did undergo a mammogram earlier this year at [facility].  She says that it was normal.     Patient Active Problem List   Diagnosis Date Noted  . Environmental allergies 07/19/2016  . History of left breast cancer 10/23/2015  . Status post hysterectomy 06/15/2015  . Unresolved grief 06/15/2015  . Thrombocytopenia ([place]) 06/15/2015  . Need for prophylactic vaccination and inoculation against influenza 06/15/2015  . Mixed hyperlipidemia 03/16/2015  . Osteopenia  03/16/2015  . Osteoarthritis 03/16/2015  . Vaginal atrophy 03/16/2015  . Papanicolaou smear for cervical cancer screening 03/16/2015  . Macular degeneration 01/07/2015  . S/P right THA, AA 12/26/2011  . CLL (chronic lymphocytic leukemia) ([place]) 11/18/2011  . Breast cancer ([place]) 11/18/2011    is allergic to colesevelam hcl; ezetimibe; procaine hcl; rosuvastatin; simvastatin; statins; and sulfa antibiotics.  MEDICAL HISTORY: Past Medical History:  Diagnosis Date  . Anxiety   . Arthritis of knee  FALL 2012   RIGHT KNEE, hips, and fingers  . Breast cancer ([place]) FEBRUARY 2004   T1, NO, ER/PR POSITIVE LEFT BREAST  . CLL (chronic lymphocytic leukemia) ([place]) 2004   Dr. Marko Plume is oncologist  . History of blood transfusion    with first and maybe second delivery  . Hyperlipemia   . Hypertension   . Macular degeneration   . Osteoporosis    borderline    SURGICAL HISTORY: Past Surgical History:  Procedure Laterality Date  . ABDOMINAL HYSTERECTOMY  1972   UNILATERAL OOPHORECTOMY( LEFT)  . APPENDECTOMY    . EYE SURGERY     left eye surgery   . INCONTINENCE SURGERY    . MASTECTOMY PARTIAL / LUMPECTOMY W/ AXILLARY LYMPHADENECTOMY  FEBRUARY 2004   LEFT BREAST  . OTHER SURGICAL HISTORY     cysts removed from breasts   . TOTAL HIP ARTHROPLASTY  12/26/2011   Procedure: TOTAL HIP ARTHROPLASTY ANTERIOR APPROACH;  Surgeon: Mauri Pole, MD;  Location: [facility];  Service: Orthopedics;  Laterality: Right;    SOCIAL  HISTORY: Social History   Socioeconomic History  . Marital status: Married    Spouse name: Not on file  . Number of children: Not on file  . Years of education: Not on file  . Highest education level: Not on file  Occupational History  . Not on file  Social Needs  . Financial resource strain: Not on file  . Food insecurity:    Worry: Not on file    Inability: Not on file  . Transportation needs:    Medical: Not on file    Non-medical: Not on file  Tobacco Use  .  Smoking status: Former Smoker    Last attempt to quit: 09/18/1958    Years since quitting: 59.7  . Smokeless tobacco: Never Used  Substance and Sexual Activity  . Alcohol use: No    Alcohol/week: 0.0 standard drinks  . Drug use: No  . Sexual activity: Not Currently    Birth control/protection: Surgical    Comment: hysterectomy  Lifestyle  . Physical activity:    Days per week: Not on file    Minutes per session: Not on file  . Stress: Not on file  Relationships  . Social connections:    Talks on phone: Not on file    Gets together: Not on file    Attends religious service: Not on file    Active member of club or organization: Not on file    Attends meetings of clubs or organizations: Not on file    Relationship status: Not on file  . Intimate partner violence:    Fear of current or ex partner: Not on file    Emotionally abused: Not on file    Physically abused: Not on file    Forced sexual activity: Not on file  Other Topics Concern  . Not on file  Social History Narrative  . Not on file    FAMILY HISTORY: Family History  Problem Relation Age of Onset  . Pancreatic cancer Maternal Grandmother   . Cancer Paternal Grandmother        ? type   . Cancer Other        breast-1st paternal cousin-no treatment, liver cancer-2nd maternal cousin  . Hypertension Mother   . Hypertension Sister        x2  . High Cholesterol Sister        x2  . Heart attack Father     Review of Systems  Constitutional: Negative for appetite change, chills, fatigue, fever and unexpected weight change.  HENT:   Negative for hearing loss, lump/mass, sore throat and trouble swallowing.   Eyes: Negative for eye problems and icterus.  Respiratory: Negative for chest tightness, cough and shortness of breath.   Cardiovascular: Negative for chest pain, leg swelling and palpitations.  Gastrointestinal: Negative for abdominal distention, abdominal pain, constipation, diarrhea, nausea and vomiting.   Endocrine: Negative for hot flashes.  Skin: Negative for itching and rash.  Neurological: Negative for dizziness, extremity weakness, headaches, light-headedness and numbness.  Hematological: Negative for adenopathy. Does not bruise/bleed easily.  Psychiatric/Behavioral: Negative for depression. The patient is not nervous/anxious.       PHYSICAL EXAMINATION  ECOG PERFORMANCE STATUS: 0 - Asymptomatic  Vitals:   05/17/18 1017  BP: (!) 154/87  Pulse: 79  Resp: 17  Temp: 97.6 F (36.4 C)  SpO2: 94%    Physical Exam  Constitutional: She is oriented to person, place, and time. She appears well-developed and well-nourished.  HENT:  Head: Normocephalic and  atraumatic.  Mouth/Throat: Oropharynx is clear and moist. No oropharyngeal exudate.  Eyes: Pupils are equal, round, and reactive to light. No scleral icterus.  Neck: Neck supple.  + supraclavicular adenopathy bilaterally, non tender  Cardiovascular: Normal rate, regular rhythm and normal heart sounds.  Pulmonary/Chest: Effort normal and breath sounds normal.  Bilateral breast exam without any suspicious breast nodules, or masses  Abdominal: Soft. Bowel sounds are normal. She exhibits no distension and no mass. There is no tenderness. There is no rebound and no guarding.  Musculoskeletal: She exhibits no edema.  Lymphadenopathy:    She has no cervical adenopathy.  Neurological: She is alert and oriented to person, place, and time.  Skin: Skin is warm and dry. Capillary refill takes less than 2 seconds.  Psychiatric: She has a normal mood and affect.    LABORATORY DATA:  CBC    Component Value Date/Time   WBC 36.2 (H) 05/17/2018 0951   WBC 34.9 (H) 07/16/2017 0955   WBC 24.1 (H) 12/28/2011 0420   RBC 4.77 05/17/2018 0951   RBC 4.77 05/17/2018 0951   HGB 14.6 05/17/2018 0951   HGB 15.3 07/16/2017 0955   HCT 45.0 05/17/2018 0951   HCT 46.4 07/16/2017 0955   PLT 109 (L) 05/17/2018 0951   PLT 90 (L) 07/16/2017 0955    MCV 94.3 05/17/2018 0951   MCV 94.3 07/16/2017 0955   MCH 30.6 05/17/2018 0951   MCHC 32.4 05/17/2018 0951   RDW 14.3 05/17/2018 0951   RDW 14.3 07/16/2017 0955   LYMPHSABS 32.5 (H) 05/17/2018 0951   LYMPHSABS 31.3 (H) 07/16/2017 0955   MONOABS 0.8 05/17/2018 0951   MONOABS 0.8 07/16/2017 0955   EOSABS 0.1 05/17/2018 0951   EOSABS 0.1 07/16/2017 0955   BASOSABS 0.1 05/17/2018 0951   BASOSABS 0.1 07/16/2017 0955    CMP     Component Value Date/Time   NA 141 05/17/2018 0951   NA 143 07/16/2017 0955   K 4.1 05/17/2018 0951   K 3.9 07/16/2017 0955   CL 105 05/17/2018 0951   CL 105 01/01/2013 1030   CO2 27 05/17/2018 0951   CO2 26 07/16/2017 0955   GLUCOSE 82 05/17/2018 0951   GLUCOSE 77 07/16/2017 0955   GLUCOSE 99 01/01/2013 1030   BUN 12 05/17/2018 0951   BUN 12.9 07/16/2017 0955   CREATININE 0.69 05/17/2018 0951   CREATININE 0.7 07/16/2017 0955   CALCIUM 9.5 05/17/2018 0951   CALCIUM 9.4 07/16/2017 0955   PROT 6.7 05/17/2018 0951   PROT 7.1 07/16/2017 0955   ALBUMIN 3.9 05/17/2018 0951   ALBUMIN 3.9 07/16/2017 0955   AST 19 05/17/2018 0951   AST 22 07/16/2017 0955   ALT 11 05/17/2018 0951   ALT 15 07/16/2017 0955   ALKPHOS 90 05/17/2018 0951   ALKPHOS 104 07/16/2017 0955   BILITOT 0.7 05/17/2018 0951   BILITOT 0.59 07/16/2017 0955   GFRNONAA >60 05/17/2018 0951   GFRAA >60 05/17/2018 0951        ASSESSMENT and THERAPY PLAN:   Breast cancer ([place]) Leroy has h/o breast cancer from 2004 (history noted above).  She is doing well.  We did breast exam today that was normal.  We called [facility] to get a copy of her mammogram to scan in.    CLL (chronic lymphocytic leukemia) Stable, with mild increase in WBC.  Asymptomatic.  I reviewed her cytogenetics with her, which show a 17 p deletion which has no impact at this point because she doesn't  need treatment.  Will continue to monitor with lab work and she will f/u with Dr. Irene Limbo in one year.    All questions were  answered. The patient knows to call the clinic with any problems, questions or concerns. We can certainly see the patient much sooner if necessary.  A total of (30) minutes of face-to-face time was spent with this patient with greater than 50% of that time in counseling and care-coordination.  This note was electronically signed. Scot Dock, NP 05/17/2018

## 2018-05-17 NOTE — Assessment & Plan Note (Signed)
Abigail Moreno has h/o breast cancer from 2004 (history noted above).  She is doing well.  We did breast exam today that was normal.  We called [facility] to get a copy of her mammogram to scan in.

## 2018-06-05 DIAGNOSIS — Z23 Encounter for immunization: Secondary | ICD-10-CM | POA: Diagnosis not present

## 2018-08-02 DIAGNOSIS — H35423 Microcystoid degeneration of retina, bilateral: Secondary | ICD-10-CM | POA: Diagnosis not present

## 2018-08-02 DIAGNOSIS — H353221 Exudative age-related macular degeneration, left eye, with active choroidal neovascularization: Secondary | ICD-10-CM | POA: Diagnosis not present

## 2018-08-02 DIAGNOSIS — H43813 Vitreous degeneration, bilateral: Secondary | ICD-10-CM | POA: Diagnosis not present

## 2018-08-02 DIAGNOSIS — H353213 Exudative age-related macular degeneration, right eye, with inactive scar: Secondary | ICD-10-CM | POA: Diagnosis not present

## 2018-08-23 DIAGNOSIS — M545 Low back pain: Secondary | ICD-10-CM | POA: Diagnosis not present

## 2018-08-23 DIAGNOSIS — M25552 Pain in left hip: Secondary | ICD-10-CM | POA: Diagnosis not present

## 2018-08-23 DIAGNOSIS — M1612 Unilateral primary osteoarthritis, left hip: Secondary | ICD-10-CM | POA: Diagnosis not present

## 2018-09-06 NOTE — H&P (Signed)
TOTAL HIP ADMISSION H&P  Patient is admitted for left total hip arthroplasty, anterior approach.  Subjective:  Chief Complaint: Left hip primary OA / pain  HPI: CANNA Moreno, 84 y.o. female, has a history of pain and functional disability in the left hip(s) due to arthritis and patient has failed non-surgical conservative treatments for greater than 12 weeks to include NSAID's and/or analgesics, use of assistive devices and activity modification.  Onset of symptoms was gradual starting 2+ years ago with gradually worsening course since that time.The patient noted prior procedures of the hip to include arthroplasty on the right hip(s).  Patient currently rates pain in the left hip at 9 out of 10 with activity. Patient has night pain, worsening of pain with activity and weight bearing, trendelenberg gait, pain that interfers with activities of daily living and pain with passive range of motion. Patient has evidence of periarticular osteophytes and joint space narrowing by imaging studies. This condition presents safety issues increasing the risk of falls.  There is no current active infection.  Risks, benefits and expectations were discussed with the patient.  Risks including but not limited to the risk of anesthesia, blood clots, nerve damage, blood vessel damage, failure of the prosthesis, infection and up to and including death.  Patient understand the risks, benefits and expectations and wishes to proceed with surgery.   PCP: Mayra Neer, MD  D/C Plans:       Home   Post-op Meds:       No Rx given   Tranexamic Acid:      To be given - IV   Decadron:      Is to be given  FYI:      Xarelto (no suppose to take ASA due to macular degeneration)  Norco  DME:   Pt already has equipment   PT:   No PT    Patient Active Problem List   Diagnosis Date Noted  . Environmental allergies 07/19/2016  . History of left breast cancer 10/23/2015  . Status post hysterectomy 06/15/2015  .  Unresolved grief 06/15/2015  . Thrombocytopenia ([place]) 06/15/2015  . Need for prophylactic vaccination and inoculation against influenza 06/15/2015  . Mixed hyperlipidemia 03/16/2015  . Osteopenia 03/16/2015  . Osteoarthritis 03/16/2015  . Vaginal atrophy 03/16/2015  . Papanicolaou smear for cervical cancer screening 03/16/2015  . Macular degeneration 01/07/2015  . S/P right THA, AA 12/26/2011  . CLL (chronic lymphocytic leukemia) ([place]) 11/18/2011  . Breast cancer ([place]) 11/18/2011   Past Medical History:  Diagnosis Date  . Anxiety   . Arthritis of knee  FALL 2012   RIGHT KNEE, hips, and fingers  . Breast cancer ([place]) FEBRUARY 2004   T1, NO, ER/PR POSITIVE LEFT BREAST  . CLL (chronic lymphocytic leukemia) ([place]) 2004   Dr. Marko Plume is oncologist  . History of blood transfusion    with first and maybe second delivery  . Hyperlipemia   . Hypertension   . Macular degeneration   . Osteoporosis    borderline    Past Surgical History:  Procedure Laterality Date  . ABDOMINAL HYSTERECTOMY  1972   UNILATERAL OOPHORECTOMY( LEFT)  . APPENDECTOMY    . EYE SURGERY     left eye surgery   . INCONTINENCE SURGERY    . MASTECTOMY PARTIAL / LUMPECTOMY W/ AXILLARY LYMPHADENECTOMY  FEBRUARY 2004   LEFT BREAST  . OTHER SURGICAL HISTORY     cysts removed from breasts   . TOTAL HIP ARTHROPLASTY  12/26/2011  Procedure: TOTAL HIP ARTHROPLASTY ANTERIOR APPROACH;  Surgeon: Mauri Pole, MD;  Location: [facility];  Service: Orthopedics;  Laterality: Right;    No current facility-administered medications for this encounter.    Current Outpatient Medications  Medication Sig Dispense Refill Last Dose  . ALPRAZolam (XANAX) 0.25 MG tablet Take 0.125 mg by mouth at bedtime.   0 Taking  . amLODipine-benazepril (LOTREL) 5-20 MG capsule Take 1 capsule by mouth daily as needed (elevated blood pressure).     . Calcium Carb-Cholecalciferol (CALCIUM 500 + D3 PO) Take 2 tablets by mouth daily.     . Cinnamon 500  MG capsule Take 500 mg by mouth daily.    Taking  . CRANBERRY PO Take 4,200 mg by mouth daily.     Marland Kitchen loratadine (CLARITIN) 10 MG tablet Take 10 mg by mouth daily at 12 noon.    Taking  . Multiple Vitamin (MULTIVITAMIN WITH MINERALS) TABS tablet Take 1 tablet by mouth daily. Women's One A Day 50+ Multivitamin     . Multiple Vitamins-Minerals (PRESERVISION AREDS 2 PO) Take 1 tablet by mouth 2 (two) times daily.     . Omega-3 Fatty Acids (FISH OIL) 1000 MG CAPS Take 1,000 mg by mouth 2 (two) times daily.     . pramoxine (PROCTOFOAM) 1 % foam Use as directed for hemorrhoids (Patient taking differently: Place 1 application rectally every 12 (twelve) hours as needed (hemorrhoids). Use as directed for hemorrhoids) 15 g 1 Taking  . sertraline (ZOLOFT) 50 MG tablet Take 25 mg by mouth at bedtime.     Marland Kitchen tobramycin (TOBREX) 0.3 % ophthalmic solution Place 1 drop into the left eye See admin instructions. Use 4 drop into left eye 4 times daily the day before, the day of, and the day after eye injection  5 Taking   Allergies  Allergen Reactions  . Colesevelam Hcl Other (See Comments)     aches  . Ezetimibe Other (See Comments)    aches  . Procaine Hcl Swelling  . Rosuvastatin Other (See Comments)    aches  . Simvastatin Other (See Comments)    aches  . Statins Other (See Comments)    aches  . Sulfa Antibiotics Other (See Comments)    Bacteria in intestines     Social History   Tobacco Use  . Smoking status: Former Smoker    Last attempt to quit: 09/18/1958    Years since quitting: 60.0  . Smokeless tobacco: Never Used  Substance Use Topics  . Alcohol use: No    Alcohol/week: 0.0 standard drinks    Family History  Problem Relation Age of Onset  . Pancreatic cancer Maternal Grandmother   . Cancer Paternal Grandmother        ? type   . Cancer Other        breast-1st paternal cousin-no treatment, liver cancer-2nd maternal cousin  . Hypertension Mother   . Hypertension Sister        x2  .  High Cholesterol Sister        x2  . Heart attack Father      Review of Systems  Constitutional: Negative.   HENT: Negative.   Eyes: Negative.   Respiratory: Positive for shortness of breath (with exertion).   Cardiovascular: Negative.   Gastrointestinal: Negative.   Genitourinary: Negative.   Musculoskeletal: Positive for joint pain.  Skin: Negative.   Neurological: Negative.   Endo/Heme/Allergies: Negative.   Psychiatric/Behavioral: The patient is nervous/anxious.     Objective:  Physical Exam  Constitutional: She is oriented to person, place, and time. She appears well-developed.  HENT:  Head: Normocephalic.  Eyes: Pupils are equal, round, and reactive to light.  Neck: Neck supple. No JVD present. No tracheal deviation present. No thyromegaly present.  Cardiovascular: Normal rate, regular rhythm and intact distal pulses.  Respiratory: Effort normal and breath sounds normal. No respiratory distress. She has no wheezes.  GI: Soft. There is no abdominal tenderness. There is no guarding.  Musculoskeletal:     Left hip: She exhibits decreased range of motion, decreased strength, tenderness and bony tenderness. She exhibits no swelling, no deformity and no laceration.  Lymphadenopathy:    She has no cervical adenopathy.  Neurological: She is alert and oriented to person, place, and time.  Skin: Skin is warm and dry.  Psychiatric: She has a normal mood and affect.      Labs:  Estimated body mass index is 28.57 kg/m as calculated from the following:   Height as of 05/17/18: 5' 0.25" (1.53 m).   Weight as of 07/16/17: 66.9 kg.   Imaging Review Plain radiographs demonstrate severe degenerative joint disease of the left hip. The bone quality appears to be good for age and reported activity level.    Preoperative templating of the joint replacement has been completed, documented, and submitted to the Operating Room personnel in order to optimize intra-operative equipment  management.     Assessment/Plan:  End stage arthritis, left hip  The patient history, physical examination, clinical judgement of the provider and imaging studies are consistent with end stage degenerative joint disease of the left hip and total hip arthroplasty is deemed medically necessary. The treatment options including medical management, injection therapy, arthroscopy and arthroplasty were discussed at length. The risks and benefits of total hip arthroplasty were presented and reviewed. The risks due to aseptic loosening, infection, stiffness, dislocation/subluxation,  thromboembolic complications and other imponderables were discussed.  The patient acknowledged the explanation, agreed to proceed with the plan and consent was signed. Patient is being admitted for inpatient treatment for surgery, pain control, PT, OT, prophylactic antibiotics, VTE prophylaxis, progressive ambulation and ADL's and discharge planning.The patient is planning to be discharged home.    [place] Karalyne Nusser   PA-C  09/06/2018, 11:00 AM

## 2018-09-09 ENCOUNTER — Other Ambulatory Visit (HOSPITAL_COMMUNITY): Payer: Self-pay | Admitting: *Deleted

## 2018-09-09 NOTE — Patient Instructions (Addendum)
Abigail Moreno  09/09/2018   Your procedure is scheduled on: 09-17-18  Report to [facility] Main  Entrance  Report to admitting at 610 AM    Call this number if you have problems the morning of surgery [phone]   Remember: Do not eat food or drink liquids :After Midnight. BRUSH YOUR TEETH MORNING OF SURGERY AND RINSE YOUR MOUTH OUT, NO CHEWING GUM CANDY OR MINTS.     Take these medicines the morning of surgery with A SIP OF WATER: TOBREX EYE DROP, NITROFURATONIN, XANAX IF NEEDED                                You may not have any metal on your body including hair pins and              piercings  Do not wear jewelry, make-up, lotions, powders or perfumes, deodorant             Do not wear nail polish.  Do not shave  48 hours prior to surgery.              Men may shave face and neck.   Do not bring valuables to the hospital. [place].  Contacts, dentures or bridgework may not be worn into surgery.  Leave suitcase in the car. After surgery it may be brought to your room.                  Please read over the following fact sheets you were given: _____________________________________________________________________             [facility] - Preparing for Surgery Before surgery, you can play an important role.  Because skin is not sterile, your skin needs to be as free of germs as possible.  You can reduce the number of germs on your skin by washing with CHG (chlorahexidine gluconate) soap before surgery.  CHG is an antiseptic cleaner which kills germs and bonds with the skin to continue killing germs even after washing. Please DO NOT use if you have an allergy to CHG or antibacterial soaps.  If your skin becomes reddened/irritated stop using the CHG and inform your nurse when you arrive at Short Stay. Do not shave (including legs and underarms) for at least 48 hours prior to the first CHG shower.   You may shave your face/neck. Please follow these instructions carefully:  1.  Shower with CHG Soap the night before surgery and the  morning of Surgery.  2.  If you choose to wash your hair, wash your hair first as usual with your  normal  shampoo.  3.  After you shampoo, rinse your hair and body thoroughly to remove the  shampoo.                           4.  Use CHG as you would any other liquid soap.  You can apply chg directly  to the skin and wash                       Gently with a scrungie or clean washcloth.  5.  Apply the CHG Soap to your  body ONLY FROM THE NECK DOWN.   Do not use on face/ open                           Wound or open sores. Avoid contact with eyes, ears mouth and genitals (private parts).                       Wash face,  Genitals (private parts) with your normal soap.             6.  Wash thoroughly, paying special attention to the area where your surgery  will be performed.  7.  Thoroughly rinse your body with warm water from the neck down.  8.  DO NOT shower/wash with your normal soap after using and rinsing off  the CHG Soap.                9.  Pat yourself dry with a clean towel.            10.  Wear clean pajamas.            11.  Place clean sheets on your bed the night of your first shower and do not  sleep with pets. Day of Surgery : Do not apply any lotions/deodorants the morning of surgery.  Please wear clean clothes to the hospital/surgery center.  FAILURE TO FOLLOW THESE INSTRUCTIONS MAY RESULT IN THE CANCELLATION OF YOUR SURGERY PATIENT SIGNATURE_________________________________  NURSE SIGNATURE__________________________________  ________________________________________________________________________   Adam Phenix  An incentive spirometer is a tool that can help keep your lungs clear and active. This tool measures how well you are filling your lungs with each breath. Taking long deep breaths may help reverse or decrease the chance of  developing breathing (pulmonary) problems (especially infection) following:  A long period of time when you are unable to move or be active. BEFORE THE PROCEDURE   If the spirometer includes an indicator to show your best effort, your nurse or respiratory therapist will set it to a desired goal.  If possible, sit up straight or lean slightly forward. Try not to slouch.  Hold the incentive spirometer in an upright position. INSTRUCTIONS FOR USE  1. Sit on the edge of your bed if possible, or sit up as far as you can in bed or on a chair. 2. Hold the incentive spirometer in an upright position. 3. Breathe out normally. 4. Place the mouthpiece in your mouth and seal your lips tightly around it. 5. Breathe in slowly and as deeply as possible, raising the piston or the ball toward the top of the column. 6. Hold your breath for 3-5 seconds or for as long as possible. Allow the piston or ball to fall to the bottom of the column. 7. Remove the mouthpiece from your mouth and breathe out normally. 8. Rest for a few seconds and repeat Steps 1 through 7 at least 10 times every 1-2 hours when you are awake. Take your time and take a few normal breaths between deep breaths. 9. The spirometer may include an indicator to show your best effort. Use the indicator as a goal to work toward during each repetition. 10. After each set of 10 deep breaths, practice coughing to be sure your lungs are clear. If you have an incision (the cut made at the time of surgery), support your incision when coughing by placing a pillow or rolled up towels firmly against it.  Once you are able to get out of bed, walk around indoors and cough well. You may stop using the incentive spirometer when instructed by your caregiver.  RISKS AND COMPLICATIONS  Take your time so you do not get dizzy or light-headed.  If you are in pain, you may need to take or ask for pain medication before doing incentive spirometry. It is harder to take a  deep breath if you are having pain. AFTER USE  Rest and breathe slowly and easily.  It can be helpful to keep track of a log of your progress. Your caregiver can provide you with a simple table to help with this. If you are using the spirometer at home, follow these instructions: [place] IF:   You are having difficultly using the spirometer.  You have trouble using the spirometer as often as instructed.  Your pain medication is not giving enough relief while using the spirometer.  You develop fever of 100.5 F (38.1 C) or higher. SEEK IMMEDIATE MEDICAL CARE IF:   You cough up bloody sputum that had not been present before.  You develop fever of 102 F (38.9 C) or greater.  You develop worsening pain at or near the incision site. MAKE SURE YOU:   Understand these instructions.  Will watch your condition.  Will get help right away if you are not doing well or get worse. Document Released: 01/15/2007 Document Revised: 11/27/2011 Document Reviewed: 03/18/2007 Exit[employer] Patient Information 2014 Exit[employer], [place].   ________________________________________________________________________  WHAT IS A BLOOD TRANSFUSION? Blood Transfusion Information  A transfusion is the replacement of blood or some of its parts. Blood is made up of multiple cells which provide different functions.  Red blood cells carry oxygen and are used for blood loss replacement.  White blood cells fight against infection.  Platelets control bleeding.  Plasma helps clot blood.  Other blood products are available for specialized needs, such as hemophilia or other clotting disorders. BEFORE THE TRANSFUSION  Who gives blood for transfusions?   Healthy volunteers who are fully evaluated to make sure their blood is safe. This is blood bank blood. Transfusion therapy is the safest it has ever been in the practice of medicine. Before blood is taken from a donor, a complete history is taken to make  sure that person has no history of diseases nor engages in risky social behavior (examples are intravenous drug use or sexual activity with multiple partners). The donor's travel history is screened to minimize risk of transmitting infections, such as malaria. The donated blood is tested for signs of infectious diseases, such as HIV and hepatitis. The blood is then tested to be sure it is compatible with you in order to minimize the chance of a transfusion reaction. If you or a relative donates blood, this is often done in anticipation of surgery and is not appropriate for emergency situations. It takes many days to process the donated blood. RISKS AND COMPLICATIONS Although transfusion therapy is very safe and saves many lives, the main dangers of transfusion include:   Getting an infectious disease.  Developing a transfusion reaction. This is an allergic reaction to something in the blood you were given. Every precaution is taken to prevent this. The decision to have a blood transfusion has been considered carefully by your caregiver before blood is given. Blood is not given unless the benefits outweigh the risks. AFTER THE TRANSFUSION  Right after receiving a blood transfusion, you will usually feel much better and more energetic.  This is especially true if your red blood cells have gotten low (anemic). The transfusion raises the level of the red blood cells which carry oxygen, and this usually causes an energy increase.  The nurse administering the transfusion will monitor you carefully for complications. HOME CARE INSTRUCTIONS  No special instructions are needed after a transfusion. You may find your energy is better. Speak with your caregiver about any limitations on activity for underlying diseases you may have. SEEK MEDICAL CARE IF:   Your condition is not improving after your transfusion.  You develop redness or irritation at the intravenous (IV) site. SEEK IMMEDIATE MEDICAL CARE IF:   Any of the following symptoms occur over the next 12 hours:  Shaking chills.  You have a temperature by mouth above 102 F (38.9 C), not controlled by medicine.  Chest, back, or muscle pain.  People around you feel you are not acting correctly or are confused.  Shortness of breath or difficulty breathing.  Dizziness and fainting.  You get a rash or develop hives.  You have a decrease in urine output.  Your urine turns a dark color or changes to pink, red, or brown. Any of the following symptoms occur over the next 10 days:  You have a temperature by mouth above 102 F (38.9 C), not controlled by medicine.  Shortness of breath.  Weakness after normal activity.  The white part of the eye turns yellow (jaundice).  You have a decrease in the amount of urine or are urinating less often.  Your urine turns a dark color or changes to pink, red, or brown. Document Released: 09/01/2000 Document Revised: 11/27/2011 Document Reviewed: 04/20/2008 [facility] Patient Information 2014 [place], [place].  _______________________________________________________________________

## 2018-09-12 DIAGNOSIS — R300 Dysuria: Secondary | ICD-10-CM | POA: Diagnosis not present

## 2018-09-12 DIAGNOSIS — N390 Urinary tract infection, site not specified: Secondary | ICD-10-CM | POA: Diagnosis not present

## 2018-09-13 ENCOUNTER — Encounter (HOSPITAL_COMMUNITY): Payer: Self-pay

## 2018-09-13 ENCOUNTER — Other Ambulatory Visit: Payer: Self-pay

## 2018-09-13 ENCOUNTER — Encounter (HOSPITAL_COMMUNITY)
Admission: RE | Admit: 2018-09-13 | Discharge: 2018-09-13 | Disposition: A | Discharge status: Home / Self Care | Payer: PPO | Source: Ambulatory Visit | Attending: Orthopedic Surgery | Admitting: Orthopedic Surgery

## 2018-09-13 DIAGNOSIS — R9431 Abnormal electrocardiogram [ECG] [EKG]: Secondary | ICD-10-CM | POA: Diagnosis not present

## 2018-09-13 DIAGNOSIS — Z01818 Encounter for other preprocedural examination: Secondary | ICD-10-CM | POA: Diagnosis not present

## 2018-09-13 DIAGNOSIS — M1612 Unilateral primary osteoarthritis, left hip: Secondary | ICD-10-CM | POA: Insufficient documentation

## 2018-09-13 DIAGNOSIS — I10 Essential (primary) hypertension: Secondary | ICD-10-CM | POA: Diagnosis not present

## 2018-09-13 HISTORY — DX: Urinary tract infection, site not specified: N39.0

## 2018-09-13 HISTORY — DX: Dyspnea, unspecified: R06.00

## 2018-09-13 LAB — CBC
HCT: 45 % (ref 36.0–46.0)
HEMOGLOBIN: 14.2 g/dL (ref 12.0–15.0)
MCH: 31.1 pg (ref 26.0–34.0)
MCHC: 31.6 g/dL (ref 30.0–36.0)
MCV: 98.5 fL (ref 80.0–100.0)
Platelets: 110 10*3/uL — ABNORMAL LOW (ref 150–400)
RBC: 4.57 MIL/uL (ref 3.87–5.11)
RDW: 13.9 % (ref 11.5–15.5)
WBC: 35.3 10*3/uL — ABNORMAL HIGH (ref 4.0–10.5)
nRBC: 0 % (ref 0.0–0.2)

## 2018-09-13 LAB — BASIC METABOLIC PANEL
Anion gap: 10 (ref 5–15)
BUN: 11 mg/dL (ref 8–23)
CO2: 26 mmol/L (ref 22–32)
Calcium: 9 mg/dL (ref 8.9–10.3)
Chloride: 102 mmol/L (ref 98–111)
Creatinine, Ser: 0.54 mg/dL (ref 0.44–1.00)
GFR calc Af Amer: >60 mL/min (ref >60)
Glucose, Bld: 90 mg/dL (ref 70–99)
Potassium: 4 mmol/L (ref 3.5–5.1)
Sodium: 138 mmol/L (ref 135–145)

## 2018-09-13 LAB — SURGICAL PCR SCREEN
MRSA, PCR: NEGATIVE
Staphylococcus aureus: NEGATIVE

## 2018-09-13 NOTE — Progress Notes (Signed)
PCP: Mayra Neer phone [phone]  CARDIOLOGIST: none  INFO IN Epic: bmet elevated white count   INFO ON CHART: none   BLOOD THINNERS AND LAST DOSES:none ____________________________________  PATIENT SYMPTOMS AT TIME OF PREOP: patient on nitrofuratonin for urinary tract infection started doses 09-12-18.patient wothout complaints at pre op, no temperature.    chart given to jessica zanetto pa for follow up on abnormal white count

## 2018-09-13 NOTE — Anesthesia Preprocedure Evaluation (Addendum)
Anesthesia Evaluation  Patient identified by MRN, date of birth, ID band Patient awake    Reviewed: Allergy & Precautions, NPO status , Patient's Chart, lab work & pertinent test results  Airway Mallampati: II  TM Distance: >3 FB Neck ROM: Full    Dental  (+) Teeth Intact, Dental Advisory Given   Pulmonary former smoker,    Pulmonary exam normal breath sounds clear to auscultation       Cardiovascular hypertension, Pt. on medications Normal cardiovascular exam Rhythm:Regular Rate:Normal     Neuro/Psych PSYCHIATRIC DISORDERS Anxiety negative neurological ROS     GI/Hepatic negative GI ROS, Neg liver ROS,   Endo/Other  negative endocrine ROS  Renal/GU negative Renal ROS     Musculoskeletal  (+) Arthritis ,   Abdominal   Peds  Hematology  (+) Blood dyscrasia (Plt 110k), , CLL   Anesthesia Other Findings Day of surgery medications reviewed with the patient.  H/o breast cancer  Reproductive/Obstetrics                            Anesthesia Physical Anesthesia Plan  ASA: III  Anesthesia Plan: General   Post-op Pain Management:    Induction: Intravenous  PONV Risk Score and Plan: 3 and Treatment may vary due to age or medical condition, Ondansetron and Dexamethasone  Airway Management Planned: Oral ETT  Additional Equipment:   Intra-op Plan:   Post-operative Plan: Extubation in OR  Informed Consent: I have reviewed the patients History and Physical, chart, labs and discussed the procedure including the risks, benefits and alternatives for the proposed anesthesia with the patient or authorized representative who has indicated his/her understanding and acceptance.   Dental advisory given  Plan Discussed with: CRNA  Anesthesia Plan Comments:        Anesthesia Quick Evaluation

## 2018-09-17 ENCOUNTER — Encounter (HOSPITAL_COMMUNITY)
Admission: RE | Disposition: A | Discharge status: Home / Self Care | Payer: Self-pay | Source: Home / Self Care | Attending: Orthopedic Surgery

## 2018-09-17 ENCOUNTER — Inpatient Hospital Stay (HOSPITAL_COMMUNITY): Payer: PPO | Admitting: Anesthesiology

## 2018-09-17 ENCOUNTER — Inpatient Hospital Stay (HOSPITAL_COMMUNITY): Payer: PPO | Admitting: Physician Assistant

## 2018-09-17 ENCOUNTER — Inpatient Hospital Stay (HOSPITAL_COMMUNITY): Payer: PPO

## 2018-09-17 ENCOUNTER — Inpatient Hospital Stay (HOSPITAL_COMMUNITY)
Admission: RE | Admit: 2018-09-17 | Discharge: 2018-09-18 | DRG: 470 | Disposition: A | Discharge status: Home / Self Care | Payer: PPO | Attending: Orthopedic Surgery | Admitting: Orthopedic Surgery

## 2018-09-17 ENCOUNTER — Encounter (HOSPITAL_COMMUNITY): Payer: Self-pay | Admitting: *Deleted

## 2018-09-17 ENCOUNTER — Other Ambulatory Visit: Payer: Self-pay

## 2018-09-17 DIAGNOSIS — M1612 Unilateral primary osteoarthritis, left hip: Secondary | ICD-10-CM | POA: Diagnosis not present

## 2018-09-17 DIAGNOSIS — H3530 Unspecified macular degeneration: Secondary | ICD-10-CM | POA: Diagnosis present

## 2018-09-17 DIAGNOSIS — Z96642 Presence of left artificial hip joint: Secondary | ICD-10-CM | POA: Diagnosis not present

## 2018-09-17 DIAGNOSIS — M810 Age-related osteoporosis without current pathological fracture: Secondary | ICD-10-CM | POA: Diagnosis present

## 2018-09-17 DIAGNOSIS — Z87891 Personal history of nicotine dependence: Secondary | ICD-10-CM

## 2018-09-17 DIAGNOSIS — Z856 Personal history of leukemia: Secondary | ICD-10-CM | POA: Diagnosis not present

## 2018-09-17 DIAGNOSIS — F419 Anxiety disorder, unspecified: Secondary | ICD-10-CM | POA: Diagnosis present

## 2018-09-17 DIAGNOSIS — Z96641 Presence of right artificial hip joint: Secondary | ICD-10-CM | POA: Diagnosis present

## 2018-09-17 DIAGNOSIS — Z888 Allergy status to other drugs, medicaments and biological substances status: Secondary | ICD-10-CM | POA: Diagnosis not present

## 2018-09-17 DIAGNOSIS — E663 Overweight: Secondary | ICD-10-CM | POA: Diagnosis not present

## 2018-09-17 DIAGNOSIS — Z471 Aftercare following joint replacement surgery: Secondary | ICD-10-CM | POA: Diagnosis not present

## 2018-09-17 DIAGNOSIS — Z6825 Body mass index (BMI) 25.0-25.9, adult: Secondary | ICD-10-CM

## 2018-09-17 DIAGNOSIS — I10 Essential (primary) hypertension: Secondary | ICD-10-CM | POA: Diagnosis not present

## 2018-09-17 DIAGNOSIS — N390 Urinary tract infection, site not specified: Secondary | ICD-10-CM | POA: Diagnosis present

## 2018-09-17 DIAGNOSIS — Z853 Personal history of malignant neoplasm of breast: Secondary | ICD-10-CM | POA: Diagnosis not present

## 2018-09-17 DIAGNOSIS — Z90710 Acquired absence of both cervix and uterus: Secondary | ICD-10-CM

## 2018-09-17 DIAGNOSIS — Z79899 Other long term (current) drug therapy: Secondary | ICD-10-CM | POA: Diagnosis not present

## 2018-09-17 DIAGNOSIS — E782 Mixed hyperlipidemia: Secondary | ICD-10-CM | POA: Diagnosis not present

## 2018-09-17 DIAGNOSIS — Z96649 Presence of unspecified artificial hip joint: Secondary | ICD-10-CM

## 2018-09-17 DIAGNOSIS — Z8249 Family history of ischemic heart disease and other diseases of the circulatory system: Secondary | ICD-10-CM

## 2018-09-17 DIAGNOSIS — Z882 Allergy status to sulfonamides status: Secondary | ICD-10-CM

## 2018-09-17 DIAGNOSIS — M25552 Pain in left hip: Secondary | ICD-10-CM | POA: Diagnosis present

## 2018-09-17 DIAGNOSIS — Z419 Encounter for procedure for purposes other than remedying health state, unspecified: Secondary | ICD-10-CM

## 2018-09-17 HISTORY — PX: TOTAL HIP ARTHROPLASTY: SHX124

## 2018-09-17 LAB — TYPE AND SCREEN
ABO/RH(D): A POS
Antibody Screen: NEGATIVE

## 2018-09-17 SURGERY — ARTHROPLASTY, HIP, TOTAL, ANTERIOR APPROACH
Anesthesia: General | Site: Hip | Laterality: Left

## 2018-09-17 MED ORDER — ACETAMINOPHEN 10 MG/ML IV SOLN
INTRAVENOUS | Status: AC
  Filled 2018-09-17: qty 100

## 2018-09-17 MED ORDER — HYDROCODONE-ACETAMINOPHEN 7.5-325 MG PO TABS: 1.0000 | ORAL_TABLET | ORAL | 0 refills | Status: DC | PRN

## 2018-09-17 MED ORDER — CEFAZOLIN SODIUM-DEXTROSE 2-4 GM/100ML-% IV SOLN
2.0000 g | Freq: Four times a day (QID) | INTRAVENOUS | Status: AC
  Administered 2018-09-17 (×2): 2 g via INTRAVENOUS
  Filled 2018-09-17 (×3): qty 100

## 2018-09-17 MED ORDER — FERROUS SULFATE 325 (65 FE) MG PO TABS
325.0000 mg | ORAL_TABLET | Freq: Three times a day (TID) | ORAL | Status: DC
  Administered 2018-09-17 – 2018-09-18 (×3): 325 mg via ORAL
  Filled 2018-09-17 (×3): qty 1

## 2018-09-17 MED ORDER — PROPOFOL 10 MG/ML IV BOLUS
INTRAVENOUS | Status: AC
  Filled 2018-09-17: qty 40

## 2018-09-17 MED ORDER — SERTRALINE HCL 25 MG PO TABS
25.0000 mg | ORAL_TABLET | Freq: Every day | ORAL | Status: DC
  Administered 2018-09-17: 25 mg via ORAL
  Filled 2018-09-17: qty 1

## 2018-09-17 MED ORDER — POLYETHYLENE GLYCOL 3350 17 G PO PACK: 17.0000 g | PACK | Freq: Two times a day (BID) | ORAL | 0 refills | Status: DC

## 2018-09-17 MED ORDER — FERROUS SULFATE 325 (65 FE) MG PO TABS: 325.0000 mg | ORAL_TABLET | Freq: Three times a day (TID) | ORAL | 3 refills | Status: DC

## 2018-09-17 MED ORDER — TRANEXAMIC ACID-NACL 1000-0.7 MG/100ML-% IV SOLN
1000.0000 mg | Freq: Once | INTRAVENOUS | Status: AC
  Administered 2018-09-17: 1000 mg via INTRAVENOUS
  Filled 2018-09-17: qty 100

## 2018-09-17 MED ORDER — ROCURONIUM BROMIDE 10 MG/ML (PF) SYRINGE
PREFILLED_SYRINGE | INTRAVENOUS | Status: AC
  Filled 2018-09-17: qty 10

## 2018-09-17 MED ORDER — ALUM & MAG HYDROXIDE-SIMETH 200-200-20 MG/5ML PO SUSP: 15.0000 mL | ORAL | Status: DC | PRN

## 2018-09-17 MED ORDER — PROPOFOL 500 MG/50ML IV EMUL
INTRAVENOUS | Status: DC | PRN
  Administered 2018-09-17: 50 mg via INTRAVENOUS

## 2018-09-17 MED ORDER — MENTHOL 3 MG MT LOZG: 1.0000 | LOZENGE | OROMUCOSAL | Status: DC | PRN

## 2018-09-17 MED ORDER — HYDROCODONE-ACETAMINOPHEN 7.5-325 MG PO TABS: 1.0000 | ORAL_TABLET | ORAL | Status: DC | PRN

## 2018-09-17 MED ORDER — RIVAROXABAN 10 MG PO TABS
10.0000 mg | ORAL_TABLET | ORAL | Status: DC
  Administered 2018-09-18: 10 mg via ORAL
  Filled 2018-09-17: qty 1

## 2018-09-17 MED ORDER — RIVAROXABAN 10 MG PO TABS: 10.0000 mg | ORAL_TABLET | Freq: Every day | ORAL | 0 refills | Status: DC

## 2018-09-17 MED ORDER — FENTANYL CITRATE (PF) 100 MCG/2ML IJ SOLN
INTRAMUSCULAR | Status: DC | PRN
  Administered 2018-09-17 (×2): 50 ug via INTRAVENOUS

## 2018-09-17 MED ORDER — DEXAMETHASONE SODIUM PHOSPHATE 10 MG/ML IJ SOLN
10.0000 mg | Freq: Once | INTRAMUSCULAR | Status: AC
  Administered 2018-09-17: 10 mg via INTRAVENOUS

## 2018-09-17 MED ORDER — LACTATED RINGERS IV SOLN
INTRAVENOUS | Status: DC
  Administered 2018-09-17 (×2): via INTRAVENOUS

## 2018-09-17 MED ORDER — NITROFURANTOIN MACROCRYSTAL 100 MG PO CAPS
100.0000 mg | ORAL_CAPSULE | Freq: Two times a day (BID) | ORAL | Status: DC
  Administered 2018-09-17 – 2018-09-18 (×3): 100 mg via ORAL
  Filled 2018-09-17 (×3): qty 1

## 2018-09-17 MED ORDER — TRAMADOL HCL 50 MG PO TABS
50.0000 mg | ORAL_TABLET | Freq: Four times a day (QID) | ORAL | Status: DC
  Administered 2018-09-17 – 2018-09-18 (×4): 50 mg via ORAL
  Filled 2018-09-17 (×6): qty 1

## 2018-09-17 MED ORDER — ROCURONIUM BROMIDE 100 MG/10ML IV SOLN
INTRAVENOUS | Status: DC | PRN
  Administered 2018-09-17: 40 mg via INTRAVENOUS

## 2018-09-17 MED ORDER — METOCLOPRAMIDE HCL 5 MG PO TABS: 5.0000 mg | ORAL_TABLET | Freq: Three times a day (TID) | ORAL | Status: DC | PRN

## 2018-09-17 MED ORDER — TRANEXAMIC ACID-NACL 1000-0.7 MG/100ML-% IV SOLN
1000.0000 mg | INTRAVENOUS | Status: AC
  Administered 2018-09-17: 1000 mg via INTRAVENOUS
  Filled 2018-09-17: qty 100

## 2018-09-17 MED ORDER — METHOCARBAMOL 500 MG PO TABS: 500.0000 mg | ORAL_TABLET | Freq: Four times a day (QID) | ORAL | 0 refills | Status: DC | PRN

## 2018-09-17 MED ORDER — POLYETHYLENE GLYCOL 3350 17 G PO PACK
17.0000 g | PACK | Freq: Two times a day (BID) | ORAL | Status: DC
  Administered 2018-09-18: 17 g via ORAL
  Filled 2018-09-17 (×2): qty 1

## 2018-09-17 MED ORDER — DEXAMETHASONE SODIUM PHOSPHATE 10 MG/ML IJ SOLN
10.0000 mg | Freq: Once | INTRAMUSCULAR | Status: AC
  Administered 2018-09-18: 10 mg via INTRAVENOUS
  Filled 2018-09-17: qty 1

## 2018-09-17 MED ORDER — SODIUM CHLORIDE 0.9 % IV SOLN
INTRAVENOUS | Status: DC
  Administered 2018-09-17 (×2): via INTRAVENOUS

## 2018-09-17 MED ORDER — HYDROCODONE-ACETAMINOPHEN 5-325 MG PO TABS: 1.0000 | ORAL_TABLET | ORAL | Status: DC | PRN

## 2018-09-17 MED ORDER — LORATADINE 10 MG PO TABS
10.0000 mg | ORAL_TABLET | Freq: Every day | ORAL | Status: DC
  Administered 2018-09-18: 10 mg via ORAL
  Filled 2018-09-17: qty 1

## 2018-09-17 MED ORDER — CEFAZOLIN SODIUM-DEXTROSE 2-4 GM/100ML-% IV SOLN
2.0000 g | INTRAVENOUS | Status: AC
  Administered 2018-09-17: 2 g via INTRAVENOUS
  Filled 2018-09-17: qty 100

## 2018-09-17 MED ORDER — SUGAMMADEX SODIUM 200 MG/2ML IV SOLN
INTRAVENOUS | Status: DC | PRN
  Administered 2018-09-17: 125 mg via INTRAVENOUS

## 2018-09-17 MED ORDER — BISACODYL 10 MG RE SUPP: 10.0000 mg | Freq: Every day | RECTAL | Status: DC | PRN

## 2018-09-17 MED ORDER — DOCUSATE SODIUM 100 MG PO CAPS: 100.0000 mg | ORAL_CAPSULE | Freq: Two times a day (BID) | ORAL | 0 refills | Status: DC

## 2018-09-17 MED ORDER — AMLODIPINE BESYLATE 5 MG PO TABS
5.0000 mg | ORAL_TABLET | Freq: Once | ORAL | Status: AC
  Administered 2018-09-17: 5 mg via ORAL
  Filled 2018-09-17: qty 1

## 2018-09-17 MED ORDER — METOCLOPRAMIDE HCL 5 MG/ML IJ SOLN: 5.0000 mg | Freq: Three times a day (TID) | INTRAMUSCULAR | Status: DC | PRN

## 2018-09-17 MED ORDER — METHOCARBAMOL 500 MG PO TABS
500.0000 mg | ORAL_TABLET | Freq: Four times a day (QID) | ORAL | Status: DC | PRN
  Administered 2018-09-17 – 2018-09-18 (×3): 500 mg via ORAL
  Filled 2018-09-17 (×3): qty 1

## 2018-09-17 MED ORDER — EPHEDRINE 5 MG/ML INJ
INTRAVENOUS | Status: AC
  Filled 2018-09-17: qty 10

## 2018-09-17 MED ORDER — MAGNESIUM CITRATE PO SOLN: 1.0000 | Freq: Once | ORAL | Status: DC | PRN

## 2018-09-17 MED ORDER — ACETAMINOPHEN 325 MG PO TABS
325.0000 mg | ORAL_TABLET | Freq: Four times a day (QID) | ORAL | Status: DC | PRN
  Administered 2018-09-18: 650 mg via ORAL
  Filled 2018-09-17: qty 2

## 2018-09-17 MED ORDER — ONDANSETRON HCL 4 MG/2ML IJ SOLN: 4.0000 mg | Freq: Four times a day (QID) | INTRAMUSCULAR | Status: DC | PRN

## 2018-09-17 MED ORDER — SODIUM CHLORIDE 0.9 % IR SOLN
Status: DC | PRN
  Administered 2018-09-17: 1000 mL

## 2018-09-17 MED ORDER — DIPHENHYDRAMINE HCL 12.5 MG/5ML PO ELIX: 12.5000 mg | ORAL_SOLUTION | ORAL | Status: DC | PRN

## 2018-09-17 MED ORDER — LIDOCAINE 2% (20 MG/ML) 5 ML SYRINGE
INTRAMUSCULAR | Status: AC
  Filled 2018-09-17: qty 5

## 2018-09-17 MED ORDER — ALPRAZOLAM 0.25 MG PO TABS: 0.1250 mg | ORAL_TABLET | Freq: Every day | ORAL | Status: DC

## 2018-09-17 MED ORDER — FENTANYL CITRATE (PF) 100 MCG/2ML IJ SOLN
25.0000 ug | INTRAMUSCULAR | Status: DC | PRN
  Administered 2018-09-17: 50 ug via INTRAVENOUS

## 2018-09-17 MED ORDER — EPHEDRINE SULFATE 50 MG/ML IJ SOLN
INTRAMUSCULAR | Status: DC | PRN
  Administered 2018-09-17 (×2): 5 mg via INTRAVENOUS

## 2018-09-17 MED ORDER — METHOCARBAMOL 500 MG IVPB - SIMPLE MED
500.0000 mg | Freq: Four times a day (QID) | INTRAVENOUS | Status: DC | PRN
  Filled 2018-09-17: qty 50

## 2018-09-17 MED ORDER — FENTANYL CITRATE (PF) 100 MCG/2ML IJ SOLN
INTRAMUSCULAR | Status: AC
  Filled 2018-09-17: qty 2

## 2018-09-17 MED ORDER — ONDANSETRON HCL 4 MG/2ML IJ SOLN
INTRAMUSCULAR | Status: DC | PRN
  Administered 2018-09-17: 4 mg via INTRAVENOUS

## 2018-09-17 MED ORDER — HYDROMORPHONE HCL 1 MG/ML IJ SOLN: 0.5000 mg | INTRAMUSCULAR | Status: DC | PRN

## 2018-09-17 MED ORDER — LIDOCAINE HCL (CARDIAC) PF 100 MG/5ML IV SOSY
PREFILLED_SYRINGE | INTRAVENOUS | Status: DC | PRN
  Administered 2018-09-17: 60 mg via INTRATRACHEAL

## 2018-09-17 MED ORDER — FENTANYL CITRATE (PF) 250 MCG/5ML IJ SOLN
INTRAMUSCULAR | Status: AC
  Filled 2018-09-17: qty 5

## 2018-09-17 MED ORDER — ONDANSETRON HCL 4 MG PO TABS: 4.0000 mg | ORAL_TABLET | Freq: Four times a day (QID) | ORAL | Status: DC | PRN

## 2018-09-17 MED ORDER — CHLORHEXIDINE GLUCONATE 4 % EX LIQD: 60.0000 mL | Freq: Once | CUTANEOUS | Status: DC

## 2018-09-17 MED ORDER — ACETAMINOPHEN 10 MG/ML IV SOLN
INTRAVENOUS | Status: DC | PRN
  Administered 2018-09-17: 1000 mg via INTRAVENOUS

## 2018-09-17 MED ORDER — SUGAMMADEX SODIUM 200 MG/2ML IV SOLN
INTRAVENOUS | Status: AC
  Filled 2018-09-17: qty 2

## 2018-09-17 MED ORDER — STERILE WATER FOR IRRIGATION IR SOLN
Status: DC | PRN
  Administered 2018-09-17: 2000 mL

## 2018-09-17 MED ORDER — ONDANSETRON HCL 4 MG/2ML IJ SOLN: 4.0000 mg | Freq: Once | INTRAMUSCULAR | Status: DC | PRN

## 2018-09-17 MED ORDER — TOBRAMYCIN 0.3 % OP SOLN: 1.0000 [drp] | OPHTHALMIC | Status: DC

## 2018-09-17 MED ORDER — PHENOL 1.4 % MT LIQD: 1.0000 | OROMUCOSAL | Status: DC | PRN

## 2018-09-17 MED ORDER — DOCUSATE SODIUM 100 MG PO CAPS
100.0000 mg | ORAL_CAPSULE | Freq: Two times a day (BID) | ORAL | Status: DC
  Administered 2018-09-17 – 2018-09-18 (×2): 100 mg via ORAL
  Filled 2018-09-17 (×2): qty 1

## 2018-09-17 SURGICAL SUPPLY — 48 items
ADH SKN CLS APL DERMABOND .7 (GAUZE/BANDAGES/DRESSINGS) ×1
ARTICULEZE HEAD (Hips) ×3 IMPLANT
BAG DECANTER FOR FLEXI CONT (MISCELLANEOUS) IMPLANT
BAG SPEC THK2 15X12 ZIP CLS (MISCELLANEOUS)
BAG ZIPLOCK 12X15 (MISCELLANEOUS) IMPLANT
BLADE SAG 18X100X1.27 (BLADE) ×3 IMPLANT
BLADE SURG SZ10 CARB STEEL (BLADE) ×6 IMPLANT
COVER PERINEAL POST (MISCELLANEOUS) ×3 IMPLANT
COVER SURGICAL LIGHT HANDLE (MISCELLANEOUS) ×3 IMPLANT
COVER WAND RF STERILE (DRAPES) IMPLANT
CUP ACETBLR 52 OD PINNACLE (Hips) ×3 IMPLANT
DERMABOND ADVANCED (GAUZE/BANDAGES/DRESSINGS) ×2
DERMABOND ADVANCED .7 DNX12 (GAUZE/BANDAGES/DRESSINGS) ×1 IMPLANT
DRAPE STERI IOBAN 125X83 (DRAPES) ×3 IMPLANT
DRAPE U-SHAPE 47X51 STRL (DRAPES) ×6 IMPLANT
DRESSING AQUACEL AG SP 3.5X10 (GAUZE/BANDAGES/DRESSINGS) ×1 IMPLANT
DRSG AQUACEL AG SP 3.5X10 (GAUZE/BANDAGES/DRESSINGS) ×3
DURAPREP 26ML APPLICATOR (WOUND CARE) ×3 IMPLANT
ELECT REM PT RETURN 15FT ADLT (MISCELLANEOUS) ×3 IMPLANT
ELIMINATOR HOLE APEX DEPUY (Hips) ×3 IMPLANT
GLOVE BIOGEL M STRL SZ7.5 (GLOVE) ×3 IMPLANT
GLOVE BIOGEL PI IND STRL 7.5 (GLOVE) ×4 IMPLANT
GLOVE BIOGEL PI IND STRL 8.5 (GLOVE) IMPLANT
GLOVE BIOGEL PI INDICATOR 7.5 (GLOVE) ×8
GLOVE BIOGEL PI INDICATOR 8.5 (GLOVE)
GLOVE ECLIPSE 8.0 STRL XLNG CF (GLOVE) IMPLANT
GLOVE ORTHO TXT STRL SZ7.5 (GLOVE) ×6 IMPLANT
GLOVE SURG SS PI 7.5 STRL IVOR (GLOVE) ×3 IMPLANT
GOWN STRL REIN 2XL XLG LVL4 (GOWN DISPOSABLE) ×3 IMPLANT
GOWN STRL REUS W/TWL 2XL LVL3 (GOWN DISPOSABLE) IMPLANT
GOWN STRL REUS W/TWL LRG LVL3 (GOWN DISPOSABLE) ×3 IMPLANT
GOWN STRL REUS W/TWL XL LVL3 (GOWN DISPOSABLE) ×6 IMPLANT
HEAD ARTICULEZE (Hips) ×1 IMPLANT
HOLDER FOLEY CATH W/STRAP (MISCELLANEOUS) ×3 IMPLANT
LINER NEUTRAL 52X36MM PLUS 4 (Liner) ×3 IMPLANT
PACK ANTERIOR HIP CUSTOM (KITS) ×3 IMPLANT
SCREW 6.5MMX30MM (Screw) ×3 IMPLANT
STEM FEMORAL SZ6 HIGH ACTIS (Stem) ×3 IMPLANT
SUT MNCRL AB 4-0 PS2 18 (SUTURE) ×3 IMPLANT
SUT STRATAFIX 0 PDS 27 VIOLET (SUTURE) ×3
SUT VIC AB 1 CT1 36 (SUTURE) ×9 IMPLANT
SUT VIC AB 2-0 CT1 27 (SUTURE) ×6
SUT VIC AB 2-0 CT1 TAPERPNT 27 (SUTURE) ×2 IMPLANT
SUTURE STRATFX 0 PDS 27 VIOLET (SUTURE) ×1 IMPLANT
TRAY FOLEY CATH 14FRSI W/METER (CATHETERS) ×3 IMPLANT
TRAY FOLEY MTR SLVR 16FR STAT (SET/KITS/TRAYS/PACK) IMPLANT
WATER STERILE IRR 1000ML POUR (IV SOLUTION) ×3 IMPLANT
YANKAUER SUCT BULB TIP 10FT TU (MISCELLANEOUS) IMPLANT

## 2018-09-17 NOTE — Anesthesia Procedure Notes (Signed)
Procedure Name: Intubation Date/Time: 09/17/2018 8:31 AM Performed by: Glory Buff, CRNA Pre-anesthesia Checklist: Patient identified, Emergency Drugs available, Suction available and Patient being monitored Patient Re-evaluated:Patient Re-evaluated prior to induction Oxygen Delivery Method: Circle system utilized Preoxygenation: Pre-oxygenation with 100% oxygen Induction Type: IV induction Ventilation: Mask ventilation without difficulty Laryngoscope Size: Glidescope and 3 Grade View: Grade I Tube type: Oral Tube size: 7.0 mm Number of attempts: 2 (DL x 1 with Sabra Heck 3, unable to view cords, DL x 1 with glidescope,grade 1 view.) Airway Equipment and Method: Stylet and Oral airway Placement Confirmation: ETT inserted through vocal cords under direct vision,  positive ETCO2 and breath sounds checked- equal and bilateral Secured at: 20 cm Tube secured with: Tape Dental Injury: Teeth and Oropharynx as per pre-operative assessment  Difficulty Due To: Difficult Airway- due to anterior larynx, Difficult Airway- due to limited oral opening and Difficult Airway- due to reduced neck mobility Future Recommendations: Recommend- induction with short-acting agent, and alternative techniques readily available

## 2018-09-17 NOTE — Op Note (Signed)
NAME:  Abigail Moreno                ACCOUNT NO.: [id number]      MEDICAL RECORD NO.: [id number]      FACILITY:  [facility]      PHYSICIAN:  Mauri Pole  DATE OF BIRTH:  06-10-1929     DATE OF PROCEDURE:  09/17/2018                                 OPERATIVE REPORT         PREOPERATIVE DIAGNOSIS: Left  hip osteoarthritis.      POSTOPERATIVE DIAGNOSIS:  Left hip osteoarthritis.      PROCEDURE:  Left total hip replacement through an anterior approach   utilizing DePuy THR system, component size 77mm pinnacle cup, a size 36+4 neutral   Altrex liner, a size 6 standard offset Actis stem with a 36+5 Articuleze metal head  ball.      SURGEON:  Pietro Cassis. Alvan Dame, M.D.      ASSISTANT:  Nehemiah Massed, PA-C     ANESTHESIA:  Spinal.      SPECIMENS:  None.      COMPLICATIONS:  None.      BLOOD LOSS:  350 cc     DRAINS:  None.      INDICATION OF THE PROCEDURE:  Abigail Moreno is a 84 y.o. female who had   presented to office for evaluation of left hip pain.  Radiographs revealed   progressive degenerative changes with bone-on-bone   articulation of the  hip joint, including subchondral cystic changes and osteophytes.  The patient had painful limited range of   motion significantly affecting their overall quality of life and function.  The patient was failing to    respond to conservative measures including medications and/or injections and activity modification and at this point was ready   to proceed with more definitive measures.  Consent was obtained for   benefit of pain relief.  Specific risks of infection, DVT, component   failure, dislocation, neurovascular injury, and need for revision surgery were reviewed in the office as well discussion of   the anterior versus posterior approach were reviewed.     PROCEDURE IN DETAIL:  The patient was brought to operative theater.   Once adequate anesthesia, preoperative antibiotics, 2 gm of Ancef, 1 gm of  Tranexamic Acid, and 10 mg of Decadron were administered, the patient was positioned supine on the [employer] table.  Once the patient was safely positioned with adequate padding of boney prominences we predraped out the hip, and used fluoroscopy to confirm orientation of the pelvis.      The left hip was then prepped and draped from proximal iliac crest to   mid thigh with a shower curtain technique.      Time-out was performed identifying the patient, planned procedure, and the appropriate extremity.     An incision was then made 2 cm lateral to the   anterior superior iliac spine extending over the orientation of the   tensor fascia lata muscle and sharp dissection was carried down to the   fascia of the muscle.      The fascia was then incised.  The muscle belly was identified and swept   laterally and retractor placed along the superior neck.  Following   cauterization of the circumflex vessels and removing some  pericapsular   fat, a second cobra retractor was placed on the inferior neck.  A T-capsulotomy was made along the line of the   superior neck to the trochanteric fossa, then extended proximally and   distally.  Tag sutures were placed and the retractors were then placed   intracapsular.  We then identified the trochanteric fossa and   orientation of my neck cut and then made a neck osteotomy with the femur on traction.  The femoral   head was removed without difficulty or complication.  Traction was let   off and retractors were placed posterior and anterior around the   acetabulum.      The labrum and foveal tissue were debrided.  I began reaming with a 46 mm   reamer and reamed up to 51 mm reamer with good bony bed preparation and a 52 mm  cup was chosen.  The final 52 mm Pinnacle cup was then impacted under fluoroscopy to confirm the depth of penetration and orientation with respect to   Abduction and forward flexion.  A screw was placed into the ilium followed by the hole  eliminator.  The final   36+4 neutral Altrex liner was impacted with good visualized rim fit.  The cup was positioned anatomically within the acetabular portion of the pelvis.      At this point, the femur was rolled to 100 degrees.  Further capsule was   released off the inferior aspect of the femoral neck.  I then   released the superior capsule proximally.  With the leg in a neutral position the hook was placed laterally   along the femur under the vastus lateralis origin and elevated manually and then held in position using the hook attachment on the bed.  The leg was then extended and adducted with the leg rolled to 100   degrees of external rotation.  Retractors were placed along the medial calcar and posteriorly over the greater trochanter.  Once the proximal femur was fully   exposed, I used a box osteotome to set orientation.  I then began   broaching with the starting chili pepper broach and passed this by hand and then broached up to 6.  With the 6 broach in place I chose a standard offset neck and did several trial reductions.  The offset was appropriate, leg lengths   appeared to be equal best matched the other hip previously replaced with the +5 head ball trial confirmed radiographically.   Given these findings, I went ahead and dislocated the hip, repositioned all   retractors and positioned the right hip in the extended and abducted position.  The final 6 standard offset Actis femoral stem was   chosen and it was impacted down to the level of neck cut.  Based on this   and the trial reductions, a final 36+5 Articuleze metal ball was chosen and   impacted onto a clean and dry trunnion, and the hip was reduced.  The   hip had been irrigated throughout the case again at this point.  I did   reapproximate the superior capsular leaflet to the anterior leaflet   using #1 Vicryl.  The fascia of the   tensor fascia lata muscle was then reapproximated using #1 Vicryl and #0 Stratafix  sutures.  The   remaining wound was closed with 2-0 Vicryl and running 4-0 Monocryl.   The hip was cleaned, dried, and dressed sterilely using Dermabond and   Aquacel dressing.  The  patient was then brought   to recovery room in stable condition tolerating the procedure well.    Nehemiah Massed, PA-C was present for the entirety of the case involved from   preoperative positioning, perioperative retractor management, general   facilitation of the case, as well as primary wound closure as assistant.            Pietro Cassis Alvan Dame, M.D.        09/17/2018 8:53 AM

## 2018-09-17 NOTE — Transfer of Care (Signed)
Immediate Anesthesia Transfer of Care Note  Patient: Abigail Moreno  Procedure(s) Performed: TOTAL HIP ARTHROPLASTY ANTERIOR APPROACH (Left Hip)  Patient Location: PACU  Anesthesia Type:General  Level of Consciousness: awake, alert  and oriented  Airway & Oxygen Therapy: Patient Spontanous Breathing and Patient connected to face mask oxygen  Post-op Assessment: Report given to RN and Post -op Vital signs reviewed and stable  Post vital signs: Reviewed and stable  Last Vitals:  Vitals Value Taken Time  BP 132/71 09/17/2018 10:15 AM  Temp    Pulse 73 09/17/2018 10:15 AM  Resp 17 09/17/2018 10:15 AM  SpO2 100 % 09/17/2018 10:15 AM  Vitals shown include unvalidated device data.  Last Pain:  Vitals:   09/17/18 0711  TempSrc:   PainSc: 0-No pain      Patients Stated Pain Goal: 3 (08/81/10 3159)  Complications: No apparent anesthesia complications

## 2018-09-17 NOTE — Anesthesia Postprocedure Evaluation (Signed)
Anesthesia Post Note  Patient: Abigail Moreno  Procedure(s) Performed: TOTAL HIP ARTHROPLASTY ANTERIOR APPROACH (Left Hip)     Patient location during evaluation: PACU Anesthesia Type: General Level of consciousness: awake and alert Pain management: pain level controlled Vital Signs Assessment: post-procedure vital signs reviewed and stable Respiratory status: spontaneous breathing, nonlabored ventilation, respiratory function stable and patient connected to nasal cannula oxygen Cardiovascular status: blood pressure returned to baseline and stable Postop Assessment: no apparent nausea or vomiting Anesthetic complications: no    Last Vitals:  Vitals:   09/17/18 1115 09/17/18 1155  BP: 105/65 (!) 95/59  Pulse: 62 73  Resp: 14 14  Temp: (!) 36.1 C 36.4 C  SpO2: 98% 95%    Last Pain:  Vitals:   09/17/18 1157  TempSrc:   PainSc: [place]

## 2018-09-17 NOTE — Interval H&P Note (Signed)
History and Physical Interval Note:  09/17/2018 7:01 AM  Abigail Moreno  has presented today for surgery, with the diagnosis of Left hip osteoarthritis  The various methods of treatment have been discussed with the patient and family. After consideration of risks, benefits and other options for treatment, the patient has consented to  Procedure(s) with comments: TOTAL HIP ARTHROPLASTY ANTERIOR APPROACH (Left) - 70 mins as a surgical intervention .  The patient's history has been reviewed, patient examined, no change in status, stable for surgery.  I have reviewed the patient's chart and labs.  Questions were answered to the patient's satisfaction.     Mauri Pole

## 2018-09-17 NOTE — Evaluation (Signed)
Physical Therapy Evaluation Patient Details Name: Abigail Moreno MRN: [id number] DOB: 1929/09/06 Today's Date: 09/17/2018   History of Present Illness  Pt is an 84 year old female s/p L DA THA with hx of previous R THA, breast cancer, CLL  Clinical Impression  Pt is s/p THA resulting in the deficits listed below (see PT Problem List).  Pt assisted with very short distance ambulation POD #0 which was limited by drop in BP (RN into room).  Pt plans to d/c home with assist from her daughter. Pt will benefit from skilled PT to increase their independence and safety with mobility to allow discharge to the venue listed below.      Follow Up Recommendations Follow surgeon's recommendation for DC plan and follow-up therapies    Equipment Recommendations  Rolling walker with 5" wheels    Recommendations for Other Services       Precautions / Restrictions Precautions Precautions: None;Fall Restrictions Other Position/Activity Restrictions: WBAT      Mobility  Bed Mobility Overal bed mobility: Needs Assistance Bed Mobility: Supine to Sit     Supine to sit: Min assist;HOB elevated     General bed mobility comments: assist for trunk upright, increased time and effort  Transfers Overall transfer level: Needs assistance Equipment used: Rolling walker (2 wheeled) Transfers: Sit to/from Stand Sit to Stand: Min assist         General transfer comment: verbal cues for UE and LE positioning, assist to rise and steady, increased assist for descent due to dizziness/fatigue  Ambulation/Gait Ambulation/Gait assistance: Min assist;+2 safety/equipment Gait Distance (Feet): 8 Feet Assistive device: Rolling walker (2 wheeled) Gait Pattern/deviations: Step-to pattern;Decreased step length - left;Antalgic     General Gait Details: verbal cues for sequence, RW positioning, posture, pt reported dizziness which become worse so recliner pulled to pt, BP 71/53 mmHg (RN called to room and  assessed pt)  Stairs            Wheelchair Mobility    Modified Rankin (Stroke Patients Only)       Balance                                             Pertinent Vitals/Pain Pain Assessment: 0-10 Pain Score: 4  Pain Location: L hip Pain Descriptors / Indicators: Aching;Sore Pain Intervention(s): Limited activity within patient's tolerance;Monitored during session;Ice applied;Repositioned    Home Living Family/patient expects to be discharged to:: Private residence Living Arrangements: Alone Available Help at Discharge: Family;Available 24 hours/day(daughter) Type of Home: House Home Access: Stairs to enter Entrance Stairs-Rails: None Entrance Stairs-Number of Steps: 2 and 1 Home Layout: One level Home Equipment: Walker - 2 wheels      Prior Function Level of Independence: Independent               Hand Dominance        Extremity/Trunk Assessment        Lower Extremity Assessment Lower Extremity Assessment: LLE deficits/detail LLE Deficits / Details: anticipated post op hip weakness, able to perform ankle pumps       Communication   Communication: HOH  Cognition Arousal/Alertness: Awake/alert Behavior During Therapy: WFL for tasks assessed/performed Overall Cognitive Status: Within Functional Limits for tasks assessed  General Comments      Exercises     Assessment/Plan    PT Assessment Patient needs continued PT services  PT Problem List Decreased strength;Decreased mobility;Decreased knowledge of use of DME;Pain;Decreased knowledge of precautions       PT Treatment Interventions Stair training;Gait training;Therapeutic activities;DME instruction;Functional mobility training;Balance training;Patient/family education;Therapeutic exercise    PT Goals (Current goals can be found in the Care Plan section)  Acute Rehab PT Goals PT Goal Formulation: With  patient Time For Goal Achievement: 09/21/18 Potential to Achieve Goals: Good    Frequency 7X/week   Barriers to discharge        Co-evaluation               AM-PAC PT "6 Clicks" Mobility  Outcome Measure Help needed turning from your back to your side while in a flat bed without using bedrails?: A Little Help needed moving from lying on your back to sitting on the side of a flat bed without using bedrails?: A Little Help needed moving to and from a bed to a chair (including a wheelchair)?: A Little Help needed standing up from a chair using your arms (e.g., wheelchair or bedside chair)?: A Little Help needed to walk in hospital room?: A Little Help needed climbing 3-5 steps with a railing? : A Lot 6 Click Score: 17    End of Session Equipment Utilized During Treatment: Gait belt Activity Tolerance: Patient limited by fatigue(limited by BP) Patient left: in chair;with call bell/phone within reach;with chair alarm set;with nursing/sitter in room;with family/visitor present Nurse Communication: Mobility status PT Visit Diagnosis: Difficulty in walking, not elsewhere classified (R26.2)    Time: 4097-3532 PT Time Calculation (min) (ACUTE ONLY): 21 min   Charges:   PT Evaluation $PT Eval Low Complexity: [facility], PT, DPT Acute Rehabilitation Services Office: [phone] Pager: [phone]  Trena Platt 09/17/2018, 4:48 PM

## 2018-09-17 NOTE — Discharge Instructions (Addendum)

## 2018-09-18 DIAGNOSIS — E663 Overweight: Secondary | ICD-10-CM | POA: Diagnosis present

## 2018-09-18 LAB — BASIC METABOLIC PANEL
Anion gap: 7 (ref 5–15)
BUN: 11 mg/dL (ref 8–23)
CO2: 26 mmol/L (ref 22–32)
Calcium: 8.1 mg/dL — ABNORMAL LOW (ref 8.9–10.3)
Chloride: 106 mmol/L (ref 98–111)
Creatinine, Ser: 0.46 mg/dL (ref 0.44–1.00)
GFR calc non Af Amer: >60 mL/min (ref >60)
Glucose, Bld: 123 mg/dL — ABNORMAL HIGH (ref 70–99)
Potassium: 3.9 mmol/L (ref 3.5–5.1)
Sodium: 139 mmol/L (ref 135–145)

## 2018-09-18 LAB — CBC
HCT: 36.8 % (ref 36.0–46.0)
Hemoglobin: 11.2 g/dL — ABNORMAL LOW (ref 12.0–15.0)
MCH: 29.8 pg (ref 26.0–34.0)
MCHC: 30.4 g/dL (ref 30.0–36.0)
MCV: 97.9 fL (ref 80.0–100.0)
Platelets: 96 10*3/uL — ABNORMAL LOW (ref 150–400)
RBC: 3.76 MIL/uL — ABNORMAL LOW (ref 3.87–5.11)
RDW: 13.8 % (ref 11.5–15.5)
WBC: 37.2 10*3/uL — AB (ref 4.0–10.5)
nRBC: 0 % (ref 0.0–0.2)

## 2018-09-18 NOTE — Progress Notes (Signed)
Physical Therapy Treatment Patient Details Name: Abigail Moreno MRN: [id number] DOB: 1929-06-14 Today's Date: 09/18/2018    History of Present Illness Pt is an 85 year old female s/p L DA THA with hx of previous R THA, breast cancer, CLL    PT Comments    Pt ambulated again in hallway and practiced one step.  Pt practiced step using rollator as this is device she has at home.  Daughter present and educated and assisted with performance.  Both pt and daughter report understanding.  Pt able to verbally recall exercises (HEP handout provided this morning).  Pt feels ready for d/c home today.    Follow Up Recommendations  Follow surgeon's recommendation for DC plan and follow-up therapies     Equipment Recommendations  Rolling walker with 5" wheels    Recommendations for Other Services       Precautions / Restrictions Precautions Precautions: None;Fall Restrictions Other Position/Activity Restrictions: WBAT    Mobility  Bed Mobility Overal bed mobility: Needs Assistance             General bed mobility comments: pt up in recliner on arrival  Transfers Overall transfer level: Needs assistance Equipment used: Rolling walker (2 wheeled) Transfers: Sit to/from Stand Sit to Stand: Min guard         General transfer comment: verbal cues for UE and LE positioning  Ambulation/Gait Ambulation/Gait assistance: Min guard Gait Distance (Feet): 100 Feet Assistive device: Rolling walker (2 wheeled) Gait Pattern/deviations: Step-to pattern;Antalgic;Decreased stance time - left;Step-through pattern     General Gait Details: verbal cues for sequence, RW positioning, posture; pt used to rollator at home; improved to step through pattern   Stairs Stairs: Yes Stairs assistance: Min guard;Min assist Stair Management: Step to pattern;With walker;Forwards Number of Stairs: 1 General stair comments: used rollator and discussed safety, sequencing (pt does not have RW at  home), positioning and brakes for performing one step, daughter present and assisted, dtr and pt aware to use gait belt for safety as pt a little unsteady when rollator needs to be moved, performed twice   Wheelchair Mobility    Modified Rankin (Stroke Patients Only)       Balance                                            Cognition Arousal/Alertness: Awake/alert Behavior During Therapy: WFL for tasks assessed/performed Overall Cognitive Status: Within Functional Limits for tasks assessed                                           General Comments        Pertinent Vitals/Pain Pain Assessment: 0-10 Pain Score: 5  Pain Location: L hip Pain Descriptors / Indicators: Aching;Sore Pain Intervention(s): Limited activity within patient's tolerance;Monitored during session;Repositioned    Home Living                      Prior Function            PT Goals (current goals can now be found in the care plan section) Progress towards PT goals: Progressing toward goals    Frequency    7X/week      PT Plan Current plan remains appropriate    Co-evaluation  AM-PAC PT "6 Clicks" Mobility   Outcome Measure  Help needed turning from your back to your side while in a flat bed without using bedrails?: A Little Help needed moving from lying on your back to sitting on the side of a flat bed without using bedrails?: A Little Help needed moving to and from a bed to a chair (including a wheelchair)?: A Little Help needed standing up from a chair using your arms (e.g., wheelchair or bedside chair)?: A Little Help needed to walk in hospital room?: A Little Help needed climbing 3-5 steps with a railing? : A Little 6 Click Score: 18    End of Session Equipment Utilized During Treatment: Gait belt Activity Tolerance: Patient tolerated treatment well Patient left: in chair;with call bell/phone within reach;with  family/visitor present   PT Visit Diagnosis: Difficulty in walking, not elsewhere classified (R26.2)     Time: 1829-9371 PT Time Calculation (min) (ACUTE ONLY): 19 min  Charges:  $Gait Training: 8-22 mins                    Carmelia Bake, PT, [place] Office: [phone] Pager: [place] E 09/18/2018, 3:10 PM

## 2018-09-18 NOTE — Progress Notes (Signed)
     Subjective: 1 Day Post-Op Procedure(s) (LRB): TOTAL HIP ARTHROPLASTY ANTERIOR APPROACH (Left)   Patient reports pain as mild, pain well controlled.  No events throughout the night, other than not sleeping well. States that yesterday she did have an incident of lower blood pressure, but feels better today.  Looking forward to progressing.  Ready to be discharged home if she does well with PT.    Objective:   VITALS:   Vitals:   09/18/18 0158 09/18/18 0549  BP: 117/70 125/72  Pulse: 69 75  Resp: 17 17  Temp: 98.2 F (36.8 C) 97.8 F (36.6 C)  SpO2: 98% 98%    Dorsiflexion/Plantar flexion intact Incision: dressing C/D/I No cellulitis present Compartment soft  LABS Recent Labs    09/18/18 0423  HGB 11.2*  HCT 36.8  WBC 37.2*  PLT 96*    Recent Labs    09/18/18 0423  NA 139  K 3.9  BUN 11  CREATININE 0.46  GLUCOSE 123*     Assessment/Plan: 1 Day Post-Op Procedure(s) (LRB): TOTAL HIP ARTHROPLASTY ANTERIOR APPROACH (Left) Foley cath d/c'ed Advance diet Up with therapy D/C IV fluids Discharge home Follow up in 2 weeks at [facility] ([facility]). Follow up with OLIN,Kimbery Harwood D in 2 weeks.  Contact information:  [facility]) [address], [place] [phone]    Overweight (BMI 25-29.9) Estimated body mass index is 25.39 kg/m as calculated from the following:   Height as of this encounter: 5' (1.524 m).   Weight as of this encounter: 59 kg. Patient also counseled that weight may inhibit the healing process Patient counseled that losing weight will help with future health issues      West Pugh. Delrae Hagey   PAC  09/18/2018, 7:29 AM

## 2018-09-18 NOTE — Progress Notes (Signed)
Physical Therapy Treatment Patient Details Name: Abigail Moreno MRN: [id number] DOB: 13-Dec-1928 Today's Date: 09/18/2018    History of Present Illness Pt is an 85 year old female s/p L DA THA with hx of previous R THA, breast cancer, CLL    PT Comments    Pt ambulated in hallway improved distance and no reports of dizziness.  Pt performed exercises and provided with handout.  Pt fatigued after exercises.  Pt educated to perform standing exercises at countertop for support and have daughter standing by for safety.  Will return to practice steps prior to d/c.    Follow Up Recommendations  Follow surgeon's recommendation for DC plan and follow-up therapies     Equipment Recommendations  Rolling walker with 5" wheels    Recommendations for Other Services       Precautions / Restrictions Precautions Precautions: None;Fall Restrictions Weight Bearing Restrictions: No Other Position/Activity Restrictions: WBAT    Mobility  Bed Mobility               General bed mobility comments: pt up in recliner on arrival  Transfers Overall transfer level: Needs assistance Equipment used: Rolling walker (2 wheeled) Transfers: Sit to/from Stand Sit to Stand: Min guard         General transfer comment: verbal cues for UE and LE positioning  Ambulation/Gait Ambulation/Gait assistance: Min guard Gait Distance (Feet): 90 Feet Assistive device: Rolling walker (2 wheeled) Gait Pattern/deviations: Step-to pattern;Antalgic;Decreased stance time - left     General Gait Details: verbal cues for sequence, RW positioning, posture; pt used to rollator at home; no dizziness reported today   Stairs             Wheelchair Mobility    Modified Rankin (Stroke Patients Only)       Balance                                            Cognition Arousal/Alertness: Awake/alert Behavior During Therapy: WFL for tasks assessed/performed Overall Cognitive  Status: Within Functional Limits for tasks assessed                                        Exercises Total Joint Exercises Ankle Circles/Pumps: AROM;10 reps;Both;Supine Hip ABduction/ADduction: AROM;10 reps;Standing;Left Long Arc Quad: AROM;10 reps;Seated;Left Knee Flexion: AROM;10 reps;Standing;Left Marching in Standing: AROM;10 reps;Standing;Left Standing Hip Extension: Left;AROM;10 reps;Standing    General Comments        Pertinent Vitals/Pain Pain Assessment: 0-10 Pain Score: 5  Pain Location: L hip Pain Descriptors / Indicators: Aching;Sore Pain Intervention(s): Premedicated before session;Limited activity within patient's tolerance;Monitored during session;Repositioned;Ice applied    Home Living                      Prior Function            PT Goals (current goals can now be found in the care plan section) Progress towards PT goals: Progressing toward goals    Frequency    7X/week      PT Plan Current plan remains appropriate    Co-evaluation              AM-PAC PT "6 Clicks" Mobility   Outcome Measure  Help needed turning from your back to your side while  in a flat bed without using bedrails?: A Little Help needed moving from lying on your back to sitting on the side of a flat bed without using bedrails?: A Little Help needed moving to and from a bed to a chair (including a wheelchair)?: A Little Help needed standing up from a chair using your arms (e.g., wheelchair or bedside chair)?: A Little Help needed to walk in hospital room?: A Little Help needed climbing 3-5 steps with a railing? : A Lot 6 Click Score: 17    End of Session Equipment Utilized During Treatment: Gait belt Activity Tolerance: Patient tolerated treatment well Patient left: in chair;with call bell/phone within reach;with family/visitor present   PT Visit Diagnosis: Difficulty in walking, not elsewhere classified (R26.2)     Time: 8003-4917 PT  Time Calculation (min) (ACUTE ONLY): 23 min  Charges:  $Gait Training: 8-22 mins $Therapeutic Exercise: 8-22 mins                     Carmelia Bake, PT, DPT Acute Rehabilitation Services Office: [phone] Pager: [place] E 09/18/2018, 12:55 PM

## 2018-09-18 NOTE — Plan of Care (Signed)
Pt is stable. No acute distress. Pain management in progress, effective. Plan of care reviewed.

## 2018-09-19 ENCOUNTER — Encounter (HOSPITAL_COMMUNITY): Payer: Self-pay | Admitting: Orthopedic Surgery

## 2018-09-23 NOTE — Discharge Summary (Signed)
Physician Discharge Summary  Patient ID: Abigail Moreno MRN: [id number] DOB/AGE: [birth year] 85 y.o.  Admit date: 09/17/2018 Discharge date: 09/18/2018   Procedures:  Procedure(s) (LRB): TOTAL HIP ARTHROPLASTY ANTERIOR APPROACH (Left)  Attending Physician:  Dr. Paralee Cancel   Admission Diagnoses:   Left hip primary OA / pain  Discharge Diagnoses:  Principal Problem:   S/P left THA, AA Active Problems:   Overweight (BMI 25.0-29.9)  Past Medical History:  Diagnosis Date  . Anxiety   . Arthritis of knee  FALL 2012   RIGHT KNEE, hips, and fingers  . Breast cancer ([place]) FEBRUARY 2004   T1, NO, ER/PR POSITIVE LEFT BREAST  . CLL (chronic lymphocytic leukemia) ([place]) 2004   Dr. Marko Plume is oncologist  . Dyspnea    occ sob with exertion  . History of blood transfusion    with first and maybe second delivery  . Hyperlipemia   . Hypertension   . Macular degeneration    both eyes  . Osteoporosis    borderline  . UTI (urinary tract infection)    on nitrofuratonin bid x 7 days started 09-12-18 am    HPI:    Abigail Moreno, 85 y.o. female, has a history of pain and functional disability in the left hip(s) due to arthritis and patient has failed non-surgical conservative treatments for greater than 12 weeks to include NSAID's and/or analgesics, use of assistive devices and activity modification.  Onset of symptoms was gradual starting 2+ years ago with gradually worsening course since that time.The patient noted prior procedures of the hip to include arthroplasty on the right hip(s).  Patient currently rates pain in the left hip at 9 out of 10 with activity. Patient has night pain, worsening of pain with activity and weight bearing, trendelenberg gait, pain that interfers with activities of daily living and pain with passive range of motion. Patient has evidence of periarticular osteophytes and joint space narrowing by imaging studies. This condition presents safety issues  increasing the risk of falls.  There is no current active infection.  Risks, benefits and expectations were discussed with the patient.  Risks including but not limited to the risk of anesthesia, blood clots, nerve damage, blood vessel damage, failure of the prosthesis, infection and up to and including death.  Patient understand the risks, benefits and expectations and wishes to proceed with surgery.   PCP: Mayra Neer, MD   Discharged Condition: good  Hospital Course:  Patient underwent the above stated procedure on 09/17/2018. Patient tolerated the procedure well and brought to the recovery room in good condition and subsequently to the floor.  POD #1 BP: 125/72 ; Pulse: 75 ; Temp: 97.8 F (36.6 C) ; Resp: 17 Patient reports pain as mild, pain well controlled.  No events throughout the night, other than not sleeping well. States that yesterday she did have an incident of lower blood pressure, but feels better today.  Looking forward to progressing.  Ready to be discharged home.  Dorsiflexion/plantar flexion intact, incision: dressing C/D/I, no cellulitis present and compartment soft.   LABS  Basename    HGB     11.2  HCT     36.8    Discharge Exam: General appearance: alert, cooperative and no distress Extremities: Homans sign is negative, no sign of DVT, no edema, redness or tenderness in the calves or thighs and no ulcers, gangrene or trophic changes  Disposition:  Home with follow up in 2 weeks   Follow-up Information  Paralee Cancel, MD. Schedule an appointment as soon as possible for a visit in 2 weeks.   Specialty:  Orthopedic Surgery Contact information: [address] [place] [address] [phone]           Discharge Instructions    Call MD / Call 911   Complete by:  As directed    If you experience chest pain or shortness of breath, CALL 911 and be transported to the hospital emergency room.  If you develope a fever above 101 F, pus  (white drainage) or increased drainage or redness at the wound, or calf pain, call your surgeon's office.   Change dressing   Complete by:  As directed    Maintain surgical dressing until follow up in the clinic. If the edges start to pull up, may reinforce with tape. If the dressing is no longer working, may remove and cover with gauze and tape, but must keep the area dry and clean.  Call with any questions or concerns.   Constipation Prevention   Complete by:  As directed    Drink plenty of fluids.  Prune juice may be helpful.  You may use a stool softener, such as Colace (over the counter) 100 mg twice a day.  Use MiraLax (over the counter) for constipation as needed.   Diet - low sodium heart healthy   Complete by:  As directed    Discharge instructions   Complete by:  As directed    Maintain surgical dressing until follow up in the clinic. If the edges start to pull up, may reinforce with tape. If the dressing is no longer working, may remove and cover with gauze and tape, but must keep the area dry and clean.  Follow up in 2 weeks at [facility]. Call with any questions or concerns.   Increase activity slowly as tolerated   Complete by:  As directed    Weight bearing as tolerated with assist device (walker, cane, etc) as directed, use it as long as suggested by your surgeon or therapist, typically at least 4-6 weeks.   TED hose   Complete by:  As directed    Use stockings (TED hose) for 2 weeks on both leg(s).  You may remove them at night for sleeping.      Allergies as of 09/18/2018      Reactions   Colesevelam Hcl Other (See Comments)    aches   Ezetimibe Other (See Comments)   aches   Procaine Hcl Swelling   nocaine=swelling   Rosuvastatin Other (See Comments)   aches   Simvastatin Other (See Comments)   aches   Statins Other (See Comments)   aches   Sulfa Antibiotics Other (See Comments)   Bacteria in intestines       Medication List    TAKE these  medications   ALPRAZolam 0.25 MG tablet Commonly known as:  XANAX Take 0.125 mg by mouth at bedtime.   amLODipine-benazepril 5-20 MG capsule Commonly known as:  LOTREL Take 1 capsule by mouth daily as needed (elevated blood pressure).   CALCIUM 500 + D3 PO Take 2 tablets by mouth daily.   Cinnamon 500 MG capsule Take 500 mg by mouth daily.   CRANBERRY PO Take 4,200 mg by mouth daily.   docusate sodium 100 MG capsule Commonly known as:  COLACE Take 1 capsule (100 mg total) by mouth 2 (two) times daily.   ferrous sulfate 325 (65 FE) MG tablet Commonly known as:  FERROUSUL Take 1 tablet (325 mg total) by mouth 3 (three) times daily with meals.   Fish Oil 1000 MG Caps Take 1,000 mg by mouth 2 (two) times daily.   HYDROcodone-acetaminophen 7.5-325 MG tablet Commonly known as:  NORCO Take 1-2 tablets by mouth every 4 (four) hours as needed for moderate pain.   HYDROcodone-acetaminophen 7.5-325 MG tablet Commonly known as:  NORCO Take 1-2 tablets by mouth every 4 (four) hours as needed for moderate pain.   loratadine 10 MG tablet Commonly known as:  CLARITIN Take 10 mg by mouth daily at 12 noon.   methocarbamol 500 MG tablet Commonly known as:  ROBAXIN Take 1 tablet (500 mg total) by mouth every 6 (six) hours as needed for muscle spasms.   multivitamin with minerals Tabs tablet Take 1 tablet by mouth daily. Women's One A Day 50+ Multivitamin   nitrofurantoin 100 MG capsule Commonly known as:  MACRODANTIN Take 100 mg by mouth 2 (two) times daily. X 7 days started am 09-12-18   polyethylene glycol packet Commonly known as:  MIRALAX / GLYCOLAX Take 17 g by mouth 2 (two) times daily.   pramoxine 1 % foam Commonly known as:  PROCTOFOAM Use as directed for hemorrhoids What changed:    how much to take  how to take this  when to take this  reasons to take this   PRESERVISION AREDS 2 PO Take 1 tablet by mouth 2 (two) times daily.   rivaroxaban 10 MG Tabs  tablet Commonly known as:  XARELTO Take 1 tablet (10 mg total) by mouth daily.   sertraline 50 MG tablet Commonly known as:  ZOLOFT Take 25 mg by mouth at bedtime.   tobramycin 0.3 % ophthalmic solution Commonly known as:  TOBREX Place 1 drop into the left eye See admin instructions. Use 4 drop into left eye 4 times daily the day before, the day of, and the day after eye injection            Discharge Care Instructions  (From admission, onward)         Start     Ordered   09/18/18 0000  Change dressing    Comments:  Maintain surgical dressing until follow up in the clinic. If the edges start to pull up, may reinforce with tape. If the dressing is no longer working, may remove and cover with gauze and tape, but must keep the area dry and clean.  Call with any questions or concerns.   09/18/18 0554           Signed: West Pugh. Foch Rosenwald   PA-C  09/23/2018, 7:52 AM

## 2018-10-18 DIAGNOSIS — C9110 Chronic lymphocytic leukemia of B-cell type not having achieved remission: Secondary | ICD-10-CM | POA: Diagnosis not present

## 2018-10-18 DIAGNOSIS — I10 Essential (primary) hypertension: Secondary | ICD-10-CM | POA: Diagnosis not present

## 2018-10-18 DIAGNOSIS — E782 Mixed hyperlipidemia: Secondary | ICD-10-CM | POA: Diagnosis not present

## 2018-10-18 DIAGNOSIS — R300 Dysuria: Secondary | ICD-10-CM | POA: Diagnosis not present

## 2018-10-18 DIAGNOSIS — F4321 Adjustment disorder with depressed mood: Secondary | ICD-10-CM | POA: Diagnosis not present

## 2018-10-25 DIAGNOSIS — H353213 Exudative age-related macular degeneration, right eye, with inactive scar: Secondary | ICD-10-CM | POA: Diagnosis not present

## 2018-10-25 DIAGNOSIS — H31092 Other chorioretinal scars, left eye: Secondary | ICD-10-CM | POA: Diagnosis not present

## 2018-10-25 DIAGNOSIS — H353221 Exudative age-related macular degeneration, left eye, with active choroidal neovascularization: Secondary | ICD-10-CM | POA: Diagnosis not present

## 2018-10-25 DIAGNOSIS — H35423 Microcystoid degeneration of retina, bilateral: Secondary | ICD-10-CM | POA: Diagnosis not present

## 2018-11-01 DIAGNOSIS — Z96642 Presence of left artificial hip joint: Secondary | ICD-10-CM | POA: Diagnosis not present

## 2018-11-01 DIAGNOSIS — Z471 Aftercare following joint replacement surgery: Secondary | ICD-10-CM | POA: Diagnosis not present

## 2018-11-15 ENCOUNTER — Inpatient Hospital Stay: Payer: PPO | Attending: Hematology

## 2018-11-15 ENCOUNTER — Other Ambulatory Visit: Payer: Self-pay | Admitting: *Deleted

## 2018-11-15 DIAGNOSIS — C9110 Chronic lymphocytic leukemia of B-cell type not having achieved remission: Secondary | ICD-10-CM

## 2018-11-15 DIAGNOSIS — Z853 Personal history of malignant neoplasm of breast: Secondary | ICD-10-CM | POA: Insufficient documentation

## 2018-11-15 LAB — CMP (CANCER CENTER ONLY)
ALT: 11 U/L (ref 0–44)
AST: 21 U/L (ref 15–41)
Albumin: 3.9 g/dL (ref 3.5–5.0)
Alkaline Phosphatase: 122 U/L (ref 38–126)
Anion gap: 11 (ref 5–15)
BILIRUBIN TOTAL: 0.6 mg/dL (ref 0.3–1.2)
BUN: 12 mg/dL (ref 8–23)
CO2: 28 mmol/L (ref 22–32)
Calcium: 9.2 mg/dL (ref 8.9–10.3)
Chloride: 105 mmol/L (ref 98–111)
Creatinine: 0.65 mg/dL (ref 0.44–1.00)
GFR, Est AFR Am: >60 mL/min (ref >60)
GFR, Estimated: >60 mL/min (ref >60)
Glucose, Bld: 81 mg/dL (ref 70–99)
Potassium: 3.8 mmol/L (ref 3.5–5.1)
Sodium: 144 mmol/L (ref 135–145)
Total Protein: 6.7 g/dL (ref 6.5–8.1)

## 2018-11-15 LAB — CBC WITH DIFFERENTIAL (CANCER CENTER ONLY)
ABS IMMATURE GRANULOCYTES: 0.04 10*3/uL (ref 0.00–0.07)
Basophils Absolute: 0.1 10*3/uL (ref 0.0–0.1)
Basophils Relative: 0 %
Eosinophils Absolute: 0.1 10*3/uL (ref 0.0–0.5)
Eosinophils Relative: 0 %
HCT: 45.5 % (ref 36.0–46.0)
Hemoglobin: 13.8 g/dL (ref 12.0–15.0)
Immature Granulocytes: 0 %
Lymphocytes Relative: 87 %
Lymphs Abs: 28.1 10*3/uL — ABNORMAL HIGH (ref 0.7–4.0)
MCH: 29.5 pg (ref 26.0–34.0)
MCHC: 30.3 g/dL (ref 30.0–36.0)
MCV: 97.2 fL (ref 80.0–100.0)
MONO ABS: 1.5 10*3/uL — AB (ref 0.1–1.0)
Monocytes Relative: 5 %
Neutro Abs: 2.5 10*3/uL (ref 1.7–7.7)
Neutrophils Relative %: 8 %
Platelet Count: 123 10*3/uL — ABNORMAL LOW (ref 150–400)
RBC: 4.68 MIL/uL (ref 3.87–5.11)
RDW: 13.8 % (ref 11.5–15.5)
WBC Count: 32.3 10*3/uL — ABNORMAL HIGH (ref 4.0–10.5)
nRBC: 0 % (ref 0.0–0.2)

## 2018-11-15 LAB — LACTATE DEHYDROGENASE: LDH: 176 U/L (ref 98–192)

## 2019-01-17 DIAGNOSIS — H353221 Exudative age-related macular degeneration, left eye, with active choroidal neovascularization: Secondary | ICD-10-CM | POA: Diagnosis not present

## 2019-02-11 DIAGNOSIS — Z1231 Encounter for screening mammogram for malignant neoplasm of breast: Secondary | ICD-10-CM | POA: Diagnosis not present

## 2019-02-11 DIAGNOSIS — Z853 Personal history of malignant neoplasm of breast: Secondary | ICD-10-CM | POA: Diagnosis not present

## 2019-04-11 DIAGNOSIS — H35423 Microcystoid degeneration of retina, bilateral: Secondary | ICD-10-CM | POA: Diagnosis not present

## 2019-04-11 DIAGNOSIS — H353213 Exudative age-related macular degeneration, right eye, with inactive scar: Secondary | ICD-10-CM | POA: Diagnosis not present

## 2019-04-11 DIAGNOSIS — H43813 Vitreous degeneration, bilateral: Secondary | ICD-10-CM | POA: Diagnosis not present

## 2019-04-11 DIAGNOSIS — H353221 Exudative age-related macular degeneration, left eye, with active choroidal neovascularization: Secondary | ICD-10-CM | POA: Diagnosis not present

## 2019-04-16 DIAGNOSIS — I10 Essential (primary) hypertension: Secondary | ICD-10-CM | POA: Diagnosis not present

## 2019-04-16 DIAGNOSIS — E782 Mixed hyperlipidemia: Secondary | ICD-10-CM | POA: Diagnosis not present

## 2019-04-18 DIAGNOSIS — F4321 Adjustment disorder with depressed mood: Secondary | ICD-10-CM | POA: Diagnosis not present

## 2019-04-18 DIAGNOSIS — Z7189 Other specified counseling: Secondary | ICD-10-CM | POA: Diagnosis not present

## 2019-04-18 DIAGNOSIS — I10 Essential (primary) hypertension: Secondary | ICD-10-CM | POA: Diagnosis not present

## 2019-04-18 DIAGNOSIS — H3530 Unspecified macular degeneration: Secondary | ICD-10-CM | POA: Diagnosis not present

## 2019-04-18 DIAGNOSIS — Z853 Personal history of malignant neoplasm of breast: Secondary | ICD-10-CM | POA: Diagnosis not present

## 2019-04-18 DIAGNOSIS — E782 Mixed hyperlipidemia: Secondary | ICD-10-CM | POA: Diagnosis not present

## 2019-04-18 DIAGNOSIS — C9110 Chronic lymphocytic leukemia of B-cell type not having achieved remission: Secondary | ICD-10-CM | POA: Diagnosis not present

## 2019-04-18 DIAGNOSIS — D696 Thrombocytopenia, unspecified: Secondary | ICD-10-CM | POA: Diagnosis not present

## 2019-04-18 DIAGNOSIS — Z0000 Encounter for general adult medical examination without abnormal findings: Secondary | ICD-10-CM | POA: Diagnosis not present

## 2019-04-18 DIAGNOSIS — M1990 Unspecified osteoarthritis, unspecified site: Secondary | ICD-10-CM | POA: Diagnosis not present

## 2019-04-18 DIAGNOSIS — M8580 Other specified disorders of bone density and structure, unspecified site: Secondary | ICD-10-CM | POA: Diagnosis not present

## 2019-04-18 DIAGNOSIS — J309 Allergic rhinitis, unspecified: Secondary | ICD-10-CM | POA: Diagnosis not present

## 2019-05-16 ENCOUNTER — Ambulatory Visit: Payer: PPO | Admitting: Hematology

## 2019-05-16 ENCOUNTER — Other Ambulatory Visit: Payer: PPO

## 2019-06-21 DIAGNOSIS — Z23 Encounter for immunization: Secondary | ICD-10-CM | POA: Diagnosis not present

## 2019-06-27 DIAGNOSIS — H353221 Exudative age-related macular degeneration, left eye, with active choroidal neovascularization: Secondary | ICD-10-CM | POA: Diagnosis not present

## 2019-06-27 DIAGNOSIS — H43813 Vitreous degeneration, bilateral: Secondary | ICD-10-CM | POA: Diagnosis not present

## 2019-06-27 DIAGNOSIS — H353213 Exudative age-related macular degeneration, right eye, with inactive scar: Secondary | ICD-10-CM | POA: Diagnosis not present

## 2019-06-27 DIAGNOSIS — H35423 Microcystoid degeneration of retina, bilateral: Secondary | ICD-10-CM | POA: Diagnosis not present

## 2019-07-30 ENCOUNTER — Emergency Department (HOSPITAL_COMMUNITY): Payer: PPO

## 2019-07-30 ENCOUNTER — Inpatient Hospital Stay (HOSPITAL_COMMUNITY): Payer: PPO

## 2019-07-30 ENCOUNTER — Inpatient Hospital Stay (HOSPITAL_COMMUNITY)
Admission: EM | Admit: 2019-07-30 | Discharge: 2019-08-01 | DRG: 065 | Disposition: A | Discharge status: Rehabilitation Facility | Payer: PPO | Attending: Internal Medicine | Admitting: Internal Medicine

## 2019-07-30 ENCOUNTER — Encounter (HOSPITAL_COMMUNITY): Payer: Self-pay | Admitting: Student

## 2019-07-30 ENCOUNTER — Other Ambulatory Visit: Payer: Self-pay

## 2019-07-30 DIAGNOSIS — R52 Pain, unspecified: Secondary | ICD-10-CM | POA: Diagnosis not present

## 2019-07-30 DIAGNOSIS — F411 Generalized anxiety disorder: Secondary | ICD-10-CM | POA: Diagnosis not present

## 2019-07-30 DIAGNOSIS — Z20828 Contact with and (suspected) exposure to other viral communicable diseases: Secondary | ICD-10-CM | POA: Diagnosis present

## 2019-07-30 DIAGNOSIS — H3530 Unspecified macular degeneration: Secondary | ICD-10-CM | POA: Diagnosis not present

## 2019-07-30 DIAGNOSIS — M810 Age-related osteoporosis without current pathological fracture: Secondary | ICD-10-CM | POA: Diagnosis not present

## 2019-07-30 DIAGNOSIS — R062 Wheezing: Secondary | ICD-10-CM | POA: Diagnosis not present

## 2019-07-30 DIAGNOSIS — E8809 Other disorders of plasma-protein metabolism, not elsewhere classified: Secondary | ICD-10-CM | POA: Diagnosis not present

## 2019-07-30 DIAGNOSIS — I6381 Other cerebral infarction due to occlusion or stenosis of small artery: Principal | ICD-10-CM | POA: Diagnosis present

## 2019-07-30 DIAGNOSIS — R0989 Other specified symptoms and signs involving the circulatory and respiratory systems: Secondary | ICD-10-CM | POA: Diagnosis not present

## 2019-07-30 DIAGNOSIS — Z79891 Long term (current) use of opiate analgesic: Secondary | ICD-10-CM

## 2019-07-30 DIAGNOSIS — S0993XA Unspecified injury of face, initial encounter: Secondary | ICD-10-CM | POA: Diagnosis not present

## 2019-07-30 DIAGNOSIS — Z96643 Presence of artificial hip joint, bilateral: Secondary | ICD-10-CM | POA: Diagnosis not present

## 2019-07-30 DIAGNOSIS — Z23 Encounter for immunization: Secondary | ICD-10-CM | POA: Diagnosis not present

## 2019-07-30 DIAGNOSIS — G4700 Insomnia, unspecified: Secondary | ICD-10-CM | POA: Diagnosis not present

## 2019-07-30 DIAGNOSIS — Z90721 Acquired absence of ovaries, unilateral: Secondary | ICD-10-CM | POA: Diagnosis not present

## 2019-07-30 DIAGNOSIS — I7389 Other specified peripheral vascular diseases: Secondary | ICD-10-CM | POA: Diagnosis not present

## 2019-07-30 DIAGNOSIS — R0781 Pleurodynia: Secondary | ICD-10-CM | POA: Diagnosis not present

## 2019-07-30 DIAGNOSIS — S0083XD Contusion of other part of head, subsequent encounter: Secondary | ICD-10-CM | POA: Diagnosis not present

## 2019-07-30 DIAGNOSIS — R05 Cough: Secondary | ICD-10-CM | POA: Diagnosis not present

## 2019-07-30 DIAGNOSIS — R600 Localized edema: Secondary | ICD-10-CM | POA: Diagnosis not present

## 2019-07-30 DIAGNOSIS — G479 Sleep disorder, unspecified: Secondary | ICD-10-CM | POA: Diagnosis not present

## 2019-07-30 DIAGNOSIS — I69354 Hemiplegia and hemiparesis following cerebral infarction affecting left non-dominant side: Secondary | ICD-10-CM | POA: Diagnosis not present

## 2019-07-30 DIAGNOSIS — Z7901 Long term (current) use of anticoagulants: Secondary | ICD-10-CM

## 2019-07-30 DIAGNOSIS — S0083XA Contusion of other part of head, initial encounter: Secondary | ICD-10-CM | POA: Diagnosis present

## 2019-07-30 DIAGNOSIS — I10 Essential (primary) hypertension: Secondary | ICD-10-CM | POA: Diagnosis present

## 2019-07-30 DIAGNOSIS — S199XXA Unspecified injury of neck, initial encounter: Secondary | ICD-10-CM | POA: Diagnosis not present

## 2019-07-30 DIAGNOSIS — Z9012 Acquired absence of left breast and nipple: Secondary | ICD-10-CM | POA: Diagnosis not present

## 2019-07-30 DIAGNOSIS — Z90710 Acquired absence of both cervix and uterus: Secondary | ICD-10-CM | POA: Diagnosis not present

## 2019-07-30 DIAGNOSIS — C9110 Chronic lymphocytic leukemia of B-cell type not having achieved remission: Secondary | ICD-10-CM | POA: Diagnosis not present

## 2019-07-30 DIAGNOSIS — F419 Anxiety disorder, unspecified: Secondary | ICD-10-CM | POA: Diagnosis not present

## 2019-07-30 DIAGNOSIS — I69390 Apraxia following cerebral infarction: Secondary | ICD-10-CM | POA: Diagnosis not present

## 2019-07-30 DIAGNOSIS — R55 Syncope and collapse: Secondary | ICD-10-CM

## 2019-07-30 DIAGNOSIS — Z91048 Other nonmedicinal substance allergy status: Secondary | ICD-10-CM

## 2019-07-30 DIAGNOSIS — Z66 Do not resuscitate: Secondary | ICD-10-CM | POA: Diagnosis present

## 2019-07-30 DIAGNOSIS — I6350 Cerebral infarction due to unspecified occlusion or stenosis of unspecified cerebral artery: Secondary | ICD-10-CM | POA: Diagnosis not present

## 2019-07-30 DIAGNOSIS — R42 Dizziness and giddiness: Secondary | ICD-10-CM | POA: Diagnosis not present

## 2019-07-30 DIAGNOSIS — Z79899 Other long term (current) drug therapy: Secondary | ICD-10-CM | POA: Diagnosis not present

## 2019-07-30 DIAGNOSIS — E785 Hyperlipidemia, unspecified: Secondary | ICD-10-CM | POA: Diagnosis not present

## 2019-07-30 DIAGNOSIS — I63219 Cerebral infarction due to unspecified occlusion or stenosis of unspecified vertebral arteries: Secondary | ICD-10-CM | POA: Diagnosis not present

## 2019-07-30 DIAGNOSIS — R402143 Coma scale, eyes open, spontaneous, at hospital admission: Secondary | ICD-10-CM | POA: Diagnosis not present

## 2019-07-30 DIAGNOSIS — S0033XA Contusion of nose, initial encounter: Secondary | ICD-10-CM | POA: Diagnosis present

## 2019-07-30 DIAGNOSIS — F418 Other specified anxiety disorders: Secondary | ICD-10-CM | POA: Diagnosis not present

## 2019-07-30 DIAGNOSIS — R2990 Unspecified symptoms and signs involving the nervous system: Secondary | ICD-10-CM

## 2019-07-30 DIAGNOSIS — I639 Cerebral infarction, unspecified: Secondary | ICD-10-CM | POA: Diagnosis present

## 2019-07-30 DIAGNOSIS — W01118A Fall on same level from slipping, tripping and stumbling with subsequent striking against other sharp object, initial encounter: Secondary | ICD-10-CM | POA: Diagnosis present

## 2019-07-30 DIAGNOSIS — Z853 Personal history of malignant neoplasm of breast: Secondary | ICD-10-CM | POA: Diagnosis not present

## 2019-07-30 DIAGNOSIS — C50919 Malignant neoplasm of unspecified site of unspecified female breast: Secondary | ICD-10-CM | POA: Diagnosis present

## 2019-07-30 DIAGNOSIS — R531 Weakness: Secondary | ICD-10-CM | POA: Diagnosis not present

## 2019-07-30 DIAGNOSIS — R0902 Hypoxemia: Secondary | ICD-10-CM | POA: Diagnosis not present

## 2019-07-30 DIAGNOSIS — R402363 Coma scale, best motor response, obeys commands, at hospital admission: Secondary | ICD-10-CM | POA: Diagnosis not present

## 2019-07-30 DIAGNOSIS — R29701 NIHSS score 1: Secondary | ICD-10-CM | POA: Diagnosis present

## 2019-07-30 DIAGNOSIS — R079 Chest pain, unspecified: Secondary | ICD-10-CM | POA: Diagnosis not present

## 2019-07-30 DIAGNOSIS — D696 Thrombocytopenia, unspecified: Secondary | ICD-10-CM | POA: Diagnosis not present

## 2019-07-30 DIAGNOSIS — Z87891 Personal history of nicotine dependence: Secondary | ICD-10-CM

## 2019-07-30 DIAGNOSIS — I6389 Other cerebral infarction: Secondary | ICD-10-CM | POA: Diagnosis not present

## 2019-07-30 DIAGNOSIS — Z888 Allergy status to other drugs, medicaments and biological substances status: Secondary | ICD-10-CM

## 2019-07-30 DIAGNOSIS — D62 Acute posthemorrhagic anemia: Secondary | ICD-10-CM | POA: Diagnosis not present

## 2019-07-30 DIAGNOSIS — F329 Major depressive disorder, single episode, unspecified: Secondary | ICD-10-CM | POA: Diagnosis not present

## 2019-07-30 DIAGNOSIS — Z8249 Family history of ischemic heart disease and other diseases of the circulatory system: Secondary | ICD-10-CM

## 2019-07-30 DIAGNOSIS — R252 Cramp and spasm: Secondary | ICD-10-CM | POA: Diagnosis not present

## 2019-07-30 DIAGNOSIS — K5901 Slow transit constipation: Secondary | ICD-10-CM | POA: Diagnosis not present

## 2019-07-30 DIAGNOSIS — R402253 Coma scale, best verbal response, oriented, at hospital admission: Secondary | ICD-10-CM | POA: Diagnosis not present

## 2019-07-30 DIAGNOSIS — I700 Atherosclerosis of aorta: Secondary | ICD-10-CM | POA: Diagnosis present

## 2019-07-30 DIAGNOSIS — Y92009 Unspecified place in unspecified non-institutional (private) residence as the place of occurrence of the external cause: Secondary | ICD-10-CM

## 2019-07-30 DIAGNOSIS — Z882 Allergy status to sulfonamides status: Secondary | ICD-10-CM

## 2019-07-30 DIAGNOSIS — S0990XA Unspecified injury of head, initial encounter: Secondary | ICD-10-CM | POA: Diagnosis not present

## 2019-07-30 DIAGNOSIS — Z856 Personal history of leukemia: Secondary | ICD-10-CM | POA: Diagnosis not present

## 2019-07-30 DIAGNOSIS — M25511 Pain in right shoulder: Secondary | ICD-10-CM | POA: Diagnosis not present

## 2019-07-30 DIAGNOSIS — M62838 Other muscle spasm: Secondary | ICD-10-CM | POA: Diagnosis not present

## 2019-07-30 DIAGNOSIS — L8990 Pressure ulcer of unspecified site, unspecified stage: Secondary | ICD-10-CM | POA: Insufficient documentation

## 2019-07-30 DIAGNOSIS — R609 Edema, unspecified: Secondary | ICD-10-CM | POA: Diagnosis not present

## 2019-07-30 LAB — BASIC METABOLIC PANEL
Anion gap: 10 (ref 5–15)
BUN: 7 mg/dL — ABNORMAL LOW (ref 8–23)
CO2: 24 mmol/L (ref 22–32)
Calcium: 9.2 mg/dL (ref 8.9–10.3)
Chloride: 105 mmol/L (ref 98–111)
Creatinine, Ser: 0.63 mg/dL (ref 0.44–1.00)
GFR calc Af Amer: >60 mL/min (ref >60)
GFR calc non Af Amer: >60 mL/min (ref >60)
Glucose, Bld: 100 mg/dL — ABNORMAL HIGH (ref 70–99)
Potassium: 4.2 mmol/L (ref 3.5–5.1)
Sodium: 139 mmol/L (ref 135–145)

## 2019-07-30 LAB — URINALYSIS, ROUTINE W REFLEX MICROSCOPIC
Bilirubin Urine: NEGATIVE
Glucose, UA: NEGATIVE mg/dL
Hgb urine dipstick: NEGATIVE
Ketones, ur: 20 mg/dL — AB
Nitrite: NEGATIVE
Protein, ur: NEGATIVE mg/dL
Specific Gravity, Urine: 1.013 (ref 1.005–1.030)
pH: 6 (ref 5.0–8.0)

## 2019-07-30 LAB — CBC
HCT: 45.9 % (ref 36.0–46.0)
HCT: 44.8 % (ref 36.0–46.0)
Hemoglobin: 14.7 g/dL (ref 12.0–15.0)
Hemoglobin: 14.6 g/dL (ref 12.0–15.0)
MCH: 30.1 pg (ref 26.0–34.0)
MCH: 30.5 pg (ref 26.0–34.0)
MCHC: 32 g/dL (ref 30.0–36.0)
MCHC: 32.6 g/dL (ref 30.0–36.0)
MCV: 93.9 fL (ref 80.0–100.0)
MCV: 93.5 fL (ref 80.0–100.0)
Platelets: 106 10*3/uL — ABNORMAL LOW (ref 150–400)
Platelets: 111 10*3/uL — ABNORMAL LOW (ref 150–400)
RBC: 4.89 MIL/uL (ref 3.87–5.11)
RBC: 4.79 MIL/uL (ref 3.87–5.11)
RDW: 13.7 % (ref 11.5–15.5)
RDW: 13.8 % (ref 11.5–15.5)
WBC: 36.9 10*3/uL — ABNORMAL HIGH (ref 4.0–10.5)
WBC: 38.4 10*3/uL — ABNORMAL HIGH (ref 4.0–10.5)
nRBC: 0 % (ref 0.0–0.2)
nRBC: 0 % (ref 0.0–0.2)

## 2019-07-30 LAB — PROTIME-INR
INR: 1.1 (ref 0.8–1.2)
Prothrombin Time: 14 seconds (ref 11.4–15.2)

## 2019-07-30 LAB — I-STAT CHEM 8, ED
BUN: 9 mg/dL (ref 8–23)
Calcium, Ion: 1.11 mmol/L — ABNORMAL LOW (ref 1.15–1.40)
Chloride: 104 mmol/L (ref 98–111)
Creatinine, Ser: 0.5 mg/dL (ref 0.44–1.00)
Glucose, Bld: 101 mg/dL — ABNORMAL HIGH (ref 70–99)
HCT: 45 % (ref 36.0–46.0)
Hemoglobin: 15.3 g/dL — ABNORMAL HIGH (ref 12.0–15.0)
Potassium: 4.4 mmol/L (ref 3.5–5.1)
Sodium: 140 mmol/L (ref 135–145)
TCO2: 25 mmol/L (ref 22–32)

## 2019-07-30 LAB — DIFFERENTIAL
Abs Immature Granulocytes: 0 10*3/uL (ref 0.00–0.07)
Basophils Absolute: 0 10*3/uL (ref 0.0–0.1)
Basophils Relative: 0 %
Eosinophils Absolute: 0 10*3/uL (ref 0.0–0.5)
Eosinophils Relative: 0 %
Lymphocytes Relative: 45 %
Lymphs Abs: 17.3 10*3/uL — ABNORMAL HIGH (ref 0.7–4.0)
Monocytes Absolute: 1.9 10*3/uL — ABNORMAL HIGH (ref 0.1–1.0)
Monocytes Relative: 5 %
Neutro Abs: 19.2 10*3/uL — ABNORMAL HIGH (ref 1.7–7.7)
Neutrophils Relative %: 50 %

## 2019-07-30 LAB — RAPID URINE DRUG SCREEN, HOSP PERFORMED
Amphetamines: NOT DETECTED
Barbiturates: NOT DETECTED
Benzodiazepines: POSITIVE — AB
Cocaine: NOT DETECTED
Opiates: NOT DETECTED
Tetrahydrocannabinol: NOT DETECTED

## 2019-07-30 LAB — APTT: aPTT: 30 seconds (ref 24–36)

## 2019-07-30 LAB — ETHANOL: Alcohol, Ethyl (B): <10 mg/dL (ref <10)

## 2019-07-30 MED ORDER — LORAZEPAM 2 MG/ML IJ SOLN
0.5000 mg | Freq: Once | INTRAMUSCULAR | Status: AC | PRN
  Administered 2019-07-30: 0.5 mg via INTRAVENOUS
  Filled 2019-07-30: qty 1

## 2019-07-30 MED ORDER — TETANUS-DIPHTH-ACELL PERTUSSIS 5-2.5-18.5 LF-MCG/0.5 IM SUSP
0.5000 mL | Freq: Once | INTRAMUSCULAR | Status: AC
  Administered 2019-07-30: 0.5 mL via INTRAMUSCULAR
  Filled 2019-07-30: qty 0.5

## 2019-07-30 MED ORDER — ENOXAPARIN SODIUM 40 MG/0.4ML ~~LOC~~ SOLN
40.0000 mg | SUBCUTANEOUS | Status: DC
  Administered 2019-07-31 – 2019-08-01 (×2): 40 mg via SUBCUTANEOUS
  Filled 2019-07-30 (×2): qty 0.4

## 2019-07-30 MED ORDER — ASPIRIN 325 MG PO TABS
325.0000 mg | ORAL_TABLET | Freq: Every day | ORAL | Status: DC
  Administered 2019-07-31: 325 mg via ORAL
  Filled 2019-07-30: qty 1

## 2019-07-30 MED ORDER — ASPIRIN 300 MG RE SUPP: 300.0000 mg | Freq: Every day | RECTAL | Status: DC

## 2019-07-30 MED ORDER — IOHEXOL 350 MG/ML SOLN
75.0000 mL | Freq: Once | INTRAVENOUS | Status: AC | PRN
  Administered 2019-07-30: 75 mL via INTRAVENOUS

## 2019-07-30 MED ORDER — SODIUM CHLORIDE 0.9 % IV SOLN
INTRAVENOUS | Status: AC
  Administered 2019-07-31 (×2): via INTRAVENOUS

## 2019-07-30 MED ORDER — ALPRAZOLAM 0.25 MG PO TABS
0.2500 mg | ORAL_TABLET | Freq: Four times a day (QID) | ORAL | Status: DC | PRN
  Administered 2019-07-31: 0.25 mg via ORAL
  Filled 2019-07-30: qty 1

## 2019-07-30 MED ORDER — ASPIRIN 300 MG RE SUPP
300.0000 mg | Freq: Every day | RECTAL | Status: DC
  Administered 2019-07-30: 300 mg via RECTAL
  Filled 2019-07-30: qty 1

## 2019-07-30 MED ORDER — ACETAMINOPHEN 650 MG RE SUPP: 650.0000 mg | RECTAL | Status: DC | PRN

## 2019-07-30 MED ORDER — ACETAMINOPHEN 160 MG/5ML PO SOLN: 650.0000 mg | ORAL | Status: DC | PRN

## 2019-07-30 MED ORDER — STROKE: EARLY STAGES OF RECOVERY BOOK
Freq: Once | Status: AC
  Administered 2019-07-31: 1
  Filled 2019-07-30: qty 1

## 2019-07-30 MED ORDER — ACETAMINOPHEN 325 MG PO TABS
650.0000 mg | ORAL_TABLET | ORAL | Status: DC | PRN
  Administered 2019-08-01: 650 mg via ORAL
  Filled 2019-07-30: qty 2

## 2019-07-30 NOTE — ED Provider Notes (Signed)
[facility] EMERGENCY DEPARTMENT Provider Note   CSN: [id number] Arrival date & time: 07/30/19  1001     History   Chief Complaint Chief Complaint  Patient presents with   Loss of Consciousness    HPI Abigail Moreno is a 85 y.o. female with a hx of chronic lymphocytic leukemia (no active treatment), thrombocytopenia, hypertension, hyperlipidemia, anxiety, & breast cancer s/p partial mastectomy/lumpectomy who presents to the ED via EMS for evaluation of L sided weakness since yesterday AM & syncope around 6:30 this AM. Patient states she woke up yesterday morning with LUE/LLE weakness leading to difficulty with ambulation. Her daughter took her to a dentist appointment yesterday where she noted her gait to be somewhat abnormal, her daughter tried to get her to come to the ED then, but patient did not want to and seemed to be mentating & speaking appropriately at that time. This AM the patient woke up feeling generally not well with continued LUE/LLE weakness. She had a glass of water and when she transitioned from sitting to standing & began ambulating to get a refill she felt lightheaded/faint & "blacked out." She struck her face on the corner of the fire place as a result of this sustaining a wound to the bridge of her nose & bruising to the forehead. She believes syncope episode was brief, she was ultimately unable to get up on her own & called her daughter for help. They assisted her and called 911. She is having some L sided rib pain s/p fall, no other major complaints of pain. Denies headache, visual disturbance, dizziness like the room spinning, speech difficulty, numbness, vomiting, chest pain, dyspnea, fever, chills, URI sxs, or abdominal pain. She had a similar syncope episode in June of this year but did not seek medical care @ that time. Last tetanus was in 2013. She is not on anticoagulation. She does not typically walk with a cane/walker. No recent med changes,  was on abx a few weeks ago for a UTI.      HPI  Past Medical History:  Diagnosis Date   Anxiety    Arthritis of knee  FALL 2012   RIGHT KNEE, hips, and fingers   Breast cancer ([place]) FEBRUARY 2004   T1, NO, ER/PR POSITIVE LEFT BREAST   CLL (chronic lymphocytic leukemia) ([place]) 2004   Dr. Marko Plume is oncologist   Dyspnea    occ sob with exertion   History of blood transfusion    with first and maybe second delivery   Hyperlipemia    Hypertension    Macular degeneration    both eyes   Osteoporosis    borderline   UTI (urinary tract infection)    on nitrofuratonin bid x 7 days started 09-12-18 am    Patient Active Problem List   Diagnosis Date Noted   Overweight (BMI 25.0-29.9) 09/18/2018   S/P left THA, AA 09/17/2018   Environmental allergies 07/19/2016   History of left breast cancer 10/23/2015   Status post hysterectomy 06/15/2015   Unresolved grief 06/15/2015   Thrombocytopenia ([place]) 06/15/2015   Need for prophylactic vaccination and inoculation against influenza 06/15/2015   Mixed hyperlipidemia 03/16/2015   Osteopenia 03/16/2015   Osteoarthritis 03/16/2015   Vaginal atrophy 03/16/2015   Papanicolaou smear for cervical cancer screening 03/16/2015   Macular degeneration 01/07/2015   CLL (chronic lymphocytic leukemia) ([place]) 11/18/2011   Breast cancer ([place]) 11/18/2011    Past Surgical History:  Procedure Laterality Date   ABDOMINAL HYSTERECTOMY  [place]( LEFT)   APPENDECTOMY     EYE SURGERY Bilateral    cataracts   INCONTINENCE SURGERY     MASTECTOMY PARTIAL / LUMPECTOMY W/ AXILLARY LYMPHADENECTOMY  FEBRUARY 2004   LEFT BREAST   OTHER SURGICAL HISTORY     cysts removed from breasts    TOTAL HIP ARTHROPLASTY  12/26/2011   Procedure: TOTAL HIP ARTHROPLASTY ANTERIOR APPROACH;  Surgeon: Mauri Pole, MD;  Location: [facility];  Service: Orthopedics;  Laterality: Right;   TOTAL HIP ARTHROPLASTY Left 09/17/2018    Procedure: TOTAL HIP ARTHROPLASTY ANTERIOR APPROACH;  Surgeon: Paralee Cancel, MD;  Location: [facility];  Service: Orthopedics;  Laterality: Left;  70 mins     OB History    Gravida  6   Para  3   Term      Preterm      AB  3   Living  3     SAB  3   TAB      Ectopic      Multiple      Live Births               Home Medications    Prior to Admission medications   Medication Sig Start Date End Date Taking? Authorizing Provider  ALPRAZolam (XANAX) 0.25 MG tablet Take 0.125 mg by mouth at bedtime.  05/20/15   [provider]  amLODipine-benazepril (LOTREL) 5-20 MG capsule Take 1 capsule by mouth daily as needed (elevated blood pressure).    [provider]  Calcium Carb-Cholecalciferol (CALCIUM 500 + D3 PO) Take 2 tablets by mouth daily.    [provider]  Cinnamon 500 MG capsule Take 500 mg by mouth daily.     [provider]  CRANBERRY PO Take 4,200 mg by mouth daily.    [provider]  docusate sodium (COLACE) 100 MG capsule Take 1 capsule (100 mg total) by mouth 2 (two) times daily. 09/17/18   Danae Orleans, PA-C  ferrous sulfate (FERROUSUL) 325 (65 FE) MG tablet Take 1 tablet (325 mg total) by mouth 3 (three) times daily with meals. 09/17/18   Danae Orleans, PA-C  HYDROcodone-acetaminophen (NORCO) 7.5-325 MG tablet Take 1-2 tablets by mouth every 4 (four) hours as needed for moderate pain. 09/17/18   Danae Orleans, PA-C  HYDROcodone-acetaminophen (NORCO) 7.5-325 MG tablet Take 1-2 tablets by mouth every 4 (four) hours as needed for moderate pain. 09/22/18   Danae Orleans, PA-C  loratadine (CLARITIN) 10 MG tablet Take 10 mg by mouth daily at 12 noon.     [provider]  methocarbamol (ROBAXIN) 500 MG tablet Take 1 tablet (500 mg total) by mouth every 6 (six) hours as needed for muscle spasms. 09/17/18   Danae Orleans, PA-C  Multiple Vitamin (MULTIVITAMIN WITH MINERALS) TABS tablet Take 1 tablet by mouth  daily. Women's One A Day 50+ Multivitamin    [provider]  Multiple Vitamins-Minerals (PRESERVISION AREDS 2 PO) Take 1 tablet by mouth 2 (two) times daily.    [provider]  nitrofurantoin (MACRODANTIN) 100 MG capsule Take 100 mg by mouth 2 (two) times daily. X 7 days started am 09-12-18    [provider]  Omega-3 Fatty Acids (FISH OIL) 1000 MG CAPS Take 1,000 mg by mouth 2 (two) times daily.    [provider]  polyethylene glycol (MIRALAX / GLYCOLAX) packet Take 17 g by mouth 2 (two) times daily. 09/17/18   Danae Orleans, PA-C  pramoxine (  PROCTOFOAM) 1 % foam Use as directed for hemorrhoids Patient taking differently: Place 1 application rectally every 12 (twelve) hours as needed (hemorrhoids). Use as directed for hemorrhoids 07/17/16   Livesay, Tamala Julian, MD  rivaroxaban (XARELTO) 10 MG TABS tablet Take 1 tablet (10 mg total) by mouth daily. 09/17/18   Danae Orleans, PA-C  sertraline (ZOLOFT) 50 MG tablet Take 25 mg by mouth at bedtime.    [provider]  tobramycin (TOBREX) 0.3 % ophthalmic solution Place 1 drop into the left eye See admin instructions. Use 4 drop into left eye 4 times daily the day before, the day of, and the day after eye injection 12/18/15   [provider]    Family History Family History  Problem Relation Age of Onset   Pancreatic cancer Maternal Grandmother    Cancer Paternal Grandmother        ? type    Cancer Other        breast-1st paternal cousin-no treatment, liver cancer-2nd maternal cousin   Hypertension Mother    Hypertension Sister        x2   High Cholesterol Sister        x2   Heart attack Father     Social History Social History   Tobacco Use   Smoking status: Former Smoker    Packs/day: 0.25    Years: 2.00    Pack years: 0.50    Types: Cigarettes    Quit date: 09/18/1958    Years since quitting: 60.9   Smokeless tobacco: Never Used  Substance Use Topics   Alcohol use:  No    Alcohol/week: 0.0 standard drinks   Drug use: No     Allergies   Colesevelam hcl, Ezetimibe, Procaine hcl, Rosuvastatin, Simvastatin, Statins, and Sulfa antibiotics   Review of Systems Review of Systems  Constitutional: Negative for chills and fever.  HENT: Negative for congestion, ear pain and sore throat.   Eyes: Negative for visual disturbance.  Respiratory: Negative for cough and shortness of breath.   Cardiovascular: Negative for chest pain.  Gastrointestinal: Negative for abdominal pain, blood in stool and vomiting.  Genitourinary: Negative for dysuria and urgency.  Musculoskeletal:       + for L sided rib pain.   Skin: Positive for wound.  Neurological: Positive for syncope, weakness (LUE/LLE) and light-headedness (prior to syncope, resolved @ present). Negative for dizziness, speech difficulty, numbness and headaches.  All other systems reviewed and are negative.    Physical Exam Updated Vital Signs BP 131/89 (BP Location: Right Arm)    Pulse 88    Temp 97.9 F (36.6 C) (Oral)    Resp 16    SpO2 95%   Physical Exam Vitals signs and nursing note reviewed. Exam conducted with a chaperone present.  Constitutional:      General: She is awake.     Appearance: She is well-developed. She is not toxic-appearing.  HENT:     Head: No raccoon eyes or Battle's sign.     Comments: Hematoma with abrasion to L forehead which is tender to palpation. Small V shaped laceration/abrasion that is fairly superficial to the R aspect of bridge of the nose- no active bleeding or appreciable FB. 1-2 mm deep centrally.     Right Ear: No hemotympanum.     Left Ear: No hemotympanum.     Nose:     Right Nostril: No epistaxis or septal hematoma.     Left Nostril: No epistaxis or septal hematoma.  Mouth/Throat:     Pharynx: Uvula midline.  Eyes:     General: Vision grossly intact.     Extraocular Movements: Extraocular movements intact.     Pupils: Pupils are equal, round, and  reactive to light.  Neck:     Musculoskeletal: Normal range of motion. No neck rigidity or spinous process tenderness.  Cardiovascular:     Rate and Rhythm: Normal rate and regular rhythm.     Heart sounds: Murmur present.  Pulmonary:     Effort: Pulmonary effort is normal.     Breath sounds: Normal breath sounds. No wheezing, rhonchi or rales.  Chest:     Comments: Left posterior lateral chest wall tenderness to palpation. No overlying ecchymosis/abrasions. No palpable crepitus.  Abdominal:     General: There is no distension.     Palpations: Abdomen is soft.     Tenderness: There is no abdominal tenderness. There is no right CVA tenderness, left CVA tenderness, guarding or rebound.     Comments: No overlying ecchymosis/abrasions.   Musculoskeletal:     Comments: Upper extremities: Bruising noted to the dorsum of the left hand (patient states from attempts @ IV access). Intact AROM throughout. Some discomfort in R shoulder which is not new- chronic no acute change. NO point/focal bony tenderness.  Back: No point/focal midline spinal tenderness or palpable step off.  Lower extremities: Intact AROM throughout. No point/focal bony tenderness. No obvious pelvic instability.   Neurological:     Mental Status: She is alert.     Comments: Alert. Clear speech. Question very subtle L sided facial droop with smiling, CN III-XII otherwise grossly intact. Sensation grossly intact to bilateral upper/lower extremities. Very mild decreased strength to LUE/LLE muscle groups compared to RUE/RLE muscle groups. Mild LUE pronator drift & LLE drift when holding off of the stretcher. Mild dysmetria with LUE finger to nose. Difficulty with standing therefore deferred trial of ambulation.    Psychiatric:        Mood and Affect: Mood normal.        Behavior: Behavior normal.    ED Treatments / Results  Labs (all labs ordered are listed, but only abnormal results are displayed) Labs Reviewed  BASIC METABOLIC  PANEL - Abnormal; Notable for the following components:      Result Value   Glucose, Bld 100 (*)    BUN 7 (*)    All other components within normal limits  CBC - Abnormal; Notable for the following components:   WBC 36.9 (*)    Platelets 106 (*)    All other components within normal limits  URINALYSIS, ROUTINE W REFLEX MICROSCOPIC    EKG EKG Interpretation  Date/Time:  Wednesday July 30 2019 17:54:43 EST Ventricular Rate:  88 PR Interval:    QRS Duration: 90 QT Interval:  397 QTC Calculation: 478 R Axis:   33 Text Interpretation: Ectopic atrial rhythm Borderline short PR interval Low voltage, precordial leads No significant change since last tracing Confirmed by Theotis Burrow [id number]) on 07/30/2019 6:13:05 PM   Radiology Ct Head Wo Contrast  Result Date: 07/30/2019 CLINICAL DATA:  Status post fall with a blow to the head. Initial encounter. EXAM: CT HEAD WITHOUT CONTRAST CT MAXILLOFACIAL WITHOUT CONTRAST TECHNIQUE: Multidetector CT imaging of the head and maxillofacial structures were performed using the standard protocol without intravenous contrast. Multiplanar CT image reconstructions of the maxillofacial structures were also generated. COMPARISON:  None. FINDINGS: CT HEAD FINDINGS Brain: No evidence of acute infarction, hemorrhage, hydrocephalus, extra-axial collection  or mass lesion/mass effect. Chronic microvascular ischemic change noted. Vascular: No hyperdense vessel or unexpected calcification. Skull: Intact.  No focal lesion. Other: Soft tissue contusion on the forehead is seen. CT MAXILLOFACIAL FINDINGS Osseous: No fracture or mandibular dislocation. No destructive process. Orbits: Negative. No traumatic or inflammatory finding. Status post cataract surgery. Sinuses: Tiny mucous retention cyst or polyp right maxillary sinus is noted. Trace bilateral mastoid effusions. Soft tissues: Negative. IMPRESSION: Soft tissue contusion on the forehead without underlying fracture. No  other acute abnormality head or face. Chronic microvascular ischemic change. Electronically Signed   By: Inge Rise M.D.   On: 07/30/2019 11:18   Ct Maxillofacial Wo Contrast  Result Date: 07/30/2019 CLINICAL DATA:  Status post fall with a blow to the head. Initial encounter. EXAM: CT HEAD WITHOUT CONTRAST CT MAXILLOFACIAL WITHOUT CONTRAST TECHNIQUE: Multidetector CT imaging of the head and maxillofacial structures were performed using the standard protocol without intravenous contrast. Multiplanar CT image reconstructions of the maxillofacial structures were also generated. COMPARISON:  None. FINDINGS: CT HEAD FINDINGS Brain: No evidence of acute infarction, hemorrhage, hydrocephalus, extra-axial collection or mass lesion/mass effect. Chronic microvascular ischemic change noted. Vascular: No hyperdense vessel or unexpected calcification. Skull: Intact.  No focal lesion. Other: Soft tissue contusion on the forehead is seen. CT MAXILLOFACIAL FINDINGS Osseous: No fracture or mandibular dislocation. No destructive process. Orbits: Negative. No traumatic or inflammatory finding. Status post cataract surgery. Sinuses: Tiny mucous retention cyst or polyp right maxillary sinus is noted. Trace bilateral mastoid effusions. Soft tissues: Negative. IMPRESSION: Soft tissue contusion on the forehead without underlying fracture. No other acute abnormality head or face. Chronic microvascular ischemic change. Electronically Signed   By: Inge Rise M.D.   On: 07/30/2019 11:18    Procedures Procedures (including critical care time)  Medications Ordered in ED Medications  LORazepam (ATIVAN) injection 0.5 mg (has no administration in time range)  Tdap (BOOSTRIX) injection 0.5 mL (0.5 mLs Intramuscular Given 07/30/19 1905)     Initial Impression / Assessment and Plan / ED Course  I have reviewed the triage vital signs and the nursing notes.  Pertinent labs & imaging results that were available during my  care of the patient were reviewed by me and considered in my medical decision making (see chart for details).   Patient presents to the ED with LUE/LLE weakness x 36 hours & syncope episode today.  Patient is nontoxic appearing, resting comfortably, vitals w/ intermittently elevated BP otherwise unremarkable.  Work-up per triage reviewed:  CBC: Leukocytosis & thrombocytopenia similar to prior labs.  BMP: No significant electrolyte derangement. Creatinine WNL.  UA: Not overly consistent w/ UTI but w/ trace leuks & rare bacteria with recent UTI will send for culture CT head/maxillofacial: Soft tissue contusion on the forehead without underlying fracture. No other acute abnormality head or face. Chronic microvascular ischemic change. EKG: No significant arrhythmia/STEMI   In terms of syncope with fall: Forehead hematoma present. Nasal bridge laceration/abrasion- does not appear to require repair w/ sutures/staples/skin adhesives. Tetanus will be updated. CT head w/o head bleed. CT max/face w/o fractures. No midline spinal tenderness to neck/back.  Some L sided rib tenderness without overlying skin changes or palpable crepitus- CXR obtained negative for injury. No abdominal or extremity tenderness. Labs w/o significant abnormality. Do question possible systolic murmur with heart exam. EKG without concerning arrhythmias, on cardiac monitor.   In terms of Neuro exam: Mild LUE/LLE weakness/drift & LUE dysmetria. Questionable very mild facial droop --> concern for ischemic CVA, not  within code stroke window, will place orders using stroke order set & plan to discuss with neurology. Plan for admission for stroke/syncope work-up.   18:28: CONSULT: Discussed with neurologist Dr. Lorraine Lax- recommends MRI brain wo, MRA head wo, & carotid dopplers. In agreement with hospitalist service admission.   19:15: CONSULT: Discussed with hospitalist Dr. Hal Hope who accepts admission.   Findings and plan of care  discussed with supervising physician Dr. Rex Kras who has evaluated patient & is in agreement.   BRIGETT VERDEROSA was evaluated in Emergency Department on 07/30/2019 for the symptoms described in the history of present illness. He/she was evaluated in the context of the global COVID-19 pandemic, which necessitated consideration that the patient might be at risk for infection with the SARS-CoV-2 virus that causes COVID-19. Institutional protocols and algorithms that pertain to the evaluation of patients at risk for COVID-19 are in a state of rapid change based on information released by regulatory bodies including the CDC and federal and state organizations. These policies and algorithms were followed during the patient's care in the ED.   Final Clinical Impressions(s) / ED Diagnoses   Final diagnoses:  Stroke-like symptom  Syncope, unspecified syncope type    ED Discharge Orders    None       Amaryllis Dyke, PA-C 07/30/19 2117    Little, Wenda Overland, MD 07/30/19 2308

## 2019-07-30 NOTE — ED Notes (Signed)
Pt reports that her arm is getting less heavier.

## 2019-07-30 NOTE — ED Notes (Signed)
Pt. Called out, said that her left arm is becoming heavier. MD notified. CTA head and neck ordered w/ asprin.

## 2019-07-30 NOTE — H&P (Signed)
History and Physical    Abigail Moreno [id number] DOB: 09/09/1929 DOA: 07/30/2019  PCP: Mayra Neer, MD  Patient coming from: Home.  Chief Complaint: Left upper and lower extremity weakness.  HPI: Abigail Moreno is a 85 y.o. female with history of CLL under observation, hypertension takes medication only as necessary, [name]  Patient started noticing weakness of the left upper and lower extremity yesterday morning July 29, 2019.  Patient had gone to her dentist appointment with her daughter and was noticed to have some gait difficulties and patient did not want to come to the ER.  This morning when patient woke up from sleep she was trying to walk to get a drink when patient lost consciousness and passed out and fell onto her face.  Patient was brought to the ER.  ED Course: In the ER patient does have a contusion around the nasal bridge and on exam patient has weakness of the left upper and lower extremity around 4 x 5 in strength.  Right side is 5 x 5 in strength.  Patient failed swallow.  Denies any difficulty speaking or any visual symptoms.  CT head and CT maxillofacial was done which did not show the acute except for contusion of the forehead.  CT angiogram of the head and neck did not show any large vessel obstruction.  On-call neurology has been consulted patient admitted for acute CVA.  Labs show WBC of 36.9 platelets 106 creatinine 1.6 EKG shows tachycardia with monitor showing sinus rhythm.  Review of Systems: As per HPI, rest all negative.   Past Medical History:  Diagnosis Date   Anxiety    Arthritis of knee  FALL 2012   RIGHT KNEE, hips, and fingers   Breast cancer ([place]) FEBRUARY 2004   T1, NO, ER/PR POSITIVE LEFT BREAST   CLL (chronic lymphocytic leukemia) ([place]) 2004   Dr. Marko Plume is  oncologist   Dyspnea    occ sob with exertion   History of blood transfusion    with first and maybe second delivery   Hyperlipemia    Hypertension    Macular degeneration    both eyes   Osteoporosis    borderline   UTI (urinary tract infection)    on nitrofuratonin bid x 7 days started 09-12-18 am    Past Surgical History:  Procedure Laterality Date   ABDOMINAL HYSTERECTOMY  1972   UNILATERAL OOPHORECTOMY( LEFT)   APPENDECTOMY     EYE SURGERY Bilateral    cataracts   INCONTINENCE SURGERY     MASTECTOMY PARTIAL / LUMPECTOMY W/ AXILLARY LYMPHADENECTOMY  FEBRUARY 2004   LEFT BREAST   OTHER SURGICAL HISTORY     cysts removed from breasts    TOTAL HIP ARTHROPLASTY  12/26/2011   Procedure: TOTAL HIP ARTHROPLASTY ANTERIOR APPROACH;  Surgeon: Mauri Pole, MD;  Location: [facility];  Service: Orthopedics;  Laterality: Right;   TOTAL HIP ARTHROPLASTY Left 09/17/2018   Procedure: TOTAL HIP ARTHROPLASTY ANTERIOR APPROACH;  Surgeon: Paralee Cancel, MD;  Location: [facility];  Service: Orthopedics;  Laterality: Left;  70 mins     reports that she quit smoking about 60 years ago. Her smoking use included cigarettes. She has a 0.50 pack-year smoking history. She has never used smokeless tobacco. She reports that she does not drink alcohol or use drugs.  Allergies  Allergen Reactions   Colesevelam Hcl Other (See Comments)     aches   Ezetimibe Other (See Comments)    aches   Procaine Hcl Swelling    nocaine=swelling   Rosuvastatin Other (See Comments)    aches   Simvastatin Other (See Comments)    aches   Statins Other (See Comments)    aches   Sulfa Antibiotics Other (See Comments)    Bacteria in intestines     Family History  Problem Relation Age of Onset   Pancreatic cancer Maternal Grandmother    Cancer Paternal Grandmother        ? type    Cancer Other        breast-1st paternal cousin-no treatment, liver cancer-2nd maternal cousin   Hypertension  Mother    Hypertension Sister        x2   High Cholesterol Sister        x2   Heart attack Father     Prior to Admission medications   Medication Sig Start Date End Date Taking? Authorizing Provider  ALPRAZolam (XANAX) 0.25 MG tablet Take 0.25 mg by mouth every 6 (six) hours as needed for anxiety.  05/20/15  Yes [provider]  Calcium Carb-Cholecalciferol (CALCIUM 500 + D3 PO) Take 2 tablets by mouth daily.   Yes [provider]  Cinnamon 500 MG capsule Take 1,000 mg by mouth daily.    Yes [provider]  loratadine (CLARITIN) 10 MG tablet Take 10 mg by mouth daily at 12 noon.    Yes [provider]  Multiple Vitamin (MULTIVITAMIN WITH MINERALS) TABS tablet Take 1 tablet by mouth daily. Women's One A Day 50+ Multivitamin   Yes [provider]  Multiple Vitamins-Minerals (PRESERVISION AREDS 2 PO) Take 1 tablet by mouth 2 (two) times daily.   Yes [provider]  Omega-3 Fatty Acids (FISH OIL) 1000 MG CAPS Take 1,000 mg by mouth 2 (two) times daily.   Yes [provider]  tobramycin (TOBREX) 0.3 % ophthalmic solution Place 1 drop into the left eye See admin instructions. Use 4 drop into left eye 4 times daily the day before, the day of, and the day after eye injection 12/18/15  Yes [provider]  amLODipine-benazepril (LOTREL) 5-20 MG capsule Take 1 capsule by mouth daily as needed (elevated blood pressure).    [provider]  CRANBERRY PO Take 4,200 mg by mouth daily.    [provider]  docusate sodium (COLACE) 100 MG capsule Take 1 capsule (100 mg total) by mouth 2 (two) times daily. Patient not taking: Reported on 07/30/2019 09/17/18   Danae Orleans, PA-C  ferrous sulfate (FERROUSUL) 325 (65 FE) MG tablet Take 1 tablet (325 mg total) by mouth 3 (three) times daily with meals. Patient not taking: Reported on 07/30/2019 09/17/18   Danae Orleans, PA-C  HYDROcodone-acetaminophen (NORCO) 7.5-325 MG  tablet Take 1-2 tablets by mouth every 4 (four) hours as needed for moderate pain. 09/17/18   Danae Orleans, PA-C  HYDROcodone-acetaminophen (NORCO) 7.5-325 MG tablet Take 1-2 tablets by mouth every 4 (four) hours as needed for moderate pain. 09/22/18   Danae Orleans, PA-C  methocarbamol (ROBAXIN) 500 MG tablet Take 1 tablet (500 mg total) by mouth every 6 (six) hours as needed for muscle spasms. 09/17/18   Danae Orleans, PA-C  nitrofurantoin (MACRODANTIN) 100 MG capsule Take 100 mg by mouth 2 (two) times daily. X 7 days started am 09-12-18    [provider]  polyethylene glycol (MIRALAX / GLYCOLAX) packet Take 17 g by mouth 2 (two) times daily. Patient not taking: Reported on 07/30/2019 09/17/18   Danae Orleans, PA-C  pramoxine (PROCTOFOAM) 1 % foam Use as directed for hemorrhoids Patient taking differently: Place 1 application rectally every 12 (twelve) hours as needed (hemorrhoids). Use as directed for hemorrhoids 07/17/16   Livesay, Tamala Julian, MD  rivaroxaban (XARELTO) 10 MG TABS tablet Take 1 tablet (10 mg total) by mouth daily. 09/17/18   Danae Orleans, PA-C  sertraline (ZOLOFT) 50 MG tablet Take 25 mg by mouth at bedtime.    [provider]    Physical Exam: Constitutional: Moderately built and nourished. Vitals:   07/30/19 1800 07/30/19 1902 07/30/19 2200 07/30/19 2300  BP:  (!) 152/87  (!) 152/90  Pulse:  88  84  Resp: 16 15 18 20   Temp:    97.7 F (36.5 C)  TempSrc:    Oral  SpO2:  91%  94%  Weight:    60.8 kg  Height:    5' (1.524 m)   Eyes: Anicteric no pallor. ENMT: No discharge from the ears eyes nose or mouth.  Contusion seen on the nasal bridge. Neck: Contusion seen on the nasal bridge. Respiratory: No rhonchi or crepitations. Cardiovascular: S1-S2 heard. Abdomen: Nontender bowel sounds present. Musculoskeletal: No edema. Skin: No rash. Neurologic: Alert awake oriented to time place and person.  Has left upper and lower extremity 4 x 5 in  strength rest of the extremities were 5 x 5.  No facial asymmetry facial contusion seen.  Pupils are equal and reactive to light. Psychiatric: Appears normal.   Labs on Admission: I have personally reviewed following labs and imaging studies  CBC: Recent Labs  Lab 07/30/19 1005 07/30/19 1802 07/30/19 1827  WBC 36.9* 38.4*  --   NEUTROABS  --  19.2*  --   HGB 14.7 14.6 15.3*  HCT 45.9 44.8 45.0  MCV 93.9 93.5  --   PLT 106* 111*  --    Basic Metabolic Panel: Recent Labs  Lab 07/30/19 1005 07/30/19 1827  NA 139 140  K 4.2 4.4  CL 105 104  CO2 24  --   GLUCOSE 100* 101*  BUN 7* 9  CREATININE 0.63 0.50  CALCIUM 9.2  --    GFR: Estimated Creatinine Clearance: 39.6 mL/min (by C-G formula based on SCr of 0.5 mg/dL). Liver Function Tests: No results for input(s): AST, ALT, ALKPHOS, BILITOT, PROT, ALBUMIN in the last 168 hours. No results for input(s): LIPASE, AMYLASE in the last 168 hours. No results for input(s): AMMONIA in the last 168 hours. Coagulation Profile: Recent Labs  Lab 07/30/19 1802  INR 1.1   Cardiac Enzymes: No results for input(s): CKTOTAL, CKMB, CKMBINDEX, TROPONINI in the last 168 hours. BNP (last 3 results) No results for input(s): PROBNP in the last 8760 hours. HbA1C: No results for input(s): HGBA1C in the last 72 hours. CBG: No results for input(s): GLUCAP in the last 168 hours. Lipid Profile: No results for input(s): CHOL, HDL, LDLCALC, TRIG, CHOLHDL, LDLDIRECT in the last 72 hours. Thyroid Function Tests: No results for input(s): TSH, T4TOTAL, FREET4, T3FREE, THYROIDAB in the last 72 hours. Anemia Panel: No results for input(s): VITAMINB12, FOLATE, FERRITIN, TIBC, IRON, RETICCTPCT in the last 72 hours. Urine analysis:    Component Value Date/Time   COLORURINE YELLOW 07/30/2019 1915   APPEARANCEUR CLEAR 07/30/2019 1915   LABSPEC 1.013 07/30/2019 1915   LABSPEC 1.010 01/07/2015 1114   PHURINE  6.0 07/30/2019 1915   GLUCOSEU NEGATIVE  07/30/2019 1915   GLUCOSEU Negative 01/07/2015 [place] 07/30/2019 1915   BILIRUBINUR NEGATIVE 07/30/2019 1915   BILIRUBINUR N 10/05/2015 1442   BILIRUBINUR Negative 01/07/2015 1114   KETONESUR 20 (A) 07/30/2019 1915   PROTEINUR NEGATIVE 07/30/2019 1915   UROBILINOGEN negative 10/05/2015 1442   UROBILINOGEN 0.2 01/07/2015 1114   NITRITE NEGATIVE 07/30/2019 1915   LEUKOCYTESUR TRACE (A) 07/30/2019 1915   LEUKOCYTESUR Negative 01/07/2015 1114   Sepsis Labs: @LABRCNTIP (procalcitonin:4,lacticidven:4) )No results found for this or any previous visit (from the past 240 hour(s)).   Radiological Exams on Admission: Ct Angio Head W Or Wo Contrast  Result Date: 07/30/2019 CLINICAL DATA:  Dizziness and syncope. Head trauma. EXAM: CT ANGIOGRAPHY HEAD AND NECK TECHNIQUE: Multidetector CT imaging of the head and neck was performed using the standard protocol during bolus administration of intravenous contrast. Multiplanar CT image reconstructions and MIPs were obtained to evaluate the vascular anatomy. Carotid stenosis measurements (when applicable) are obtained utilizing NASCET criteria, using the distal internal carotid diameter as the denominator. CONTRAST:  53mL OMNIPAQUE IOHEXOL 350 MG/ML SOLN COMPARISON:  None. FINDINGS: CTA NECK FINDINGS SKELETON: There is no bony spinal canal stenosis. No lytic or blastic lesion. OTHER NECK: Normal pharynx, larynx and major salivary glands. No cervical lymphadenopathy. Unremarkable thyroid gland. UPPER CHEST: No pneumothorax or pleural effusion. No nodules or masses. AORTIC ARCH: There is mild calcific atherosclerosis of the aortic arch. There is no aneurysm, dissection or hemodynamically significant stenosis of the visualized portion of the aorta. Conventional 3 vessel aortic branching pattern. The visualized proximal subclavian arteries are widely patent. RIGHT CAROTID SYSTEM: No dissection, occlusion or aneurysm. There is mixed density atherosclerosis  extending into the proximal ICA, resulting in less than 50% stenosis. LEFT CAROTID SYSTEM: Normal without aneurysm, dissection or stenosis. VERTEBRAL ARTERIES: Right dominant configuration. Both origins are clearly patent. There is no dissection, occlusion or flow-limiting stenosis to the skull base (V1-V3 segments). CTA HEAD FINDINGS POSTERIOR CIRCULATION: --Vertebral arteries: Normal V4 segments. --Posterior inferior cerebellar arteries (PICA): Patent origins from the vertebral arteries. --Anterior inferior cerebellar arteries (AICA): Patent origins from the basilar artery. --Basilar artery: Normal. --Superior cerebellar arteries: Normal. --Posterior cerebral arteries: Normal. Both originate from the basilar artery. Posterior communicating arteries (p-comm) are diminutive or absent. ANTERIOR CIRCULATION: --Intracranial internal carotid arteries: Normal. --Anterior cerebral arteries (ACA): Normal. Both A1 segments are present. Patent anterior communicating artery (a-comm). --Middle cerebral arteries (MCA): Normal. VENOUS SINUSES: As permitted by contrast timing, patent. ANATOMIC VARIANTS: None Review of the MIP images confirms the above findings. IMPRESSION: 1. No emergent large vessel occlusion or high-grade stenosis of the intracranial arteries. 2. No dissection, aneurysm or hemodynamically significant stenosis of the carotid or vertebral arteries. 3. Aortic Atherosclerosis (ICD10-I70.0). Electronically Signed   By: Ulyses Jarred M.D.   On: 07/30/2019 22:16   Dg Chest 2 View  Result Date: 07/30/2019 CLINICAL DATA:  Left-sided chest pain. EXAM: CHEST - 2 VIEW COMPARISON:  02/28/2017 FINDINGS: The heart size and mediastinal contours are within normal limits. Both lungs are clear. The visualized skeletal structures are unremarkable. Aortic atherosclerosis. IMPRESSION: 1. No active cardiopulmonary disease. 2. Aortic atherosclerosis. Electronically Signed   By: Lorriane Shire M.D.   On: 07/30/2019 18:50   Ct  Head Wo Contrast  Result Date: 07/30/2019 CLINICAL DATA:  Status post fall with a blow to the head. Initial encounter. EXAM: CT HEAD WITHOUT CONTRAST CT MAXILLOFACIAL WITHOUT CONTRAST TECHNIQUE: Multidetector CT imaging of the  head and maxillofacial structures were performed using the standard protocol without intravenous contrast. Multiplanar CT image reconstructions of the maxillofacial structures were also generated. COMPARISON:  None. FINDINGS: CT HEAD FINDINGS Brain: No evidence of acute infarction, hemorrhage, hydrocephalus, extra-axial collection or mass lesion/mass effect. Chronic microvascular ischemic change noted. Vascular: No hyperdense vessel or unexpected calcification. Skull: Intact.  No focal lesion. Other: Soft tissue contusion on the forehead is seen. CT MAXILLOFACIAL FINDINGS Osseous: No fracture or mandibular dislocation. No destructive process. Orbits: Negative. No traumatic or inflammatory finding. Status post cataract surgery. Sinuses: Tiny mucous retention cyst or polyp right maxillary sinus is noted. Trace bilateral mastoid effusions. Soft tissues: Negative. IMPRESSION: Soft tissue contusion on the forehead without underlying fracture. No other acute abnormality head or face. Chronic microvascular ischemic change. Electronically Signed   By: Inge Rise M.D.   On: 07/30/2019 11:18   Ct Angio Neck W Or Wo Contrast  Result Date: 07/30/2019 CLINICAL DATA:  Dizziness and syncope. Head trauma. EXAM: CT ANGIOGRAPHY HEAD AND NECK TECHNIQUE: Multidetector CT imaging of the head and neck was performed using the standard protocol during bolus administration of intravenous contrast. Multiplanar CT image reconstructions and MIPs were obtained to evaluate the vascular anatomy. Carotid stenosis measurements (when applicable) are obtained utilizing NASCET criteria, using the distal internal carotid diameter as the denominator. CONTRAST:  41mL OMNIPAQUE IOHEXOL 350 MG/ML SOLN COMPARISON:  None.  FINDINGS: CTA NECK FINDINGS SKELETON: There is no bony spinal canal stenosis. No lytic or blastic lesion. OTHER NECK: Normal pharynx, larynx and major salivary glands. No cervical lymphadenopathy. Unremarkable thyroid gland. UPPER CHEST: No pneumothorax or pleural effusion. No nodules or masses. AORTIC ARCH: There is mild calcific atherosclerosis of the aortic arch. There is no aneurysm, dissection or hemodynamically significant stenosis of the visualized portion of the aorta. Conventional 3 vessel aortic branching pattern. The visualized proximal subclavian arteries are widely patent. RIGHT CAROTID SYSTEM: No dissection, occlusion or aneurysm. There is mixed density atherosclerosis extending into the proximal ICA, resulting in less than 50% stenosis. LEFT CAROTID SYSTEM: Normal without aneurysm, dissection or stenosis. VERTEBRAL ARTERIES: Right dominant configuration. Both origins are clearly patent. There is no dissection, occlusion or flow-limiting stenosis to the skull base (V1-V3 segments). CTA HEAD FINDINGS POSTERIOR CIRCULATION: --Vertebral arteries: Normal V4 segments. --Posterior inferior cerebellar arteries (PICA): Patent origins from the vertebral arteries. --Anterior inferior cerebellar arteries (AICA): Patent origins from the basilar artery. --Basilar artery: Normal. --Superior cerebellar arteries: Normal. --Posterior cerebral arteries: Normal. Both originate from the basilar artery. Posterior communicating arteries (p-comm) are diminutive or absent. ANTERIOR CIRCULATION: --Intracranial internal carotid arteries: Normal. --Anterior cerebral arteries (ACA): Normal. Both A1 segments are present. Patent anterior communicating artery (a-comm). --Middle cerebral arteries (MCA): Normal. VENOUS SINUSES: As permitted by contrast timing, patent. ANATOMIC VARIANTS: None Review of the MIP images confirms the above findings. IMPRESSION: 1. No emergent large vessel occlusion or high-grade stenosis of the  intracranial arteries. 2. No dissection, aneurysm or hemodynamically significant stenosis of the carotid or vertebral arteries. 3. Aortic Atherosclerosis (ICD10-I70.0). Electronically Signed   By: Ulyses Jarred M.D.   On: 07/30/2019 22:16   Ct Maxillofacial Wo Contrast  Result Date: 07/30/2019 CLINICAL DATA:  Status post fall with a blow to the head. Initial encounter. EXAM: CT HEAD WITHOUT CONTRAST CT MAXILLOFACIAL WITHOUT CONTRAST TECHNIQUE: Multidetector CT imaging of the head and maxillofacial structures were performed using the standard protocol without intravenous contrast. Multiplanar CT image reconstructions of the maxillofacial structures were also generated. COMPARISON:  None. FINDINGS:  CT HEAD FINDINGS Brain: No evidence of acute infarction, hemorrhage, hydrocephalus, extra-axial collection or mass lesion/mass effect. Chronic microvascular ischemic change noted. Vascular: No hyperdense vessel or unexpected calcification. Skull: Intact.  No focal lesion. Other: Soft tissue contusion on the forehead is seen. CT MAXILLOFACIAL FINDINGS Osseous: No fracture or mandibular dislocation. No destructive process. Orbits: Negative. No traumatic or inflammatory finding. Status post cataract surgery. Sinuses: Tiny mucous retention cyst or polyp right maxillary sinus is noted. Trace bilateral mastoid effusions. Soft tissues: Negative. IMPRESSION: Soft tissue contusion on the forehead without underlying fracture. No other acute abnormality head or face. Chronic microvascular ischemic change. Electronically Signed   By: Inge Rise M.D.   On: 07/30/2019 11:18    EKG: Independently reviewed.  Tachycardia likely sinus rhythm seen in the monitor.  Assessment/Plan Principal Problem:   Acute CVA (cerebrovascular accident) ([place]) Active Problems:   CLL (chronic lymphocytic leukemia) ([facility])   Breast cancer ([facility])   Thrombocytopenia ([place])   Essential hypertension    1. Acute CVA with left-sided hemiparesis  -patient failed swallow will get speech therapy consult.  Get MRI of the brain 2D echo check hemoglobin A1c lipid panel aspirin.  Neurology has been consulted.  Physical therapy consult.  Neurochecks. 2. Syncope -not sure if patient passed out after the fall.  Patient was feeling weak on walking.  Will closely monitor in telemetry check 2D echo.  Check orthostatics. 3. History of hypertension -patient takes blood pressure medication only if needed.  For now will allow for permissive hypertension.  As needed labetalol for systolic more than [id number] and diastolic more than [id number]. 4. History of CLL with chronic leukocytosis and thrombocytopenia follow CBC. 5. History of anxiety and depression presently takes as needed Xanax. 6. Nasal contusion after fall.  Given that patient has left-sided hemiparesis with stroke will need close monitoring and further work-up and more than 2 midnight stays in inpatient status.   DVT prophylaxis: Lovenox. Code Status: Full code. Family Communication: Discussed with patient. Disposition Plan: To be determined. Consults called: Neurology. Admission status: Inpatient.   Rise Patience MD [facility] Pager [phone].  If 7PM-7AM, please contact night-coverage [email or web address] Password [facility]  07/30/2019, 11:29 PM

## 2019-07-30 NOTE — ED Triage Notes (Signed)
Per [facility], pt from home after having syncopal episode after period of dizziness, pt fell and struck head on corner of fire place. Has laceration to nose and hematom to forehead. Denies dizziness now. Pt also had period of dizziness yesterday morning with left sided weakness, that subsided. Axox4. No neuro deficits.

## 2019-07-30 NOTE — ED Notes (Signed)
Patient transported to CT 

## 2019-07-30 NOTE — Plan of Care (Signed)
Discussed plan of care for the evening, pain management and getting Ativan prior to going to MRI with some teach back displayed.  Will re-perform swallow test and start diet if appropriate after she returns.

## 2019-07-30 NOTE — ED Notes (Signed)
Patient transported to X-ray 

## 2019-07-31 ENCOUNTER — Inpatient Hospital Stay (HOSPITAL_COMMUNITY): Payer: PPO

## 2019-07-31 DIAGNOSIS — I6389 Other cerebral infarction: Secondary | ICD-10-CM

## 2019-07-31 LAB — GLUCOSE, CAPILLARY
Glucose-Capillary: 90 mg/dL (ref 70–99)
Glucose-Capillary: 107 mg/dL — ABNORMAL HIGH (ref 70–99)
Glucose-Capillary: 97 mg/dL (ref 70–99)
Glucose-Capillary: 91 mg/dL (ref 70–99)

## 2019-07-31 LAB — ECHOCARDIOGRAM COMPLETE
Height: 60 in
Weight: 2144.63 oz

## 2019-07-31 LAB — LIPID PANEL
Cholesterol: 188 mg/dL (ref 0–200)
HDL: 45 mg/dL (ref >40)
LDL Cholesterol: 130 mg/dL — ABNORMAL HIGH (ref 0–99)
Total CHOL/HDL Ratio: 4.2 RATIO
Triglycerides: 67 mg/dL (ref <150)
VLDL: 13 mg/dL (ref 0–40)

## 2019-07-31 LAB — HEMOGLOBIN A1C
Hgb A1c MFr Bld: 5 % (ref 4.8–5.6)
Mean Plasma Glucose: 96.8 mg/dL

## 2019-07-31 LAB — SARS CORONAVIRUS 2 (TAT 6-24 HRS): SARS Coronavirus 2: NEGATIVE

## 2019-07-31 MED ORDER — ASPIRIN EC 81 MG PO TBEC
81.0000 mg | DELAYED_RELEASE_TABLET | Freq: Every day | ORAL | Status: DC
  Administered 2019-08-01: 81 mg via ORAL
  Filled 2019-07-31: qty 1

## 2019-07-31 MED ORDER — CLOPIDOGREL BISULFATE 75 MG PO TABS
75.0000 mg | ORAL_TABLET | Freq: Every day | ORAL | Status: DC
  Administered 2019-07-31 – 2019-08-01 (×2): 75 mg via ORAL
  Filled 2019-07-31 (×2): qty 1

## 2019-07-31 MED ORDER — LABETALOL HCL 5 MG/ML IV SOLN: 10.0000 mg | INTRAVENOUS | Status: DC | PRN

## 2019-07-31 NOTE — Evaluation (Signed)
Physical Therapy Evaluation Patient Details Name: Abigail Moreno MRN: [id number] DOB: 12/13/1928 Today's Date: 07/31/2019   History of Present Illness  Patient is an 85 year old female with history of CLL under observation, hypertension takes medication only as necessary, [name] MRI reads 1. 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 No hemorrhage   Clinical Impression  Pt was seen for mobility with pt struggling both with L side strength and L ribcage pain. Follow acutely to manage her balance and safety awareness, to increase standing endurance and quality of gait along with determining appropriate AD as pt progresses.  Pt has help with daughter at home so is a better candidate for CIR, as well as being independent in her home and community.    Follow Up Recommendations CIR    Equipment Recommendations  None recommended by PT    Recommendations for Other Services Rehab consult     Precautions / Restrictions Precautions Precautions: Fall(L hemi, L ribcage pain) Precaution Comments: lists to L side, weak L grip on walker Restrictions Weight Bearing Restrictions: No      Mobility  Bed Mobility Overal bed mobility: Needs Assistance Bed Mobility: Supine to Sit;Sit to Supine     Supine to sit: Mod assist Sit to supine: Min assist;Mod assist   General bed mobility comments: less help back to bed as pt can control descent with RUE  Transfers Overall transfer level: Needs assistance Equipment used: Rolling walker (2 wheeled) Transfers: Sit to/from Stand Sit to Stand: Min assist;Mod assist         General transfer comment: cues for hand placement, L grip weak but mod assist for first trial only  Ambulation/Gait Ambulation/Gait assistance: Min assist Gait Distance (Feet): 6 Feet Assistive device:  Rolling walker (2 wheeled);1 person hand held assist Gait Pattern/deviations: Step-to pattern;Decreased stride length;Wide base of support Gait velocity: reduced Gait velocity interpretation: <1.8 ft/sec, indicate of risk for recurrent falls General Gait Details: sidesteps with LLE struggling to move laterally and sits with mnimal warning  Stairs            Wheelchair Mobility    Modified Rankin (Stroke Patients Only)       Balance Overall balance assessment: Needs assistance;History of Falls Sitting-balance support: Feet supported;Single extremity supported Sitting balance-Leahy Scale: Fair Sitting balance - Comments: loses balance backward with LE movement side of bed Postural control: Posterior lean Standing balance support: Bilateral upper extremity supported;During functional activity Standing balance-Leahy Scale: Poor Standing balance comment: fatiguing with repeated standing to get bed changed                             Pertinent Vitals/Pain Pain Assessment: 0-10 Pain Score: 5  Pain Location: ribs Pain Descriptors / Indicators: Aching;Guarding Pain Intervention(s): Limited activity within patient's tolerance    Home Living Family/patient expects to be discharged to:: Private residence Living Arrangements: Alone Available Help at Discharge: Family;Available 24 hours/day Type of Home: House Home Access: Stairs to enter Entrance Stairs-Rails: None Entrance Stairs-Number of Steps: 1 onto patio then 1 into [employer]: One level Home Equipment: Cane - single point;Walker - 2 wheels;Shower seat Additional Comments: can have daughter stay with her, equip from prev hip surgery    Prior Function Level of Independence: Independent  Comments: Patient drives in community as needed     Hand Dominance   Dominant Hand: Right    Extremity/Trunk Assessment   Upper Extremity Assessment Upper Extremity Assessment: Defer to OT evaluation     Lower Extremity Assessment Lower Extremity Assessment: Generalized weakness;LLE deficits/detail LLE Deficits / Details: LLE is 4- strength LLE Coordination: decreased gross motor;decreased fine motor    Cervical / Trunk Assessment Cervical / Trunk Assessment: Other exceptions(ribcage pain from fall, no fractures) Cervical / Trunk Exceptions: L side ribcage in pain  Communication   Communication: HOH  Cognition Arousal/Alertness: Awake/alert Behavior During Therapy: Flat affect Overall Cognitive Status: Within Functional Limits for tasks assessed                                        General Comments General comments (skin integrity, edema, etc.): pt is up to sidestep side of bed with good effort, motivated and hindered by pain L ribcage and L side weakness     Exercises     Assessment/Plan    PT Assessment Patient needs continued PT services  PT Problem List Decreased strength;Decreased range of motion;Decreased activity tolerance;Decreased balance;Decreased mobility;Decreased coordination;Decreased safety awareness;Cardiopulmonary status limiting activity;Pain;Decreased skin integrity       PT Treatment Interventions DME instruction;Gait training;Stair training;Functional mobility training;Therapeutic activities;Therapeutic exercise;Balance training;Neuromuscular re-education;Patient/family education    PT Goals (Current goals can be found in the Care Plan section)  Acute Rehab PT Goals Patient Stated Goal: get stronger and get home PT Goal Formulation: With patient/family Time For Goal Achievement: 08/14/19 Potential to Achieve Goals: Good    Frequency Min 4X/week   Barriers to discharge Decreased caregiver support;Inaccessible home environment stairs to enter and family does not live with her    Co-evaluation               AM-PAC PT "6 Clicks" Mobility  Outcome Measure Help needed turning from your back to your side while in a flat bed  without using bedrails?: A Little Help needed moving from lying on your back to sitting on the side of a flat bed without using bedrails?: A Little Help needed moving to and from a bed to a chair (including a wheelchair)?: A Lot Help needed standing up from a chair using your arms (e.g., wheelchair or bedside chair)?: A Lot Help needed to walk in hospital room?: A Little Help needed climbing 3-5 steps with a railing? : Total 6 Click Score: 14    End of Session Equipment Utilized During Treatment: Gait belt Activity Tolerance: Patient limited by fatigue;Treatment limited secondary to medical complications (Comment) Patient left: in bed;with call bell/phone within reach;with family/visitor present;with nursing/sitter in room Nurse Communication: Mobility status PT Visit Diagnosis: Unsteadiness on feet (R26.81);Muscle weakness (generalized) (M62.81);Difficulty in walking, not elsewhere classified (R26.2);Hemiplegia and hemiparesis;Pain Hemiplegia - Right/Left: Left Hemiplegia - dominant/non-dominant: Non-dominant Hemiplegia - caused by: Cerebral infarction Pain - Right/Left: Left Pain - part of body: Shoulder(trunk)    Time: [id number] PT Time Calculation (min) (ACUTE ONLY): 40 min   Charges:   PT Evaluation $PT Eval Moderate Complexity: 1 Mod PT Treatments $Gait Training: 8-22 mins $Therapeutic Exercise: 8-22 mins       Ramond Dial 07/31/2019, 12:10 PM   Mee Hives, PT MS Acute Rehab Dept. Number: [place] [facility] [place]

## 2019-07-31 NOTE — Progress Notes (Signed)
Rehab Admissions Coordinator Note:  Patient was screened by Cleatrice Burke for appropriateness for an Inpatient Acute Rehab Consult per therapy recommendation. I will place order for rehab consult.  Cleatrice Burke RN MSN 07/31/2019, 10:00 AM  I can be reached at [phone].

## 2019-07-31 NOTE — Evaluation (Signed)
Occupational Therapy Evaluation Patient Details Name: Abigail Moreno MRN: [id number] DOB: [birth year] Today's Date: 07/31/2019    History of Present Illness Patient is an 85 year old female with history of CLL under observation, hypertension takes medication only as necessary, [name] MRI reads 1. 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 No hemorrhage    Clinical Impression   Patient is an 85 year old female that lives alone in a single level home with 1 step up to patio then 1 step into the den. Patient does not use AD normally, fully I, drives. Does have a daughter that lives locally and can stay with her as needed. Patient currently demonstrating left upper and lower extremity deficits (weakness, discoordination, sequencing) requiring min/mod A for functional transfers and mod A for bed mobility. Patient would benefit from further OT intervention in acute setting.     Follow Up Recommendations  CIR    Equipment Recommendations  Other (comment)(to be determined at next venue)    Recommendations for Other Services Rehab consult     Precautions / Restrictions Precautions Precautions: Fall Restrictions Weight Bearing Restrictions: No      Mobility Bed Mobility Overal bed mobility: Needs Assistance Bed Mobility: Supine to Sit;Sit to Supine     Supine to sit: Mod assist Sit to supine: Mod assist      Transfers Overall transfer level: Needs assistance Equipment used: Rolling walker (2 wheeled) Transfers: Sit to/from Stand Sit to Stand: Min assist;Mod assist         General transfer comment: verbal and tactile cues for safety with body mechanics    Balance Overall balance assessment: Needs assistance Sitting-balance support: Feet supported;No upper extremity supported Sitting balance-Leahy Scale:  Good     Standing balance support: Bilateral upper extremity supported;During functional activity Standing balance-Leahy Scale: Poor                             ADL either performed or assessed with clinical judgement   ADL Overall ADL's : Needs assistance/impaired     Grooming: Set up;Wash/dry face;Sitting   Upper Body Bathing: Set up;Sitting   Lower Body Bathing: Moderate assistance;Sitting/lateral leans   Upper Body Dressing : Set up;Sitting   Lower Body Dressing: Moderate assistance;Sitting/lateral leans Lower Body Dressing Details (indicate cue type and reason): increased time/difficulty doffing L sock, unable to don safety without heavy forward leaning Toilet Transfer: Minimal assistance;Moderate assistance;Ambulation;BSC;RW;Cueing for safety;Cueing for sequencing Toilet Transfer Details (indicate cue type and reason): simulated with side stepping at EOB, difficulty coordination L LE and sequencing with rolling walker Toileting- Clothing Manipulation and Hygiene: Minimal assistance;Sit to/from stand       Functional mobility during ADLs: Minimal assistance;Moderate assistance;Rolling walker;Cueing for safety;Cueing for sequencing       Vision Baseline Vision/History: Wears glasses Wears Glasses: (patient reports mostly for reading ) Patient Visual Report: No change from baseline       Perception Perception Perception Tested?: Yes Perception Deficits: (Dysmetria + with L UE) Comments: difficulty with accuracy touching her nose and therapists finger, requires increased time   Praxis Praxis Praxis tested?: Deficits Deficits: Organization Praxis-Other Comments: difficulty sequencing use of walker with ambulation     Pertinent Vitals/Pain Pain Assessment: 0-10 Pain Score: 5  Pain Location: ribs Pain Descriptors / Indicators: Aching;Guarding Pain  Intervention(s): Limited activity within patient's tolerance;Monitored during session;Other  (comment)(Notified RN)     Hand Dominance Right   Extremity/Trunk Assessment Upper Extremity Assessment Upper Extremity Assessment: RUE deficits/detail;LUE deficits/detail RUE Deficits / Details: R shoulder pt reports "rotator cuff injury"  RUE: (shoulder flexion limited ~100 degrees) LUE: Unable to fully assess due to pain(limited shoulder ROM to approx 100 degrees flexion) LUE Coordination: decreased fine motor;decreased gross motor   Lower Extremity Assessment Lower Extremity Assessment: Defer to PT evaluation       Communication Communication Communication: HOH   Cognition Arousal/Alertness: Awake/alert Behavior During Therapy: WFL for tasks assessed/performed Overall Cognitive Status: Within Functional Limits for tasks assessed                                 General Comments: A/O x4, follows commands and is aware she has L deficits              Home Living Family/patient expects to be discharged to:: Private residence Living Arrangements: Alone Available Help at Discharge: Family;Available 24 hours/day Type of Home: House Home Access: Stairs to enter [employer] of Steps: 1 onto patio then 1 into [employer]: None Home Layout: One level     Bathroom Shower/Tub: Occupational psychologist: Handicapped height Bathroom Accessibility: Yes How Accessible: Accessible via walker Home Equipment: [place] - single point;Walker - 2 wheels;Shower seat   Additional Comments: Patient's DTR lives locally and can stay with her as needed.      Prior Functioning/Environment Level of Independence: Independent        Comments: Patient drives in community as needed        OT Problem List: Decreased strength;Decreased range of motion;Decreased activity tolerance;Impaired balance (sitting and/or standing);Impaired vision/perception;Decreased coordination;Decreased safety awareness;Impaired UE functional use;Pain      OT  Treatment/Interventions: Self-care/ADL training;Therapeutic exercise;Neuromuscular education;DME and/or AE instruction;Therapeutic activities;Visual/perceptual remediation/compensation;Patient/family education;Balance training    OT Goals(Current goals can be found in the care plan section) Acute Rehab OT Goals Patient Stated Goal: to get better OT Goal Formulation: With patient Time For Goal Achievement: 08/14/19 Potential to Achieve Goals: Good  OT Frequency: Min 2X/week    AM-PAC OT "6 Clicks" Daily Activity     Outcome Measure Help from another person eating meals?: None Help from another person taking care of personal grooming?: A Little Help from another person toileting, which includes using toliet, bedpan, or urinal?: A Lot Help from another person bathing (including washing, rinsing, drying)?: A Lot Help from another person to put on and taking off regular upper body clothing?: A Little Help from another person to put on and taking off regular lower body clothing?: A Lot 6 Click Score: 16   End of Session Equipment Utilized During Treatment: Rolling walker Nurse Communication: Mobility status  Activity Tolerance: Patient tolerated treatment well Patient left: in bed;with call bell/phone within reach;with bed alarm set  OT Visit Diagnosis: Unsteadiness on feet (R26.81);Other abnormalities of gait and mobility (R26.89);Muscle weakness (generalized) (M62.81);Pain Pain - part of body: (ribs)                Time: [id number] OT Time Calculation (min): 45 min Charges:  OT General Charges $OT Visit: 1 Visit OT Evaluation $OT Eval Moderate Complexity: 1 Mod OT Treatments $Self Care/Home Management : 23-37 mins  Shon Millet OT OT office: [place] 07/31/2019, 9:44 AM

## 2019-07-31 NOTE — Progress Notes (Signed)
STROKE TEAM PROGRESS NOTE   INTERVAL HISTORY Her daugher is at the bedside.  She has help at home. She plans to go home with her daughter for a while following discharge.  I have personally reviewed history of presenting illness and details, electronic medical records as well as imaging films in PACS.  She states her speech is improving.  She has no complaints.  MRI scan of the brain confirms a right pontine lacunar infarct.  CT angiogram shows no significant large vessel intra or extracranial stenosis.  2D echo is pending.  LDL cholesterol is elevated 130 mg percent  Vitals:   07/30/19 2300 07/31/19 0300 07/31/19 0552 07/31/19 0729  BP: (!) 152/90 126/76 126/82 132/85  Pulse: 84 80 90 88  Resp: 20 16 16    Temp: 97.7 F (36.5 C) 98.1 F (36.7 C) 98.2 F (36.8 C) 97.8 F (36.6 C)  TempSrc: Oral Oral Oral Oral  SpO2: 94% 93% 92% 94%  Weight: 60.8 kg     Height: 5' (1.524 m)       CBC:  Recent Labs  Lab 07/30/19 1005 07/30/19 1802 07/30/19 1827  WBC 36.9* 38.4*  --   NEUTROABS  --  19.2*  --   HGB 14.7 14.6 15.3*  HCT 45.9 44.8 45.0  MCV 93.9 93.5  --   PLT 106* 111*  --     Basic Metabolic Panel:  Recent Labs  Lab 07/30/19 1005 07/30/19 1827  NA 139 140  K 4.2 4.4  CL 105 104  CO2 24  --   GLUCOSE 100* 101*  BUN 7* 9  CREATININE 0.63 0.50  CALCIUM 9.2  --    Lipid Panel:     Component Value Date/Time   CHOL 188 07/31/2019 0547   TRIG 67 07/31/2019 0547   HDL 45 07/31/2019 0547   CHOLHDL 4.2 07/31/2019 0547   VLDL 13 07/31/2019 0547   LDLCALC 130 (H) 07/31/2019 0547   HgbA1c:  Lab Results  Component Value Date   HGBA1C 5.0 07/31/2019   Urine Drug Screen:     Component Value Date/Time   LABOPIA NONE DETECTED 07/30/2019 1911   COCAINSCRNUR NONE DETECTED 07/30/2019 1911   LABBENZ POSITIVE (A) 07/30/2019 1911   AMPHETMU NONE DETECTED 07/30/2019 1911   THCU NONE DETECTED 07/30/2019 1911   LABBARB NONE DETECTED 07/30/2019 1911    Alcohol Level      Component Value Date/Time   ETH <10 07/30/2019 1802    IMAGING Ct Angio Head W Or Wo Contrast  Result Date: 07/30/2019 CLINICAL DATA:  Dizziness and syncope. Head trauma. EXAM: CT ANGIOGRAPHY HEAD AND NECK TECHNIQUE: Multidetector CT imaging of the head and neck was performed using the standard protocol during bolus administration of intravenous contrast. Multiplanar CT image reconstructions and MIPs were obtained to evaluate the vascular anatomy. Carotid stenosis measurements (when applicable) are obtained utilizing NASCET criteria, using the distal internal carotid diameter as the denominator. CONTRAST:  53mL OMNIPAQUE IOHEXOL 350 MG/ML SOLN COMPARISON:  None. FINDINGS: CTA NECK FINDINGS SKELETON: There is no bony spinal canal stenosis. No lytic or blastic lesion. OTHER NECK: Normal pharynx, larynx and major salivary glands. No cervical lymphadenopathy. Unremarkable thyroid gland. UPPER CHEST: No pneumothorax or pleural effusion. No nodules or masses. AORTIC ARCH: There is mild calcific atherosclerosis of the aortic arch. There is no aneurysm, dissection or hemodynamically significant stenosis of the visualized portion of the aorta. Conventional 3 vessel aortic branching pattern. The visualized proximal subclavian arteries are widely patent. RIGHT CAROTID  SYSTEM: No dissection, occlusion or aneurysm. There is mixed density atherosclerosis extending into the proximal ICA, resulting in less than 50% stenosis. LEFT CAROTID SYSTEM: Normal without aneurysm, dissection or stenosis. VERTEBRAL ARTERIES: Right dominant configuration. Both origins are clearly patent. There is no dissection, occlusion or flow-limiting stenosis to the skull base (V1-V3 segments). CTA HEAD FINDINGS POSTERIOR CIRCULATION: --Vertebral arteries: Normal V4 segments. --Posterior inferior cerebellar arteries (PICA): Patent origins from the vertebral arteries. --Anterior inferior cerebellar arteries (AICA): Patent origins from the basilar  artery. --Basilar artery: Normal. --Superior cerebellar arteries: Normal. --Posterior cerebral arteries: Normal. Both originate from the basilar artery. Posterior communicating arteries (p-comm) are diminutive or absent. ANTERIOR CIRCULATION: --Intracranial internal carotid arteries: Normal. --Anterior cerebral arteries (ACA): Normal. Both A1 segments are present. Patent anterior communicating artery (a-comm). --Middle cerebral arteries (MCA): Normal. VENOUS SINUSES: As permitted by contrast timing, patent. ANATOMIC VARIANTS: None Review of the MIP images confirms the above findings. IMPRESSION: 1. No emergent large vessel occlusion or high-grade stenosis of the intracranial arteries. 2. No dissection, aneurysm or hemodynamically significant stenosis of the carotid or vertebral arteries. 3. Aortic Atherosclerosis (ICD10-I70.0). Electronically Signed   By: Ulyses Jarred M.D.   On: 07/30/2019 22:16   Dg Chest 2 View  Result Date: 07/30/2019 CLINICAL DATA:  Left-sided chest pain. EXAM: CHEST - 2 VIEW COMPARISON:  02/28/2017 FINDINGS: The heart size and mediastinal contours are within normal limits. Both lungs are clear. The visualized skeletal structures are unremarkable. Aortic atherosclerosis. IMPRESSION: 1. No active cardiopulmonary disease. 2. Aortic atherosclerosis. Electronically Signed   By: Lorriane Shire M.D.   On: 07/30/2019 18:50   Ct Head Wo Contrast  Result Date: 07/30/2019 CLINICAL DATA:  Status post fall with a blow to the head. Initial encounter. EXAM: CT HEAD WITHOUT CONTRAST CT MAXILLOFACIAL WITHOUT CONTRAST TECHNIQUE: Multidetector CT imaging of the head and maxillofacial structures were performed using the standard protocol without intravenous contrast. Multiplanar CT image reconstructions of the maxillofacial structures were also generated. COMPARISON:  None. FINDINGS: CT HEAD FINDINGS Brain: No evidence of acute infarction, hemorrhage, hydrocephalus, extra-axial collection or mass  lesion/mass effect. Chronic microvascular ischemic change noted. Vascular: No hyperdense vessel or unexpected calcification. Skull: Intact.  No focal lesion. Other: Soft tissue contusion on the forehead is seen. CT MAXILLOFACIAL FINDINGS Osseous: No fracture or mandibular dislocation. No destructive process. Orbits: Negative. No traumatic or inflammatory finding. Status post cataract surgery. Sinuses: Tiny mucous retention cyst or polyp right maxillary sinus is noted. Trace bilateral mastoid effusions. Soft tissues: Negative. IMPRESSION: Soft tissue contusion on the forehead without underlying fracture. No other acute abnormality head or face. Chronic microvascular ischemic change. Electronically Signed   By: Inge Rise M.D.   On: 07/30/2019 11:18   Ct Angio Neck W Or Wo Contrast  Result Date: 07/30/2019 CLINICAL DATA:  Dizziness and syncope. Head trauma. EXAM: CT ANGIOGRAPHY HEAD AND NECK TECHNIQUE: Multidetector CT imaging of the head and neck was performed using the standard protocol during bolus administration of intravenous contrast. Multiplanar CT image reconstructions and MIPs were obtained to evaluate the vascular anatomy. Carotid stenosis measurements (when applicable) are obtained utilizing NASCET criteria, using the distal internal carotid diameter as the denominator. CONTRAST:  39mL OMNIPAQUE IOHEXOL 350 MG/ML SOLN COMPARISON:  None. FINDINGS: CTA NECK FINDINGS SKELETON: There is no bony spinal canal stenosis. No lytic or blastic lesion. OTHER NECK: Normal pharynx, larynx and major salivary glands. No cervical lymphadenopathy. Unremarkable thyroid gland. UPPER CHEST: No pneumothorax or pleural effusion. No nodules or masses. AORTIC  ARCH: There is mild calcific atherosclerosis of the aortic arch. There is no aneurysm, dissection or hemodynamically significant stenosis of the visualized portion of the aorta. Conventional 3 vessel aortic branching pattern. The visualized proximal subclavian  arteries are widely patent. RIGHT CAROTID SYSTEM: No dissection, occlusion or aneurysm. There is mixed density atherosclerosis extending into the proximal ICA, resulting in less than 50% stenosis. LEFT CAROTID SYSTEM: Normal without aneurysm, dissection or stenosis. VERTEBRAL ARTERIES: Right dominant configuration. Both origins are clearly patent. There is no dissection, occlusion or flow-limiting stenosis to the skull base (V1-V3 segments). CTA HEAD FINDINGS POSTERIOR CIRCULATION: --Vertebral arteries: Normal V4 segments. --Posterior inferior cerebellar arteries (PICA): Patent origins from the vertebral arteries. --Anterior inferior cerebellar arteries (AICA): Patent origins from the basilar artery. --Basilar artery: Normal. --Superior cerebellar arteries: Normal. --Posterior cerebral arteries: Normal. Both originate from the basilar artery. Posterior communicating arteries (p-comm) are diminutive or absent. ANTERIOR CIRCULATION: --Intracranial internal carotid arteries: Normal. --Anterior cerebral arteries (ACA): Normal. Both A1 segments are present. Patent anterior communicating artery (a-comm). --Middle cerebral arteries (MCA): Normal. VENOUS SINUSES: As permitted by contrast timing, patent. ANATOMIC VARIANTS: None Review of the MIP images confirms the above findings. IMPRESSION: 1. No emergent large vessel occlusion or high-grade stenosis of the intracranial arteries. 2. No dissection, aneurysm or hemodynamically significant stenosis of the carotid or vertebral arteries. 3. Aortic Atherosclerosis (ICD10-I70.0). Electronically Signed   By: Ulyses Jarred M.D.   On: 07/30/2019 22:16   Mr Angio Head Wo Contrast  Result Date: 07/31/2019 CLINICAL DATA:  Leukemia. Left-sided weakness. EXAM: MRI HEAD WITHOUT CONTRAST MRA HEAD WITHOUT CONTRAST TECHNIQUE: Multiplanar, multiecho pulse sequences of the brain and surrounding structures were obtained without intravenous contrast. Angiographic images of the head were  obtained using MRA technique without contrast. COMPARISON:  None. FINDINGS: MRI HEAD FINDINGS BRAIN: Small acute infarct of the superior right pons. Early confluent hyperintense T2-weighted signal of the periventricular and deep white matter, most commonly due to chronic ischemic microangiopathy. The cerebral and cerebellar volume are age-appropriate. There is no hydrocephalus. The midline structures are normal. VASCULAR: The major intracranial arterial and venous sinus flow voids are normal. Susceptibility-sensitive sequences show no chronic microhemorrhage or superficial siderosis. SKULL AND UPPER CERVICAL SPINE: Calvarial bone marrow signal is normal. There is no skull base mass. The visualized upper cervical spine and soft tissues are normal. SINUSES/ORBITS: There are no fluid levels or advanced mucosal thickening. The mastoid air cells and middle ear cavities are free of fluid. The orbits are normal. MRA HEAD FINDINGS POSTERIOR CIRCULATION: --Vertebral arteries: Normal V4 segments. --Posterior inferior cerebellar arteries (PICA): Patent origins from the vertebral arteries. --Anterior inferior cerebellar arteries (AICA): Patent origins from the basilar artery. --Basilar artery: Normal. --Superior cerebellar arteries: Normal. --Posterior cerebral arteries: Normal. Both originate from the basilar artery. Posterior communicating arteries (p-comm) are diminutive or absent. ANTERIOR CIRCULATION: --Intracranial internal carotid arteries: Normal. --Anterior cerebral arteries (ACA): Normal. Both A1 segments are present. Patent anterior communicating artery (a-comm). --Middle cerebral arteries (MCA): Normal. IMPRESSION: 1. 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 No hemorrhage or mass effect. 2. Normal intracranial MRA. 3. Chronic ischemic microangiopathy. Electronically Signed   By: Ulyses Jarred M.D.   On: 07/31/2019 00:29   Mr Brain Wo Contrast (neuro Protocol)  Result Date: 07/31/2019 CLINICAL DATA:   Leukemia. Left-sided weakness. EXAM: MRI HEAD WITHOUT CONTRAST MRA HEAD WITHOUT CONTRAST TECHNIQUE: Multiplanar, multiecho pulse sequences of the brain and surrounding structures were obtained without intravenous contrast. Angiographic images of the head were obtained  using MRA technique without contrast. COMPARISON:  None. FINDINGS: MRI HEAD FINDINGS BRAIN: Small acute infarct of the superior right pons. Early confluent hyperintense T2-weighted signal of the periventricular and deep white matter, most commonly due to chronic ischemic microangiopathy. The cerebral and cerebellar volume are age-appropriate. There is no hydrocephalus. The midline structures are normal. VASCULAR: The major intracranial arterial and venous sinus flow voids are normal. Susceptibility-sensitive sequences show no chronic microhemorrhage or superficial siderosis. SKULL AND UPPER CERVICAL SPINE: Calvarial bone marrow signal is normal. There is no skull base mass. The visualized upper cervical spine and soft tissues are normal. SINUSES/ORBITS: There are no fluid levels or advanced mucosal thickening. The mastoid air cells and middle ear cavities are free of fluid. The orbits are normal. MRA HEAD FINDINGS POSTERIOR CIRCULATION: --Vertebral arteries: Normal V4 segments. --Posterior inferior cerebellar arteries (PICA): Patent origins from the vertebral arteries. --Anterior inferior cerebellar arteries (AICA): Patent origins from the basilar artery. --Basilar artery: Normal. --Superior cerebellar arteries: Normal. --Posterior cerebral arteries: Normal. Both originate from the basilar artery. Posterior communicating arteries (p-comm) are diminutive or absent. ANTERIOR CIRCULATION: --Intracranial internal carotid arteries: Normal. --Anterior cerebral arteries (ACA): Normal. Both A1 segments are present. Patent anterior communicating artery (a-comm). --Middle cerebral arteries (MCA): Normal. IMPRESSION: 1. Small acute infarct of the superior right  pons. No hemorrhage or mass effect. 2. Normal intracranial MRA. 3. Chronic ischemic microangiopathy. Electronically Signed   By: Ulyses Jarred M.D.   On: 07/31/2019 00:29   Ct Maxillofacial Wo Contrast  Result Date: 07/30/2019 CLINICAL DATA:  Status post fall with a blow to the head. Initial encounter. EXAM: CT HEAD WITHOUT CONTRAST CT MAXILLOFACIAL WITHOUT CONTRAST TECHNIQUE: Multidetector CT imaging of the head and maxillofacial structures were performed using the standard protocol without intravenous contrast. Multiplanar CT image reconstructions of the maxillofacial structures were also generated. COMPARISON:  None. FINDINGS: CT HEAD FINDINGS Brain: No evidence of acute infarction, hemorrhage, hydrocephalus, extra-axial collection or mass lesion/mass effect. Chronic microvascular ischemic change noted. Vascular: No hyperdense vessel or unexpected calcification. Skull: Intact.  No focal lesion. Other: Soft tissue contusion on the forehead is seen. CT MAXILLOFACIAL FINDINGS Osseous: No fracture or mandibular dislocation. No destructive process. Orbits: Negative. No traumatic or inflammatory finding. Status post cataract surgery. Sinuses: Tiny mucous retention cyst or polyp right maxillary sinus is noted. Trace bilateral mastoid effusions. Soft tissues: Negative. IMPRESSION: Soft tissue contusion on the forehead without underlying fracture. No other acute abnormality head or face. Chronic microvascular ischemic change. Electronically Signed   By: Inge Rise M.D.   On: 07/30/2019 11:18    PHYSICAL EXAM Pleasant frail elderly Caucasian lady not in distress. . Afebrile. Head is nontraumatic. Neck is supple without bruit.    Cardiac exam no murmur or gallop. Lungs are clear to auscultation. Distal pulses are well felt. Neurological Exam ;  Awake  Alert oriented x 3. Normal speech and language.eye movements full without nystagmus.fundi were not visualized. Vision acuity and fields appear normal. Hearing  is normal. Palatal movements are normal. Face asymmetric mild left lower facial weakness.. Tongue midline. Normal strength, tone, reflexes and coordination except mild weakness of left grip and intrinsic hand muscles.  Fine finger movements are diminished on the left.  Albeit slight of left upper extremity.. Normal sensation. Gait deferred.  ASSESSMENT/PLAN [name] is a 85 y.o. female with history of HTN, HLD presenting with L sided weakness.    Stroke:   Small R pontine infarct secondary to small vessel disease  CT head / CT maxillofacial soft tissue forehead contusion w/o fx. Small vessel disease.   CTA head & neck No ELVO. Aortic atherosclerosis.   MRI  Small R pontine infarct. Small vessel disease.   MRA  Unremarkable   2D Echo EF 65-70%. No source of embolus   LDL 130  HgbA1c 5.0  Lovenox 40 mg sq daily for VTE prophylaxis  No antithrombotic prior to admission, now on aspirin 325 mg dailyGiven mild stroke, recommend aspirin 81 mg and plavix 75 mg daily x 3 weeks, then aspirin alone. Orders adjusted.   Therapy recommendations:  CIR  Disposition:  pending   Hypertension  Stable . Permissive hypertension (OK if < 220/120) but gradually normalize in 5-7 days . Long-term BP goal normotensive  Hyperlipidemia  Home meds:  Fish oil  Intolerant to statins  LDL 130, goal < 70  Continue statin at discharge  Other Stroke Risk Factors  Advanced age  Former Cigarette smoker, quit 60 yrs ago  Other Active Problems  Macular degeneration, bilateral   Hx breast cancer  CLL ([place])  Hospital day # 1 I have personally obtained history,examined this patient, reviewed notes, independently viewed imaging studies, participated in medical decision making and plan of care.ROS completed by me personally and pertinent positives fully documented  I have made any additions or clarifications directly to the above note.  She presented with left facial and hand  weakness secondary to right pontine lacunar infarct from small vessel disease.  Continue ongoing stroke work-up and aggressive risk factor modification.  Dual antiplatelet therapy of aspirin and Plavix for 3 weeks followed by Aspirin alone.  Long discussion with patient and daughter at the bedside and answered questions.  Greater than 50% time during this 35-minute visit was spent on counseling and coordination of care about lacunar stroke and discussion about need for rehab and aggressive risk factor modification and answering questions. Antony Contras, MD Medical Director [facility] Pager: [phone] 07/31/2019 4:41 PM   To contact Stroke Continuity provider, please refer to [email or web address] After hours, contact General Neurology

## 2019-07-31 NOTE — Consult Note (Signed)
Inpatient Rehab Admissions:  Inpatient Rehab Consult received.  I met with pt and her daugther at the bedside for rehabilitation assessment and to discuss goals and expectations of an inpatient rehab admission. Feel pt is a great candidate for CIR at this time. Pt is interested in the program once she is medically ready. Will follow for neurology consult and completion of workup.   Abigail Moreno, OTR/L  Rehab Admissions Coordinator  [phone] 07/31/2019 12:45 PM

## 2019-07-31 NOTE — Consult Note (Signed)
Neurology Consultation Reason for Consult: Concern for stroke Referring Physician: Little, R  CC: Concern for stroke  History is obtained from: Patient  HPI: Abigail Moreno is a 85 y.o. female with a history of hypertension, hyperlipidemia who presents with left-sided weakness that was present on awakening.  She states that she woke up with symptoms yesterday, but they improved over the course of the day.  Subsequently she went to bed last night and when she awoke, they were worse and have persistently remained so throughout the course of the day.   LKW: 11/10 prior to bed tpa given?: no, outside of window    ROS: A 14 point ROS was performed and is negative except as noted in the HPI.  Past Medical History:  Diagnosis Date  . Anxiety   . Arthritis of knee  FALL 2012   RIGHT KNEE, hips, and fingers  . Breast cancer ([place]) FEBRUARY 2004   T1, NO, ER/PR POSITIVE LEFT BREAST  . CLL (chronic lymphocytic leukemia) ([place]) 2004   Dr. Marko Plume is oncologist  . Dyspnea    occ sob with exertion  . History of blood transfusion    with first and maybe second delivery  . Hyperlipemia   . Hypertension   . Macular degeneration    both eyes  . Osteoporosis    borderline  . UTI (urinary tract infection)    on nitrofuratonin bid x 7 days started 09-12-18 am     Family History  Problem Relation Age of Onset  . Pancreatic cancer Maternal Grandmother   . Cancer Paternal Grandmother        ? type   . Cancer Other        breast-1st paternal cousin-no treatment, liver cancer-2nd maternal cousin  . Hypertension Mother   . Hypertension Sister        x2  . High Cholesterol Sister        x2  . Heart attack Father      Social History:  reports that she quit smoking about 60 years ago. Her smoking use included cigarettes. She has a 0.50 pack-year smoking history. She has never used smokeless tobacco. She reports that she does not drink alcohol or use drugs.   Exam: Current vital  signs: BP (!) 152/90 (BP Location: Right Arm)   Pulse 84   Temp 97.7 F (36.5 C) (Oral)   Resp 20   Ht 5' (1.524 m)   Wt 60.8 kg   SpO2 94%   BMI 26.18 kg/m  Vital signs in last 24 hours: Temp:  [97.7 F (36.5 C)-97.9 F (36.6 C)] 97.7 F (36.5 C) (11/11 2300) Pulse Rate:  [84-89] 84 (11/11 2300) Resp:  [14-21] 20 (11/11 2300) BP: (121-165)/(80-106) 152/90 (11/11 2300) SpO2:  [91 %-95 %] 94 % (11/11 2300) Weight:  [60.8 kg] 60.8 kg (11/11 2300)   Physical Exam  Constitutional: Appears well-developed and well-nourished.  Psych: Affect appropriate to situation Eyes: No scleral injection HENT: No OP obstrucion MSK: no joint deformities.  Cardiovascular: Normal rate and regular rhythm.  Respiratory: Effort normal, non-labored breathing GI: Soft.  No distension. There is no tenderness.  Skin: WDI  Neuro: Mental Status: Patient is awake, alert, oriented to person, place, month, year, and situation. Patient is able to give a clear and coherent history. No signs of aphasia or neglect Cranial Nerves: II: Visual Fields are full. Pupils are equal, round, and reactive to light.   III,IV, VI: EOMI without ptosis or diploplia.  V:  Facial sensation is symmetric to temperature VII: Facial movement with mild left facial weakness VIII: hearing is intact to voice X: Uvula elevates symmetrically XI: Shoulder shrug is symmetric. XII: tongue is midline without atrophy or fasciculations.  Motor: Tone is normal. Bulk is normal. 5/5 strength was present on the right, she has very mild left arm weakness, no clear weakness to confrontation left leg  sensory: Sensation is symmetric to light touch and temperature in the arms and legs. Deep Tendon Reflexes: 2+ and symmetric in the biceps and patellae.  Cerebellar: FNF and HKS are intact on the right, impaired on the left  I have reviewed labs in epic and the results pertinent to this consultation are: BMP-unremarkable  I have reviewed  the images obtained: MRI brain - pointine infarct.   Impression: 85 yo F with pontine infarct. She will need to be admitted for secondary risk factor modification and therapy.   Recommendations: - HgbA1c, fasting lipid panel - MRI  of the brain without contrast - Frequent neuro checks - Prophylactic therapy-Antiplatelet med: Aspirin - dose 325mg  PO or 300mg  PR - Risk factor modification - Telemetry monitoring - PT consult, OT consult, Speech consult - Stroke team to follow   Roland Rack, MD [facility] [phone]  If 7pm- 7am, please page neurology on call as listed in [place].

## 2019-07-31 NOTE — Evaluation (Signed)
Clinical/Bedside Swallow Evaluation Patient Details  Name: Abigail Moreno MRN: [id number] Date of Birth: [birth year]  Today's Date: 07/31/2019 Time: SLP Start Time (ACUTE ONLY): 66 SLP Stop Time (ACUTE ONLY): 1110 SLP Time Calculation (min) (ACUTE ONLY): 20 min  Past Medical History:  Past Medical History:  Diagnosis Date  . Anxiety   . Arthritis of knee  FALL 2012   RIGHT KNEE, hips, and fingers  . Breast cancer ([place]) FEBRUARY 2004   T1, NO, ER/PR POSITIVE LEFT BREAST  . CLL (chronic lymphocytic leukemia) ([place]) 2004   Dr. Marko Plume is oncologist  . Dyspnea    occ sob with exertion  . History of blood transfusion    with first and maybe second delivery  . Hyperlipemia   . Hypertension   . Macular degeneration    both eyes  . Osteoporosis    borderline  . UTI (urinary tract infection)    on nitrofuratonin bid x 7 days started 09-12-18 am   Past Surgical History:  Past Surgical History:  Procedure Laterality Date  . ABDOMINAL HYSTERECTOMY  1972   UNILATERAL OOPHORECTOMY( LEFT)  . APPENDECTOMY    . EYE SURGERY Bilateral    cataracts  . INCONTINENCE SURGERY    . MASTECTOMY PARTIAL / LUMPECTOMY W/ AXILLARY LYMPHADENECTOMY  FEBRUARY 2004   LEFT BREAST  . OTHER SURGICAL HISTORY     cysts removed from breasts   . TOTAL HIP ARTHROPLASTY  12/26/2011   Procedure: TOTAL HIP ARTHROPLASTY ANTERIOR APPROACH;  Surgeon: Mauri Pole, MD;  Location: [facility];  Service: Orthopedics;  Laterality: Right;  . TOTAL HIP ARTHROPLASTY Left 09/17/2018   Procedure: TOTAL HIP ARTHROPLASTY ANTERIOR APPROACH;  Surgeon: Paralee Cancel, MD;  Location: [facility];  Service: Orthopedics;  Laterality: Left;  70 mins   HPI:  Abigail Moreno, 89y/f, presented to ER after a syncopal episode with persistent weakness of left upper and lower extremity. PMH of Chronic lylmphocytic leukemia, Dyspnea, hyperlipemia, hypertension, macular degeneration of both eyes, osteoprosis, breast cancer, anxiety.  Contusion  around nasal bridge from fall. 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 No hemorrhage or mass effect. Patient failed [facility] Mace Screen in ER.    Assessment / Plan / Recommendation Clinical Impression  Clinical swallow assessment completed at bedside. Patient presents with a normal swallow on assessment. Oral motor exam revealed a mild left facial droop with normal sensation. Presentation of ice chips, thin liquid (cup and straw), puree and solids were all tolerated across consistencies and volumes with no s/sx of aspiration. Abigail Moreno reports a history of xerostomia and may benefit from biotene. Recommend regular diet, thin liquids, medication given whole with liquids. ST to follow for diet tolerance.  SLP Visit Diagnosis: Dysphagia, unspecified (R13.10)    Aspiration Risk  No limitations    Diet Recommendation Regular;Thin liquid   Liquid Administration via: Cup;Straw Medication Administration: Whole meds with liquid Supervision: Patient able to self feed Compensations: Minimize environmental distractions;Slow rate;Small sips/bites Postural Changes: Seated upright at 90 degrees    Other  Recommendations Oral Care Recommendations: Oral care BID   Follow up Recommendations        Frequency and Duration min 1 x/week  1 week       Prognosis Prognosis for Safe Diet Advancement: Good      Swallow Study   General Date of Onset: 07/30/19 HPI: Abigail Moreno, 89y/f, presented to ER after a syncopal episode with persistent weakness of left upper and lower extremity. PMH of  Chronic lylmphocytic leukemia, Dyspnea, hyperlipemia, hypertension, macular degeneration of both eyes, osteoprosis, breast cancer, anxiety.  Contusion around nasal bridge from fall. 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 No hemorrhage or mass effect. Patient failed Trevor Mace Screen in ER.  Type of Study: Bedside Swallow Evaluation Previous Swallow Assessment: failed [facility] Swallow  Screen 07/30/19 Diet Prior to this Study: NPO Temperature Spikes Noted: No Respiratory Status: Room air History of Recent Intubation: No Behavior/Cognition: Alert;Cooperative;Pleasant mood Oral Cavity Assessment: Within Functional Limits Oral Care Completed by SLP: Yes Oral Cavity - Dentition: Adequate natural dentition Vision: Functional for self-feeding Self-Feeding Abilities: Able to feed self Patient Positioning: Upright in bed Baseline Vocal Quality: Normal Volitional Cough: Strong Volitional Swallow: Able to elicit    Oral/Motor/Sensory Function Overall Oral Motor/Sensory Function: Mild impairment Facial ROM: Reduced left Facial Symmetry: Within Functional Limits Facial Strength: Within Functional Limits Facial Sensation: Within Functional Limits Lingual ROM: Within Functional Limits Lingual Symmetry: Within Functional Limits Lingual Strength: Within Functional Limits Lingual Sensation: Within Functional Limits Velum: Within Functional Limits Mandible: Within Functional Limits   Ice Chips Ice chips: Within functional limits Presentation: Spoon   Thin Liquid Thin Liquid: Within functional limits Presentation: Cup;Straw    Nectar Thick Nectar Thick Liquid: Not tested   Honey Thick Honey Thick Liquid: Not tested   Puree Puree: Within functional limits Presentation: Spoon   Solid     Solid: Within functional limits Presentation: Self Phineas Semen., MA CCC-SLP 07/31/2019,11:27 AM

## 2019-07-31 NOTE — Progress Notes (Signed)
PROGRESS NOTE    Abigail Moreno  [id number] DOB: Jan 19, 1929 DOA: 07/30/2019 PCP: Mayra Neer, MD   Brief Narrative:  Per HPI: Abigail Moreno is a 85 y.o. female with history of CLL under observation, hypertension takes medication only as necessary, [name]  Patient started noticing weakness of the left upper and lower extremity yesterday morning July 29, 2019.  Patient had gone to her dentist appointment with her daughter and was noticed to have some gait difficulties and patient did not want to come to the ER.  This morning when patient woke up from sleep she was trying to walk to get a drink when patient lost consciousness and passed out and fell onto her face.  Patient was brought to the ER.  11/12: Patient noted to have acute infarct of the superior right pons with resultant left-sided hemiparesis.  Work-up still pending with neurology evaluation appreciated.  PT/OT evaluation ongoing.  With recommendations so far for CIR.  Assessment & Plan:   Principal Problem:   Acute CVA (cerebrovascular accident) ([place]) Active Problems:   CLL (chronic lymphocytic leukemia) ([place])   Breast cancer ([facility])   Thrombocytopenia ([place])   Essential hypertension   1. Acute right superior pons CVA with noted left-sided hemiparesis.  Appreciate neurology evaluation.  2D echocardiogram pending.  LDL noted to be 130 and A1c is 5.0%.  Continue full dose aspirin.  Patient noted to be allergic to statins. 2. Syncope -not sure if patient passed out after the fall.  Patient was feeling weak on walking.  Will closely monitor in telemetry check 2D echo.  Check orthostatics. 3. History of hypertension -patient takes blood pressure medication only if needed.  For now will allow for permissive hypertension.  As needed labetalol for systolic  more than [id number] and diastolic more than [id number]. 4. History of CLL with chronic leukocytosis and thrombocytopenia follow CBC. 5. History of anxiety and depression presently takes as needed Xanax. 6. Nasal contusion after fall.   DVT prophylaxis: Lovenox Code Status: DNR Family Communication: Discussed with patient Disposition Plan: Per neurology.  Complete work-up with subsequent likely discharge to CIR.   Consultants:   Neurology  Procedures:   None  Antimicrobials:   None   Subjective: Patient seen and evaluated today with no new acute complaints or concerns. No acute concerns or events noted overnight.  Objective: Vitals:   07/30/19 2300 07/31/19 0300 07/31/19 0552 07/31/19 0729  BP: (!) 152/90 126/76 126/82 132/85  Pulse: 84 80 90 88  Resp: 20 16 16    Temp: 97.7 F (36.5 C) 98.1 F (36.7 C) 98.2 F (36.8 C) 97.8 F (36.6 C)  TempSrc: Oral Oral Oral Oral  SpO2: 94% 93% 92% 94%  Weight: 60.8 kg     Height: 5' (1.524 m)       Intake/Output Summary (Last 24 hours) at 07/31/2019 0956 Last data filed at 07/31/2019 0900 Gross per 24 hour  Intake 568.09 ml  Output --  Net 568.09 ml   Filed Weights   07/30/19 2300  Weight: 60.8 kg    Examination:  General exam: Appears calm and comfortable  Respiratory system: Clear to auscultation. Respiratory effort normal. Cardiovascular system: S1 & S2 heard, RRR. No JVD, murmurs, rubs, gallops or clicks. No pedal edema. Gastrointestinal system: Abdomen is nondistended, soft and nontender. No organomegaly or masses felt. Normal  bowel sounds heard. Central nervous system: Alert and oriented.  Full examination cannot be completed at this time as OT is working with patient. Extremities: No significant edema Skin: No rashes, lesions or ulcers Psychiatry: Judgement and insight appear normal. Mood & affect appropriate.     Data Reviewed: I have personally reviewed following labs and imaging studies  CBC: Recent Labs  Lab  07/30/19 1005 07/30/19 1802 07/30/19 1827  WBC 36.9* 38.4*  --   NEUTROABS  --  19.2*  --   HGB 14.7 14.6 15.3*  HCT 45.9 44.8 45.0  MCV 93.9 93.5  --   PLT 106* 111*  --    Basic Metabolic Panel: Recent Labs  Lab 07/30/19 1005 07/30/19 1827  NA 139 140  K 4.2 4.4  CL 105 104  CO2 24  --   GLUCOSE 100* 101*  BUN 7* 9  CREATININE 0.63 0.50  CALCIUM 9.2  --    GFR: Estimated Creatinine Clearance: 39.6 mL/min (by C-G formula based on SCr of 0.5 mg/dL). Liver Function Tests: No results for input(s): AST, ALT, ALKPHOS, BILITOT, PROT, ALBUMIN in the last 168 hours. No results for input(s): LIPASE, AMYLASE in the last 168 hours. No results for input(s): AMMONIA in the last 168 hours. Coagulation Profile: Recent Labs  Lab 07/30/19 1802  INR 1.1   Cardiac Enzymes: No results for input(s): CKTOTAL, CKMB, CKMBINDEX, TROPONINI in the last 168 hours. BNP (last 3 results) No results for input(s): PROBNP in the last 8760 hours. HbA1C: Recent Labs    07/31/19 0547  HGBA1C 5.0   CBG: Recent Labs  Lab 07/31/19 0639  GLUCAP 90   Lipid Profile: Recent Labs    07/31/19 0547  CHOL 188  HDL 45  LDLCALC 130*  TRIG 67  CHOLHDL 4.2   Thyroid Function Tests: No results for input(s): TSH, T4TOTAL, FREET4, T3FREE, THYROIDAB in the last 72 hours. Anemia Panel: No results for input(s): VITAMINB12, FOLATE, FERRITIN, TIBC, IRON, RETICCTPCT in the last 72 hours. Sepsis Labs: No results for input(s): PROCALCITON, LATICACIDVEN in the last 168 hours.  Recent Results (from the past 240 hour(s))  SARS CORONAVIRUS 2 (TAT 6-24 HRS) Nasopharyngeal Nasopharyngeal Swab     Status: None   Collection Time: 07/30/19  8:27 PM   Specimen: Nasopharyngeal Swab  Result Value Ref Range Status   SARS Coronavirus 2 NEGATIVE NEGATIVE Final    Comment: (NOTE) SARS-CoV-2 target nucleic acids are NOT DETECTED. The SARS-CoV-2 RNA is generally detectable in upper and lower respiratory specimens  during the acute phase of infection. Negative results do not preclude SARS-CoV-2 infection, do not rule out co-infections with other pathogens, and should not be used as the sole basis for treatment or other patient management decisions. Negative results must be combined with clinical observations, patient history, and epidemiological information. The expected result is Negative. Fact Sheet for Patients: SugarRoll.be Fact Sheet for Healthcare Providers: https://www.woods-mathews.com/ This test is not yet approved or cleared by the [place] FDA and  has been authorized for detection and/or diagnosis of SARS-CoV-2 by FDA under an Emergency Use Authorization (EUA). This EUA will remain  in effect (meaning this test can be used) for the duration of the COVID-19 declaration under Section 56 4(b)(1) of the Act, 21 U.S.C. section 360bbb-3(b)(1), unless the authorization is terminated or revoked sooner. Performed at [facility], [place] [address] [place], [place] [address]          Radiology Studies: Ct Angio Head W Or Wo  Contrast  Result Date: 07/30/2019 CLINICAL DATA:  Dizziness and syncope. Head trauma. EXAM: CT ANGIOGRAPHY HEAD AND NECK TECHNIQUE: Multidetector CT imaging of the head and neck was performed using the standard protocol during bolus administration of intravenous contrast. Multiplanar CT image reconstructions and MIPs were obtained to evaluate the vascular anatomy. Carotid stenosis measurements (when applicable) are obtained utilizing NASCET criteria, using the distal internal carotid diameter as the denominator. CONTRAST:  40mL OMNIPAQUE IOHEXOL 350 MG/ML SOLN COMPARISON:  None. FINDINGS: CTA NECK FINDINGS SKELETON: There is no bony spinal canal stenosis. No lytic or blastic lesion. OTHER NECK: Normal pharynx, larynx and major salivary glands. No cervical lymphadenopathy. Unremarkable thyroid gland. UPPER CHEST: No  pneumothorax or pleural effusion. No nodules or masses. AORTIC ARCH: There is mild calcific atherosclerosis of the aortic arch. There is no aneurysm, dissection or hemodynamically significant stenosis of the visualized portion of the aorta. Conventional 3 vessel aortic branching pattern. The visualized proximal subclavian arteries are widely patent. RIGHT CAROTID SYSTEM: No dissection, occlusion or aneurysm. There is mixed density atherosclerosis extending into the proximal ICA, resulting in less than 50% stenosis. LEFT CAROTID SYSTEM: Normal without aneurysm, dissection or stenosis. VERTEBRAL ARTERIES: Right dominant configuration. Both origins are clearly patent. There is no dissection, occlusion or flow-limiting stenosis to the skull base (V1-V3 segments). CTA HEAD FINDINGS POSTERIOR CIRCULATION: --Vertebral arteries: Normal V4 segments. --Posterior inferior cerebellar arteries (PICA): Patent origins from the vertebral arteries. --Anterior inferior cerebellar arteries (AICA): Patent origins from the basilar artery. --Basilar artery: Normal. --Superior cerebellar arteries: Normal. --Posterior cerebral arteries: Normal. Both originate from the basilar artery. Posterior communicating arteries (p-comm) are diminutive or absent. ANTERIOR CIRCULATION: --Intracranial internal carotid arteries: Normal. --Anterior cerebral arteries (ACA): Normal. Both A1 segments are present. Patent anterior communicating artery (a-comm). --Middle cerebral arteries (MCA): Normal. VENOUS SINUSES: As permitted by contrast timing, patent. ANATOMIC VARIANTS: None Review of the MIP images confirms the above findings. IMPRESSION: 1. No emergent large vessel occlusion or high-grade stenosis of the intracranial arteries. 2. No dissection, aneurysm or hemodynamically significant stenosis of the carotid or vertebral arteries. 3. Aortic Atherosclerosis (ICD10-I70.0). Electronically Signed   By: Ulyses Jarred M.D.   On: 07/30/2019 22:16   Dg Chest  2 View  Result Date: 07/30/2019 CLINICAL DATA:  Left-sided chest pain. EXAM: CHEST - 2 VIEW COMPARISON:  02/28/2017 FINDINGS: The heart size and mediastinal contours are within normal limits. Both lungs are clear. The visualized skeletal structures are unremarkable. Aortic atherosclerosis. IMPRESSION: 1. No active cardiopulmonary disease. 2. Aortic atherosclerosis. Electronically Signed   By: Lorriane Shire M.D.   On: 07/30/2019 18:50   Ct Head Wo Contrast  Result Date: 07/30/2019 CLINICAL DATA:  Status post fall with a blow to the head. Initial encounter. EXAM: CT HEAD WITHOUT CONTRAST CT MAXILLOFACIAL WITHOUT CONTRAST TECHNIQUE: Multidetector CT imaging of the head and maxillofacial structures were performed using the standard protocol without intravenous contrast. Multiplanar CT image reconstructions of the maxillofacial structures were also generated. COMPARISON:  None. FINDINGS: CT HEAD FINDINGS Brain: No evidence of acute infarction, hemorrhage, hydrocephalus, extra-axial collection or mass lesion/mass effect. Chronic microvascular ischemic change noted. Vascular: No hyperdense vessel or unexpected calcification. Skull: Intact.  No focal lesion. Other: Soft tissue contusion on the forehead is seen. CT MAXILLOFACIAL FINDINGS Osseous: No fracture or mandibular dislocation. No destructive process. Orbits: Negative. No traumatic or inflammatory finding. Status post cataract surgery. Sinuses: Tiny mucous retention cyst or polyp right maxillary sinus is noted. Trace bilateral mastoid effusions. Soft tissues: Negative. IMPRESSION:  Soft tissue contusion on the forehead without underlying fracture. No other acute abnormality head or face. Chronic microvascular ischemic change. Electronically Signed   By: Inge Rise M.D.   On: 07/30/2019 11:18   Ct Angio Neck W Or Wo Contrast  Result Date: 07/30/2019 CLINICAL DATA:  Dizziness and syncope. Head trauma. EXAM: CT ANGIOGRAPHY HEAD AND NECK TECHNIQUE:  Multidetector CT imaging of the head and neck was performed using the standard protocol during bolus administration of intravenous contrast. Multiplanar CT image reconstructions and MIPs were obtained to evaluate the vascular anatomy. Carotid stenosis measurements (when applicable) are obtained utilizing NASCET criteria, using the distal internal carotid diameter as the denominator. CONTRAST:  62mL OMNIPAQUE IOHEXOL 350 MG/ML SOLN COMPARISON:  None. FINDINGS: CTA NECK FINDINGS SKELETON: There is no bony spinal canal stenosis. No lytic or blastic lesion. OTHER NECK: Normal pharynx, larynx and major salivary glands. No cervical lymphadenopathy. Unremarkable thyroid gland. UPPER CHEST: No pneumothorax or pleural effusion. No nodules or masses. AORTIC ARCH: There is mild calcific atherosclerosis of the aortic arch. There is no aneurysm, dissection or hemodynamically significant stenosis of the visualized portion of the aorta. Conventional 3 vessel aortic branching pattern. The visualized proximal subclavian arteries are widely patent. RIGHT CAROTID SYSTEM: No dissection, occlusion or aneurysm. There is mixed density atherosclerosis extending into the proximal ICA, resulting in less than 50% stenosis. LEFT CAROTID SYSTEM: Normal without aneurysm, dissection or stenosis. VERTEBRAL ARTERIES: Right dominant configuration. Both origins are clearly patent. There is no dissection, occlusion or flow-limiting stenosis to the skull base (V1-V3 segments). CTA HEAD FINDINGS POSTERIOR CIRCULATION: --Vertebral arteries: Normal V4 segments. --Posterior inferior cerebellar arteries (PICA): Patent origins from the vertebral arteries. --Anterior inferior cerebellar arteries (AICA): Patent origins from the basilar artery. --Basilar artery: Normal. --Superior cerebellar arteries: Normal. --Posterior cerebral arteries: Normal. Both originate from the basilar artery. Posterior communicating arteries (p-comm) are diminutive or absent.  ANTERIOR CIRCULATION: --Intracranial internal carotid arteries: Normal. --Anterior cerebral arteries (ACA): Normal. Both A1 segments are present. Patent anterior communicating artery (a-comm). --Middle cerebral arteries (MCA): Normal. VENOUS SINUSES: As permitted by contrast timing, patent. ANATOMIC VARIANTS: None Review of the MIP images confirms the above findings. IMPRESSION: 1. No emergent large vessel occlusion or high-grade stenosis of the intracranial arteries. 2. No dissection, aneurysm or hemodynamically significant stenosis of the carotid or vertebral arteries. 3. Aortic Atherosclerosis (ICD10-I70.0). Electronically Signed   By: Ulyses Jarred M.D.   On: 07/30/2019 22:16   Mr Angio Head Wo Contrast  Result Date: 07/31/2019 CLINICAL DATA:  Leukemia. Left-sided weakness. EXAM: MRI HEAD WITHOUT CONTRAST MRA HEAD WITHOUT CONTRAST TECHNIQUE: Multiplanar, multiecho pulse sequences of the brain and surrounding structures were obtained without intravenous contrast. Angiographic images of the head were obtained using MRA technique without contrast. COMPARISON:  None. FINDINGS: MRI HEAD FINDINGS BRAIN: Small acute infarct of the superior right pons. Early confluent hyperintense T2-weighted signal of the periventricular and deep white matter, most commonly due to chronic ischemic microangiopathy. The cerebral and cerebellar volume are age-appropriate. There is no hydrocephalus. The midline structures are normal. VASCULAR: The major intracranial arterial and venous sinus flow voids are normal. Susceptibility-sensitive sequences show no chronic microhemorrhage or superficial siderosis. SKULL AND UPPER CERVICAL SPINE: Calvarial bone marrow signal is normal. There is no skull base mass. The visualized upper cervical spine and soft tissues are normal. SINUSES/ORBITS: There are no fluid levels or advanced mucosal thickening. The mastoid air cells and middle ear cavities are free of fluid. The orbits are normal. MRA  HEAD  FINDINGS POSTERIOR CIRCULATION: --Vertebral arteries: Normal V4 segments. --Posterior inferior cerebellar arteries (PICA): Patent origins from the vertebral arteries. --Anterior inferior cerebellar arteries (AICA): Patent origins from the basilar artery. --Basilar artery: Normal. --Superior cerebellar arteries: Normal. --Posterior cerebral arteries: Normal. Both originate from the basilar artery. Posterior communicating arteries (p-comm) are diminutive or absent. ANTERIOR CIRCULATION: --Intracranial internal carotid arteries: Normal. --Anterior cerebral arteries (ACA): Normal. Both A1 segments are present. Patent anterior communicating artery (a-comm). --Middle cerebral arteries (MCA): Normal. IMPRESSION: 1. 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 No hemorrhage or mass effect. 2. Normal intracranial MRA. 3. Chronic ischemic microangiopathy. Electronically Signed   By: Ulyses Jarred M.D.   On: 07/31/2019 00:29   Mr Brain Wo Contrast (neuro Protocol)  Result Date: 07/31/2019 CLINICAL DATA:  Leukemia. Left-sided weakness. EXAM: MRI HEAD WITHOUT CONTRAST MRA HEAD WITHOUT CONTRAST TECHNIQUE: Multiplanar, multiecho pulse sequences of the brain and surrounding structures were obtained without intravenous contrast. Angiographic images of the head were obtained using MRA technique without contrast. COMPARISON:  None. FINDINGS: MRI HEAD FINDINGS BRAIN: Small acute infarct of the superior right pons. Early confluent hyperintense T2-weighted signal of the periventricular and deep white matter, most commonly due to chronic ischemic microangiopathy. The cerebral and cerebellar volume are age-appropriate. There is no hydrocephalus. The midline structures are normal. VASCULAR: The major intracranial arterial and venous sinus flow voids are normal. Susceptibility-sensitive sequences show no chronic microhemorrhage or superficial siderosis. SKULL AND UPPER CERVICAL SPINE: Calvarial bone marrow signal is normal.  There is no skull base mass. The visualized upper cervical spine and soft tissues are normal. SINUSES/ORBITS: There are no fluid levels or advanced mucosal thickening. The mastoid air cells and middle ear cavities are free of fluid. The orbits are normal. MRA HEAD FINDINGS POSTERIOR CIRCULATION: --Vertebral arteries: Normal V4 segments. --Posterior inferior cerebellar arteries (PICA): Patent origins from the vertebral arteries. --Anterior inferior cerebellar arteries (AICA): Patent origins from the basilar artery. --Basilar artery: Normal. --Superior cerebellar arteries: Normal. --Posterior cerebral arteries: Normal. Both originate from the basilar artery. Posterior communicating arteries (p-comm) are diminutive or absent. ANTERIOR CIRCULATION: --Intracranial internal carotid arteries: Normal. --Anterior cerebral arteries (ACA): Normal. Both A1 segments are present. Patent anterior communicating artery (a-comm). --Middle cerebral arteries (MCA): Normal. IMPRESSION: 1. 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 No hemorrhage or mass effect. 2. Normal intracranial MRA. 3. Chronic ischemic microangiopathy. Electronically Signed   By: Ulyses Jarred M.D.   On: 07/31/2019 00:29   Ct Maxillofacial Wo Contrast  Result Date: 07/30/2019 CLINICAL DATA:  Status post fall with a blow to the head. Initial encounter. EXAM: CT HEAD WITHOUT CONTRAST CT MAXILLOFACIAL WITHOUT CONTRAST TECHNIQUE: Multidetector CT imaging of the head and maxillofacial structures were performed using the standard protocol without intravenous contrast. Multiplanar CT image reconstructions of the maxillofacial structures were also generated. COMPARISON:  None. FINDINGS: CT HEAD FINDINGS Brain: No evidence of acute infarction, hemorrhage, hydrocephalus, extra-axial collection or mass lesion/mass effect. Chronic microvascular ischemic change noted. Vascular: No hyperdense vessel or unexpected calcification. Skull: Intact.  No focal lesion. Other:  Soft tissue contusion on the forehead is seen. CT MAXILLOFACIAL FINDINGS Osseous: No fracture or mandibular dislocation. No destructive process. Orbits: Negative. No traumatic or inflammatory finding. Status post cataract surgery. Sinuses: Tiny mucous retention cyst or polyp right maxillary sinus is noted. Trace bilateral mastoid effusions. Soft tissues: Negative. IMPRESSION: Soft tissue contusion on the forehead without underlying fracture. No other acute abnormality head or face. Chronic microvascular ischemic change. Electronically Signed  By: Inge Rise M.D.   On: 07/30/2019 11:18        Scheduled Meds:  aspirin  300 mg Rectal Daily   Or   aspirin  325 mg Oral Daily   enoxaparin (LOVENOX) injection  40 mg Subcutaneous Q24H   Continuous Infusions:  sodium chloride 75 mL/hr at 07/31/19 0900     LOS: 1 day    Time spent: 30 minutes    Azhar Knope Darleen Crocker, DO [facility] Pager [phone]  If 7PM-7AM, please contact night-coverage [email or web address] Password [facility] 07/31/2019, 9:56 AM

## 2019-07-31 NOTE — Progress Notes (Signed)
  Echocardiogram 2D Echocardiogram has been performed.  Abigail Moreno M 07/31/2019, 10:20 AM

## 2019-08-01 ENCOUNTER — Inpatient Hospital Stay (HOSPITAL_COMMUNITY)
Admission: RE | Admit: 2019-08-01 | Discharge: 2019-08-27 | DRG: 057 | Disposition: A | Discharge status: Home Health | Payer: PPO | Source: Intra-hospital | Attending: Physical Medicine & Rehabilitation | Admitting: Physical Medicine & Rehabilitation

## 2019-08-01 DIAGNOSIS — I6350 Cerebral infarction due to unspecified occlusion or stenosis of unspecified cerebral artery: Secondary | ICD-10-CM | POA: Diagnosis not present

## 2019-08-01 DIAGNOSIS — Z8249 Family history of ischemic heart disease and other diseases of the circulatory system: Secondary | ICD-10-CM

## 2019-08-01 DIAGNOSIS — Z90710 Acquired absence of both cervix and uterus: Secondary | ICD-10-CM | POA: Diagnosis not present

## 2019-08-01 DIAGNOSIS — D62 Acute posthemorrhagic anemia: Secondary | ICD-10-CM | POA: Diagnosis not present

## 2019-08-01 DIAGNOSIS — R600 Localized edema: Secondary | ICD-10-CM | POA: Diagnosis present

## 2019-08-01 DIAGNOSIS — M25511 Pain in right shoulder: Secondary | ICD-10-CM | POA: Diagnosis not present

## 2019-08-01 DIAGNOSIS — R062 Wheezing: Secondary | ICD-10-CM | POA: Diagnosis not present

## 2019-08-01 DIAGNOSIS — S0083XA Contusion of other part of head, initial encounter: Secondary | ICD-10-CM | POA: Diagnosis not present

## 2019-08-01 DIAGNOSIS — D696 Thrombocytopenia, unspecified: Secondary | ICD-10-CM | POA: Diagnosis not present

## 2019-08-01 DIAGNOSIS — F419 Anxiety disorder, unspecified: Secondary | ICD-10-CM | POA: Diagnosis not present

## 2019-08-01 DIAGNOSIS — I69390 Apraxia following cerebral infarction: Secondary | ICD-10-CM

## 2019-08-01 DIAGNOSIS — E785 Hyperlipidemia, unspecified: Secondary | ICD-10-CM | POA: Diagnosis present

## 2019-08-01 DIAGNOSIS — I639 Cerebral infarction, unspecified: Secondary | ICD-10-CM | POA: Diagnosis present

## 2019-08-01 DIAGNOSIS — F329 Major depressive disorder, single episode, unspecified: Secondary | ICD-10-CM | POA: Diagnosis not present

## 2019-08-01 DIAGNOSIS — M810 Age-related osteoporosis without current pathological fracture: Secondary | ICD-10-CM | POA: Diagnosis present

## 2019-08-01 DIAGNOSIS — E8809 Other disorders of plasma-protein metabolism, not elsewhere classified: Secondary | ICD-10-CM | POA: Diagnosis not present

## 2019-08-01 DIAGNOSIS — S0083XD Contusion of other part of head, subsequent encounter: Secondary | ICD-10-CM

## 2019-08-01 DIAGNOSIS — K5901 Slow transit constipation: Secondary | ICD-10-CM | POA: Diagnosis present

## 2019-08-01 DIAGNOSIS — Z90721 Acquired absence of ovaries, unilateral: Secondary | ICD-10-CM

## 2019-08-01 DIAGNOSIS — Z9012 Acquired absence of left breast and nipple: Secondary | ICD-10-CM | POA: Diagnosis not present

## 2019-08-01 DIAGNOSIS — F411 Generalized anxiety disorder: Secondary | ICD-10-CM | POA: Diagnosis not present

## 2019-08-01 DIAGNOSIS — G4700 Insomnia, unspecified: Secondary | ICD-10-CM | POA: Diagnosis present

## 2019-08-01 DIAGNOSIS — R058 Other specified cough: Secondary | ICD-10-CM

## 2019-08-01 DIAGNOSIS — Z853 Personal history of malignant neoplasm of breast: Secondary | ICD-10-CM

## 2019-08-01 DIAGNOSIS — I69354 Hemiplegia and hemiparesis following cerebral infarction affecting left non-dominant side: Principal | ICD-10-CM

## 2019-08-01 DIAGNOSIS — I10 Essential (primary) hypertension: Secondary | ICD-10-CM | POA: Diagnosis not present

## 2019-08-01 DIAGNOSIS — M62838 Other muscle spasm: Secondary | ICD-10-CM

## 2019-08-01 DIAGNOSIS — R252 Cramp and spasm: Secondary | ICD-10-CM | POA: Diagnosis not present

## 2019-08-01 DIAGNOSIS — C9110 Chronic lymphocytic leukemia of B-cell type not having achieved remission: Secondary | ICD-10-CM | POA: Diagnosis present

## 2019-08-01 DIAGNOSIS — Z87891 Personal history of nicotine dependence: Secondary | ICD-10-CM

## 2019-08-01 DIAGNOSIS — Z20828 Contact with and (suspected) exposure to other viral communicable diseases: Secondary | ICD-10-CM | POA: Diagnosis present

## 2019-08-01 DIAGNOSIS — R0989 Other specified symptoms and signs involving the circulatory and respiratory systems: Secondary | ICD-10-CM | POA: Diagnosis not present

## 2019-08-01 DIAGNOSIS — Z888 Allergy status to other drugs, medicaments and biological substances status: Secondary | ICD-10-CM

## 2019-08-01 DIAGNOSIS — R609 Edema, unspecified: Secondary | ICD-10-CM

## 2019-08-01 DIAGNOSIS — G479 Sleep disorder, unspecified: Secondary | ICD-10-CM | POA: Diagnosis not present

## 2019-08-01 DIAGNOSIS — L8990 Pressure ulcer of unspecified site, unspecified stage: Secondary | ICD-10-CM | POA: Insufficient documentation

## 2019-08-01 DIAGNOSIS — Z856 Personal history of leukemia: Secondary | ICD-10-CM | POA: Diagnosis not present

## 2019-08-01 DIAGNOSIS — R05 Cough: Secondary | ICD-10-CM | POA: Diagnosis not present

## 2019-08-01 DIAGNOSIS — H3530 Unspecified macular degeneration: Secondary | ICD-10-CM | POA: Diagnosis not present

## 2019-08-01 DIAGNOSIS — Z79899 Other long term (current) drug therapy: Secondary | ICD-10-CM

## 2019-08-01 DIAGNOSIS — Z7901 Long term (current) use of anticoagulants: Secondary | ICD-10-CM

## 2019-08-01 DIAGNOSIS — Z96643 Presence of artificial hip joint, bilateral: Secondary | ICD-10-CM | POA: Diagnosis present

## 2019-08-01 DIAGNOSIS — Z882 Allergy status to sulfonamides status: Secondary | ICD-10-CM

## 2019-08-01 LAB — CBC
HCT: 39.7 % (ref 36.0–46.0)
Hemoglobin: 12.9 g/dL (ref 12.0–15.0)
MCH: 30.3 pg (ref 26.0–34.0)
MCHC: 32.5 g/dL (ref 30.0–36.0)
MCV: 93.2 fL (ref 80.0–100.0)
Platelets: 93 10*3/uL — ABNORMAL LOW (ref 150–400)
RBC: 4.26 MIL/uL (ref 3.87–5.11)
RDW: 13.9 % (ref 11.5–15.5)
WBC: 33 10*3/uL — ABNORMAL HIGH (ref 4.0–10.5)
nRBC: 0 % (ref 0.0–0.2)

## 2019-08-01 LAB — BASIC METABOLIC PANEL
Anion gap: 11 (ref 5–15)
BUN: 7 mg/dL — ABNORMAL LOW (ref 8–23)
CO2: 24 mmol/L (ref 22–32)
Calcium: 8.4 mg/dL — ABNORMAL LOW (ref 8.9–10.3)
Chloride: 103 mmol/L (ref 98–111)
Creatinine, Ser: 0.51 mg/dL (ref 0.44–1.00)
GFR calc Af Amer: >60 mL/min (ref >60)
GFR calc non Af Amer: >60 mL/min (ref >60)
Glucose, Bld: 86 mg/dL (ref 70–99)
Potassium: 3.6 mmol/L (ref 3.5–5.1)
Sodium: 138 mmol/L (ref 135–145)

## 2019-08-01 LAB — GLUCOSE, CAPILLARY
Glucose-Capillary: 83 mg/dL (ref 70–99)
Glucose-Capillary: 92 mg/dL (ref 70–99)
Glucose-Capillary: 84 mg/dL (ref 70–99)

## 2019-08-01 MED ORDER — CLOPIDOGREL BISULFATE 75 MG PO TABS: 75.0000 mg | ORAL_TABLET | Freq: Every day | ORAL | 0 refills | Status: DC

## 2019-08-01 MED ORDER — ACETAMINOPHEN 160 MG/5ML PO SOLN: 650.0000 mg | ORAL | Status: DC | PRN

## 2019-08-01 MED ORDER — ENOXAPARIN SODIUM 40 MG/0.4ML ~~LOC~~ SOLN
40.0000 mg | SUBCUTANEOUS | Status: DC
  Administered 2019-08-02 – 2019-08-26 (×24): 40 mg via SUBCUTANEOUS
  Filled 2019-08-01 (×23): qty 0.4

## 2019-08-01 MED ORDER — ENOXAPARIN SODIUM 40 MG/0.4ML ~~LOC~~ SOLN: 40.0000 mg | SUBCUTANEOUS | Status: DC

## 2019-08-01 MED ORDER — SORBITOL 70 % SOLN
30.0000 mL | Freq: Every day | Status: DC | PRN
  Administered 2019-08-03 – 2019-08-10 (×3): 30 mL via ORAL
  Filled 2019-08-01 (×3): qty 30

## 2019-08-01 MED ORDER — ACETAMINOPHEN 325 MG PO TABS
650.0000 mg | ORAL_TABLET | ORAL | Status: DC | PRN
  Administered 2019-08-01 – 2019-08-27 (×29): 650 mg via ORAL
  Filled 2019-08-01 (×28): qty 2

## 2019-08-01 MED ORDER — ASPIRIN EC 81 MG PO TBEC
81.0000 mg | DELAYED_RELEASE_TABLET | Freq: Every day | ORAL | Status: DC
  Administered 2019-08-02 – 2019-08-27 (×26): 81 mg via ORAL
  Filled 2019-08-01 (×26): qty 1

## 2019-08-01 MED ORDER — HYDROCODONE-ACETAMINOPHEN 7.5-325 MG PO TABS: 1.0000 | ORAL_TABLET | ORAL | 0 refills | Status: DC | PRN

## 2019-08-01 MED ORDER — ALPRAZOLAM 0.25 MG PO TABS
0.2500 mg | ORAL_TABLET | Freq: Four times a day (QID) | ORAL | Status: DC | PRN
  Administered 2019-08-01 – 2019-08-08 (×8): 0.25 mg via ORAL
  Filled 2019-08-01 (×8): qty 1

## 2019-08-01 MED ORDER — ALPRAZOLAM 0.25 MG PO TABS: 0.2500 mg | ORAL_TABLET | Freq: Four times a day (QID) | ORAL | 0 refills | Status: DC | PRN

## 2019-08-01 MED ORDER — CLOPIDOGREL BISULFATE 75 MG PO TABS
75.0000 mg | ORAL_TABLET | Freq: Every day | ORAL | Status: AC
  Administered 2019-08-02 – 2019-08-20 (×19): 75 mg via ORAL
  Filled 2019-08-01 (×19): qty 1

## 2019-08-01 MED ORDER — ASPIRIN 81 MG PO TBEC: 81.0000 mg | DELAYED_RELEASE_TABLET | Freq: Every day | ORAL | 3 refills | Status: AC

## 2019-08-01 MED ORDER — ACETAMINOPHEN 650 MG RE SUPP: 650.0000 mg | RECTAL | Status: DC | PRN

## 2019-08-01 NOTE — Progress Notes (Signed)
Pt was admitted to 4W-15. Pt arrived with daughter. All questions were answered. Pt is resting in bed with call bell in reach, bed alarm on and states no pain. Doy Hutching, LPN

## 2019-08-01 NOTE — Evaluation (Signed)
Speech Language Pathology Evaluation Patient Details Name: Abigail Moreno MRN: [id number] DOB: 05-28-1929 Today's Date: 08/01/2019 Time: [id number] SLP Time Calculation (min) (ACUTE ONLY): 23 min  Problem List:  Patient Active Problem List   Diagnosis Date Noted  . Acute CVA (cerebrovascular accident) ([place]) 07/30/2019  . Essential hypertension 07/30/2019  . Overweight (BMI 25.0-29.9) 09/18/2018  . S/P left THA, AA 09/17/2018  . Environmental allergies 07/19/2016  . History of left breast cancer 10/23/2015  . Status post hysterectomy 06/15/2015  . Unresolved grief 06/15/2015  . Thrombocytopenia ([place]) 06/15/2015  . Need for prophylactic vaccination and inoculation against influenza 06/15/2015  . Mixed hyperlipidemia 03/16/2015  . Osteopenia 03/16/2015  . Osteoarthritis 03/16/2015  . Vaginal atrophy 03/16/2015  . Papanicolaou smear for cervical cancer screening 03/16/2015  . Macular degeneration 01/07/2015  . CLL (chronic lymphocytic leukemia) ([place]) 11/18/2011  . Breast cancer ([place]) 11/18/2011   Past Medical History:  Past Medical History:  Diagnosis Date  . Anxiety   . Arthritis of knee  FALL 2012   RIGHT KNEE, hips, and fingers  . Breast cancer ([place]) FEBRUARY 2004   T1, NO, ER/PR POSITIVE LEFT BREAST  . CLL (chronic lymphocytic leukemia) ([place]) 2004   Dr. Marko Plume is oncologist  . Dyspnea    occ sob with exertion  . History of blood transfusion    with first and maybe second delivery  . Hyperlipemia   . Hypertension   . Macular degeneration    both eyes  . Osteoporosis    borderline  . UTI (urinary tract infection)    on nitrofuratonin bid x 7 days started 09-12-18 am   Past Surgical History:  Past Surgical History:  Procedure Laterality Date  . ABDOMINAL HYSTERECTOMY  1972   UNILATERAL OOPHORECTOMY( LEFT)  . APPENDECTOMY    . EYE SURGERY Bilateral    cataracts  . INCONTINENCE SURGERY    . MASTECTOMY PARTIAL / LUMPECTOMY W/ AXILLARY LYMPHADENECTOMY   FEBRUARY 2004   LEFT BREAST  . OTHER SURGICAL HISTORY     cysts removed from breasts   . TOTAL HIP ARTHROPLASTY  12/26/2011   Procedure: TOTAL HIP ARTHROPLASTY ANTERIOR APPROACH;  Surgeon: Mauri Pole, MD;  Location: [facility];  Service: Orthopedics;  Laterality: Right;  . TOTAL HIP ARTHROPLASTY Left 09/17/2018   Procedure: TOTAL HIP ARTHROPLASTY ANTERIOR APPROACH;  Surgeon: Paralee Cancel, MD;  Location: [facility];  Service: Orthopedics;  Laterality: Left;  70 mins   HPI:  Abigail Moreno, 89y/f, presented to ER after a syncopal episode with persistent weakness of left upper and lower extremity. PMH of Chronic lylmphocytic leukemia, Dyspnea, hyperlipemia, hypertension, macular degeneration of both eyes, osteoprosis, breast cancer, anxiety.  Contusion around nasal bridge from fall. 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 No hemorrhage or mass effect. Patient failed Trevor Mace Screen in ER.    Assessment / Plan / Recommendation Clinical Impression   Pt presents with mild dysarthria resulting from subtle left sided oral motor deficits which leads to imprecise articulation of consonants.  Pt is fully intelligible at the conversational level and she is pleased with the way her speech sounds.  Pt denies any word finding difficulty and verbal output was fluent and appropriate during conversations with therapist. Cognition was also grossly [facility] for functional tasks observed during today's evaluation.  Pt denies any overt cognitive changes.  As a result, no further ST needs are indicated at this time.      SLP Assessment  SLP Recommendation/Assessment: Patient  does not need any further Speech Lanaguage Pathology Services    Follow Up Recommendations  None    Frequency and Duration           SLP Evaluation Cognition  Overall Cognitive Status: Within Functional Limits for tasks assessed       Comprehension  Auditory Comprehension Overall Auditory Comprehension: Appears  within functional limits for tasks assessed    Expression Expression Primary Mode of Expression: Verbal Verbal Expression Overall Verbal Expression: Appears within functional limits for tasks assessed   Oral / Motor  Oral Motor/Sensory Function Overall Oral Motor/Sensory Function: Mild impairment Facial ROM: Reduced left Facial Symmetry: Within Functional Limits Facial Strength: Within Functional Limits Facial Sensation: Within Functional Limits Lingual ROM: Within Functional Limits Lingual Symmetry: Within Functional Limits Lingual Strength: Within Functional Limits Lingual Sensation: Within Functional Limits Velum: Within Functional Limits Mandible: Within Functional Limits Motor Speech Overall Motor Speech: Impaired Respiration: Within functional limits Phonation: Normal Resonance: Within functional limits Articulation: Impaired Level of Impairment: Conversation Intelligibility: Intelligible Motor Planning: Witnin functional limits   GO                    Emilio Math 08/01/2019, 9:31 AM

## 2019-08-01 NOTE — Progress Notes (Signed)
Physical Therapy Treatment Patient Details Name: Abigail Moreno MRN: [id number] DOB: 1929/03/04 Today's Date: 08/01/2019    History of Present Illness Patient is an 85 year old female with history of CLL under observation, hypertension takes medication only as necessary, [name] MRI reads 1. 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 No hemorrhage     PT Comments    Pt was able to stand with help but is quite weak, limited LUE support on RW.  Pt should be given platform or other device to support LUE, but did not have this available at the time of tx.  Follow up in CIR after today is expected but will reck her on WE if transfer to CIR is not done in keeping with post stroke protocol.  Follow Up Recommendations  CIR     Equipment Recommendations  None recommended by PT    Recommendations for Other Services Rehab consult     Precautions / Restrictions Precautions Precautions: Fall Precaution Comments: L hemiparesis Restrictions Weight Bearing Restrictions: No    Mobility  Bed Mobility Overal bed mobility: Needs Assistance Bed Mobility: Supine to Sit;Sit to Supine     Supine to sit: Mod assist Sit to supine: Mod assist   General bed mobility comments: mod to support legs but then could use LLE on bed for ex's  Transfers Overall transfer level: Needs assistance Equipment used: Rolling walker (2 wheeled) Transfers: Sit to/from Stand Sit to Stand: Min assist;Mod assist         General transfer comment: mod to power up and min to control LUE  Ambulation/Gait Ambulation/Gait assistance: Min assist Gait Distance (Feet): 6 Feet Assistive device: Rolling walker (2 wheeled);1 person hand held assist Gait Pattern/deviations: Step-to pattern;Decreased stride length;Wide base of support Gait velocity:  reduced Gait velocity interpretation: <1.8 ft/sec, indicate of risk for recurrent falls General Gait Details: sidesteps to L side at bed due to quick tendency to sit   Stairs             Wheelchair Mobility    Modified Rankin (Stroke Patients Only) Modified Rankin (Stroke Patients Only) Pre-Morbid Rankin Score: No significant disability Modified Rankin: Moderately severe disability     Balance Overall balance assessment: Needs assistance;History of Falls Sitting-balance support: Feet supported;Single extremity supported Sitting balance-Leahy Scale: Fair   Postural control: Posterior lean Standing balance support: Bilateral upper extremity supported;During functional activity Standing balance-Leahy Scale: Poor Standing balance comment: fatigued with use of LUE diminishing                            Cognition Arousal/Alertness: Awake/alert Behavior During Therapy: Flat affect Overall Cognitive Status: Within Functional Limits for tasks assessed                                 General Comments: able to follow instructions as her physical abilities allow her      Exercises General Exercises - Lower Extremity Long Arc Quad: Strengthening;10 reps Heel Slides: Strengthening;10 reps Hip ABduction/ADduction: Strengthening;10 reps Hip Flexion/Marching: AROM;10 reps    General Comments General comments (skin integrity, edema, etc.): pain in L ribcage only upon returning to bed from bedside      Pertinent Vitals/Pain Pain Assessment: Faces Faces Pain  Scale: Hurts little more Pain Location: ribs Pain Descriptors / Indicators: Grimacing;Guarding Pain Intervention(s): Limited activity within patient's tolerance;Monitored during session;Repositioned    Home Living                      Prior Function            PT Goals (current goals can now be found in the care plan section) Acute Rehab PT Goals Patient Stated Goal: get stronger  and get home Progress towards PT goals: Progressing toward goals    Frequency    Min 4X/week      PT Plan Current plan remains appropriate    Co-evaluation              AM-PAC PT "6 Clicks" Mobility   Outcome Measure  Help needed turning from your back to your side while in a flat bed without using bedrails?: A Little Help needed moving from lying on your back to sitting on the side of a flat bed without using bedrails?: A Lot Help needed moving to and from a bed to a chair (including a wheelchair)?: A Lot Help needed standing up from a chair using your arms (e.g., wheelchair or bedside chair)?: A Lot Help needed to walk in hospital room?: A Little Help needed climbing 3-5 steps with a railing? : Total 6 Click Score: 13    End of Session Equipment Utilized During Treatment: Gait belt Activity Tolerance: Patient limited by fatigue;Treatment limited secondary to medical complications (Comment) Patient left: in bed;with call bell/phone within reach;with family/visitor present;with nursing/sitter in room Nurse Communication: Mobility status PT Visit Diagnosis: Unsteadiness on feet (R26.81);Muscle weakness (generalized) (M62.81);Difficulty in walking, not elsewhere classified (R26.2);Hemiplegia and hemiparesis;Pain Hemiplegia - Right/Left: Left Hemiplegia - dominant/non-dominant: Non-dominant Hemiplegia - caused by: Cerebral infarction Pain - Right/Left: Left Pain - part of body: (ribcage)     Time: [id number] PT Time Calculation (min) (ACUTE ONLY): 25 min  Charges:  $Gait Training: 8-22 mins $Therapeutic Exercise: 8-22 mins                    Ramond Dial 08/01/2019, 5:40 PM   Mee Hives, PT MS Acute Rehab Dept. Number: [place] [facility] [place]

## 2019-08-01 NOTE — Progress Notes (Signed)
STROKE TEAM PROGRESS NOTE   INTERVAL HISTORY   She is doing well.She continues to do well.encourage  She states her speech is improving.  She has no complaints.   .  2D echo is unremarkable. Vitals:   07/31/19 2001 07/31/19 2356 08/01/19 0500 08/01/19 1249  BP: 110/73 119/70 140/79 130/79  Pulse: 87 78 79 78  Resp: 18 18 18 16   Temp: 98.7 F (37.1 C) 98.3 F (36.8 C) (!) 97.5 F (36.4 C) (!) 97.5 F (36.4 C)  TempSrc: Oral Oral Oral Oral  SpO2: 93% 93% 93% 95%  Weight:      Height:        CBC:  Recent Labs  Lab 07/30/19 1802 07/30/19 1827 08/01/19 0536  WBC 38.4*  --  33.0*  NEUTROABS 19.2*  --   --   HGB 14.6 15.3* 12.9  HCT 44.8 45.0 39.7  MCV 93.5  --  93.2  PLT 111*  --  93*    Basic Metabolic Panel:  Recent Labs  Lab 07/30/19 1005 07/30/19 1827 08/01/19 0536  NA 139 140 138  K 4.2 4.4 3.6  CL 105 104 103  CO2 24  --  24  GLUCOSE 100* 101* 86  BUN 7* 9 7*  CREATININE 0.63 0.50 0.51  CALCIUM 9.2  --  8.4*   Lipid Panel:     Component Value Date/Time   CHOL 188 07/31/2019 0547   TRIG 67 07/31/2019 0547   HDL 45 07/31/2019 0547   CHOLHDL 4.2 07/31/2019 0547   VLDL 13 07/31/2019 0547   LDLCALC 130 (H) 07/31/2019 0547   HgbA1c:  Lab Results  Component Value Date   HGBA1C 5.0 07/31/2019   Urine Drug Screen:     Component Value Date/Time   LABOPIA NONE DETECTED 07/30/2019 1911   COCAINSCRNUR NONE DETECTED 07/30/2019 1911   LABBENZ POSITIVE (A) 07/30/2019 1911   AMPHETMU NONE DETECTED 07/30/2019 1911   THCU NONE DETECTED 07/30/2019 1911   LABBARB NONE DETECTED 07/30/2019 1911    Alcohol Level     Component Value Date/Time   ETH <10 07/30/2019 1802    IMAGING Ct Angio Head W Or Wo Contrast  Result Date: 07/30/2019 CLINICAL DATA:  Dizziness and syncope. Head trauma. EXAM: CT ANGIOGRAPHY HEAD AND NECK TECHNIQUE: Multidetector CT imaging of the head and neck was performed using the standard protocol during bolus administration of intravenous  contrast. Multiplanar CT image reconstructions and MIPs were obtained to evaluate the vascular anatomy. Carotid stenosis measurements (when applicable) are obtained utilizing NASCET criteria, using the distal internal carotid diameter as the denominator. CONTRAST:  36mL OMNIPAQUE IOHEXOL 350 MG/ML SOLN COMPARISON:  None. FINDINGS: CTA NECK FINDINGS SKELETON: There is no bony spinal canal stenosis. No lytic or blastic lesion. OTHER NECK: Normal pharynx, larynx and major salivary glands. No cervical lymphadenopathy. Unremarkable thyroid gland. UPPER CHEST: No pneumothorax or pleural effusion. No nodules or masses. AORTIC ARCH: There is mild calcific atherosclerosis of the aortic arch. There is no aneurysm, dissection or hemodynamically significant stenosis of the visualized portion of the aorta. Conventional 3 vessel aortic branching pattern. The visualized proximal subclavian arteries are widely patent. RIGHT CAROTID SYSTEM: No dissection, occlusion or aneurysm. There is mixed density atherosclerosis extending into the proximal ICA, resulting in less than 50% stenosis. LEFT CAROTID SYSTEM: Normal without aneurysm, dissection or stenosis. VERTEBRAL ARTERIES: Right dominant configuration. Both origins are clearly patent. There is no dissection, occlusion or flow-limiting stenosis to the skull base (V1-V3 segments). CTA HEAD FINDINGS  POSTERIOR CIRCULATION: --Vertebral arteries: Normal V4 segments. --Posterior inferior cerebellar arteries (PICA): Patent origins from the vertebral arteries. --Anterior inferior cerebellar arteries (AICA): Patent origins from the basilar artery. --Basilar artery: Normal. --Superior cerebellar arteries: Normal. --Posterior cerebral arteries: Normal. Both originate from the basilar artery. Posterior communicating arteries (p-comm) are diminutive or absent. ANTERIOR CIRCULATION: --Intracranial internal carotid arteries: Normal. --Anterior cerebral arteries (ACA): Normal. Both A1 segments are  present. Patent anterior communicating artery (a-comm). --Middle cerebral arteries (MCA): Normal. VENOUS SINUSES: As permitted by contrast timing, patent. ANATOMIC VARIANTS: None Review of the MIP images confirms the above findings. IMPRESSION: 1. No emergent large vessel occlusion or high-grade stenosis of the intracranial arteries. 2. No dissection, aneurysm or hemodynamically significant stenosis of the carotid or vertebral arteries. 3. Aortic Atherosclerosis (ICD10-I70.0). Electronically Signed   By: Ulyses Jarred M.D.   On: 07/30/2019 22:16   Dg Chest 2 View  Result Date: 07/30/2019 CLINICAL DATA:  Left-sided chest pain. EXAM: CHEST - 2 VIEW COMPARISON:  02/28/2017 FINDINGS: The heart size and mediastinal contours are within normal limits. Both lungs are clear. The visualized skeletal structures are unremarkable. Aortic atherosclerosis. IMPRESSION: 1. No active cardiopulmonary disease. 2. Aortic atherosclerosis. Electronically Signed   By: Lorriane Shire M.D.   On: 07/30/2019 18:50   Ct Angio Neck W Or Wo Contrast  Result Date: 07/30/2019 CLINICAL DATA:  Dizziness and syncope. Head trauma. EXAM: CT ANGIOGRAPHY HEAD AND NECK TECHNIQUE: Multidetector CT imaging of the head and neck was performed using the standard protocol during bolus administration of intravenous contrast. Multiplanar CT image reconstructions and MIPs were obtained to evaluate the vascular anatomy. Carotid stenosis measurements (when applicable) are obtained utilizing NASCET criteria, using the distal internal carotid diameter as the denominator. CONTRAST:  68mL OMNIPAQUE IOHEXOL 350 MG/ML SOLN COMPARISON:  None. FINDINGS: CTA NECK FINDINGS SKELETON: There is no bony spinal canal stenosis. No lytic or blastic lesion. OTHER NECK: Normal pharynx, larynx and major salivary glands. No cervical lymphadenopathy. Unremarkable thyroid gland. UPPER CHEST: No pneumothorax or pleural effusion. No nodules or masses. AORTIC ARCH: There is mild  calcific atherosclerosis of the aortic arch. There is no aneurysm, dissection or hemodynamically significant stenosis of the visualized portion of the aorta. Conventional 3 vessel aortic branching pattern. The visualized proximal subclavian arteries are widely patent. RIGHT CAROTID SYSTEM: No dissection, occlusion or aneurysm. There is mixed density atherosclerosis extending into the proximal ICA, resulting in less than 50% stenosis. LEFT CAROTID SYSTEM: Normal without aneurysm, dissection or stenosis. VERTEBRAL ARTERIES: Right dominant configuration. Both origins are clearly patent. There is no dissection, occlusion or flow-limiting stenosis to the skull base (V1-V3 segments). CTA HEAD FINDINGS POSTERIOR CIRCULATION: --Vertebral arteries: Normal V4 segments. --Posterior inferior cerebellar arteries (PICA): Patent origins from the vertebral arteries. --Anterior inferior cerebellar arteries (AICA): Patent origins from the basilar artery. --Basilar artery: Normal. --Superior cerebellar arteries: Normal. --Posterior cerebral arteries: Normal. Both originate from the basilar artery. Posterior communicating arteries (p-comm) are diminutive or absent. ANTERIOR CIRCULATION: --Intracranial internal carotid arteries: Normal. --Anterior cerebral arteries (ACA): Normal. Both A1 segments are present. Patent anterior communicating artery (a-comm). --Middle cerebral arteries (MCA): Normal. VENOUS SINUSES: As permitted by contrast timing, patent. ANATOMIC VARIANTS: None Review of the MIP images confirms the above findings. IMPRESSION: 1. No emergent large vessel occlusion or high-grade stenosis of the intracranial arteries. 2. No dissection, aneurysm or hemodynamically significant stenosis of the carotid or vertebral arteries. 3. Aortic Atherosclerosis (ICD10-I70.0). Electronically Signed   By: Ulyses Jarred M.D.   On: 07/30/2019  22:16   Mr Angio Head Wo Contrast  Result Date: 07/31/2019 CLINICAL DATA:  Leukemia. Left-sided  weakness. EXAM: MRI HEAD WITHOUT CONTRAST MRA HEAD WITHOUT CONTRAST TECHNIQUE: Multiplanar, multiecho pulse sequences of the brain and surrounding structures were obtained without intravenous contrast. Angiographic images of the head were obtained using MRA technique without contrast. COMPARISON:  None. FINDINGS: MRI HEAD FINDINGS BRAIN: Small acute infarct of the superior right pons. Early confluent hyperintense T2-weighted signal of the periventricular and deep white matter, most commonly due to chronic ischemic microangiopathy. The cerebral and cerebellar volume are age-appropriate. There is no hydrocephalus. The midline structures are normal. VASCULAR: The major intracranial arterial and venous sinus flow voids are normal. Susceptibility-sensitive sequences show no chronic microhemorrhage or superficial siderosis. SKULL AND UPPER CERVICAL SPINE: Calvarial bone marrow signal is normal. There is no skull base mass. The visualized upper cervical spine and soft tissues are normal. SINUSES/ORBITS: There are no fluid levels or advanced mucosal thickening. The mastoid air cells and middle ear cavities are free of fluid. The orbits are normal. MRA HEAD FINDINGS POSTERIOR CIRCULATION: --Vertebral arteries: Normal V4 segments. --Posterior inferior cerebellar arteries (PICA): Patent origins from the vertebral arteries. --Anterior inferior cerebellar arteries (AICA): Patent origins from the basilar artery. --Basilar artery: Normal. --Superior cerebellar arteries: Normal. --Posterior cerebral arteries: Normal. Both originate from the basilar artery. Posterior communicating arteries (p-comm) are diminutive or absent. ANTERIOR CIRCULATION: --Intracranial internal carotid arteries: Normal. --Anterior cerebral arteries (ACA): Normal. Both A1 segments are present. Patent anterior communicating artery (a-comm). --Middle cerebral arteries (MCA): Normal. IMPRESSION: 1. 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 No hemorrhage or  mass effect. 2. Normal intracranial MRA. 3. Chronic ischemic microangiopathy. Electronically Signed   By: Ulyses Jarred M.D.   On: 07/31/2019 00:29   Mr Brain Wo Contrast (neuro Protocol)  Result Date: 07/31/2019 CLINICAL DATA:  Leukemia. Left-sided weakness. EXAM: MRI HEAD WITHOUT CONTRAST MRA HEAD WITHOUT CONTRAST TECHNIQUE: Multiplanar, multiecho pulse sequences of the brain and surrounding structures were obtained without intravenous contrast. Angiographic images of the head were obtained using MRA technique without contrast. COMPARISON:  None. FINDINGS: MRI HEAD FINDINGS BRAIN: Small acute infarct of the superior right pons. Early confluent hyperintense T2-weighted signal of the periventricular and deep white matter, most commonly due to chronic ischemic microangiopathy. The cerebral and cerebellar volume are age-appropriate. There is no hydrocephalus. The midline structures are normal. VASCULAR: The major intracranial arterial and venous sinus flow voids are normal. Susceptibility-sensitive sequences show no chronic microhemorrhage or superficial siderosis. SKULL AND UPPER CERVICAL SPINE: Calvarial bone marrow signal is normal. There is no skull base mass. The visualized upper cervical spine and soft tissues are normal. SINUSES/ORBITS: There are no fluid levels or advanced mucosal thickening. The mastoid air cells and middle ear cavities are free of fluid. The orbits are normal. MRA HEAD FINDINGS POSTERIOR CIRCULATION: --Vertebral arteries: Normal V4 segments. --Posterior inferior cerebellar arteries (PICA): Patent origins from the vertebral arteries. --Anterior inferior cerebellar arteries (AICA): Patent origins from the basilar artery. --Basilar artery: Normal. --Superior cerebellar arteries: Normal. --Posterior cerebral arteries: Normal. Both originate from the basilar artery. Posterior communicating arteries (p-comm) are diminutive or absent. ANTERIOR CIRCULATION: --Intracranial internal carotid  arteries: Normal. --Anterior cerebral arteries (ACA): Normal. Both A1 segments are present. Patent anterior communicating artery (a-comm). --Middle cerebral arteries (MCA): Normal. IMPRESSION: 1. 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 No hemorrhage or mass effect. 2. Normal intracranial MRA. 3. Chronic ischemic microangiopathy. Electronically Signed   By: Cletus Gash.D.  On: 07/31/2019 00:29    PHYSICAL EXAM Pleasant frail elderly Caucasian lady not in distress. . Afebrile. Head is nontraumatic. Neck is supple without bruit.    Cardiac exam no murmur or gallop. Lungs are clear to auscultation. Distal pulses are well felt. Neurological Exam ;  Awake  Alert oriented x 3. Normal speech and language.eye movements full without nystagmus.fundi were not visualized. Vision acuity and fields appear normal. Hearing is normal. Palatal movements are normal. Face asymmetric mild left lower facial weakness.. Tongue midline. Normal strength, tone, reflexes and coordination except mild weakness of left grip and intrinsic hand muscles.  Fine finger movements are diminished on the left.  Albeit slight of left upper extremity.. Normal sensation. Gait deferred.  ASSESSMENT/PLAN Abigail Moreno is a 85 y.o. female with history of HTN, HLD presenting with L sided weakness.    Stroke:   Small R pontine infarct secondary to small vessel disease    CT head / CT maxillofacial soft tissue forehead contusion w/o fx. Small vessel disease.   CTA head & neck No ELVO. Aortic atherosclerosis.   MRI  Small R pontine infarct. Small vessel disease.   MRA  Unremarkable   2D Echo EF 65-70%. No source of embolus   LDL 130  HgbA1c 5.0  Lovenox 40 mg sq daily for VTE prophylaxis  No antithrombotic prior to admission, now on aspirin 325 mg dailyGiven mild stroke, recommend aspirin 81 mg and plavix 75 mg daily x 3 weeks, then aspirin alone. Orders adjusted.   Therapy recommendations:  CIR  Disposition:   pending   Hypertension  Stable . Permissive hypertension (OK if < 220/120) but gradually normalize in 5-7 days . Long-term BP goal normotensive  Hyperlipidemia  Home meds:  Fish oil  Intolerant to statins  LDL 130, goal < 70  Continue statin at discharge  Other Stroke Risk Factors  Advanced age  Former Cigarette smoker, quit 60 yrs ago  Other Active Problems  Macular degeneration, bilateral   Hx breast cancer  CLL [facility])  Hospital day # 2  .  She presented with left facial and hand weakness secondary to right pontine lacunar infarct from small vessel disease.  Continue ongoing   aggressive risk factor modification.  Dual antiplatelet therapy of aspirin and Plavix for 3 weeks followed by Aspirin alone.  Transfer to inpatient rehab when bed available.  Follow-up as an outpatient with stroke clinic in 6 weeks.  Stroke team will sign off.  Kindly call for questions.   Antony Contras, MD Medical Director [facility] Pager: [phone] 08/01/2019 2:04 PM   To contact Stroke Continuity provider, please refer to [email or web address] After hours, contact General Neurology

## 2019-08-01 NOTE — Progress Notes (Signed)
  Speech Language Pathology Treatment: Dysphagia  Patient Details Name: Abigail Moreno MRN: [id number] DOB: [birth year] Today's Date: 08/01/2019 Time: 5631-4970 SLP Time Calculation (min) (ACUTE ONLY): 12 min  Assessment / Plan / Recommendation Clinical Impression  Pt was seen for skilled ST targeting dysphagia goals.  Pt reports that the only difficulty she has had during meals has been related to getting the appropriate assistance for tray set up and positioning.  Pt endorses that she has difficulty eating in bed which at times leads to coughing but tolerates POs well when she is able to sit up.  Therapist assisted pt in coming to the edge of the bed for a functional snack of regular textures and thin liquids.  No overt s/s of aspiration were observed with solids or liquids.  Oral phase was efficient for containing and clearing materials from the oral cavity.  As a result, recommend that pt remain on her currently prescribed diet of regular textures and thin liquids with intermittent assistance for tray set up/opening containers/cutting food.  No further ST needs are indicated at this time.  Pt was left in bed with bed alarm set and call bell within reach.    HPI HPI: Abigail Moreno, 89y/f, presented to ER after a syncopal episode with persistent weakness of left upper and lower extremity. PMH of Chronic lylmphocytic leukemia, Dyspnea, hyperlipemia, hypertension, macular degeneration of both eyes, osteoprosis, breast cancer, anxiety.  Contusion around nasal bridge from fall. 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 No hemorrhage or mass effect. Patient failed Trevor Mace Screen in ER.       SLP Plan  Discharge SLP treatment due to (comment);All goals met  Patient does not need any further Speech Lanaguage Pathology Services    Recommendations  Diet recommendations: Regular;Thin liquid Liquids provided via: Cup;Straw Medication Administration: Whole meds with  liquid Supervision: Patient able to self feed(please assist with tray set up) Compensations: Minimize environmental distractions;Slow rate;Small sips/bites Postural Changes and/or Swallow Maneuvers: Out of bed for meals;Seated upright 90 degrees;Upright 30-60 min after meal                Oral Care Recommendations: Oral care BID Follow up Recommendations: None Plan: Discharge SLP treatment due to (comment);All goals met       GO                Letasha Kershaw, Selinda Orion 08/01/2019, 9:39 AM

## 2019-08-01 NOTE — IPOC Note (Signed)
Individualized overall Plan of Care [name]) Patient Details Name: Abigail Moreno MRN: [id number] DOB: 01-27-1929  Admitting Diagnosis: Right pontine CVA [facility])  Hospital Problems: Principal Problem:   Right pontine CVA ([place]) Active Problems:   Acute CVA (cerebrovascular accident) ([place])   Personal history of CLL (chronic lymphocytic leukemia)     Functional Problem List: Nursing    PT Balance, Behavior, Endurance, Motor, Safety, Sensory, Skin Integrity  OT Balance, Endurance, Motor, Pain, Perception, Safety, Skin Integrity  SLP    TR         Basic ADL's: OT Grooming, Bathing, Dressing, Toileting     Advanced  ADL's: OT       Transfers: PT Bed Mobility, Bed to Chair, Car, [employer], Futures trader, Metallurgist: PT Ambulation, Emergency planning/management officer, Stairs     Additional Impairments: OT Fuctional Use of Upper Extremity  SLP        TR      Anticipated Outcomes Item Anticipated Outcome  Self Feeding n/a  Swallowing      Basic self-care  supervision  Toileting  supervision   Bathroom Transfers supervision  Bowel/Bladder     Transfers  supervision  Locomotion  supervision  Communication     Cognition     Pain     Safety/Judgment      Therapy Plan: PT Intensity: Minimum of 1-2 x/day ,45 to 90 minutes PT Frequency: 5 out of 7 days PT Duration Estimated Length of Stay: 2-2.5 weeks OT Intensity: Minimum of 1-2 x/day, 45 to 90 minutes OT Frequency: 5 out of 7 days OT Duration/Estimated Length of Stay: ~2 weeks      Team Interventions: Nursing Interventions    PT interventions Ambulation/gait training, DME/adaptive equipment instruction, Neuromuscular re-education, Psychosocial support, Stair training, UE/LE Strength taining/ROM, Balance/vestibular training, Discharge planning, Pain management, Skin care/wound management, Therapeutic Activities, UE/LE Coordination activities, Cognitive remediation/compensation, Disease  management/prevention, Functional mobility training, Patient/family education, Splinting/orthotics, Therapeutic Exercise, Visual/perceptual remediation/compensation  OT Interventions Balance/vestibular training, Disease mangement/prevention, Neuromuscular re-education, Self Care/advanced ADL retraining, Therapeutic Exercise, Cognitive remediation/compensation, DME/adaptive equipment instruction, Pain management, Skin care/wound managment, UE/LE Strength taining/ROM, Wheelchair propulsion/positioning, Community reintegration, Barrister's clerk education, UE/LE Coordination activities, Discharge planning, Functional mobility training, Psychosocial support, Therapeutic Activities  SLP Interventions    TR Interventions    SW/CM Interventions     Barriers to Discharge MD  Medical stability  Nursing      PT      OT      SLP      SW       Team Discharge Planning: Destination: PT-Home ,OT- Home , SLP-  Projected Follow-up: PT-Home health PT, OT-  Outpatient OT, SLP-  Projected Equipment Needs: PT-To be determined, OT- To be determined, SLP-  Equipment Details: PT-pt has RW and SPC, OT-  Patient/family involved in discharge planning: PT- Patient,  OT-Patient, SLP-   MD ELOS: 13-17 days. Medical Rehab Prognosis:  Good Assessment: 85 year old right-handed female with history of CLL under observation followed by Dr. Marko Moreno, hypertension as well as hyperlipidemia.  Per chart review and patient, patient lives alone and was independent prior to admission.  Her daughter lives local and can provide assistance as needed.  She presented 07/30/2019 with left-sided weakness and fall after syncopal episode landing on her face.  Cranial CT unremarkable for acute intracranial abnormalities.  There was a soft tissue contusion on the forehead without underlying fracture.  CT maxillofacial negative for fracture.  Patient did not receive TPA.  CT angiogram of head and neck with no emergent large vessel occlusion or  stenosis.  MRI of the brain showed small acute infarct of the superior right pons.  Echocardiogram with ejection fraction of 70% without emboli.  Neurology recommended aspirin and Plavix x3 weeks, then aspirin alone.  Patient with resulting functional deficits with mobility, ADLs, self-care.  We will set goals for Supervision with PT/OT.  Due to the current state of emergency, patients may not be receiving their 3-hours of Medicare-mandated therapy.  See Team Conference Notes for weekly updates to the plan of care

## 2019-08-01 NOTE — H&P (Signed)
Physical Medicine and Rehabilitation Admission H&P    Chief Complaint  Patient presents with   Loss of Consciousness  : HPI: Abigail Moreno is an 85 year old right-handed female with history of CLL under observation followed by Dr. Marko Plume, hypertension as well as hyperlipidemia.  Per chart review and patient, patient lives alone and was independent prior to admission.  Her daughter lives local and can provide assistance as needed.  She presented 07/30/2019 with left-sided weakness and fall after syncopal episode landing on her face.  Cranial CT unremarkable for acute intracranial abnormalities.  There was a soft tissue contusion on the forehead without underlying fracture.  CT maxillofacial negative for fracture.  Patient did not receive TPA.  CT angiogram of head and neck with no emergent large vessel occlusion or stenosis.  MRI of the brain showed small acute infarct of the superior right pons.  Echocardiogram with ejection fraction of 70% without emboli.  Neurology recommended aspirin and Plavix x3 weeks, then aspirin alone.  Subcutaneous Lovenox for DVT prophylaxis.  Tolerating a regular diet.  Therapy evaluations completed and patient was admitted for a comprehensive rehab program.  Please see preadmission assessment from earlier today as well.  Review of Systems  Constitutional: Negative for chills and fever.  Respiratory: Positive for shortness of breath.   Cardiovascular: Negative for chest pain and palpitations.  Gastrointestinal: Positive for constipation. Negative for heartburn, nausea and vomiting.  Genitourinary: Negative for dysuria, flank pain and hematuria.  Musculoskeletal: Positive for joint pain and myalgias.  Skin: Negative for rash.  Neurological: Positive for dizziness and focal weakness.       Bouts of syncope  Psychiatric/Behavioral: The patient has insomnia.        Anxiety   Past Medical History:  Diagnosis Date   Anxiety    Arthritis of knee  FALL  2012   RIGHT KNEE, hips, and fingers   Breast cancer ([place]) FEBRUARY 2004   T1, NO, ER/PR POSITIVE LEFT BREAST   CLL (chronic lymphocytic leukemia) ([place]) 2004   Dr. Marko Plume is oncologist   Dyspnea    occ sob with exertion   History of blood transfusion    with first and maybe second delivery   Hyperlipemia    Hypertension    Macular degeneration    both eyes   Osteoporosis    borderline   UTI (urinary tract infection)    on nitrofuratonin bid x 7 days started 09-12-18 am   Past Surgical History:  Procedure Laterality Date   ABDOMINAL HYSTERECTOMY  1972   UNILATERAL OOPHORECTOMY( LEFT)   APPENDECTOMY     EYE SURGERY Bilateral    cataracts   INCONTINENCE SURGERY     MASTECTOMY PARTIAL / LUMPECTOMY W/ AXILLARY LYMPHADENECTOMY  FEBRUARY 2004   LEFT BREAST   OTHER SURGICAL HISTORY     cysts removed from breasts    TOTAL HIP ARTHROPLASTY  12/26/2011   Procedure: TOTAL HIP ARTHROPLASTY ANTERIOR APPROACH;  Surgeon: Mauri Pole, MD;  Location: [facility];  Service: Orthopedics;  Laterality: Right;   TOTAL HIP ARTHROPLASTY Left 09/17/2018   Procedure: TOTAL HIP ARTHROPLASTY ANTERIOR APPROACH;  Surgeon: Paralee Cancel, MD;  Location: [facility];  Service: Orthopedics;  Laterality: Left;  70 mins   Family History  Problem Relation Age of Onset   Pancreatic cancer Maternal Grandmother    Cancer Paternal Grandmother        ? type    Cancer Other        breast-1st paternal  cousin-no treatment, liver cancer-2nd maternal cousin   Hypertension Mother    Hypertension Sister        x2   High Cholesterol Sister        x2   Heart attack Father    Social History:  reports that she quit smoking about 60 years ago. Her smoking use included cigarettes. She has a 0.50 pack-year smoking history. She has never used smokeless tobacco. She reports that she does not drink alcohol or use drugs. Allergies:  Allergies  Allergen Reactions   Colesevelam Hcl Other (See Comments)      aches   Ezetimibe Other (See Comments)    aches   Procaine Hcl Swelling    nocaine=swelling   Rosuvastatin Other (See Comments)    aches   Simvastatin Other (See Comments)    aches   Statins Other (See Comments)    aches   Sulfa Antibiotics Other (See Comments)    Bacteria in intestines    Medications Prior to Admission  Medication Sig Dispense Refill   Calcium Carb-Cholecalciferol (CALCIUM 500 + D3 PO) Take 2 tablets by mouth daily.     Cinnamon 500 MG capsule Take 1,000 mg by mouth daily.      loratadine (CLARITIN) 10 MG tablet Take 10 mg by mouth daily at 12 noon.      Multiple Vitamin (MULTIVITAMIN WITH MINERALS) TABS tablet Take 1 tablet by mouth daily. Women's One A Day 50+ Multivitamin     Multiple Vitamins-Minerals (PRESERVISION AREDS 2 PO) Take 1 tablet by mouth 2 (two) times daily.     Omega-3 Fatty Acids (FISH OIL) 1000 MG CAPS Take 1,000 mg by mouth 2 (two) times daily.     tobramycin (TOBREX) 0.3 % ophthalmic solution Place 1 drop into the left eye See admin instructions. Use 4 drop into left eye 4 times daily the day before, the day of, and the day after eye injection  5   [DISCONTINUED] ALPRAZolam (XANAX) 0.25 MG tablet Take 0.25 mg by mouth every 6 (six) hours as needed for anxiety.   0   amLODipine-benazepril (LOTREL) 5-20 MG capsule Take 1 capsule by mouth daily as needed (elevated blood pressure).     CRANBERRY PO Take 4,200 mg by mouth daily.     docusate sodium (COLACE) 100 MG capsule Take 1 capsule (100 mg total) by mouth 2 (two) times daily. (Patient not taking: Reported on 07/30/2019) 10 capsule 0   ferrous sulfate (FERROUSUL) 325 (65 FE) MG tablet Take 1 tablet (325 mg total) by mouth 3 (three) times daily with meals. (Patient not taking: Reported on 07/30/2019)  3   HYDROcodone-acetaminophen (NORCO) 7.5-325 MG tablet Take 1-2 tablets by mouth every 4 (four) hours as needed for moderate pain. 60 tablet 0   methocarbamol (ROBAXIN) 500 MG tablet  Take 1 tablet (500 mg total) by mouth every 6 (six) hours as needed for muscle spasms. 40 tablet 0   nitrofurantoin (MACRODANTIN) 100 MG capsule Take 100 mg by mouth 2 (two) times daily. X 7 days started am 09-12-18     polyethylene glycol (MIRALAX / GLYCOLAX) packet Take 17 g by mouth 2 (two) times daily. (Patient not taking: Reported on 07/30/2019) 14 each 0   pramoxine (PROCTOFOAM) 1 % foam Use as directed for hemorrhoids (Patient taking differently: Place 1 application rectally every 12 (twelve) hours as needed (hemorrhoids). Use as directed for hemorrhoids) 15 g 1   rivaroxaban (XARELTO) 10 MG TABS tablet Take 1 tablet (10 mg total)  by mouth daily. 14 tablet 0   sertraline (ZOLOFT) 50 MG tablet Take 25 mg by mouth at bedtime.     [DISCONTINUED] HYDROcodone-acetaminophen (NORCO) 7.5-325 MG tablet Take 1-2 tablets by mouth every 4 (four) hours as needed for moderate pain. 60 tablet 0    Drug Regimen Review Drug regimen was reviewed and remains appropriate with no significant issues identified  Home: Home Living Family/patient expects to be discharged to:: Private residence Living Arrangements: Alone Available Help at Discharge: Family, Available 24 hours/day Type of Home: House Home Access: Stairs to enter [employer] of Steps: 1 onto patio then 1 into [employer] Insurance: None Home Layout: One level Bathroom Shower/Tub: Multimedia programmer: Handicapped height Bathroom Accessibility: Yes Home Equipment: Long - single point, Environmental consultant - 2 wheels, Shower seat Additional Comments: can have daughter stay with her, equip from prev hip surgery   Functional History: Prior Function Level of Independence: Independent Comments: Patient drives in community as needed  Functional Status:  Mobility: Bed Mobility Overal bed mobility: Needs Assistance Bed Mobility: Supine to Sit, Sit to Supine Supine to sit: Mod assist Sit to supine: Min assist, Mod  assist General bed mobility comments: less help back to bed as pt can control descent with RUE Transfers Overall transfer level: Needs assistance Equipment used: Rolling walker (2 wheeled) Transfers: Sit to/from Stand Sit to Stand: Min assist, Mod assist General transfer comment: cues for hand placement, L grip weak but mod assist for first trial only Ambulation/Gait Ambulation/Gait assistance: Min Web designer (Feet): 6 Feet Assistive device: Rolling walker (2 wheeled), 1 person hand held assist Gait Pattern/deviations: Step-to pattern, Decreased stride length, Wide base of support General Gait Details: sidesteps with LLE struggling to move laterally and sits with mnimal warning Gait velocity: reduced Gait velocity interpretation: <1.8 ft/sec, indicate of risk for recurrent falls    ADL: ADL Overall ADL's : Needs assistance/impaired Grooming: Set up, Wash/dry face, Sitting Upper Body Bathing: Set up, Sitting Lower Body Bathing: Moderate assistance, Sitting/lateral leans Upper Body Dressing : Set up, Sitting Lower Body Dressing: Moderate assistance, Sitting/lateral leans Lower Body Dressing Details (indicate cue type and reason): increased time/difficulty doffing L sock, unable to don safety without heavy forward leaning Toilet Transfer: Minimal assistance, Moderate assistance, Ambulation, BSC, RW, Cueing for safety, Cueing for sequencing Toilet Transfer Details (indicate cue type and reason): simulated with side stepping at EOB, difficulty coordination L LE and sequencing with rolling walker Toileting- Clothing Manipulation and Hygiene: Minimal assistance, Sit to/from stand Functional mobility during ADLs: Minimal assistance, Moderate assistance, Rolling walker, Cueing for safety, Cueing for sequencing  Cognition: Cognition Overall Cognitive Status: Within Functional Limits for tasks assessed Orientation Level: Oriented to person, Oriented to place, Oriented to  situation Cognition Arousal/Alertness: Awake/alert Behavior During Therapy: Flat affect Overall Cognitive Status: Within Functional Limits for tasks assessed General Comments: A/O x4, follows commands and is aware she has L deficits  Physical Exam: Blood pressure 140/79, pulse 79, temperature (!) 97.5 F (36.4 C), temperature source Oral, resp. rate 18, height 5' (1.524 m), weight 60.8 kg, SpO2 93 %. Physical Exam  Vitals reviewed. Constitutional: She appears well-developed and well-nourished.  HENT:  Multiple bruises to the forehead and face with edema  Eyes: EOM are normal. Right eye exhibits no discharge. Left eye exhibits no discharge.  Neck: No tracheal deviation present. No thyromegaly present.  Respiratory: Effort normal. No respiratory distress.  GI: She exhibits no distension.  Musculoskeletal:     Comments: No edema  or tenderness in extremities  Neurological: She is alert.  Follows simple commands.   Provides her name and age however she does get confused on some dates and limited medical historian. HOH Motor: Right upper extremity/right lower extremity: 5/5 proximal distal Left upper extremity: Shoulder abduction, elbow flexion/extension 3+/5, handgrip 3/5 with apraxia Left lower extremity: 4 -/5 proximal distal Sensation intact light touch  Skin:  Facial abrasions  Psychiatric: Her affect is blunt. She is slowed.    Results for orders placed or performed during the hospital encounter of 07/30/19 (from the past 48 hour(s))  Basic metabolic panel     Status: Abnormal   Collection Time: 07/30/19 10:05 AM  Result Value Ref Range   Sodium 139 135 - 145 mmol/L   Potassium 4.2 3.5 - 5.1 mmol/L   Chloride 105 98 - 111 mmol/L   CO2 24 22 - 32 mmol/L   Glucose, Bld 100 (H) 70 - 99 mg/dL   BUN 7 (L) 8 - 23 mg/dL   Creatinine, Ser 0.63 0.44 - 1.00 mg/dL   Calcium 9.2 8.9 - 10.3 mg/dL   GFR calc non Af Amer >60 >60 mL/min   GFR calc Af Amer >60 >60 mL/min   Anion gap 10  5 - 15    Comment: Performed at [facility] [address] [place], [place] [address]  CBC     Status: Abnormal   Collection Time: 07/30/19 10:05 AM  Result Value Ref Range   WBC 36.9 (H) 4.0 - 10.5 K/uL   RBC 4.89 3.87 - 5.11 MIL/uL   Hemoglobin 14.7 12.0 - 15.0 g/dL   HCT 45.9 36.0 - 46.0 %   MCV 93.9 80.0 - 100.0 fL   MCH 30.1 26.0 - 34.0 pg   MCHC 32.0 30.0 - 36.0 g/dL   RDW 13.7 11.5 - 15.5 %   Platelets 106 (L) 150 - 400 K/uL    Comment: REPEATED TO VERIFY PLATELET COUNT CONFIRMED BY SMEAR Immature Platelet Fraction may be clinically indicated, consider ordering this additional test [id number]    nRBC 0.0 0.0 - 0.2 %    Comment: Performed at [facility], [place] [address], [place], [place] [address]  Ethanol     Status: None   Collection Time: 07/30/19  6:02 PM  Result Value Ref Range   Alcohol, Ethyl (B) <10 <10 mg/dL    Comment: (NOTE) Lowest detectable limit for serum alcohol is 10 mg/dL. For medical purposes only. Performed at [facility], [place] [address], [place], [place] [address]   Protime-INR     Status: None   Collection Time: 07/30/19  6:02 PM  Result Value Ref Range   Prothrombin Time 14.0 11.4 - 15.2 seconds   INR 1.1 0.8 - 1.2    Comment: (NOTE) INR goal varies based on device and disease states. Performed at [facility], [place] [address] [place], [place] [address]   APTT     Status: None   Collection Time: 07/30/19  6:02 PM  Result Value Ref Range   aPTT 30 24 - 36 seconds    Comment: Performed at [facility] [address], [place], [place] [address]  Differential     Status: Abnormal   Collection Time: 07/30/19  6:02 PM  Result Value Ref Range   Neutrophils Relative % 50 %   Neutro Abs 19.2 (H) 1.7 - 7.7 K/uL   Lymphocytes Relative 45 %   Lymphs Abs 17.3 (  H) 0.7 - 4.0 K/uL   Monocytes Relative 5 %   Monocytes Absolute 1.9 (H) 0.1 - 1.0 K/uL   Eosinophils Relative 0 %   Eosinophils Absolute 0.0 0.0 - 0.5  K/uL   Basophils Relative 0 %   Basophils Absolute 0.0 0.0 - 0.1 K/uL   Abs Immature Granulocytes 0.00 0.00 - 0.07 K/uL   Smudge Cells PRESENT     Comment: Performed at [facility] [address] [place], [place] [address]  CBC     Status: Abnormal   Collection Time: 07/30/19  6:02 PM  Result Value Ref Range   WBC 38.4 (H) 4.0 - 10.5 K/uL   RBC 4.79 3.87 - 5.11 MIL/uL   Hemoglobin 14.6 12.0 - 15.0 g/dL   HCT 44.8 36.0 - 46.0 %   MCV 93.5 80.0 - 100.0 fL   MCH 30.5 26.0 - 34.0 pg   MCHC 32.6 30.0 - 36.0 g/dL   RDW 13.8 11.5 - 15.5 %   Platelets 111 (L) 150 - 400 K/uL    Comment: REPEATED TO VERIFY Immature Platelet Fraction may be clinically indicated, consider ordering this additional test [id number] CONSISTENT WITH PREVIOUS RESULT    nRBC 0.0 0.0 - 0.2 %    Comment: Performed at [facility], [place] [address] [place], [place] [address]  I-stat chem 8, ED     Status: Abnormal   Collection Time: 07/30/19  6:27 PM  Result Value Ref Range   Sodium 140 135 - 145 mmol/L   Potassium 4.4 3.5 - 5.1 mmol/L   Chloride 104 98 - 111 mmol/L   BUN 9 8 - 23 mg/dL   Creatinine, Ser 0.50 0.44 - 1.00 mg/dL   Glucose, Bld 101 (H) 70 - 99 mg/dL   Calcium, Ion 1.11 (L) 1.15 - 1.40 mmol/L   TCO2 25 22 - 32 mmol/L   Hemoglobin 15.3 (H) 12.0 - 15.0 g/dL   HCT 45.0 36.0 - 46.0 %  Urine rapid drug screen (hosp performed)     Status: Abnormal   Collection Time: 07/30/19  7:11 PM  Result Value Ref Range   Opiates NONE DETECTED NONE DETECTED   Cocaine NONE DETECTED NONE DETECTED   Benzodiazepines POSITIVE (A) NONE DETECTED   Amphetamines NONE DETECTED NONE DETECTED   Tetrahydrocannabinol NONE DETECTED NONE DETECTED   Barbiturates NONE DETECTED NONE DETECTED    Comment: (NOTE) DRUG SCREEN FOR MEDICAL PURPOSES ONLY.  IF CONFIRMATION IS NEEDED FOR ANY PURPOSE, NOTIFY LAB WITHIN 5 DAYS. LOWEST DETECTABLE LIMITS FOR URINE DRUG SCREEN Drug Class                     Cutoff  (ng/mL) Amphetamine and metabolites    1000 Barbiturate and metabolites    200 Benzodiazepine                 [id number] Tricyclics and metabolites     300 Opiates and metabolites        300 Cocaine and metabolites        300 THC                            50 Performed at [facility], [place] [address], [place], [place] [address]   Urinalysis, Routine w reflex microscopic     Status: Abnormal   Collection Time: 07/30/19  7:15 PM  Result Value Ref Range   Color, Urine YELLOW YELLOW  APPearance CLEAR CLEAR   Specific Gravity, Urine 1.013 1.005 - 1.030   pH 6.0 5.0 - 8.0   Glucose, UA NEGATIVE NEGATIVE mg/dL   Hgb urine dipstick NEGATIVE NEGATIVE   Bilirubin Urine NEGATIVE NEGATIVE   Ketones, ur 20 (A) NEGATIVE mg/dL   Protein, ur NEGATIVE NEGATIVE mg/dL   Nitrite NEGATIVE NEGATIVE   Leukocytes,Ua TRACE (A) NEGATIVE   RBC / HPF 0-5 0 - 5 RBC/hpf   WBC, UA 6-10 0 - 5 WBC/hpf   Bacteria, UA RARE (A) NONE SEEN   Squamous Epithelial / LPF 0-5 0 - 5   Mucus PRESENT     Comment: Performed at [facility], [place] [address], [place], [place] [address]  Urine culture     Status: None (Preliminary result)   Collection Time: 07/30/19  7:44 PM   Specimen: Urine, Random  Result Value Ref Range   Specimen Description URINE, RANDOM    Special Requests NONE    Culture      CULTURE REINCUBATED FOR BETTER GROWTH Performed at [facility], [place] [address] [place], [place] [address]    Report Status PENDING   SARS CORONAVIRUS 2 (TAT 6-24 HRS) Nasopharyngeal Nasopharyngeal Swab     Status: None   Collection Time: 07/30/19  8:27 PM   Specimen: Nasopharyngeal Swab  Result Value Ref Range   SARS Coronavirus 2 NEGATIVE NEGATIVE    Comment: (NOTE) SARS-CoV-2 target nucleic acids are NOT DETECTED. The SARS-CoV-2 RNA is generally detectable in upper and lower respiratory specimens during the acute phase of infection. Negative results do not preclude SARS-CoV-2 infection, do not rule  out co-infections with other pathogens, and should not be used as the sole basis for treatment or other patient management decisions. Negative results must be combined with clinical observations, patient history, and epidemiological information. The expected result is Negative. Fact Sheet for Patients: SugarRoll.be Fact Sheet for Healthcare Providers: https://www.woods-mathews.com/ This test is not yet approved or cleared by the [place] FDA and  has been authorized for detection and/or diagnosis of SARS-CoV-2 by FDA under an Emergency Use Authorization (EUA). This EUA will remain  in effect (meaning this test can be used) for the duration of the COVID-19 declaration under Section 56 4(b)(1) of the Act, 21 U.S.C. section 360bbb-3(b)(1), unless the authorization is terminated or revoked sooner. Performed at [facility], [place] [address] [place], [place] [address]   Hemoglobin A1c     Status: None   Collection Time: 07/31/19  5:47 AM  Result Value Ref Range   Hgb A1c MFr Bld 5.0 4.8 - 5.6 %    Comment: (NOTE) Pre diabetes:          5.7%-6.4% Diabetes:              >6.4% Glycemic control for   <7.0% adults with diabetes    Mean Plasma Glucose 96.8 mg/dL    Comment: Performed at [facility] [address], [place], [place] [address]  Lipid panel     Status: Abnormal   Collection Time: 07/31/19  5:47 AM  Result Value Ref Range   Cholesterol 188 0 - 200 mg/dL   Triglycerides 67 <150 mg/dL   HDL 45 >40 mg/dL   Total CHOL/HDL Ratio 4.2 RATIO   VLDL 13 0 - 40 mg/dL   LDL Cholesterol 130 (H) 0 - 99 mg/dL    Comment:        Total Cholesterol/HDL:CHD Risk Coronary Heart Disease Risk  Table                     Men   Women  1/2 Average Risk   3.4   3.3  Average Risk       5.0   4.4  2 X Average Risk   9.6   7.1  3 X Average Risk  23.4   11.0        Use the calculated Patient Ratio above and the CHD Risk Table to  determine the patient's CHD Risk.        ATP III CLASSIFICATION (LDL):  <100     mg/dL   Optimal  100-129  mg/dL   Near or Above                    Optimal  130-159  mg/dL   Borderline  160-189  mg/dL   High  >190     mg/dL   Very High Performed at [facility] [address], [place], [place] [address]   Glucose, capillary     Status: None   Collection Time: 07/31/19  6:39 AM  Result Value Ref Range   Glucose-Capillary 90 70 - 99 mg/dL  Glucose, capillary     Status: Abnormal   Collection Time: 07/31/19 11:46 AM  Result Value Ref Range   Glucose-Capillary 107 (H) 70 - 99 mg/dL  Glucose, capillary     Status: None   Collection Time: 07/31/19  4:16 PM  Result Value Ref Range   Glucose-Capillary 97 70 - 99 mg/dL  Glucose, capillary     Status: None   Collection Time: 07/31/19  9:40 PM  Result Value Ref Range   Glucose-Capillary 91 70 - 99 mg/dL  Glucose, capillary     Status: None   Collection Time: 08/01/19  5:06 AM  Result Value Ref Range   Glucose-Capillary 83 70 - 99 mg/dL  AMCBC     Status: Abnormal   Collection Time: 08/01/19  5:36 AM  Result Value Ref Range   WBC 33.0 (H) 4.0 - 10.5 K/uL   RBC 4.26 3.87 - 5.11 MIL/uL   Hemoglobin 12.9 12.0 - 15.0 g/dL   HCT 39.7 36.0 - 46.0 %   MCV 93.2 80.0 - 100.0 fL   MCH 30.3 26.0 - 34.0 pg   MCHC 32.5 30.0 - 36.0 g/dL   RDW 13.9 11.5 - 15.5 %   Platelets 93 (L) 150 - 400 K/uL    Comment: REPEATED TO VERIFY SPECIMEN CHECKED FOR CLOTS Immature Platelet Fraction may be clinically indicated, consider ordering this additional test [id number] CONSISTENT WITH PREVIOUS RESULT    nRBC 0.0 0.0 - 0.2 %    Comment: Performed at [facility], [place] [address], [place], [place] [address]  AMBMP     Status: Abnormal   Collection Time: 08/01/19  5:36 AM  Result Value Ref Range   Sodium 138 135 - 145 mmol/L   Potassium 3.6 3.5 - 5.1 mmol/L   Chloride 103 98 - 111 mmol/L   CO2 24 22 - 32 mmol/L   Glucose, Bld 86 70 - 99  mg/dL   BUN 7 (L) 8 - 23 mg/dL   Creatinine, Ser 0.51 0.44 - 1.00 mg/dL   Calcium 8.4 (L) 8.9 - 10.3 mg/dL   GFR calc non Af Amer >60 >60 mL/min   GFR calc Af Amer >60 >60 mL/min   Anion gap 11 5 - 15  Comment: Performed at [facility], [place] [address] [place], [place] [address]   Ct Angio Head W Or Wo Contrast  Result Date: 07/30/2019 CLINICAL DATA:  Dizziness and syncope. Head trauma. EXAM: CT ANGIOGRAPHY HEAD AND NECK TECHNIQUE: Multidetector CT imaging of the head and neck was performed using the standard protocol during bolus administration of intravenous contrast. Multiplanar CT image reconstructions and MIPs were obtained to evaluate the vascular anatomy. Carotid stenosis measurements (when applicable) are obtained utilizing NASCET criteria, using the distal internal carotid diameter as the denominator. CONTRAST:  49mL OMNIPAQUE IOHEXOL 350 MG/ML SOLN COMPARISON:  None. FINDINGS: CTA NECK FINDINGS SKELETON: There is no bony spinal canal stenosis. No lytic or blastic lesion. OTHER NECK: Normal pharynx, larynx and major salivary glands. No cervical lymphadenopathy. Unremarkable thyroid gland. UPPER CHEST: No pneumothorax or pleural effusion. No nodules or masses. AORTIC ARCH: There is mild calcific atherosclerosis of the aortic arch. There is no aneurysm, dissection or hemodynamically significant stenosis of the visualized portion of the aorta. Conventional 3 vessel aortic branching pattern. The visualized proximal subclavian arteries are widely patent. RIGHT CAROTID SYSTEM: No dissection, occlusion or aneurysm. There is mixed density atherosclerosis extending into the proximal ICA, resulting in less than 50% stenosis. LEFT CAROTID SYSTEM: Normal without aneurysm, dissection or stenosis. VERTEBRAL ARTERIES: Right dominant configuration. Both origins are clearly patent. There is no dissection, occlusion or flow-limiting stenosis to the skull base (V1-V3 segments). CTA HEAD FINDINGS POSTERIOR  CIRCULATION: --Vertebral arteries: Normal V4 segments. --Posterior inferior cerebellar arteries (PICA): Patent origins from the vertebral arteries. --Anterior inferior cerebellar arteries (AICA): Patent origins from the basilar artery. --Basilar artery: Normal. --Superior cerebellar arteries: Normal. --Posterior cerebral arteries: Normal. Both originate from the basilar artery. Posterior communicating arteries (p-comm) are diminutive or absent. ANTERIOR CIRCULATION: --Intracranial internal carotid arteries: Normal. --Anterior cerebral arteries (ACA): Normal. Both A1 segments are present. Patent anterior communicating artery (a-comm). --Middle cerebral arteries (MCA): Normal. VENOUS SINUSES: As permitted by contrast timing, patent. ANATOMIC VARIANTS: None Review of the MIP images confirms the above findings. IMPRESSION: 1. No emergent large vessel occlusion or high-grade stenosis of the intracranial arteries. 2. No dissection, aneurysm or hemodynamically significant stenosis of the carotid or vertebral arteries. 3. Aortic Atherosclerosis (ICD10-I70.0). Electronically Signed   By: Ulyses Jarred M.D.   On: 07/30/2019 22:16   Dg Chest 2 View  Result Date: 07/30/2019 CLINICAL DATA:  Left-sided chest pain. EXAM: CHEST - 2 VIEW COMPARISON:  02/28/2017 FINDINGS: The heart size and mediastinal contours are within normal limits. Both lungs are clear. The visualized skeletal structures are unremarkable. Aortic atherosclerosis. IMPRESSION: 1. No active cardiopulmonary disease. 2. Aortic atherosclerosis. Electronically Signed   By: Lorriane Shire M.D.   On: 07/30/2019 18:50   Ct Head Wo Contrast  Result Date: 07/30/2019 CLINICAL DATA:  Status post fall with a blow to the head. Initial encounter. EXAM: CT HEAD WITHOUT CONTRAST CT MAXILLOFACIAL WITHOUT CONTRAST TECHNIQUE: Multidetector CT imaging of the head and maxillofacial structures were performed using the standard protocol without intravenous contrast. Multiplanar  CT image reconstructions of the maxillofacial structures were also generated. COMPARISON:  None. FINDINGS: CT HEAD FINDINGS Brain: No evidence of acute infarction, hemorrhage, hydrocephalus, extra-axial collection or mass lesion/mass effect. Chronic microvascular ischemic change noted. Vascular: No hyperdense vessel or unexpected calcification. Skull: Intact.  No focal lesion. Other: Soft tissue contusion on the forehead is seen. CT MAXILLOFACIAL FINDINGS Osseous: No fracture or mandibular dislocation. No destructive process. Orbits: Negative. No traumatic or inflammatory finding. Status post  cataract surgery. Sinuses: Tiny mucous retention cyst or polyp right maxillary sinus is noted. Trace bilateral mastoid effusions. Soft tissues: Negative. IMPRESSION: Soft tissue contusion on the forehead without underlying fracture. No other acute abnormality head or face. Chronic microvascular ischemic change. Electronically Signed   By: Inge Rise M.D.   On: 07/30/2019 11:18   Ct Angio Neck W Or Wo Contrast  Result Date: 07/30/2019 CLINICAL DATA:  Dizziness and syncope. Head trauma. EXAM: CT ANGIOGRAPHY HEAD AND NECK TECHNIQUE: Multidetector CT imaging of the head and neck was performed using the standard protocol during bolus administration of intravenous contrast. Multiplanar CT image reconstructions and MIPs were obtained to evaluate the vascular anatomy. Carotid stenosis measurements (when applicable) are obtained utilizing NASCET criteria, using the distal internal carotid diameter as the denominator. CONTRAST:  40mL OMNIPAQUE IOHEXOL 350 MG/ML SOLN COMPARISON:  None. FINDINGS: CTA NECK FINDINGS SKELETON: There is no bony spinal canal stenosis. No lytic or blastic lesion. OTHER NECK: Normal pharynx, larynx and major salivary glands. No cervical lymphadenopathy. Unremarkable thyroid gland. UPPER CHEST: No pneumothorax or pleural effusion. No nodules or masses. AORTIC ARCH: There is mild calcific atherosclerosis  of the aortic arch. There is no aneurysm, dissection or hemodynamically significant stenosis of the visualized portion of the aorta. Conventional 3 vessel aortic branching pattern. The visualized proximal subclavian arteries are widely patent. RIGHT CAROTID SYSTEM: No dissection, occlusion or aneurysm. There is mixed density atherosclerosis extending into the proximal ICA, resulting in less than 50% stenosis. LEFT CAROTID SYSTEM: Normal without aneurysm, dissection or stenosis. VERTEBRAL ARTERIES: Right dominant configuration. Both origins are clearly patent. There is no dissection, occlusion or flow-limiting stenosis to the skull base (V1-V3 segments). CTA HEAD FINDINGS POSTERIOR CIRCULATION: --Vertebral arteries: Normal V4 segments. --Posterior inferior cerebellar arteries (PICA): Patent origins from the vertebral arteries. --Anterior inferior cerebellar arteries (AICA): Patent origins from the basilar artery. --Basilar artery: Normal. --Superior cerebellar arteries: Normal. --Posterior cerebral arteries: Normal. Both originate from the basilar artery. Posterior communicating arteries (p-comm) are diminutive or absent. ANTERIOR CIRCULATION: --Intracranial internal carotid arteries: Normal. --Anterior cerebral arteries (ACA): Normal. Both A1 segments are present. Patent anterior communicating artery (a-comm). --Middle cerebral arteries (MCA): Normal. VENOUS SINUSES: As permitted by contrast timing, patent. ANATOMIC VARIANTS: None Review of the MIP images confirms the above findings. IMPRESSION: 1. No emergent large vessel occlusion or high-grade stenosis of the intracranial arteries. 2. No dissection, aneurysm or hemodynamically significant stenosis of the carotid or vertebral arteries. 3. Aortic Atherosclerosis (ICD10-I70.0). Electronically Signed   By: Ulyses Jarred M.D.   On: 07/30/2019 22:16   Mr Angio Head Wo Contrast  Result Date: 07/31/2019 CLINICAL DATA:  Leukemia. Left-sided weakness. EXAM: MRI HEAD  WITHOUT CONTRAST MRA HEAD WITHOUT CONTRAST TECHNIQUE: Multiplanar, multiecho pulse sequences of the brain and surrounding structures were obtained without intravenous contrast. Angiographic images of the head were obtained using MRA technique without contrast. COMPARISON:  None. FINDINGS: MRI HEAD FINDINGS BRAIN: Small acute infarct of the superior right pons. Early confluent hyperintense T2-weighted signal of the periventricular and deep white matter, most commonly due to chronic ischemic microangiopathy. The cerebral and cerebellar volume are age-appropriate. There is no hydrocephalus. The midline structures are normal. VASCULAR: The major intracranial arterial and venous sinus flow voids are normal. Susceptibility-sensitive sequences show no chronic microhemorrhage or superficial siderosis. SKULL AND UPPER CERVICAL SPINE: Calvarial bone marrow signal is normal. There is no skull base mass. The visualized upper cervical spine and soft tissues are normal. SINUSES/ORBITS: There are no fluid levels or  advanced mucosal thickening. The mastoid air cells and middle ear cavities are free of fluid. The orbits are normal. MRA HEAD FINDINGS POSTERIOR CIRCULATION: --Vertebral arteries: Normal V4 segments. --Posterior inferior cerebellar arteries (PICA): Patent origins from the vertebral arteries. --Anterior inferior cerebellar arteries (AICA): Patent origins from the basilar artery. --Basilar artery: Normal. --Superior cerebellar arteries: Normal. --Posterior cerebral arteries: Normal. Both originate from the basilar artery. Posterior communicating arteries (p-comm) are diminutive or absent. ANTERIOR CIRCULATION: --Intracranial internal carotid arteries: Normal. --Anterior cerebral arteries (ACA): Normal. Both A1 segments are present. Patent anterior communicating artery (a-comm). --Middle cerebral arteries (MCA): Normal. IMPRESSION: 1. 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 No hemorrhage or mass effect. 2. Normal  intracranial MRA. 3. Chronic ischemic microangiopathy. Electronically Signed   By: Ulyses Jarred M.D.   On: 07/31/2019 00:29   Mr Brain Wo Contrast (neuro Protocol)  Result Date: 07/31/2019 CLINICAL DATA:  Leukemia. Left-sided weakness. EXAM: MRI HEAD WITHOUT CONTRAST MRA HEAD WITHOUT CONTRAST TECHNIQUE: Multiplanar, multiecho pulse sequences of the brain and surrounding structures were obtained without intravenous contrast. Angiographic images of the head were obtained using MRA technique without contrast. COMPARISON:  None. FINDINGS: MRI HEAD FINDINGS BRAIN: Small acute infarct of the superior right pons. Early confluent hyperintense T2-weighted signal of the periventricular and deep white matter, most commonly due to chronic ischemic microangiopathy. The cerebral and cerebellar volume are age-appropriate. There is no hydrocephalus. The midline structures are normal. VASCULAR: The major intracranial arterial and venous sinus flow voids are normal. Susceptibility-sensitive sequences show no chronic microhemorrhage or superficial siderosis. SKULL AND UPPER CERVICAL SPINE: Calvarial bone marrow signal is normal. There is no skull base mass. The visualized upper cervical spine and soft tissues are normal. SINUSES/ORBITS: There are no fluid levels or advanced mucosal thickening. The mastoid air cells and middle ear cavities are free of fluid. The orbits are normal. MRA HEAD FINDINGS POSTERIOR CIRCULATION: --Vertebral arteries: Normal V4 segments. --Posterior inferior cerebellar arteries (PICA): Patent origins from the vertebral arteries. --Anterior inferior cerebellar arteries (AICA): Patent origins from the basilar artery. --Basilar artery: Normal. --Superior cerebellar arteries: Normal. --Posterior cerebral arteries: Normal. Both originate from the basilar artery. Posterior communicating arteries (p-comm) are diminutive or absent. ANTERIOR CIRCULATION: --Intracranial internal carotid arteries: Normal. --Anterior  cerebral arteries (ACA): Normal. Both A1 segments are present. Patent anterior communicating artery (a-comm). --Middle cerebral arteries (MCA): Normal. IMPRESSION: 1. 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 No hemorrhage or mass effect. 2. Normal intracranial MRA. 3. Chronic ischemic microangiopathy. Electronically Signed   By: Ulyses Jarred M.D.   On: 07/31/2019 00:29   Ct Maxillofacial Wo Contrast  Result Date: 07/30/2019 CLINICAL DATA:  Status post fall with a blow to the head. Initial encounter. EXAM: CT HEAD WITHOUT CONTRAST CT MAXILLOFACIAL WITHOUT CONTRAST TECHNIQUE: Multidetector CT imaging of the head and maxillofacial structures were performed using the standard protocol without intravenous contrast. Multiplanar CT image reconstructions of the maxillofacial structures were also generated. COMPARISON:  None. FINDINGS: CT HEAD FINDINGS Brain: No evidence of acute infarction, hemorrhage, hydrocephalus, extra-axial collection or mass lesion/mass effect. Chronic microvascular ischemic change noted. Vascular: No hyperdense vessel or unexpected calcification. Skull: Intact.  No focal lesion. Other: Soft tissue contusion on the forehead is seen. CT MAXILLOFACIAL FINDINGS Osseous: No fracture or mandibular dislocation. No destructive process. Orbits: Negative. No traumatic or inflammatory finding. Status post cataract surgery. Sinuses: Tiny mucous retention cyst or polyp right maxillary sinus is noted. Trace bilateral mastoid effusions. Soft tissues: Negative. IMPRESSION: Soft tissue contusion  on the forehead without underlying fracture. No other acute abnormality head or face. Chronic microvascular ischemic change. Electronically Signed   By: Inge Rise M.D.   On: 07/30/2019 11:18       Medical Problem List and Plan: 1.  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  Antithrombotics: -DVT/anticoagulation:  Lovenox  -antiplatelet therapy: Aspirin 81 mg daily and Plavix 75 mg daily x3 weeks then aspirin alone 3. Pain Management: Tylenol as needed 4. Mood: Xanax 0.25 mg daily   Team to provide emotional support  -antipsychotic agents: N/A 5. Neuropsych: This patient is capable of making decisions on her own behalf. 6. Skin/Wound Care: Routine skin checks 7. Fluids/Electrolytes/Nutrition: Routine in and outs.  CMP ordered 8.  History of CLL.  Followed outpatient by Dr. Marko Plume.  Follow-up labs ordered. 9.  Permissive hypertension.  Patient on Lotrel 5-20 mg daily as needed prior to admission.  Monitor with increased mobility 10.  Thrombocytopenia  Follow-up CBC ordered  Cathlyn Parsons, PA-C 08/01/2019  I have personally performed a face to face diagnostic evaluation, including, but not limited to relevant history and physical exam findings, of this patient and developed relevant assessment and plan.  Additionally, I have reviewed and concur with the physician assistant's documentation above.  Delice Lesch, MD, ABPMR  The patient's status has not changed. The original post admission physician evaluation remains appropriate, and any changes from the pre-admission screening or documentation from the acute chart are noted above.   Delice Lesch, MD, ABPMR

## 2019-08-01 NOTE — PMR Pre-admission (Signed)
PMR Admission Coordinator Pre-Admission Assessment  Patient: Abigail Moreno is an 85 y.o., female MRN: [id number] DOB: July 23, 1929 Height: 5' (152.4 cm) Weight: 60.8 kg  Insurance Information HMO:     PPO: yes     PCP:      IPA:      80/20:      OTHER:  PRIMARY: [employer]      Policy#: [id number]      Subscriber: Patient CM Name: Abigail Moreno     Phone#: [phone]     Fax#: [phone] Pre-Cert#: [id number]      Employer:  [employer]m [employer]auf[employer] [id number] provided by Abigail Moreno on 11/13 for admit to CIR. Pt is approved for 7 days. HTA has epic access for clinical updates.  Benefits:  Phone #: [phone], option 1      Name: Abigail Moreno rep for call ref # [id number] Eff. Date: 09/18/2018 -012/31/2020     Deduct: no deductible ($0)      Out of Pocket Max: $3,400 ($1,141.01 met)      Life Max: NA CIR: $295/day co-pay for days 1-6, $0/day for days 7-90      SNF: $20/day co-pay for days 1-20, $160/day for days 21-100; limited to 100 days/benefit period Outpatient: limited by medical necessity     Co-Pay: $15/visit co-pay Home Health: 100% coverage; limited by medical necessity      Co-Pay: 0% co-insurance DME: 80% coverage     Co-Pay: 20% co-insurance Providers:  SECONDARY: None      Policy#:       Subscriber:  CM Name:       Phone#:     Fax#:  Pre-Cert#:       Employer:  Benefits:  Phone #:      Name:  Eff. Date:      Deduct:      Out of Pocket Max:       Life Max:  CIR:       SNF:  Outpatient:      Co-Pay:  Home Health:       Co-Pay:  DME:      Co-Pay:   Medicaid Application Date:       Case Manager:  Disability Application Date:       Case Worker:   The "Data Collection Information Summary" for patients in Inpatient Rehabilitation Facilities with attached "Privacy Act [place] Records" was provided and verbally reviewed with: Patient and Family  Emergency Contact Information Contact Information    Name Relation Home Work [place], [place] Daughter [phone]         Current Medical History  Patient Admitting Diagnosis: right pontine infarct  History of Present Illness: Abigail Moreno is an 85 year old female with history of CLL under observation followed by Dr. Marko Plume, hypertension as well as hyperlipidemia.  Per chart review patient lives alone independent driving prior to admission.  Her daughter lives local and can provide assistance as needed.  Presented 07/30/2019 with left side weakness as well as syncopal event landing on her face. Cranial CT scan showed no acute changes.  There was a soft tissue contusion on the forehead without underlying fracture.  CT maxillofacial negative for fracture.  Patient did not receive TPA.  CT angiogram of head and neck with no emergent large vessel occlusion or stenosis.  MRI of the brain showed small acute infarct of the superior right pons.  Echocardiogram with ejection fraction of 70% without emboli.  Neurology follow-up maintained on aspirin and Plavix x3 weeks then aspirin  alone.  Subcutaneous Lovenox for DVT prophylaxis.  Tolerating a regular diet.  Therapy evaluations completed and patient is to be admitted for a comprehensive rehab program on 08/01/2019.  Complete NIHSS TOTAL: 7  Patient's medical record from [facility] has been reviewed by the rehabilitation admission coordinator and physician.  Past Medical History  Past Medical History:  Diagnosis Date  . Anxiety   . Arthritis of knee  FALL 2012   RIGHT KNEE, hips, and fingers  . Breast cancer ([place]) FEBRUARY 2004   T1, NO, ER/PR POSITIVE LEFT BREAST  . CLL (chronic lymphocytic leukemia) ([place]) 2004   Dr. Marko Plume is oncologist  . Dyspnea    occ sob with exertion  . History of blood transfusion    with first and maybe second delivery  . Hyperlipemia   . Hypertension   . Macular degeneration    both eyes  . Osteoporosis    borderline  . UTI (urinary tract infection)    on nitrofuratonin bid x 7 days started 09-12-18 am     Family History   family history includes Cancer in her paternal grandmother and another family member; Heart attack in her father; High Cholesterol in her sister; Hypertension in her mother and sister; Pancreatic cancer in her maternal grandmother.  Prior Rehab/Hospitalizations Has the patient had prior rehab or hospitalizations prior to admission? Yes  Has the patient had major surgery during 100 days prior to admission? No   Current Medications  Current Facility-Administered Medications:  .  acetaminophen (TYLENOL) tablet 650 mg, 650 mg, Oral, Q4H PRN, 650 mg at 08/01/19 0950 **OR** acetaminophen (TYLENOL) 160 MG/5ML solution 650 mg, 650 mg, Per Tube, Q4H PRN **OR** acetaminophen (TYLENOL) suppository 650 mg, 650 mg, Rectal, Q4H PRN, Rise Patience, MD .  ALPRAZolam Duanne Moron) tablet 0.25 mg, 0.25 mg, Oral, Q6H PRN, Rise Patience, MD, 0.25 mg at 07/31/19 2145 .  aspirin EC tablet 81 mg, 81 mg, Oral, Daily, Biby, Sharon L, NP, 81 mg at 08/01/19 0939 .  clopidogrel (PLAVIX) tablet 75 mg, 75 mg, Oral, Daily, Biby, Sharon L, NP, 75 mg at 08/01/19 0939 .  enoxaparin (LOVENOX) injection 40 mg, 40 mg, Subcutaneous, Q24H, Rise Patience, MD, 40 mg at 08/01/19 0939 .  labetalol (NORMODYNE) injection 10 mg, 10 mg, Intravenous, Q2H PRN, Rise Patience, MD  Patients Current Diet:  Diet Order            Diet - low sodium heart healthy        Diet regular Room service appropriate? Yes; Fluid consistency: Thin  Diet effective now              Precautions / Restrictions Precautions Precautions: Fall(L hemi, L ribcage pain) Precaution Comments: lists to L side, weak L grip on walker Restrictions Weight Bearing Restrictions: No   Has the patient had 2 or more falls or a fall with injury in the past year? Yes  Prior Activity Level Limited Community (1-2x/wk): drives, does not get out too frequently. but Independent PTA, no AD  Prior Functional Level Self Care:  Did the patient need help bathing, dressing, using the toilet or eating? Independent  Indoor Mobility: Did the patient need assistance with walking from room to room (with or without device)? Independent  Stairs: Did the patient need assistance with internal or external stairs (with or without device)? Independent  Functional Cognition: Did the patient need help planning regular tasks such as shopping or remembering to take medications? Independent  Home Assistive Devices / Equipment Home Assistive Devices/Equipment: Cane (specify quad or straight), Eyeglasses, Walker (specify type) Home Equipment: Cane - single point, Walker - 2 wheels, Shower seat  Prior Device Use: Indicate devices/aids used by the patient prior to current illness, exacerbation or injury? None of the above  Current Functional Level Cognition  Overall Cognitive Status: Within Functional Limits for tasks assessed Orientation Level: Oriented X4 General Comments: A/O x4, follows commands and is aware she has L deficits    Extremity Assessment (includes Sensation/Coordination)  Upper Extremity Assessment: Defer to OT evaluation RUE Deficits / Details: R shoulder pt reports "rotator cuff injury"  RUE: (shoulder flexion limited ~100 degrees) LUE: Unable to fully assess due to pain(limited shoulder ROM to approx 100 degrees flexion) LUE Coordination: decreased fine motor, decreased gross motor  Lower Extremity Assessment: Generalized weakness, LLE deficits/detail LLE Deficits / Details: LLE is 4- strength LLE Coordination: decreased gross motor, decreased fine motor    ADLs  Overall ADL's : Needs assistance/impaired Grooming: Set up, Wash/dry face, Sitting Upper Body Bathing: Set up, Sitting Lower Body Bathing: Moderate assistance, Sitting/lateral leans Upper Body Dressing : Set up, Sitting Lower Body Dressing: Moderate assistance, Sitting/lateral leans Lower Body Dressing Details (indicate cue type and reason):  increased time/difficulty doffing L sock, unable to don safety without heavy forward leaning Toilet Transfer: Minimal assistance, Moderate assistance, Ambulation, BSC, RW, Cueing for safety, Cueing for sequencing Toilet Transfer Details (indicate cue type and reason): simulated with side stepping at EOB, difficulty coordination L LE and sequencing with rolling walker Toileting- Clothing Manipulation and Hygiene: Minimal assistance, Sit to/from stand Functional mobility during ADLs: Minimal assistance, Moderate assistance, Rolling walker, Cueing for safety, Cueing for sequencing    Mobility  Overal bed mobility: Needs Assistance Bed Mobility: Supine to Sit, Sit to Supine Supine to sit: Mod assist Sit to supine: Min assist, Mod assist General bed mobility comments: less help back to bed as pt can control descent with RUE    Transfers  Overall transfer level: Needs assistance Equipment used: Rolling walker (2 wheeled) Transfers: Sit to/from Stand Sit to Stand: Min assist, Mod assist General transfer comment: cues for hand placement, L grip weak but mod assist for first trial only    Ambulation / Gait / Stairs / Emergency planning/management officer  Ambulation/Gait Ambulation/Gait assistance: Herbalist (Feet): 6 Feet Assistive device: Rolling walker (2 wheeled), 1 person hand held assist Gait Pattern/deviations: Step-to pattern, Decreased stride length, Wide base of support General Gait Details: sidesteps with LLE struggling to move laterally and sits with mnimal warning Gait velocity: reduced Gait velocity interpretation: <1.8 ft/sec, indicate of risk for recurrent falls    Posture / Balance Dynamic Sitting Balance Sitting balance - Comments: loses balance backward with LE movement side of bed Balance Overall balance assessment: Needs assistance, History of Falls Sitting-balance support: Feet supported, Single extremity supported Sitting balance-Leahy Scale: Fair Sitting balance -  Comments: loses balance backward with LE movement side of bed Postural control: Posterior lean Standing balance support: Bilateral upper extremity supported, During functional activity Standing balance-Leahy Scale: Poor Standing balance comment: fatiguing with repeated standing to get bed changed    Special needs/care consideration BiPAP/CPAP : no CPM : no Continuous Drip IV : no Dialysis : no        Days : no Life Vest : no Oxygen : on RA Special Bed : no Trach Size : no Wound Vac (area) : no      Location : no Skin :  abrasion to head, lip, nose, rib (right, left, anterior, posterior), contact dermatitis, to bilateral breasts, ecchymosis to arm, head, lip, nose, rib (right, left, anterior, posterior), MASD to bilateral breasts. Pressure injury to coccyx (right; left) unstagable.                                Bowel mgmt: last BM: 07/30/2019, continent Bladder mgmt: continence  Diabetic mgmt: no Behavioral consideration : no Chemo/radiation : radiation in the past, nothing current   Previous Home Environment (from acute therapy documentation) Living Arrangements: Alone  Lives With: Alone Available Help at Discharge: Family, Available 24 hours/day Type of Home: House Home Layout: One level Home Access: Stairs to enter Entrance Stairs-Rails: None Entrance Stairs-Number of Steps: 1 onto patio then 1 into den [employer] Shower/Tub: Multimedia programmer: Handicapped height Bathroom Accessibility: Yes How Accessible: Accessible via walker Home Care Services: No Additional Comments: can have daughter stay with her, equip from prev hip surgery  Discharge Living Setting Plans for Discharge Living Setting: Other (Comment)(plan is to go to daughters house at DC) Type of Home at Discharge: House Discharge Home Layout: Two level, Able to live on main level with bedroom/bathroom Alternate Level Stairs-Rails: None(NA) Alternate Level Stairs-Number of Steps: NA Discharge Home  Access: Stairs to enter Entrance Stairs-Rails: Left Entrance Stairs-Number of Steps: 3  Discharge Bathroom Shower/Tub: Walk-in shower Discharge Bathroom Toilet: Standard Discharge Bathroom Accessibility: Yes How Accessible: Accessible via walker Does the patient have any problems obtaining your medications?: No  Social/Family/Support Systems Patient Roles: Other (Comment)(close family support) Contact Information: daugther: Cinda Quest [phone] Anticipated Caregiver: daughter Anticipated Caregiver's Contact Information: see above Ability/Limitations of Caregiver: min A Caregiver Availability: 24/7 Discharge Plan Discussed with Primary Caregiver: Yes Is Caregiver In Agreement with Plan?: Yes Does Caregiver/Family have Issues with Lodging/Transportation while Pt is in Rehab?: No  Goals/Additional Needs Patient/Family Goal for Rehab: PT/OT: Supervision; SLP :NA Expected length of stay: 5-8 days Cultural Considerations: NA Dietary Needs: regular diet, thin liquids  Equipment Needs: TBD Pt/Family Agrees to Admission and willing to participate: Yes Program Orientation Provided & Reviewed with Pt/Caregiver Including Roles  & Responsibilities: Yes(pt and daughter)  Barriers to Discharge: Home environment access/layout  Barriers to Discharge Comments: steps to enter  Decrease burden of Care through IP rehab admission: NA  Possible need for SNF placement upon discharge: Not anticipated; pt has great family support and plans to DC to her daughter's house who will provide 24/7 A. Pt is motivated to return home and anticipate she will be able to progress quickly through IP Rehab.   Patient Condition: I have reviewed medical records from [facility], spoken with RN, and patient and daughter. I met with patient at the bedside for inpatient rehabilitation assessment.  Patient will benefit from ongoing PT and OT, can actively participate in 3 hours of therapy a day 5 days of the  week, and can make measurable gains during the admission.  Patient will also benefit from the coordinated team approach during an Inpatient Acute Rehabilitation admission.  The patient will receive intensive therapy as well as Rehabilitation physician, nursing, social worker, and care management interventions.  Due to safety, skin/wound care, disease management, medication administration, pain management and patient education the patient requires 24 hour a day rehabilitation nursing.  The patient is currently Min A with mobility and basic ADLs.  Discharge setting and therapy post discharge at home with home health is  anticipated.  Patient has agreed to participate in the Acute Inpatient Rehabilitation Program and will admit today.  Preadmission Screen Completed By:  Raechel Ache, 08/01/2019 3:25 PM ______________________________________________________________________   Discussed status with Dr. Posey Pronto on 08/01/2019 at 3:23PM and received approval for admission today.  Admission Coordinator:  Raechel Ache, OT, time 3:23PM/Date 08/01/2019   Assessment/Plan: Diagnosis:  right pontine infarct  1. Does the need for close, 24 hr/day Medical supervision in concert with the patient's rehab needs make it unreasonable for this patient to be served in a less intensive setting? Potentially  2. Co-Morbidities requiring supervision/potential complications: CLL under observation followed by Dr. Marko Plume, hypertension as well as hyperlipidemia 3. Due to bladder management, safety, disease management and patient education, does the patient require 24 hr/day rehab nursing? Potentially 4. Does the patient require coordinated care of a physician, rehab nurse, PT, OT to address physical and functional deficits in the context of the above medical diagnosis(es)? Potentially Addressing deficits in the following areas: balance, endurance, locomotion, strength, transferring, bathing, dressing, toileting and psychosocial  support 5. Can the patient actively participate in an intensive therapy program of at least 3 hrs of therapy 5 days a week? Yes 6. The potential for patient to make measurable gains while on inpatient rehab is excellent 7. Anticipated functional outcomes upon discharge from inpatient rehab: supervision PT, supervision and min assist OT, n/a SLP 8. Estimated rehab length of stay to reach the above functional goals is: 4-7 days. 9. Anticipated discharge destination: Home 10. Overall Rehab/Functional Prognosis: good   MD Signature: Delice Lesch, MD, ABPMR

## 2019-08-01 NOTE — Progress Notes (Signed)
Abigail Arn, MD  Physician  Physical Medicine and Rehabilitation  PMR Pre-admission  Signed  Date of Service:  08/01/2019 3:08 PM      Related encounter: ED to Hosp-Admission (Discharged) from 07/30/2019 in [place] Admission Coordinator Pre-Admission Assessment  Patient: Abigail Moreno is an 85 y.o., female MRN: [id number] DOB: [birth year] Height: 5' (152.4 cm) Weight: 60.8 kg  Insurance Information HMO:     PPO: yes     PCP:      IPA:      80/20:      OTHER:  PRIMARY: [employer]      Policy#: [id number]      Subscriber: Patient CM Name: Abigail Moreno     Phone#: [phone]     Fax#: [phone] Pre-Cert#: [id number]      Employer:  Abigail Moreno [phone] provided by Abigail Moreno on 11/13 for admit to CIR. Pt is approved for 7 days. HTA has epic access for clinical updates.  Benefits:  Phone #: [phone], option 1      Name: Abigail Moreno for call ref # [id number] Eff. Date: 09/18/2018 -012/31/2020     Deduct: no deductible ($0)      Out of Pocket Max: $3,400 ($1,141.01 met)      Life Max: NA CIR: $295/day co-pay for days 1-6, $0/day for days 7-90      SNF: $20/day co-pay for days 1-20, $160/day for days 21-100; limited to 100 days/benefit period Outpatient: limited by medical necessity     Co-Pay: $15/visit co-pay Home Health: 100% coverage; limited by medical necessity      Co-Pay: 0% co-insurance DME: 80% coverage     Co-Pay: 20% co-insurance Providers:  SECONDARY: None      Policy#:       Subscriber:  CM Name:       Phone#:     Fax#:  Pre-Cert#:       Employer:  Benefits:  Phone #:      Name:  Eff. Date:      Deduct:      Out of Pocket Max:       Life Max:  CIR:       SNF:  Outpatient:      Co-Pay:  Home Health:       Co-Pay:  DME:      Co-Pay:   Medicaid Application Date:       Case Manager:  Disability Application Date:       Case Worker:   The "Data Collection Information Summary" for patients in Inpatient  Rehabilitation Facilities with attached "Privacy Act [place] Records" was provided and verbally reviewed with: Patient and Family  Emergency Contact Information         Contact Information    Name Relation Home Work Abigail Moreno, [place] Daughter [phone]        Current Medical History  Patient Admitting Diagnosis: right pontine infarct  History of Present Illness: Abigail Moreno is an 85 year old female with history of CLL under observation followed by Dr. Marko Moreno, hypertension as well as hyperlipidemia. Per chart review patient lives alone independent driving prior to admission. Her daughter lives local and can provide assistance as needed. Presented 07/30/2019 with left side weakness as well as syncopal eventlanding on her face. Cranial CT scan showed no acute changes. There was a soft tissue contusion on the forehead without underlying fracture.CT maxillofacial negative  for fracture.Patient did not receive TPA. CT angiogram of head and neck with no emergent large vessel occlusion or stenosis. MRI of the brain showed small acute infarct of the superior right pons. Echocardiogram with ejection fraction of 70% without emboli. Neurology follow-up maintained on aspirin and Plavix x3 weeks then aspirin alone. Subcutaneous Lovenox for DVT prophylaxis. Tolerating a regular diet. Therapy evaluations completed and patient is to be admitted for a comprehensive rehab program on 08/01/2019.  Complete NIHSS TOTAL: 7  Patient's medical record from Abigail Moreno has been reviewed by the rehabilitation admission coordinator and physician.  Past Medical History      Past Medical History:  Diagnosis Date  . Anxiety   . Arthritis of knee  FALL 2012   RIGHT KNEE, hips, and fingers  . Breast cancer ([place]) FEBRUARY 2004   T1, NO, ER/PR POSITIVE LEFT BREAST  . CLL (chronic lymphocytic leukemia) ([place]) 2004   Dr. Marko Moreno is oncologist  .  Dyspnea    occ sob with exertion  . History of blood transfusion    with first and maybe second delivery  . Hyperlipemia   . Hypertension   . Macular degeneration    both eyes  . Osteoporosis    borderline  . UTI (urinary tract infection)    on nitrofuratonin bid x 7 days started 09-12-18 am    Family History   family history includes Cancer in her paternal grandmother and another family member; Heart attack in her father; High Cholesterol in her sister; Hypertension in her mother and sister; Pancreatic cancer in her maternal grandmother.  Prior Rehab/Hospitalizations Has the patient had prior rehab or hospitalizations prior to admission? Yes  Has the patient had major surgery during 100 days prior to admission? No              Current Medications  Current Facility-Administered Medications:  .  acetaminophen (TYLENOL) tablet 650 mg, 650 mg, Oral, Q4H PRN, 650 mg at 08/01/19 0950 **OR** acetaminophen (TYLENOL) 160 MG/5ML solution 650 mg, 650 mg, Per Tube, Q4H PRN **OR** acetaminophen (TYLENOL) suppository 650 mg, 650 mg, Rectal, Q4H PRN, Rise Patience, MD .  ALPRAZolam Abigail Moreno) tablet 0.25 mg, 0.25 mg, Oral, Q6H PRN, Rise Patience, MD, 0.25 mg at 07/31/19 2145 .  aspirin EC tablet 81 mg, 81 mg, Oral, Daily, Moreno, Abigail L, NP, 81 mg at 08/01/19 0939 .  clopidogrel (PLAVIX) tablet 75 mg, 75 mg, Oral, Daily, Moreno, Abigail L, NP, 75 mg at 08/01/19 0939 .  enoxaparin (LOVENOX) injection 40 mg, 40 mg, Subcutaneous, Q24H, Rise Patience, MD, 40 mg at 08/01/19 0939 .  labetalol (NORMODYNE) injection 10 mg, 10 mg, Intravenous, Q2H PRN, Rise Patience, MD  Patients Current Diet:     Diet Order                  Diet - low sodium heart healthy         Diet regular Room service appropriate? Yes; Fluid consistency: Thin  Diet effective now               Precautions / Restrictions Precautions Precautions: Fall(L hemi, L ribcage  pain) Precaution Comments: lists to L side, weak L grip on walker Restrictions Weight Bearing Restrictions: No   Has the patient had 2 or more falls or a fall with injury in the past year? Yes  Prior Activity Level Limited Community (1-2x/wk): drives, does not get out too frequently. but Independent PTA, no AD  Prior Functional Level Self Care: Did the patient need help bathing, dressing, using the toilet or eating? Independent  Indoor Mobility: Did the patient need assistance with walking from room to room (with or without device)? Independent  Stairs: Did the patient need assistance with internal or external stairs (with or without device)? Independent  Functional Cognition: Did the patient need help planning regular tasks such as shopping or remembering to take medications? [place] Devices/Equipment: Cane (specify quad or straight), Eyeglasses, Walker (specify type) Home Equipment: Cane - single point, Walker - 2 wheels, Shower seat  Prior Device Use: Indicate devices/aids used by the patient prior to current illness, exacerbation or injury? None of the above  Current Functional Level Cognition  Overall Cognitive Status: Within Functional Limits for tasks assessed Orientation Level: Oriented X4 General Comments: A/O x4, follows commands and is aware she has L deficits    Extremity Assessment (includes Sensation/Coordination)  Upper Extremity Assessment: Defer to OT evaluation RUE Deficits / Details: R shoulder pt reports "rotator cuff injury"  RUE: (shoulder flexion limited ~100 degrees) LUE: Unable to fully assess due to pain(limited shoulder ROM to approx 100 degrees flexion) LUE Coordination: decreased fine motor, decreased gross motor  Lower Extremity Assessment: Generalized weakness, LLE deficits/detail LLE Deficits / Details: LLE is 4- strength LLE Coordination: decreased gross motor, decreased fine  motor    ADLs  Overall ADL's : Needs assistance/impaired Grooming: Set up, Wash/dry face, Sitting Upper Body Bathing: Set up, Sitting Lower Body Bathing: Moderate assistance, Sitting/lateral leans Upper Body Dressing : Set up, Sitting Lower Body Dressing: Moderate assistance, Sitting/lateral leans Lower Body Dressing Details (indicate cue type and reason): increased time/difficulty doffing L sock, unable to don safety without heavy forward leaning Toilet Transfer: Minimal assistance, Moderate assistance, Ambulation, BSC, RW, Cueing for safety, Cueing for sequencing Toilet Transfer Details (indicate cue type and reason): simulated with side stepping at EOB, difficulty coordination L LE and sequencing with rolling walker Toileting- Clothing Manipulation and Hygiene: Minimal assistance, Sit to/from stand Functional mobility during ADLs: Minimal assistance, Moderate assistance, Rolling walker, Cueing for safety, Cueing for sequencing    Mobility  Overal bed mobility: Needs Assistance Bed Mobility: Supine to Sit, Sit to Supine Supine to sit: Mod assist Sit to supine: Min assist, Mod assist General bed mobility comments: less help back to bed as pt can control descent with RUE    Transfers  Overall transfer level: Needs assistance Equipment used: Rolling walker (2 wheeled) Transfers: Sit to/from Stand Sit to Stand: Min assist, Mod assist General transfer comment: cues for hand placement, L grip weak but mod assist for first trial only    Ambulation / Gait / Stairs / Emergency planning/management officer  Ambulation/Gait Ambulation/Gait assistance: Herbalist (Feet): 6 Feet Assistive device: Rolling walker (2 wheeled), 1 person hand held assist Gait Pattern/deviations: Step-to pattern, Decreased stride length, Wide base of support General Gait Details: sidesteps with LLE struggling to move laterally and sits with mnimal warning Gait velocity: reduced Gait velocity interpretation:  <1.8 ft/sec, indicate of risk for recurrent falls    Posture / Balance Dynamic Sitting Balance Sitting balance - Comments: loses balance backward with LE movement side of bed Balance Overall balance assessment: Needs assistance, History of Falls Sitting-balance support: Feet supported, Single extremity supported Sitting balance-Leahy Scale: Fair Sitting balance - Comments: loses balance backward with LE movement side of bed Postural control: Posterior lean Standing balance support: Bilateral upper extremity supported, During functional activity  Standing balance-Leahy Scale: Poor Standing balance comment: fatiguing with repeated standing to get bed changed    Special needs/care consideration BiPAP/CPAP : no CPM : no Continuous Drip IV : no Dialysis : no        Days : no Life Vest : no Oxygen : on RA Special Bed : no Trach Size : no Wound Vac (area) : no      Location : no Skin : abrasion to head, lip, nose, rib (right, left, anterior, posterior), contact dermatitis, to bilateral breasts, ecchymosis to arm, head, lip, nose, rib (right, left, anterior, posterior), MASD to bilateral breasts. Pressure injury to coccyx (right; left) unstagable.                                Bowel mgmt: last BM: 07/30/2019, [place] Bladder mgmt: continence  Diabetic mgmt: no Behavioral consideration : no Chemo/radiation : radiation in the past, nothing current   Previous Home Environment (from acute therapy documentation) Living Arrangements: Alone  Lives With: Alone Available Help at Discharge: Family, Available 24 hours/day Type of Home: House Home Layout: One level Home Access: Stairs to enter Entrance Stairs-Rails: None Entrance Stairs-Number of Steps: 1 onto patio then 1 into den [employer] Shower/Tub: Multimedia programmer: Handicapped height Bathroom Accessibility: Yes How Accessible: Accessible via walker Home Care Services: No Additional Comments: can have daughter stay with  her, equip from prev hip surgery  Discharge Living Setting Plans for Discharge Living Setting: Other (Comment)(plan is to go to daughters house at DC) Type of Home at Discharge: House Discharge Home Layout: Two level, Able to live on main level with bedroom/bathroom Alternate Level Stairs-Rails: None(NA) Alternate Level Stairs-Number of Steps: NA Discharge Home Access: Stairs to enter Entrance Stairs-Rails: Left Entrance Stairs-Number of Steps: 3  Discharge Bathroom Shower/Tub: Walk-in shower Discharge Bathroom Toilet: Standard Discharge Bathroom Accessibility: Yes How Accessible: Accessible via walker Does the patient have any problems obtaining your medications?: No  Social/Family/Support Systems Patient Roles: Other (Comment)(close family support) Contact Information: daugther: Cinda Quest [phone] Anticipated Caregiver: daughter Anticipated Caregiver's Contact Information: see above Ability/Limitations of Caregiver: min A Caregiver Availability: 24/7 Discharge Plan Discussed with Primary Caregiver: Yes Is Caregiver In Agreement with Plan?: Yes Does Caregiver/Family have Issues with Lodging/Transportation while Pt is in Rehab?: No  Goals/Additional Needs Patient/Family Goal for Rehab: PT/OT: Supervision; SLP :NA Expected length of stay: 5-8 days Cultural Considerations: NA Dietary Needs: regular diet, thin liquids  Equipment Needs: TBD Pt/Family Agrees to Admission and willing to participate: Yes Program Orientation Provided & Reviewed with Pt/Caregiver Including Roles  & Responsibilities: Yes(pt and daughter)  Barriers to Discharge: Home environment access/layout  Barriers to Discharge Comments: steps to enter  Decrease burden of Care through IP rehab admission: NA  Possible need for SNF placement upon discharge: Not anticipated; pt has great family support and plans to DC to her daughter's house who will provide 24/7 A. Pt is motivated to return home and anticipate  she will be able to progress quickly through IP Rehab.   Patient Condition: I have reviewed medical records from [facility], spoken with RN, and patient and daughter. I met with patient at the bedside for inpatient rehabilitation assessment.  Patient will benefit from ongoing PT and OT, can actively participate in 3 hours of therapy a day 5 days of the week, and can make measurable gains during the admission.  Patient will also benefit from the coordinated  team approach during an Inpatient Acute Rehabilitation admission.  The patient will receive intensive therapy as well as Rehabilitation physician, nursing, social worker, and care management interventions.  Due to safety, skin/wound care, disease management, medication administration, pain management and patient education the patient requires 24 hour a day rehabilitation nursing.  The patient is currently Min A with mobility and basic ADLs.  Discharge setting and therapy post discharge at home with home health is anticipated.  Patient has agreed to participate in the Acute Inpatient Rehabilitation Program and will admit today.  Preadmission Screen Completed By:  Raechel Ache, 08/01/2019 3:25 PM ______________________________________________________________________   Discussed status with Dr. Posey Pronto on 08/01/2019 at 3:23PM and received approval for admission today.  Admission Coordinator:  Raechel Ache, OT, time 3:23PM/Date 08/01/2019   Assessment/Plan: Diagnosis:  right pontine infarct  1. Does the need for close, 24 hr/day Medical supervision in concert with the patient's rehab needs make it unreasonable for this patient to be served in a less intensive setting? Potentially  2. Co-Morbidities requiring supervision/potential complications: CLL under observation followed by Dr. Marko Moreno, hypertension as well as hyperlipidemia 3. Due to bladder management, safety, disease management and patient education, does the patient require  24 hr/day rehab nursing? Potentially 4. Does the patient require coordinated care of a physician, rehab nurse, PT, OT to address physical and functional deficits in the context of the above medical diagnosis(es)? Potentially Addressing deficits in the following areas: balance, endurance, locomotion, strength, transferring, bathing, dressing, toileting and psychosocial support 5. Can the patient actively participate in an intensive therapy program of at least 3 hrs of therapy 5 days a week? Yes 6. The potential for patient to make measurable gains while on inpatient rehab is excellent 7. Anticipated functional outcomes upon discharge from inpatient rehab: supervision PT, supervision and min assist OT, n/a SLP 8. Estimated rehab length of stay to reach the above functional goals is: 4-7 days. 9. Anticipated discharge destination: Home 10. Overall Rehab/Functional Prognosis: good   MD Signature: Delice Lesch, MD, ABPMR        Revision History Date/Time User Provider Type Action  08/01/2019 3:49 PM Abigail Arn, MD Physician Sign  08/01/2019 3:38 PM Abigail Arn, MD Physician Share  08/01/2019 3:26 PM Raechel Ache, [place] [facility] Admission Coordinator Share  View Details Report

## 2019-08-01 NOTE — Discharge Summary (Signed)
Physician Discharge Summary  Abigail Moreno [id number] DOB: 15-Aug-1929 DOA: 07/30/2019  PCP: Mayra Neer, MD  Admit date: 07/30/2019  Discharge date: 08/01/2019  Admitted From:Home  Disposition:  CIR  Recommendations for Outpatient Follow-up:  1. Follow up with PCP in 1-2 weeks 2. Continue on DAPT for 3 weeks and then ASA thereafter; pt is intolerant to statin and will continue fish oil 3. Continue other home medications as prior  Home Health:None  Equipment/Devices:DNR  Discharge Condition:Stable  CODE STATUS: Full  Diet recommendation: Heart Healthy  Brief/Interim Summary: Per HPI: Abigail S Blankenshipis a 86 y.o.femalewithhistory of CLL under observation, hypertension takes medication only as necessary, [name] Patient started noticing weakness of the left upper and lower extremity yesterday morning July 29, 2019. Patient had gone to her dentist appointment with her daughter and was noticed to have some gait difficulties and patient did not want to come to the ER. This morning when patient woke up from sleep she was trying to walk to get a drink when patient lost consciousness and passed out and fell onto her face. Patient was brought to the ER.  11/12: Patient noted to have acute infarct of the superior right pons with resultant left-sided hemiparesis.  Work-up still pending with neurology evaluation appreciated.  PT/OT evaluation ongoing.  With recommendations so far for CIR.  11/13: Patient is stable for transfer to CIR today. She is to continue on DAPT as recommended by Neurology for 3 weeks and then ASA thereafter. No other acute events or concerns noted during this admission. She is stable for DC. LVEF 65-70% with no acute findings on 2D echo otherwise noted.  1. Acute right superior pons  CVA with noted left-sided hemiparesis.  Appreciate neurology evaluation.  2D echocardiogram with LVEF 65-70%.  LDL noted to be 130 and A1c is 5.0%.  Continue full dose aspirin.  Patient noted to be allergic to statins and will continue on fish oil. 2. Syncope-not sure if patient passed out after the fall. Orthostatics and 2D echo wnl. 3. History of hypertension-patient takes blood pressure medication only if needed. For now will allow for permissive hypertension. As needed labetalol for systolic more than [id number] and diastolic more than [id number]. 4. History of CLL with chronic leukocytosis and thrombocytopenia; repeat cbc stable. 5. History of anxiety and depression presently takes as needed Xanax. 6. Nasal contusion after fall.  Discharge Diagnoses:  Principal Problem:   Acute CVA (cerebrovascular accident) ([place]) Active Problems:   CLL (chronic lymphocytic leukemia) ([place])   Breast cancer (HCC)   Thrombocytopenia (HCC)   Essential hypertension  Principle discharge diagnosis: Acute R superior pons CVA with L-sided hemiparesis.  Discharge Instructions  Discharge Instructions    Diet - low sodium heart healthy   Complete by: As directed    Increase activity slowly   Complete by: As directed      Allergies as of 08/01/2019      Reactions   Colesevelam Hcl Other (See Comments)    aches   Ezetimibe Other (See Comments)   aches   Procaine Hcl Swelling   nocaine=swelling   Rosuvastatin Other (See Comments)   aches   Simvastatin Other (See Comments)   aches   Statins Other (See Comments)   aches   Sulfa Antibiotics Other (See Comments)   Bacteria in intestines  Medication List    STOP taking these medications   methocarbamol 500 MG tablet Commonly known as: Robaxin   rivaroxaban 10 MG Tabs tablet Commonly known as: Xarelto     TAKE these medications   ALPRAZolam 0.25 MG tablet Commonly known as: XANAX Take 1 tablet (0.25 mg total) by mouth every 6 (six) hours as  needed for anxiety.   amLODipine-benazepril 5-20 MG capsule Commonly known as: LOTREL Take 1 capsule by mouth daily as needed (elevated blood pressure).   aspirin 81 MG EC tablet Take 1 tablet (81 mg total) by mouth daily.   CALCIUM 500 + D3 PO Take 2 tablets by mouth daily.   Cinnamon 500 MG capsule Take 1,000 mg by mouth daily.   clopidogrel 75 MG tablet Commonly known as: PLAVIX Take 1 tablet (75 mg total) by mouth daily for 21 days.   CRANBERRY PO Take 4,200 mg by mouth daily.   docusate sodium 100 MG capsule Commonly known as: Colace Take 1 capsule (100 mg total) by mouth 2 (two) times daily.   ferrous sulfate 325 (65 FE) MG tablet Commonly known as: FerrouSul Take 1 tablet (325 mg total) by mouth 3 (three) times daily with meals.   Fish Oil 1000 MG Caps Take 1,000 mg by mouth 2 (two) times daily.   HYDROcodone-acetaminophen 7.5-325 MG tablet Commonly known as: Norco Take 1-2 tablets by mouth every 4 (four) hours as needed for up to 3 days for moderate pain. What changed: Another medication with the same name was removed. Continue taking this medication, and follow the directions you see here.   loratadine 10 MG tablet Commonly known as: CLARITIN Take 10 mg by mouth daily at 12 noon.   multivitamin with minerals Tabs tablet Take 1 tablet by mouth daily. Women's One A Day 50+ Multivitamin   nitrofurantoin 100 MG capsule Commonly known as: MACRODANTIN Take 100 mg by mouth 2 (two) times daily. X 7 days started am 09-12-18   polyethylene glycol 17 g packet Commonly known as: MIRALAX / GLYCOLAX Take 17 g by mouth 2 (two) times daily.   pramoxine 1 % foam Commonly known as: Proctofoam Use as directed for hemorrhoids What changed:   how much to take  how to take this  when to take this  reasons to take this   PRESERVISION AREDS 2 PO Take 1 tablet by mouth 2 (two) times daily.   sertraline 50 MG tablet Commonly known as: ZOLOFT Take 25 mg by mouth  at bedtime.   tobramycin 0.3 % ophthalmic solution Commonly known as: TOBREX Place 1 drop into the left eye See admin instructions. Use 4 drop into left eye 4 times daily the day before, the day of, and the day after eye injection      Follow-up Information    Mayra Neer, MD Follow up in 1 week(s).   Specialty: Family Medicine Contact information: [address] [place] [address] [phone]          Allergies  Allergen Reactions  . Colesevelam Hcl Other (See Comments)     aches  . Ezetimibe Other (See Comments)    aches  . Procaine Hcl Swelling    nocaine=swelling  . Rosuvastatin Other (See Comments)    aches  . Simvastatin Other (See Comments)    aches  . Statins Other (See Comments)    aches  . Sulfa Antibiotics Other (See Comments)    Bacteria in intestines     Consultations:  Neurology   Procedures/Studies: Ct Angio Head W Or Wo Contrast  Result Date: 07/30/2019 CLINICAL DATA:  Dizziness and syncope. Head trauma. EXAM: CT ANGIOGRAPHY HEAD AND NECK TECHNIQUE: Multidetector CT imaging of the head and neck was performed using the standard protocol during bolus administration of intravenous contrast. Multiplanar CT image reconstructions and MIPs were obtained to evaluate the vascular anatomy. Carotid stenosis measurements (when applicable) are obtained utilizing NASCET criteria, using the distal internal carotid diameter as the denominator. CONTRAST:  73mL OMNIPAQUE IOHEXOL 350 MG/ML SOLN COMPARISON:  None. FINDINGS: CTA NECK FINDINGS SKELETON: There is no bony spinal canal stenosis. No lytic or blastic lesion. OTHER NECK: Normal pharynx, larynx and major salivary glands. No cervical lymphadenopathy. Unremarkable thyroid gland. UPPER CHEST: No pneumothorax or pleural effusion. No nodules or masses. AORTIC ARCH: There is mild calcific atherosclerosis of the aortic arch. There is no aneurysm, dissection or hemodynamically significant stenosis of  the visualized portion of the aorta. Conventional 3 vessel aortic branching pattern. The visualized proximal subclavian arteries are widely patent. RIGHT CAROTID SYSTEM: No dissection, occlusion or aneurysm. There is mixed density atherosclerosis extending into the proximal ICA, resulting in less than 50% stenosis. LEFT CAROTID SYSTEM: Normal without aneurysm, dissection or stenosis. VERTEBRAL ARTERIES: Right dominant configuration. Both origins are clearly patent. There is no dissection, occlusion or flow-limiting stenosis to the skull base (V1-V3 segments). CTA HEAD FINDINGS POSTERIOR CIRCULATION: --Vertebral arteries: Normal V4 segments. --Posterior inferior cerebellar arteries (PICA): Patent origins from the vertebral arteries. --Anterior inferior cerebellar arteries (AICA): Patent origins from the basilar artery. --Basilar artery: Normal. --Superior cerebellar arteries: Normal. --Posterior cerebral arteries: Normal. Both originate from the basilar artery. Posterior communicating arteries (p-comm) are diminutive or absent. ANTERIOR CIRCULATION: --Intracranial internal carotid arteries: Normal. --Anterior cerebral arteries (ACA): Normal. Both A1 segments are present. Patent anterior communicating artery (a-comm). --Middle cerebral arteries (MCA): Normal. VENOUS SINUSES: As permitted by contrast timing, patent. ANATOMIC VARIANTS: None Review of the MIP images confirms the above findings. IMPRESSION: 1. No emergent large vessel occlusion or high-grade stenosis of the intracranial arteries. 2. No dissection, aneurysm or hemodynamically significant stenosis of the carotid or vertebral arteries. 3. Aortic Atherosclerosis (ICD10-I70.0). Electronically Signed   By: Ulyses Jarred M.D.   On: 07/30/2019 22:16   Dg Chest 2 View  Result Date: 07/30/2019 CLINICAL DATA:  Left-sided chest pain. EXAM: CHEST - 2 VIEW COMPARISON:  02/28/2017 FINDINGS: The heart size and mediastinal contours are within normal limits. Both  lungs are clear. The visualized skeletal structures are unremarkable. Aortic atherosclerosis. IMPRESSION: 1. No active cardiopulmonary disease. 2. Aortic atherosclerosis. Electronically Signed   By: Lorriane Shire M.D.   On: 07/30/2019 18:50   Ct Head Wo Contrast  Result Date: 07/30/2019 CLINICAL DATA:  Status post fall with a blow to the head. Initial encounter. EXAM: CT HEAD WITHOUT CONTRAST CT MAXILLOFACIAL WITHOUT CONTRAST TECHNIQUE: Multidetector CT imaging of the head and maxillofacial structures were performed using the standard protocol without intravenous contrast. Multiplanar CT image reconstructions of the maxillofacial structures were also generated. COMPARISON:  None. FINDINGS: CT HEAD FINDINGS Brain: No evidence of acute infarction, hemorrhage, hydrocephalus, extra-axial collection or mass lesion/mass effect. Chronic microvascular ischemic change noted. Vascular: No hyperdense vessel or unexpected calcification. Skull: Intact.  No focal lesion. Other: Soft tissue contusion on the forehead is seen. CT MAXILLOFACIAL FINDINGS Osseous: No fracture or mandibular dislocation. No destructive process. Orbits: Negative. No traumatic or inflammatory finding. Status post cataract surgery. Sinuses: Tiny mucous retention cyst or polyp right maxillary sinus  is noted. Trace bilateral mastoid effusions. Soft tissues: Negative. IMPRESSION: Soft tissue contusion on the forehead without underlying fracture. No other acute abnormality head or face. Chronic microvascular ischemic change. Electronically Signed   By: Inge Rise M.D.   On: 07/30/2019 11:18   Ct Angio Neck W Or Wo Contrast  Result Date: 07/30/2019 CLINICAL DATA:  Dizziness and syncope. Head trauma. EXAM: CT ANGIOGRAPHY HEAD AND NECK TECHNIQUE: Multidetector CT imaging of the head and neck was performed using the standard protocol during bolus administration of intravenous contrast. Multiplanar CT image reconstructions and MIPs were obtained to  evaluate the vascular anatomy. Carotid stenosis measurements (when applicable) are obtained utilizing NASCET criteria, using the distal internal carotid diameter as the denominator. CONTRAST:  33mL OMNIPAQUE IOHEXOL 350 MG/ML SOLN COMPARISON:  None. FINDINGS: CTA NECK FINDINGS SKELETON: There is no bony spinal canal stenosis. No lytic or blastic lesion. OTHER NECK: Normal pharynx, larynx and major salivary glands. No cervical lymphadenopathy. Unremarkable thyroid gland. UPPER CHEST: No pneumothorax or pleural effusion. No nodules or masses. AORTIC ARCH: There is mild calcific atherosclerosis of the aortic arch. There is no aneurysm, dissection or hemodynamically significant stenosis of the visualized portion of the aorta. Conventional 3 vessel aortic branching pattern. The visualized proximal subclavian arteries are widely patent. RIGHT CAROTID SYSTEM: No dissection, occlusion or aneurysm. There is mixed density atherosclerosis extending into the proximal ICA, resulting in less than 50% stenosis. LEFT CAROTID SYSTEM: Normal without aneurysm, dissection or stenosis. VERTEBRAL ARTERIES: Right dominant configuration. Both origins are clearly patent. There is no dissection, occlusion or flow-limiting stenosis to the skull base (V1-V3 segments). CTA HEAD FINDINGS POSTERIOR CIRCULATION: --Vertebral arteries: Normal V4 segments. --Posterior inferior cerebellar arteries (PICA): Patent origins from the vertebral arteries. --Anterior inferior cerebellar arteries (AICA): Patent origins from the basilar artery. --Basilar artery: Normal. --Superior cerebellar arteries: Normal. --Posterior cerebral arteries: Normal. Both originate from the basilar artery. Posterior communicating arteries (p-comm) are diminutive or absent. ANTERIOR CIRCULATION: --Intracranial internal carotid arteries: Normal. --Anterior cerebral arteries (ACA): Normal. Both A1 segments are present. Patent anterior communicating artery (a-comm). --Middle cerebral  arteries (MCA): Normal. VENOUS SINUSES: As permitted by contrast timing, patent. ANATOMIC VARIANTS: None Review of the MIP images confirms the above findings. IMPRESSION: 1. No emergent large vessel occlusion or high-grade stenosis of the intracranial arteries. 2. No dissection, aneurysm or hemodynamically significant stenosis of the carotid or vertebral arteries. 3. Aortic Atherosclerosis (ICD10-I70.0). Electronically Signed   By: Ulyses Jarred M.D.   On: 07/30/2019 22:16   Mr Angio Head Wo Contrast  Result Date: 07/31/2019 CLINICAL DATA:  Leukemia. Left-sided weakness. EXAM: MRI HEAD WITHOUT CONTRAST MRA HEAD WITHOUT CONTRAST TECHNIQUE: Multiplanar, multiecho pulse sequences of the brain and surrounding structures were obtained without intravenous contrast. Angiographic images of the head were obtained using MRA technique without contrast. COMPARISON:  None. FINDINGS: MRI HEAD FINDINGS BRAIN: Small acute infarct of the superior right pons. Early confluent hyperintense T2-weighted signal of the periventricular and deep white matter, most commonly due to chronic ischemic microangiopathy. The cerebral and cerebellar volume are age-appropriate. There is no hydrocephalus. The midline structures are normal. VASCULAR: The major intracranial arterial and venous sinus flow voids are normal. Susceptibility-sensitive sequences show no chronic microhemorrhage or superficial siderosis. SKULL AND UPPER CERVICAL SPINE: Calvarial bone marrow signal is normal. There is no skull base mass. The visualized upper cervical spine and soft tissues are normal. SINUSES/ORBITS: There are no fluid levels or advanced mucosal thickening. The mastoid air cells and middle ear cavities are  free of fluid. The orbits are normal. MRA HEAD FINDINGS POSTERIOR CIRCULATION: --Vertebral arteries: Normal V4 segments. --Posterior inferior cerebellar arteries (PICA): Patent origins from the vertebral arteries. --Anterior inferior cerebellar arteries  (AICA): Patent origins from the basilar artery. --Basilar artery: Normal. --Superior cerebellar arteries: Normal. --Posterior cerebral arteries: Normal. Both originate from the basilar artery. Posterior communicating arteries (p-comm) are diminutive or absent. ANTERIOR CIRCULATION: --Intracranial internal carotid arteries: Normal. --Anterior cerebral arteries (ACA): Normal. Both A1 segments are present. Patent anterior communicating artery (a-comm). --Middle cerebral arteries (MCA): Normal. IMPRESSION: 1. 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 No hemorrhage or mass effect. 2. Normal intracranial MRA. 3. Chronic ischemic microangiopathy. Electronically Signed   By: Ulyses Jarred M.D.   On: 07/31/2019 00:29   Mr Brain Wo Contrast (neuro Protocol)  Result Date: 07/31/2019 CLINICAL DATA:  Leukemia. Left-sided weakness. EXAM: MRI HEAD WITHOUT CONTRAST MRA HEAD WITHOUT CONTRAST TECHNIQUE: Multiplanar, multiecho pulse sequences of the brain and surrounding structures were obtained without intravenous contrast. Angiographic images of the head were obtained using MRA technique without contrast. COMPARISON:  None. FINDINGS: MRI HEAD FINDINGS BRAIN: Small acute infarct of the superior right pons. Early confluent hyperintense T2-weighted signal of the periventricular and deep white matter, most commonly due to chronic ischemic microangiopathy. The cerebral and cerebellar volume are age-appropriate. There is no hydrocephalus. The midline structures are normal. VASCULAR: The major intracranial arterial and venous sinus flow voids are normal. Susceptibility-sensitive sequences show no chronic microhemorrhage or superficial siderosis. SKULL AND UPPER CERVICAL SPINE: Calvarial bone marrow signal is normal. There is no skull base mass. The visualized upper cervical spine and soft tissues are normal. SINUSES/ORBITS: There are no fluid levels or advanced mucosal thickening. The mastoid air cells and middle ear cavities  are free of fluid. The orbits are normal. MRA HEAD FINDINGS POSTERIOR CIRCULATION: --Vertebral arteries: Normal V4 segments. --Posterior inferior cerebellar arteries (PICA): Patent origins from the vertebral arteries. --Anterior inferior cerebellar arteries (AICA): Patent origins from the basilar artery. --Basilar artery: Normal. --Superior cerebellar arteries: Normal. --Posterior cerebral arteries: Normal. Both originate from the basilar artery. Posterior communicating arteries (p-comm) are diminutive or absent. ANTERIOR CIRCULATION: --Intracranial internal carotid arteries: Normal. --Anterior cerebral arteries (ACA): Normal. Both A1 segments are present. Patent anterior communicating artery (a-comm). --Middle cerebral arteries (MCA): Normal. IMPRESSION: 1. 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 No hemorrhage or mass effect. 2. Normal intracranial MRA. 3. Chronic ischemic microangiopathy. Electronically Signed   By: Ulyses Jarred M.D.   On: 07/31/2019 00:29   Ct Maxillofacial Wo Contrast  Result Date: 07/30/2019 CLINICAL DATA:  Status post fall with a blow to the head. Initial encounter. EXAM: CT HEAD WITHOUT CONTRAST CT MAXILLOFACIAL WITHOUT CONTRAST TECHNIQUE: Multidetector CT imaging of the head and maxillofacial structures were performed using the standard protocol without intravenous contrast. Multiplanar CT image reconstructions of the maxillofacial structures were also generated. COMPARISON:  None. FINDINGS: CT HEAD FINDINGS Brain: No evidence of acute infarction, hemorrhage, hydrocephalus, extra-axial collection or mass lesion/mass effect. Chronic microvascular ischemic change noted. Vascular: No hyperdense vessel or unexpected calcification. Skull: Intact.  No focal lesion. Other: Soft tissue contusion on the forehead is seen. CT MAXILLOFACIAL FINDINGS Osseous: No fracture or mandibular dislocation. No destructive process. Orbits: Negative. No traumatic or inflammatory finding. Status post  cataract surgery. Sinuses: Tiny mucous retention cyst or polyp right maxillary sinus is noted. Trace bilateral mastoid effusions. Soft tissues: Negative. IMPRESSION: Soft tissue contusion on the forehead without underlying fracture. No other acute abnormality head  or face. Chronic microvascular ischemic change. Electronically Signed   By: Inge Rise M.D.   On: 07/30/2019 11:18     Discharge Exam: Vitals:   08/01/19 0500 08/01/19 1249  BP: 140/79 130/79  Pulse: 79 78  Resp: 18 16  Temp: (!) 97.5 F (36.4 C) (!) 97.5 F (36.4 C)  SpO2: 93% 95%   Vitals:   07/31/19 2001 07/31/19 2356 08/01/19 0500 08/01/19 1249  BP: 110/73 119/70 140/79 130/79  Pulse: 87 78 79 78  Resp: 18 18 18 16   Temp: 98.7 F (37.1 C) 98.3 F (36.8 C) (!) 97.5 F (36.4 C) (!) 97.5 F (36.4 C)  TempSrc: Oral Oral Oral Oral  SpO2: 93% 93% 93% 95%  Weight:      Height:        General: Pt is alert, awake, not in acute distress Cardiovascular: RRR, S1/S2 +, no rubs, no gallops Respiratory: CTA bilaterally, no wheezing, no rhonchi Abdominal: Soft, NT, ND, bowel sounds + Extremities: no edema, no cyanosis    The results of significant diagnostics from this hospitalization (including imaging, microbiology, ancillary and laboratory) are listed below for reference.     Microbiology: Recent Results (from the past 240 hour(s))  Urine culture     Status: Abnormal (Preliminary result)   Collection Time: 07/30/19  7:44 PM   Specimen: Urine, Random  Result Value Ref Range Status   Specimen Description URINE, RANDOM  Final   Special Requests NONE  Final   Culture (A)  Final    ESCHERICHIA COLI CITROBACTER KOSERI SUSCEPTIBILITIES TO FOLLOW Performed at [facility], [address], [place], [place] [address]    Report Status PENDING  Incomplete  SARS CORONAVIRUS 2 (TAT 6-24 HRS) Nasopharyngeal Nasopharyngeal Swab     Status: None   Collection Time: 07/30/19  8:27 PM   Specimen: Nasopharyngeal  Swab  Result Value Ref Range Status   SARS Coronavirus 2 NEGATIVE NEGATIVE Final    Comment: (NOTE) SARS-CoV-2 target nucleic acids are NOT DETECTED. The SARS-CoV-2 RNA is generally detectable in upper and lower respiratory specimens during the acute phase of infection. Negative results do not preclude SARS-CoV-2 infection, do not rule out co-infections with other pathogens, and should not be used as the sole basis for treatment or other patient management decisions. Negative results must be combined with clinical observations, patient history, and epidemiological information. The expected result is Negative. Fact Sheet for Patients: SugarRoll.be Fact Sheet for Healthcare Providers: https://www.woods-mathews.com/ This test is not yet approved or cleared by the [place] FDA and  has been authorized for detection and/or diagnosis of SARS-CoV-2 by FDA under an Emergency Use Authorization (EUA). This EUA will remain  in effect (meaning this test can be used) for the duration of the COVID-19 declaration under Section 56 4(b)(1) of the Act, 21 U.S.C. section 360bbb-3(b)(1), unless the authorization is terminated or revoked sooner. Performed at [facility], [place] [address] [place], [place] [address]      Labs: BNP (last 3 results) No results for input(s): BNP in the last 8760 hours. Basic Metabolic Panel: Recent Labs  Lab 07/30/19 1005 07/30/19 1827 08/01/19 0536  NA 139 140 138  K 4.2 4.4 3.6  CL 105 104 103  CO2 24  --  24  GLUCOSE 100* 101* 86  BUN 7* 9 7*  CREATININE 0.63 0.50 0.51  CALCIUM 9.2  --  8.4*   Liver Function Tests: No results for input(s): AST, ALT, ALKPHOS, BILITOT, PROT,  ALBUMIN in the last 168 hours. No results for input(s): LIPASE, AMYLASE in the last 168 hours. No results for input(s): AMMONIA in the last 168 hours. CBC: Recent Labs  Lab 07/30/19 1005 07/30/19 1802 07/30/19 1827 08/01/19 0536   WBC 36.9* 38.4*  --  33.0*  NEUTROABS  --  19.2*  --   --   HGB 14.7 14.6 15.3* 12.9  HCT 45.9 44.8 45.0 39.7  MCV 93.9 93.5  --  93.2  PLT 106* 111*  --  93*   Cardiac Enzymes: No results for input(s): CKTOTAL, CKMB, CKMBINDEX, TROPONINI in the last 168 hours. BNP: Invalid input(s): POCBNP CBG: Recent Labs  Lab 07/31/19 1146 07/31/19 1616 07/31/19 2140 08/01/19 0506 08/01/19 1056  GLUCAP 107* 97 91 83 92   D-Dimer No results for input(s): DDIMER in the last 72 hours. Hgb A1c Recent Labs    07/31/19 0547  HGBA1C 5.0   Lipid Profile Recent Labs    07/31/19 0547  CHOL 188  HDL 45  LDLCALC 130*  TRIG 67  CHOLHDL 4.2   Thyroid function studies No results for input(s): TSH, T4TOTAL, T3FREE, THYROIDAB in the last 72 hours.  Invalid input(s): FREET3 Anemia work up No results for input(s): VITAMINB12, FOLATE, FERRITIN, TIBC, IRON, RETICCTPCT in the last 72 hours. Urinalysis    Component Value Date/Time   COLORURINE YELLOW 07/30/2019 1915   APPEARANCEUR CLEAR 07/30/2019 1915   LABSPEC 1.013 07/30/2019 1915   LABSPEC 1.010 01/07/2015 1114   PHURINE 6.0 07/30/2019 1915   GLUCOSEU NEGATIVE 07/30/2019 1915   GLUCOSEU Negative 01/07/2015 1114   HGBUR NEGATIVE 07/30/2019 1915   BILIRUBINUR NEGATIVE 07/30/2019 1915   BILIRUBINUR N 10/05/2015 1442   BILIRUBINUR Negative 01/07/2015 1114   KETONESUR 20 (A) 07/30/2019 1915   PROTEINUR NEGATIVE 07/30/2019 1915   UROBILINOGEN negative 10/05/2015 1442   UROBILINOGEN 0.2 01/07/2015 1114   NITRITE NEGATIVE 07/30/2019 1915   LEUKOCYTESUR TRACE (A) 07/30/2019 1915   LEUKOCYTESUR Negative 01/07/2015 1114   Sepsis Labs Invalid input(s): PROCALCITONIN,  WBC,  LACTICIDVEN Microbiology Recent Results (from the past 240 hour(s))  Urine culture     Status: Abnormal (Preliminary result)   Collection Time: 07/30/19  7:44 PM   Specimen: Urine, Random  Result Value Ref Range Status   Specimen Description URINE, RANDOM  Final    Special Requests NONE  Final   Culture (A)  Final    ESCHERICHIA COLI CITROBACTER KOSERI SUSCEPTIBILITIES TO FOLLOW Performed at [facility], [place] [address], [place], [place] [address]    Report Status PENDING  Incomplete  SARS CORONAVIRUS 2 (TAT 6-24 HRS) Nasopharyngeal Nasopharyngeal Swab     Status: None   Collection Time: 07/30/19  8:27 PM   Specimen: Nasopharyngeal Swab  Result Value Ref Range Status   SARS Coronavirus 2 NEGATIVE NEGATIVE Final    Comment: (NOTE) SARS-CoV-2 target nucleic acids are NOT DETECTED. The SARS-CoV-2 RNA is generally detectable in upper and lower respiratory specimens during the acute phase of infection. Negative results do not preclude SARS-CoV-2 infection, do not rule out co-infections with other pathogens, and should not be used as the sole basis for treatment or other patient management decisions. Negative results must be combined with clinical observations, patient history, and epidemiological information. The expected result is Negative. Fact Sheet for Patients: SugarRoll[employer]be Fact Sheet for Healthcare Providers: https://www.woods-mathews.com/ This test is not yet approved or cleared by the [place] FDA and  has been authorized for detection and/or diagnosis of SARS-CoV-2 by  FDA under an Emergency Use Authorization (EUA). This EUA will remain  in effect (meaning this test can be used) for the duration of the COVID-19 declaration under Section 56 4(b)(1) of the Act, 21 U.S.C. section 360bbb-3(b)(1), unless the authorization is terminated or revoked sooner. Performed at [facility], [place] [address] [place], [place] [address]      Time coordinating discharge: 40 minutes  SIGNED:   Rodena Goldmann, DO [facility] 08/01/2019, 1:50 PM  If 7PM-7AM, please contact night-coverage [email or web address]

## 2019-08-01 NOTE — H&P (Signed)
Physical Medicine and Rehabilitation Admission H&P    Chief Complaint  Patient presents with   Loss of Consciousness  : HPI: Abigail Moreno is an 85 year old right-handed female with history of CLL under observation followed by Dr. Marko Plume, hypertension as well as hyperlipidemia.  Per chart review and patient, patient lives alone and was independent prior to admission.  Her daughter lives local and can provide assistance as needed.  She presented 07/30/2019 with left-sided weakness and fall after syncopal episode landing on her face.  Cranial CT unremarkable for acute intracranial abnormalities.  There was a soft tissue contusion on the forehead without underlying fracture.  CT maxillofacial negative for fracture.  Patient did not receive TPA.  CT angiogram of head and neck with no emergent large vessel occlusion or stenosis.  MRI of the brain showed small acute infarct of the superior right pons.  Echocardiogram with ejection fraction of 70% without emboli.  Neurology recommended aspirin and Plavix x3 weeks, then aspirin alone.  Subcutaneous Lovenox for DVT prophylaxis.  Tolerating a regular diet.  Therapy evaluations completed and patient was admitted for a comprehensive rehab program.  Please see preadmission assessment from earlier today as well.  Review of Systems  Constitutional: Negative for chills and fever.  Respiratory: Positive for shortness of breath.   Cardiovascular: Negative for chest pain and palpitations.  Gastrointestinal: Positive for constipation. Negative for heartburn, nausea and vomiting.  Genitourinary: Negative for dysuria, flank pain and hematuria.  Musculoskeletal: Positive for joint pain and myalgias.  Skin: Negative for rash.  Neurological: Positive for dizziness and focal weakness.       Bouts of syncope  Psychiatric/Behavioral: The patient has insomnia.        Anxiety   Past Medical History:  Diagnosis Date   Anxiety    Arthritis of knee  FALL  2012   RIGHT KNEE, hips, and fingers   Breast cancer ([facility]) FEBRUARY 2004   T1, NO, ER/PR POSITIVE LEFT BREAST   CLL (chronic lymphocytic leukemia) ([facility]) 2004   Dr. Marko Plume is oncologist   Dyspnea    occ sob with exertion   History of blood transfusion    with first and maybe second delivery   Hyperlipemia    Hypertension    Macular degeneration    both eyes   Osteoporosis    borderline   UTI (urinary tract infection)    on nitrofuratonin bid x 7 days started 09-12-18 am   Past Surgical History:  Procedure Laterality Date   ABDOMINAL HYSTERECTOMY  1972   UNILATERAL OOPHORECTOMY( LEFT)   APPENDECTOMY     EYE SURGERY Bilateral    cataracts   INCONTINENCE SURGERY     MASTECTOMY PARTIAL / LUMPECTOMY W/ AXILLARY LYMPHADENECTOMY  FEBRUARY 2004   LEFT BREAST   OTHER SURGICAL HISTORY     cysts removed from breasts    TOTAL HIP ARTHROPLASTY  12/26/2011   Procedure: TOTAL HIP ARTHROPLASTY ANTERIOR APPROACH;  Surgeon: Mauri Pole, MD;  Location: [facility];  Service: Orthopedics;  Laterality: Right;   TOTAL HIP ARTHROPLASTY Left 09/17/2018   Procedure: TOTAL HIP ARTHROPLASTY ANTERIOR APPROACH;  Surgeon: Paralee Cancel, MD;  Location: [facility];  Service: Orthopedics;  Laterality: Left;  70 mins   Family History  Problem Relation Age of Onset   Pancreatic cancer Maternal Grandmother    Cancer Paternal Grandmother        ? type    Cancer Other        breast-1st paternal  cousin-no treatment, liver cancer-2nd maternal cousin   Hypertension Mother    Hypertension Sister        x2   High Cholesterol Sister        x2   Heart attack Father    Social History:  reports that she quit smoking about 60 years ago. Her smoking use included cigarettes. She has a 0.50 pack-year smoking history. She has never used smokeless tobacco. She reports that she does not drink alcohol or use drugs. Allergies:  Allergies  Allergen Reactions   Colesevelam Hcl Other (See Comments)      aches   Ezetimibe Other (See Comments)    aches   Procaine Hcl Swelling    nocaine=swelling   Rosuvastatin Other (See Comments)    aches   Simvastatin Other (See Comments)    aches   Statins Other (See Comments)    aches   Sulfa Antibiotics Other (See Comments)    Bacteria in intestines    Medications Prior to Admission  Medication Sig Dispense Refill   Calcium Carb-Cholecalciferol (CALCIUM 500 + D3 PO) Take 2 tablets by mouth daily.     Cinnamon 500 MG capsule Take 1,000 mg by mouth daily.      loratadine (CLARITIN) 10 MG tablet Take 10 mg by mouth daily at 12 noon.      Multiple Vitamin (MULTIVITAMIN WITH MINERALS) TABS tablet Take 1 tablet by mouth daily. Women's One A Day 50+ Multivitamin     Multiple Vitamins-Minerals (PRESERVISION AREDS 2 PO) Take 1 tablet by mouth 2 (two) times daily.     Omega-3 Fatty Acids (FISH OIL) 1000 MG CAPS Take 1,000 mg by mouth 2 (two) times daily.     tobramycin (TOBREX) 0.3 % ophthalmic solution Place 1 drop into the left eye See admin instructions. Use 4 drop into left eye 4 times daily the day before, the day of, and the day after eye injection  5   [DISCONTINUED] ALPRAZolam (XANAX) 0.25 MG tablet Take 0.25 mg by mouth every 6 (six) hours as needed for anxiety.   0   amLODipine-benazepril (LOTREL) 5-20 MG capsule Take 1 capsule by mouth daily as needed (elevated blood pressure).     CRANBERRY PO Take 4,200 mg by mouth daily.     docusate sodium (COLACE) 100 MG capsule Take 1 capsule (100 mg total) by mouth 2 (two) times daily. (Patient not taking: Reported on 07/30/2019) 10 capsule 0   ferrous sulfate (FERROUSUL) 325 (65 FE) MG tablet Take 1 tablet (325 mg total) by mouth 3 (three) times daily with meals. (Patient not taking: Reported on 07/30/2019)  3   HYDROcodone-acetaminophen (NORCO) 7.5-325 MG tablet Take 1-2 tablets by mouth every 4 (four) hours as needed for moderate pain. 60 tablet 0   methocarbamol (ROBAXIN) 500 MG tablet  Take 1 tablet (500 mg total) by mouth every 6 (six) hours as needed for muscle spasms. 40 tablet 0   nitrofurantoin (MACRODANTIN) 100 MG capsule Take 100 mg by mouth 2 (two) times daily. X 7 days started am 09-12-18     polyethylene glycol (MIRALAX / GLYCOLAX) packet Take 17 g by mouth 2 (two) times daily. (Patient not taking: Reported on 07/30/2019) 14 each 0   pramoxine (PROCTOFOAM) 1 % foam Use as directed for hemorrhoids (Patient taking differently: Place 1 application rectally every 12 (twelve) hours as needed (hemorrhoids). Use as directed for hemorrhoids) 15 g 1   rivaroxaban (XARELTO) 10 MG TABS tablet Take 1 tablet (10 mg total)  by mouth daily. 14 tablet 0   sertraline (ZOLOFT) 50 MG tablet Take 25 mg by mouth at bedtime.     [DISCONTINUED] HYDROcodone-acetaminophen (NORCO) 7.5-325 MG tablet Take 1-2 tablets by mouth every 4 (four) hours as needed for moderate pain. 60 tablet 0    Drug Regimen Review Drug regimen was reviewed and remains appropriate with no significant issues identified  Home: Home Living Family/patient expects to be discharged to:: Private residence Living Arrangements: Alone Available Help at Discharge: Family, Available 24 hours/day Type of Home: House Home Access: Stairs to enter [employer] of Steps: 1 onto patio then 1 into [employer] Insurance: None Home Layout: One level Bathroom Shower/Tub: Multimedia programmer: Handicapped height Bathroom Accessibility: Yes Home Equipment: [place] - single point, Environmental consultant - 2 wheels, Shower seat Additional Comments: can have daughter stay with her, equip from prev hip surgery   Functional History: Prior Function Level of Independence: Independent Comments: Patient drives in community as needed  Functional Status:  Mobility: Bed Mobility Overal bed mobility: Needs Assistance Bed Mobility: Supine to Sit, Sit to Supine Supine to sit: Mod assist Sit to supine: Min assist, Mod  assist General bed mobility comments: less help back to bed as pt can control descent with RUE Transfers Overall transfer level: Needs assistance Equipment used: Rolling walker (2 wheeled) Transfers: Sit to/from Stand Sit to Stand: Min assist, Mod assist General transfer comment: cues for hand placement, L grip weak but mod assist for first trial only Ambulation/Gait Ambulation/Gait assistance: Min Web designer (Feet): 6 Feet Assistive device: Rolling walker (2 wheeled), 1 person hand held assist Gait Pattern/deviations: Step-to pattern, Decreased stride length, Wide base of support General Gait Details: sidesteps with LLE struggling to move laterally and sits with mnimal warning Gait velocity: reduced Gait velocity interpretation: <1.8 ft/sec, indicate of risk for recurrent falls    ADL: ADL Overall ADL's : Needs assistance/impaired Grooming: Set up, Wash/dry face, Sitting Upper Body Bathing: Set up, Sitting Lower Body Bathing: Moderate assistance, Sitting/lateral leans Upper Body Dressing : Set up, Sitting Lower Body Dressing: Moderate assistance, Sitting/lateral leans Lower Body Dressing Details (indicate cue type and reason): increased time/difficulty doffing L sock, unable to don safety without heavy forward leaning Toilet Transfer: Minimal assistance, Moderate assistance, Ambulation, BSC, RW, Cueing for safety, Cueing for sequencing Toilet Transfer Details (indicate cue type and reason): simulated with side stepping at EOB, difficulty coordination L LE and sequencing with rolling walker Toileting- Clothing Manipulation and Hygiene: Minimal assistance, Sit to/from stand Functional mobility during ADLs: Minimal assistance, Moderate assistance, Rolling walker, Cueing for safety, Cueing for sequencing  Cognition: Cognition Overall Cognitive Status: Within Functional Limits for tasks assessed Orientation Level: Oriented to person, Oriented to place, Oriented to  situation Cognition Arousal/Alertness: Awake/alert Behavior During Therapy: Flat affect Overall Cognitive Status: Within Functional Limits for tasks assessed General Comments: A/O x4, follows commands and is aware she has L deficits  Physical Exam: Blood pressure 140/79, pulse 79, temperature (!) 97.5 F (36.4 C), temperature source Oral, resp. rate 18, height 5' (1.524 m), weight 60.8 kg, SpO2 93 %. Physical Exam  Vitals reviewed. Constitutional: She appears well-developed and well-nourished.  HENT:  Multiple bruises to the forehead and face with edema  Eyes: EOM are normal. Right eye exhibits no discharge. Left eye exhibits no discharge.  Neck: No tracheal deviation present. No thyromegaly present.  Respiratory: Effort normal. No respiratory distress.  GI: She exhibits no distension.  Musculoskeletal:     Comments: No edema  or tenderness in extremities  Neurological: She is alert.  Follows simple commands.   Provides her name and age however she does get confused on some dates and limited medical historian. HOH Motor: Right upper extremity/right lower extremity: 5/5 proximal distal Left upper extremity: Shoulder abduction, elbow flexion/extension 3+/5, handgrip 3/5 with apraxia Left lower extremity: 4 -/5 proximal distal Sensation intact light touch  Skin:  Facial abrasions  Psychiatric: Her affect is blunt. She is slowed.    Results for orders placed or performed during the hospital encounter of 07/30/19 (from the past 48 hour(s))  Basic metabolic panel     Status: Abnormal   Collection Time: 07/30/19 10:05 AM  Result Value Ref Range   Sodium 139 135 - 145 mmol/L   Potassium 4.2 3.5 - 5.1 mmol/L   Chloride 105 98 - 111 mmol/L   CO2 24 22 - 32 mmol/L   Glucose, Bld 100 (H) 70 - 99 mg/dL   BUN 7 (L) 8 - 23 mg/dL   Creatinine, Ser 0.63 0.44 - 1.00 mg/dL   Calcium 9.2 8.9 - 10.3 mg/dL   GFR calc non Af Amer >60 >60 mL/min   GFR calc Af Amer >60 >60 mL/min   Anion gap 10  5 - 15    Comment: Performed at [facility] [address] [place], [place] [address]  CBC     Status: Abnormal   Collection Time: 07/30/19 10:05 AM  Result Value Ref Range   WBC 36.9 (H) 4.0 - 10.5 K/uL   RBC 4.89 3.87 - 5.11 MIL/uL   Hemoglobin 14.7 12.0 - 15.0 g/dL   HCT 45.9 36.0 - 46.0 %   MCV 93.9 80.0 - 100.0 fL   MCH 30.1 26.0 - 34.0 pg   MCHC 32.0 30.0 - 36.0 g/dL   RDW 13.7 11.5 - 15.5 %   Platelets 106 (L) 150 - 400 K/uL    Comment: REPEATED TO VERIFY PLATELET COUNT CONFIRMED BY SMEAR Immature Platelet Fraction may be clinically indicated, consider ordering this additional test [id number]    nRBC 0.0 0.0 - 0.2 %    Comment: Performed at [facility], [place] [address], [place], [place] [address]  Ethanol     Status: None   Collection Time: 07/30/19  6:02 PM  Result Value Ref Range   Alcohol, Ethyl (B) <10 <10 mg/dL    Comment: (NOTE) Lowest detectable limit for serum alcohol is 10 mg/dL. For medical purposes only. Performed at [facility], [place] [address], [place], [place] [address]   Protime-INR     Status: None   Collection Time: 07/30/19  6:02 PM  Result Value Ref Range   Prothrombin Time 14.0 11.4 - 15.2 seconds   INR 1.1 0.8 - 1.2    Comment: (NOTE) INR goal varies based on device and disease states. Performed at [facility], [place] [address] [place], [place] [address]   APTT     Status: None   Collection Time: 07/30/19  6:02 PM  Result Value Ref Range   aPTT 30 24 - 36 seconds    Comment: Performed at [facility] [address] [place],  [address]  Differential     Status: Abnormal   Collection Time: 07/30/19  6:02 PM  Result Value Ref Range   Neutrophils Relative % 50 %   Neutro Abs 19.2 (H) 1.7 - 7.7 K/uL   Lymphocytes Relative 45 %   Lymphs Abs 17.3 (  H) 0.7 - 4.0 K/uL   Monocytes Relative 5 %   Monocytes Absolute 1.9 (H) 0.1 - 1.0 K/uL   Eosinophils Relative 0 %   Eosinophils Absolute 0.0 0.0 - 0.5  K/uL   Basophils Relative 0 %   Basophils Absolute 0.0 0.0 - 0.1 K/uL   Abs Immature Granulocytes 0.00 0.00 - 0.07 K/uL   Smudge Cells PRESENT     Comment: Performed at [facility] [address], [place], [place] [address]  CBC     Status: Abnormal   Collection Time: 07/30/19  6:02 PM  Result Value Ref Range   WBC 38.4 (H) 4.0 - 10.5 K/uL   RBC 4.79 3.87 - 5.11 MIL/uL   Hemoglobin 14.6 12.0 - 15.0 g/dL   HCT 44.8 36.0 - 46.0 %   MCV 93.5 80.0 - 100.0 fL   MCH 30.5 26.0 - 34.0 pg   MCHC 32.6 30.0 - 36.0 g/dL   RDW 13.8 11.5 - 15.5 %   Platelets 111 (L) 150 - 400 K/uL    Comment: REPEATED TO VERIFY Immature Platelet Fraction may be clinically indicated, consider ordering this additional test [id number] CONSISTENT WITH PREVIOUS RESULT    nRBC 0.0 0.0 - 0.2 %    Comment: Performed at [facility], [place] [address], [place], [place] [address]  I-stat chem 8, ED     Status: Abnormal   Collection Time: 07/30/19  6:27 PM  Result Value Ref Range   Sodium 140 135 - 145 mmol/L   Potassium 4.4 3.5 - 5.1 mmol/L   Chloride 104 98 - 111 mmol/L   BUN 9 8 - 23 mg/dL   Creatinine, Ser 0.50 0.44 - 1.00 mg/dL   Glucose, Bld 101 (H) 70 - 99 mg/dL   Calcium, Ion 1.11 (L) 1.15 - 1.40 mmol/L   TCO2 25 22 - 32 mmol/L   Hemoglobin 15.3 (H) 12.0 - 15.0 g/dL   HCT 45.0 36.0 - 46.0 %  Urine rapid drug screen (hosp performed)     Status: Abnormal   Collection Time: 07/30/19  7:11 PM  Result Value Ref Range   Opiates NONE DETECTED NONE DETECTED   Cocaine NONE DETECTED NONE DETECTED   Benzodiazepines POSITIVE (A) NONE DETECTED   Amphetamines NONE DETECTED NONE DETECTED   Tetrahydrocannabinol NONE DETECTED NONE DETECTED   Barbiturates NONE DETECTED NONE DETECTED    Comment: (NOTE) DRUG SCREEN FOR MEDICAL PURPOSES ONLY.  IF CONFIRMATION IS NEEDED FOR ANY PURPOSE, NOTIFY LAB WITHIN 5 DAYS. LOWEST DETECTABLE LIMITS FOR URINE DRUG SCREEN Drug Class                     Cutoff  (ng/mL) Amphetamine and metabolites    1000 Barbiturate and metabolites    200 Benzodiazepine                 [id number] Tricyclics and metabolites     300 Opiates and metabolites        300 Cocaine and metabolites        300 THC                            50 Performed at [facility], [place] [address], [place], [place] [address]   Urinalysis, Routine w reflex microscopic     Status: Abnormal   Collection Time: 07/30/19  7:15 PM  Result Value Ref Range   Color, Urine YELLOW YELLOW  APPearance CLEAR CLEAR   Specific Gravity, Urine 1.013 1.005 - 1.030   pH 6.0 5.0 - 8.0   Glucose, UA NEGATIVE NEGATIVE mg/dL   Hgb urine dipstick NEGATIVE NEGATIVE   Bilirubin Urine NEGATIVE NEGATIVE   Ketones, ur 20 (A) NEGATIVE mg/dL   Protein, ur NEGATIVE NEGATIVE mg/dL   Nitrite NEGATIVE NEGATIVE   Leukocytes,Ua TRACE (A) NEGATIVE   RBC / HPF 0-5 0 - 5 RBC/hpf   WBC, UA 6-10 0 - 5 WBC/hpf   Bacteria, UA RARE (A) NONE SEEN   Squamous Epithelial / LPF 0-5 0 - 5   Mucus PRESENT     Comment: Performed at [facility], [place] [address], [place], [place] [address]  Urine culture     Status: None (Preliminary result)   Collection Time: 07/30/19  7:44 PM   Specimen: Urine, Random  Result Value Ref Range   Specimen Description URINE, RANDOM    Special Requests NONE    Culture      CULTURE REINCUBATED FOR BETTER GROWTH Performed at [facility], [place] [address], [place], [place] [address]    Report Status PENDING   SARS CORONAVIRUS 2 (TAT 6-24 HRS) Nasopharyngeal Nasopharyngeal Swab     Status: None   Collection Time: 07/30/19  8:27 PM   Specimen: Nasopharyngeal Swab  Result Value Ref Range   SARS Coronavirus 2 NEGATIVE NEGATIVE    Comment: (NOTE) SARS-CoV-2 target nucleic acids are NOT DETECTED. The SARS-CoV-2 RNA is generally detectable in upper and lower respiratory specimens during the acute phase of infection. Negative results do not preclude SARS-CoV-2 infection, do not rule  out co-infections with other pathogens, and should not be used as the sole basis for treatment or other patient management decisions. Negative results must be combined with clinical observations, patient history, and epidemiological information. The expected result is Negative. Fact Sheet for Patients: SugarRoll.be Fact Sheet for Healthcare Providers: https://www.woods-mathews.com/ This test is not yet approved or cleared by the [place] FDA and  has been authorized for detection and/or diagnosis of SARS-CoV-2 by FDA under an Emergency Use Authorization (EUA). This EUA will remain  in effect (meaning this test can be used) for the duration of the COVID-19 declaration under Section 56 4(b)(1) of the Act, 21 U.S.C. section 360bbb-3(b)(1), unless the authorization is terminated or revoked sooner. Performed at [facility], [place] [address], [place], [place] [address]   Hemoglobin A1c     Status: None   Collection Time: 07/31/19  5:47 AM  Result Value Ref Range   Hgb A1c MFr Bld 5.0 4.8 - 5.6 %    Comment: (NOTE) Pre diabetes:          5.7%-6.4% Diabetes:              >6.4% Glycemic control for   <7.0% adults with diabetes    Mean Plasma Glucose 96.8 mg/dL    Comment: Performed at [facility] [address], [place],  [address]  Lipid panel     Status: Abnormal   Collection Time: 07/31/19  5:47 AM  Result Value Ref Range   Cholesterol 188 0 - 200 mg/dL   Triglycerides 67 <150 mg/dL   HDL 45 >40 mg/dL   Total CHOL/HDL Ratio 4.2 RATIO   VLDL 13 0 - 40 mg/dL   LDL Cholesterol 130 (H) 0 - 99 mg/dL    Comment:        Total Cholesterol/HDL:CHD Risk Coronary Heart Disease Risk  Table                     Men   Women  1/2 Average Risk   3.4   3.3  Average Risk       5.0   4.4  2 X Average Risk   9.6   7.1  3 X Average Risk  23.4   11.0        Use the calculated Patient Ratio above and the CHD Risk Table to  determine the patient's CHD Risk.        ATP III CLASSIFICATION (LDL):  <100     mg/dL   Optimal  100-129  mg/dL   Near or Above                    Optimal  130-159  mg/dL   Borderline  160-189  mg/dL   High  >190     mg/dL   Very High Performed at [facility] [address], [place], [place] [address]   Glucose, capillary     Status: None   Collection Time: 07/31/19  6:39 AM  Result Value Ref Range   Glucose-Capillary 90 70 - 99 mg/dL  Glucose, capillary     Status: Abnormal   Collection Time: 07/31/19 11:46 AM  Result Value Ref Range   Glucose-Capillary 107 (H) 70 - 99 mg/dL  Glucose, capillary     Status: None   Collection Time: 07/31/19  4:16 PM  Result Value Ref Range   Glucose-Capillary 97 70 - 99 mg/dL  Glucose, capillary     Status: None   Collection Time: 07/31/19  9:40 PM  Result Value Ref Range   Glucose-Capillary 91 70 - 99 mg/dL  Glucose, capillary     Status: None   Collection Time: 08/01/19  5:06 AM  Result Value Ref Range   Glucose-Capillary 83 70 - 99 mg/dL  AMCBC     Status: Abnormal   Collection Time: 08/01/19  5:36 AM  Result Value Ref Range   WBC 33.0 (H) 4.0 - 10.5 K/uL   RBC 4.26 3.87 - 5.11 MIL/uL   Hemoglobin 12.9 12.0 - 15.0 g/dL   HCT 39.7 36.0 - 46.0 %   MCV 93.2 80.0 - 100.0 fL   MCH 30.3 26.0 - 34.0 pg   MCHC 32.5 30.0 - 36.0 g/dL   RDW 13.9 11.5 - 15.5 %   Platelets 93 (L) 150 - 400 K/uL    Comment: REPEATED TO VERIFY SPECIMEN CHECKED FOR CLOTS Immature Platelet Fraction may be clinically indicated, consider ordering this additional test [id number] CONSISTENT WITH PREVIOUS RESULT    nRBC 0.0 0.0 - 0.2 %    Comment: Performed at [facility], [place] [address] [place], [place] [address]  AMBMP     Status: Abnormal   Collection Time: 08/01/19  5:36 AM  Result Value Ref Range   Sodium 138 135 - 145 mmol/L   Potassium 3.6 3.5 - 5.1 mmol/L   Chloride 103 98 - 111 mmol/L   CO2 24 22 - 32 mmol/L   Glucose, Bld 86 70 - 99  mg/dL   BUN 7 (L) 8 - 23 mg/dL   Creatinine, Ser 0.51 0.44 - 1.00 mg/dL   Calcium 8.4 (L) 8.9 - 10.3 mg/dL   GFR calc non Af Amer >60 >60 mL/min   GFR calc Af Amer >60 >60 mL/min   Anion gap 11 5 - 15  Comment: Performed at [facility], [place] [address], [place], [place] [address]   Ct Angio Head W Or Wo Contrast  Result Date: 07/30/2019 CLINICAL DATA:  Dizziness and syncope. Head trauma. EXAM: CT ANGIOGRAPHY HEAD AND NECK TECHNIQUE: Multidetector CT imaging of the head and neck was performed using the standard protocol during bolus administration of intravenous contrast. Multiplanar CT image reconstructions and MIPs were obtained to evaluate the vascular anatomy. Carotid stenosis measurements (when applicable) are obtained utilizing NASCET criteria, using the distal internal carotid diameter as the denominator. CONTRAST:  51mL OMNIPAQUE IOHEXOL 350 MG/ML SOLN COMPARISON:  None. FINDINGS: CTA NECK FINDINGS SKELETON: There is no bony spinal canal stenosis. No lytic or blastic lesion. OTHER NECK: Normal pharynx, larynx and major salivary glands. No cervical lymphadenopathy. Unremarkable thyroid gland. UPPER CHEST: No pneumothorax or pleural effusion. No nodules or masses. AORTIC ARCH: There is mild calcific atherosclerosis of the aortic arch. There is no aneurysm, dissection or hemodynamically significant stenosis of the visualized portion of the aorta. Conventional 3 vessel aortic branching pattern. The visualized proximal subclavian arteries are widely patent. RIGHT CAROTID SYSTEM: No dissection, occlusion or aneurysm. There is mixed density atherosclerosis extending into the proximal ICA, resulting in less than 50% stenosis. LEFT CAROTID SYSTEM: Normal without aneurysm, dissection or stenosis. VERTEBRAL ARTERIES: Right dominant configuration. Both origins are clearly patent. There is no dissection, occlusion or flow-limiting stenosis to the skull base (V1-V3 segments). CTA HEAD FINDINGS POSTERIOR  CIRCULATION: --Vertebral arteries: Normal V4 segments. --Posterior inferior cerebellar arteries (PICA): Patent origins from the vertebral arteries. --Anterior inferior cerebellar arteries (AICA): Patent origins from the basilar artery. --Basilar artery: Normal. --Superior cerebellar arteries: Normal. --Posterior cerebral arteries: Normal. Both originate from the basilar artery. Posterior communicating arteries (p-comm) are diminutive or absent. ANTERIOR CIRCULATION: --Intracranial internal carotid arteries: Normal. --Anterior cerebral arteries (ACA): Normal. Both A1 segments are present. Patent anterior communicating artery (a-comm). --Middle cerebral arteries (MCA): Normal. VENOUS SINUSES: As permitted by contrast timing, patent. ANATOMIC VARIANTS: None Review of the MIP images confirms the above findings. IMPRESSION: 1. No emergent large vessel occlusion or high-grade stenosis of the intracranial arteries. 2. No dissection, aneurysm or hemodynamically significant stenosis of the carotid or vertebral arteries. 3. Aortic Atherosclerosis (ICD10-I70.0). Electronically Signed   By: Ulyses Jarred M.D.   On: 07/30/2019 22:16   Dg Chest 2 View  Result Date: 07/30/2019 CLINICAL DATA:  Left-sided chest pain. EXAM: CHEST - 2 VIEW COMPARISON:  02/28/2017 FINDINGS: The heart size and mediastinal contours are within normal limits. Both lungs are clear. The visualized skeletal structures are unremarkable. Aortic atherosclerosis. IMPRESSION: 1. No active cardiopulmonary disease. 2. Aortic atherosclerosis. Electronically Signed   By: Lorriane Shire M.D.   On: 07/30/2019 18:50   Ct Head Wo Contrast  Result Date: 07/30/2019 CLINICAL DATA:  Status post fall with a blow to the head. Initial encounter. EXAM: CT HEAD WITHOUT CONTRAST CT MAXILLOFACIAL WITHOUT CONTRAST TECHNIQUE: Multidetector CT imaging of the head and maxillofacial structures were performed using the standard protocol without intravenous contrast. Multiplanar  CT image reconstructions of the maxillofacial structures were also generated. COMPARISON:  None. FINDINGS: CT HEAD FINDINGS Brain: No evidence of acute infarction, hemorrhage, hydrocephalus, extra-axial collection or mass lesion/mass effect. Chronic microvascular ischemic change noted. Vascular: No hyperdense vessel or unexpected calcification. Skull: Intact.  No focal lesion. Other: Soft tissue contusion on the forehead is seen. CT MAXILLOFACIAL FINDINGS Osseous: No fracture or mandibular dislocation. No destructive process. Orbits: Negative. No traumatic or inflammatory finding. Status post  cataract surgery. Sinuses: Tiny mucous retention cyst or polyp right maxillary sinus is noted. Trace bilateral mastoid effusions. Soft tissues: Negative. IMPRESSION: Soft tissue contusion on the forehead without underlying fracture. No other acute abnormality head or face. Chronic microvascular ischemic change. Electronically Signed   By: Inge Rise M.D.   On: 07/30/2019 11:18   Ct Angio Neck W Or Wo Contrast  Result Date: 07/30/2019 CLINICAL DATA:  Dizziness and syncope. Head trauma. EXAM: CT ANGIOGRAPHY HEAD AND NECK TECHNIQUE: Multidetector CT imaging of the head and neck was performed using the standard protocol during bolus administration of intravenous contrast. Multiplanar CT image reconstructions and MIPs were obtained to evaluate the vascular anatomy. Carotid stenosis measurements (when applicable) are obtained utilizing NASCET criteria, using the distal internal carotid diameter as the denominator. CONTRAST:  24mL OMNIPAQUE IOHEXOL 350 MG/ML SOLN COMPARISON:  None. FINDINGS: CTA NECK FINDINGS SKELETON: There is no bony spinal canal stenosis. No lytic or blastic lesion. OTHER NECK: Normal pharynx, larynx and major salivary glands. No cervical lymphadenopathy. Unremarkable thyroid gland. UPPER CHEST: No pneumothorax or pleural effusion. No nodules or masses. AORTIC ARCH: There is mild calcific atherosclerosis  of the aortic arch. There is no aneurysm, dissection or hemodynamically significant stenosis of the visualized portion of the aorta. Conventional 3 vessel aortic branching pattern. The visualized proximal subclavian arteries are widely patent. RIGHT CAROTID SYSTEM: No dissection, occlusion or aneurysm. There is mixed density atherosclerosis extending into the proximal ICA, resulting in less than 50% stenosis. LEFT CAROTID SYSTEM: Normal without aneurysm, dissection or stenosis. VERTEBRAL ARTERIES: Right dominant configuration. Both origins are clearly patent. There is no dissection, occlusion or flow-limiting stenosis to the skull base (V1-V3 segments). CTA HEAD FINDINGS POSTERIOR CIRCULATION: --Vertebral arteries: Normal V4 segments. --Posterior inferior cerebellar arteries (PICA): Patent origins from the vertebral arteries. --Anterior inferior cerebellar arteries (AICA): Patent origins from the basilar artery. --Basilar artery: Normal. --Superior cerebellar arteries: Normal. --Posterior cerebral arteries: Normal. Both originate from the basilar artery. Posterior communicating arteries (p-comm) are diminutive or absent. ANTERIOR CIRCULATION: --Intracranial internal carotid arteries: Normal. --Anterior cerebral arteries (ACA): Normal. Both A1 segments are present. Patent anterior communicating artery (a-comm). --Middle cerebral arteries (MCA): Normal. VENOUS SINUSES: As permitted by contrast timing, patent. ANATOMIC VARIANTS: None Review of the MIP images confirms the above findings. IMPRESSION: 1. No emergent large vessel occlusion or high-grade stenosis of the intracranial arteries. 2. No dissection, aneurysm or hemodynamically significant stenosis of the carotid or vertebral arteries. 3. Aortic Atherosclerosis (ICD10-I70.0). Electronically Signed   By: Ulyses Jarred M.D.   On: 07/30/2019 22:16   Mr Angio Head Wo Contrast  Result Date: 07/31/2019 CLINICAL DATA:  Leukemia. Left-sided weakness. EXAM: MRI HEAD  WITHOUT CONTRAST MRA HEAD WITHOUT CONTRAST TECHNIQUE: Multiplanar, multiecho pulse sequences of the brain and surrounding structures were obtained without intravenous contrast. Angiographic images of the head were obtained using MRA technique without contrast. COMPARISON:  None. FINDINGS: MRI HEAD FINDINGS BRAIN: Small acute infarct of the superior right pons. Early confluent hyperintense T2-weighted signal of the periventricular and deep white matter, most commonly due to chronic ischemic microangiopathy. The cerebral and cerebellar volume are age-appropriate. There is no hydrocephalus. The midline structures are normal. VASCULAR: The major intracranial arterial and venous sinus flow voids are normal. Susceptibility-sensitive sequences show no chronic microhemorrhage or superficial siderosis. SKULL AND UPPER CERVICAL SPINE: Calvarial bone marrow signal is normal. There is no skull base mass. The visualized upper cervical spine and soft tissues are normal. SINUSES/ORBITS: There are no fluid levels or  advanced mucosal thickening. The mastoid air cells and middle ear cavities are free of fluid. The orbits are normal. MRA HEAD FINDINGS POSTERIOR CIRCULATION: --Vertebral arteries: Normal V4 segments. --Posterior inferior cerebellar arteries (PICA): Patent origins from the vertebral arteries. --Anterior inferior cerebellar arteries (AICA): Patent origins from the basilar artery. --Basilar artery: Normal. --Superior cerebellar arteries: Normal. --Posterior cerebral arteries: Normal. Both originate from the basilar artery. Posterior communicating arteries (p-comm) are diminutive or absent. ANTERIOR CIRCULATION: --Intracranial internal carotid arteries: Normal. --Anterior cerebral arteries (ACA): Normal. Both A1 segments are present. Patent anterior communicating artery (a-comm). --Middle cerebral arteries (MCA): Normal. IMPRESSION: 1. 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 No hemorrhage or mass effect. 2. Normal  intracranial MRA. 3. Chronic ischemic microangiopathy. Electronically Signed   By: Ulyses Jarred M.D.   On: 07/31/2019 00:29   Mr Brain Wo Contrast (neuro Protocol)  Result Date: 07/31/2019 CLINICAL DATA:  Leukemia. Left-sided weakness. EXAM: MRI HEAD WITHOUT CONTRAST MRA HEAD WITHOUT CONTRAST TECHNIQUE: Multiplanar, multiecho pulse sequences of the brain and surrounding structures were obtained without intravenous contrast. Angiographic images of the head were obtained using MRA technique without contrast. COMPARISON:  None. FINDINGS: MRI HEAD FINDINGS BRAIN: Small acute infarct of the superior right pons. Early confluent hyperintense T2-weighted signal of the periventricular and deep white matter, most commonly due to chronic ischemic microangiopathy. The cerebral and cerebellar volume are age-appropriate. There is no hydrocephalus. The midline structures are normal. VASCULAR: The major intracranial arterial and venous sinus flow voids are normal. Susceptibility-sensitive sequences show no chronic microhemorrhage or superficial siderosis. SKULL AND UPPER CERVICAL SPINE: Calvarial bone marrow signal is normal. There is no skull base mass. The visualized upper cervical spine and soft tissues are normal. SINUSES/ORBITS: There are no fluid levels or advanced mucosal thickening. The mastoid air cells and middle ear cavities are free of fluid. The orbits are normal. MRA HEAD FINDINGS POSTERIOR CIRCULATION: --Vertebral arteries: Normal V4 segments. --Posterior inferior cerebellar arteries (PICA): Patent origins from the vertebral arteries. --Anterior inferior cerebellar arteries (AICA): Patent origins from the basilar artery. --Basilar artery: Normal. --Superior cerebellar arteries: Normal. --Posterior cerebral arteries: Normal. Both originate from the basilar artery. Posterior communicating arteries (p-comm) are diminutive or absent. ANTERIOR CIRCULATION: --Intracranial internal carotid arteries: Normal. --Anterior  cerebral arteries (ACA): Normal. Both A1 segments are present. Patent anterior communicating artery (a-comm). --Middle cerebral arteries (MCA): Normal. IMPRESSION: 1. 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 No hemorrhage or mass effect. 2. Normal intracranial MRA. 3. Chronic ischemic microangiopathy. Electronically Signed   By: Ulyses Jarred M.D.   On: 07/31/2019 00:29   Ct Maxillofacial Wo Contrast  Result Date: 07/30/2019 CLINICAL DATA:  Status post fall with a blow to the head. Initial encounter. EXAM: CT HEAD WITHOUT CONTRAST CT MAXILLOFACIAL WITHOUT CONTRAST TECHNIQUE: Multidetector CT imaging of the head and maxillofacial structures were performed using the standard protocol without intravenous contrast. Multiplanar CT image reconstructions of the maxillofacial structures were also generated. COMPARISON:  None. FINDINGS: CT HEAD FINDINGS Brain: No evidence of acute infarction, hemorrhage, hydrocephalus, extra-axial collection or mass lesion/mass effect. Chronic microvascular ischemic change noted. Vascular: No hyperdense vessel or unexpected calcification. Skull: Intact.  No focal lesion. Other: Soft tissue contusion on the forehead is seen. CT MAXILLOFACIAL FINDINGS Osseous: No fracture or mandibular dislocation. No destructive process. Orbits: Negative. No traumatic or inflammatory finding. Status post cataract surgery. Sinuses: Tiny mucous retention cyst or polyp right maxillary sinus is noted. Trace bilateral mastoid effusions. Soft tissues: Negative. IMPRESSION: Soft tissue contusion  on the forehead without underlying fracture. No other acute abnormality head or face. Chronic microvascular ischemic change. Electronically Signed   By: Inge Rise M.D.   On: 07/30/2019 11:18       Medical Problem List and Plan: 1.  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  Antithrombotics: -DVT/anticoagulation:  Lovenox  -antiplatelet therapy: Aspirin 81 mg daily and Plavix 75 mg daily x3 weeks then aspirin alone 3. Pain Management: Tylenol as needed 4. Mood: Xanax 0.25 mg daily   Team to provide emotional support  -antipsychotic agents: N/A 5. Neuropsych: This patient is capable of making decisions on her own behalf. 6. Skin/Wound Care: Routine skin checks 7. Fluids/Electrolytes/Nutrition: Routine in and outs.  CMP ordered 8.  History of CLL.  Followed outpatient by Dr. Marko Plume.  Follow-up labs ordered. 9.  Permissive hypertension.  Patient on Lotrel 5-20 mg daily as needed prior to admission.  Monitor with increased mobility 10.  Thrombocytopenia  Follow-up CBC ordered  Cathlyn Parsons, PA-C 08/01/2019  I have personally performed a face to face diagnostic evaluation, including, but not limited to relevant history and physical exam findings, of this patient and developed relevant assessment and plan.  Additionally, I have reviewed and concur with the physician assistant's documentation above.  Delice Lesch, MD, ABPMR

## 2019-08-01 NOTE — Progress Notes (Signed)
Inpatient Rehabilitation-Admissions Coordinator   I have received insurance approval and medical clearance from Dr. Manuella Ghazi for admit to CIR today. Pt and daughter aware and want to pursue CIR at this time. I have notified TOC team and RN.   Consent forms signed and insurance letter reviewed.   Please call if questions.   Jhonnie Garner, OTR/L  Rehab Admissions Coordinator  [phone] 08/01/2019 3:31 PM

## 2019-08-01 NOTE — Care Management Important Message (Signed)
Important Message  Patient Details  Name: Abigail Moreno MRN: [id number] Date of Birth: 1929-06-03   Medicare Important Message Given:  Yes     Shyne Resch [address] 08/01/2019, 2:43 PM

## 2019-08-02 ENCOUNTER — Inpatient Hospital Stay (HOSPITAL_COMMUNITY): Payer: PPO

## 2019-08-02 ENCOUNTER — Inpatient Hospital Stay (HOSPITAL_COMMUNITY): Payer: PPO | Admitting: Occupational Therapy

## 2019-08-02 DIAGNOSIS — I10 Essential (primary) hypertension: Secondary | ICD-10-CM

## 2019-08-02 DIAGNOSIS — I6350 Cerebral infarction due to unspecified occlusion or stenosis of unspecified cerebral artery: Secondary | ICD-10-CM

## 2019-08-02 DIAGNOSIS — Z856 Personal history of leukemia: Secondary | ICD-10-CM

## 2019-08-02 DIAGNOSIS — D696 Thrombocytopenia, unspecified: Secondary | ICD-10-CM

## 2019-08-02 LAB — URINE CULTURE: Culture: 50000 — AB

## 2019-08-02 NOTE — Evaluation (Signed)
Physical Therapy Assessment and Plan  Patient Details  Name: Abigail Moreno MRN: [id number] Date of Birth: [birth year]  PT Diagnosis: Abnormal posture, Difficulty walking, Hemiparesis non-dominant and Muscle weakness Rehab Potential: Good ELOS: 2-2.5 weeks   Today's Date: 08/02/2019 PT Individual Time: 0802-0900 PT Individual Time Calculation (min): 58 min    Problem List:  Patient Active Problem List   Diagnosis Date Noted  . Pressure injury of skin 08/01/2019  . Right pontine CVA ([place]) 08/01/2019  . Acute CVA (cerebrovascular accident) ([place]) 07/30/2019  . Essential hypertension 07/30/2019  . Overweight (BMI 25.0-29.9) 09/18/2018  . S/P left THA, AA 09/17/2018  . Environmental allergies 07/19/2016  . History of left breast cancer 10/23/2015  . Status post hysterectomy 06/15/2015  . Unresolved grief 06/15/2015  . Thrombocytopenia ([place]) 06/15/2015  . Need for prophylactic vaccination and inoculation against influenza 06/15/2015  . Mixed hyperlipidemia 03/16/2015  . Osteopenia 03/16/2015  . Osteoarthritis 03/16/2015  . Vaginal atrophy 03/16/2015  . Papanicolaou smear for cervical cancer screening 03/16/2015  . Macular degeneration 01/07/2015  . CLL (chronic lymphocytic leukemia) ([place]) 11/18/2011  . Breast cancer ([place]) 11/18/2011    Past Medical History:  Past Medical History:  Diagnosis Date  . Anxiety   . Arthritis of knee  FALL 2012   RIGHT KNEE, hips, and fingers  . Breast cancer ([place]) FEBRUARY 2004   T1, NO, ER/PR POSITIVE LEFT BREAST  . CLL (chronic lymphocytic leukemia) ([place]) 2004   Dr. Marko Plume is oncologist  . Dyspnea    occ sob with exertion  . History of blood transfusion    with first and maybe second delivery  . Hyperlipemia   . Hypertension   . Macular degeneration    both eyes  . Osteoporosis    borderline  . UTI (urinary tract infection)    on nitrofuratonin bid x 7 days started 09-12-18 am   Past Surgical History:  Past Surgical History:   Procedure Laterality Date  . ABDOMINAL HYSTERECTOMY  1972   UNILATERAL OOPHORECTOMY( LEFT)  . APPENDECTOMY    . EYE SURGERY Bilateral    cataracts  . INCONTINENCE SURGERY    . MASTECTOMY PARTIAL / LUMPECTOMY W/ AXILLARY LYMPHADENECTOMY  FEBRUARY 2004   LEFT BREAST  . OTHER SURGICAL HISTORY     cysts removed from breasts   . TOTAL HIP ARTHROPLASTY  12/26/2011   Procedure: TOTAL HIP ARTHROPLASTY ANTERIOR APPROACH;  Surgeon: Mauri Pole, MD;  Location: [facility];  Service: Orthopedics;  Laterality: Right;  . TOTAL HIP ARTHROPLASTY Left 09/17/2018   Procedure: TOTAL HIP ARTHROPLASTY ANTERIOR APPROACH;  Surgeon: Paralee Cancel, MD;  Location: [facility];  Service: Orthopedics;  Laterality: Left;  70 mins    Assessment & Plan Clinical Impression: Patient is a 85 y.o. year old female with history of CLL under observation followed by Dr. Marko Plume, hypertension as well as hyperlipidemia.  Per chart review and patient, patient lives alone and was independent prior to admission.  Her daughter lives local and can provide assistance as needed.  She presented 07/30/2019 with left-sided weakness and fall after syncopal episode landing on her face.  Cranial CT unremarkable for acute intracranial abnormalities.  There was a soft tissue contusion on the forehead without underlying fracture.  CT maxillofacial negative for fracture.  Patient did not receive TPA.  CT angiogram of head and neck with no emergent large vessel occlusion or stenosis.  MRI of the brain showed small acute infarct of the superior right pons.  Echocardiogram with ejection  fraction of 70% without emboli.  Neurology recommended aspirin and Plavix x3 weeks, then aspirin alone.  Subcutaneous Lovenox for DVT prophylaxis.  Tolerating a regular diet.  Therapy evaluations completed and patient was admitted for a comprehensive rehab program.  Please see preadmission assessment from earlier today as well.   Patient currently requires mod with mobility  secondary to muscle weakness and muscle joint tightness, decreased coordination and decreased standing balance, decreased postural control, hemiplegia and decreased balance strategies.  Prior to hospitalization, patient was independent  with mobility and lived with Alone in a House home.  Home access is 1 onto patio then 1 into denStairs to enter.  Patient will benefit from skilled PT intervention to maximize safe functional mobility, minimize fall risk and decrease caregiver burden for planned discharge home with 24 hour supervision.  Anticipate patient will benefit from follow up [place] at discharge.  PT - End of Session Activity Tolerance: Tolerates 30+ min activity with multiple rests Endurance Deficit: Yes PT Assessment Rehab Potential (ACUTE/IP ONLY): Good PT Patient demonstrates impairments in the following area(s): Balance;Behavior;Endurance;Motor;Safety;Sensory;Skin Integrity PT Transfers Functional Problem(s): Bed Mobility;Bed to Chair;Car;Furniture;Floor PT Locomotion Functional Problem(s): Ambulation;Wheelchair Mobility;Stairs PT Plan PT Intensity: Minimum of 1-2 x/day ,45 to 90 minutes PT Frequency: 5 out of 7 days PT Duration Estimated Length of Stay: 2-2.5 weeks PT Treatment/Interventions: Ambulation/gait training;DME/adaptive equipment instruction;Neuromuscular re-education;Psychosocial support;Stair training;UE/LE Strength taining/ROM;Balance/vestibular training;Discharge planning;Pain management;Skin care/wound management;Therapeutic Activities;UE/LE Coordination activities;Cognitive remediation/compensation;Disease management/prevention;Functional mobility training;Patient/family education;Splinting/orthotics;Therapeutic Exercise;Visual/perceptual remediation/compensation PT Transfers Anticipated Outcome(s): supervision PT Locomotion Anticipated Outcome(s): supervision PT Recommendation Follow Up Recommendations: Home health PT Patient destination: Home Equipment Recommended: To  be determined Equipment Details: pt has RW and [facility]  Skilled Therapeutic Intervention Evaluation completed (see details above and below) with education on PT POC and goals and individual treatment initiated with focus on functional mobility, transfers, gait an safety. Pt supine in bed upon PT arrival, agreeable to therapy tx an denies pain. Pt transferred to sitting EOB with min assist, brings LEs off the bed but requires assist for trunk elevation. Pt reports having to use bathroom, stand pivot to w/c with mod assist and stand pivot to toilet with mod assist, pt maintains standing balance with min assist while performing pericare and clothing management. Pt continent and incontinent, assist to change soiled brief. Pt transported to the gym in w/c. Pt performed car transfer this session with max assist to transfer, bring legs into car and lean forward to scoot hips back. Pt given narrower w/c for better fit, seat<>floor height too heigh, may need to be lowered next session to make scooting backwards in w/c easier. Pt requiring mod assist to scoot hips back in w/c this session. Pt ambulated x 10 ft without AD max assist and x 12 ft with RW and mod assist this session, therapist providing cues for increased step length and upright posture, also helping to guide w/c, decreased L foot clearance noted. Pt transported back to room and left in w/c with needs in reach and chair alarm set.   PT Evaluation Precautions/Restrictions Precautions Precautions: Fall Precaution Comments: L hemiparesis Restrictions Weight Bearing Restrictions: No General   Vital SignsTherapy Vitals Temp: (!) 97.5 F (36.4 C) Temp Source: Oral Pulse Rate: 77 BP: 128/83 Patient Position (if appropriate): Lying Oxygen Therapy SpO2: 93 % O2 Device: Room Air Pain   denies pain Home Living/Prior Functioning Home Living Available Help at Discharge: Family;Available 24 hours/day Type of Home: House Home Access: Stairs to  enter [employer] of Steps: 1  onto patio then 1 into [employer]: None Home Layout: One level Bathroom Shower/Tub: Multimedia programmer: Handicapped height Bathroom Accessibility: Yes Additional Comments: can have daughter stay with her  Lives With: Alone Prior Function Level of Independence: Independent with basic ADLs;Independent with gait;Independent with homemaking with ambulation  Able to Take Stairs?: Yes Driving: Yes Vocation: Retired Comments: Patient drives in community as needed, has RW and cane that she used only after hip replacement Cognition Overall Cognitive Status: Within Functional Limits for tasks assessed Orientation Level: Oriented X4 Attention: Sustained Sustained Attention: Appears intact Memory: Appears intact Awareness: Appears intact Safety/Judgment: Appears intact Sensation Sensation Light Touch: Appears Intact Proprioception: Appears Intact Additional Comments: sensation grossly intact B UEs/LEs Coordination Gross Motor Movements are Fluid and Coordinated: No Fine Motor Movements are Fluid and Coordinated: No Coordination and Movement Description: coordination impaired secondary to L sided weakness/hemiparesis Heel Shin Test: impaired L LE Motor  Motor Motor: Hemiplegia;Abnormal postural alignment and control Motor - Skilled Clinical Observations: L hemiparesis  Mobility Bed Mobility Bed Mobility: Supine to Sit;Sit to Supine Supine to Sit: Minimal Assistance - Patient > 75% Sit to Supine: Minimal Assistance - Patient > 75% Transfers Transfers: Sit to [employer] Transfers Sit to Stand: Moderate Assistance - Patient 50-74% Stand Pivot Transfers: Moderate Assistance - Patient 50 - 74% Stand Pivot Transfer Details: Verbal cues for technique;Verbal cues for precautions/safety;Manual facilitation for weight shifting Transfer (Assistive device): None Locomotion  Gait Ambulation: Yes Gait Assistance:  Maximal Assistance - Patient 25-49% Gait Distance (Feet): 10 Feet Assistive device: None Gait Assistance Details: Verbal cues for gait pattern;Verbal cues for technique;Manual facilitation for weight shifting;Verbal cues for precautions/safety Gait Gait: Yes Gait Pattern: Impaired Gait Pattern: Step-to pattern;Trunk flexed;Decreased stance time - left;Decreased step length - right;Poor foot clearance - left Gait velocity: reduced Stairs / Additional Locomotion Stairs: No  Trunk/Postural Assessment  Cervical Assessment Cervical Assessment: Exceptions to WFL(forward head posture) Thoracic Assessment Thoracic Assessment: Exceptions to WFL(kyphotic, rounded shoulders) Lumbar Assessment Lumbar Assessment: Exceptions to WFL(posterior pelvic tilt in sitting) Postural Control Postural Control: Deficits on evaluation Postural Limitations: posterior lean  Balance Balance Balance Assessed: Yes Static Sitting Balance Static Sitting - Level of Assistance: 5: Stand by assistance Dynamic Sitting Balance Dynamic Sitting - Level of Assistance: 5: Stand by assistance;4: Min assist Static Standing Balance Static Standing - Level of Assistance: 4: Min assist Dynamic Standing Balance Dynamic Standing - Level of Assistance: 3: Mod assist;2: Max assist Extremity Assessment  RLE Assessment RLE Assessment: Within Functional Limits General Strength Comments: strength grossly 4/5 throughout LLE Assessment LLE Assessment: Exceptions to [facility] Passive Range of Motion (PROM) Comments: limited ankle DF LLE Strength Left Hip Flexion: 2+/5 Left Hip Extension: 3-/5 Left Knee Flexion: 3-/5 Left Knee Extension: 2+/5 Left Ankle Dorsiflexion: 2+/5 Left Ankle Plantar Flexion: 2+/5    Refer to Care Plan for Long Term Goals  Recommendations for other services: None   Discharge Criteria: Patient will be discharged from PT if patient refuses treatment 3 consecutive times without medical reason, if treatment  goals not met, if there is a change in medical status, if patient makes no progress towards goals or if patient is discharged from hospital.  The above assessment, treatment plan, treatment alternatives and goals were discussed and mutually agreed upon: by patient  Netta Corrigan, PT, DPT, CSRS 08/02/2019, 9:00 AM

## 2019-08-02 NOTE — Evaluation (Signed)
Occupational Therapy Assessment and Plan  Patient Details  Name: Abigail Moreno MRN: [id number] Date of Birth: 25-Feb-1929  OT Diagnosis: hemiplegia affecting non-dominant side and muscle weakness (generalized) Rehab Potential: Rehab Potential (ACUTE ONLY): Good ELOS: ~2 weeks   Today's Date: 08/02/2019 OT Individual Time:  -  [phone] (60 min )       Problem List:  Patient Active Problem List   Diagnosis Date Noted  . Pressure injury of skin 08/01/2019  . Right pontine CVA ([place]) 08/01/2019  . Acute CVA (cerebrovascular accident) ([place]) 07/30/2019  . Essential hypertension 07/30/2019  . Overweight (BMI 25.0-29.9) 09/18/2018  . S/P left THA, AA 09/17/2018  . Environmental allergies 07/19/2016  . History of left breast cancer 10/23/2015  . Status post hysterectomy 06/15/2015  . Unresolved grief 06/15/2015  . Thrombocytopenia ([place]) 06/15/2015  . Need for prophylactic vaccination and inoculation against influenza 06/15/2015  . Mixed hyperlipidemia 03/16/2015  . Osteopenia 03/16/2015  . Osteoarthritis 03/16/2015  . Vaginal atrophy 03/16/2015  . Papanicolaou smear for cervical cancer screening 03/16/2015  . Macular degeneration 01/07/2015  . CLL (chronic lymphocytic leukemia) ([place]) 11/18/2011  . Breast cancer ([place]) 11/18/2011    Past Medical History:  Past Medical History:  Diagnosis Date  . Anxiety   . Arthritis of knee  FALL 2012   RIGHT KNEE, hips, and fingers  . Breast cancer ([place]) FEBRUARY 2004   T1, NO, ER/PR POSITIVE LEFT BREAST  . CLL (chronic lymphocytic leukemia) ([place]) 2004   Dr. Marko Plume is oncologist  . Dyspnea    occ sob with exertion  . History of blood transfusion    with first and maybe second delivery  . Hyperlipemia   . Hypertension   . Macular degeneration    both eyes  . Osteoporosis    borderline  . UTI (urinary tract infection)    on nitrofuratonin bid x 7 days started 09-12-18 am   Past Surgical History:  Past Surgical History:   Procedure Laterality Date  . ABDOMINAL HYSTERECTOMY  1972   UNILATERAL OOPHORECTOMY( LEFT)  . APPENDECTOMY    . EYE SURGERY Bilateral    cataracts  . INCONTINENCE SURGERY    . MASTECTOMY PARTIAL / LUMPECTOMY W/ AXILLARY LYMPHADENECTOMY  FEBRUARY 2004   LEFT BREAST  . OTHER SURGICAL HISTORY     cysts removed from breasts   . TOTAL HIP ARTHROPLASTY  12/26/2011   Procedure: TOTAL HIP ARTHROPLASTY ANTERIOR APPROACH;  Surgeon: Mauri Pole, MD;  Location: [facility];  Service: Orthopedics;  Laterality: Right;  . TOTAL HIP ARTHROPLASTY Left 09/17/2018   Procedure: TOTAL HIP ARTHROPLASTY ANTERIOR APPROACH;  Surgeon: Paralee Cancel, MD;  Location: [facility];  Service: Orthopedics;  Laterality: Left;  70 mins    Assessment & Plan Clinical Impression: Patient is a 85 y.o. year old female female with history of CLL under observation followed by Dr. Marko Plume, hypertension as well as hyperlipidemia.  Per chart review and patient, patient lives alone and was independent prior to admission.  Her daughter lives local and can provide assistance as needed.  She presented 07/30/2019 with left-sided weakness and fall after syncopal episode landing on her face.  Cranial CT unremarkable for acute intracranial abnormalities.  There was a soft tissue contusion on the forehead without underlying fracture.  CT maxillofacial negative for fracture.  Patient did not receive TPA.  CT angiogram of head and neck with no emergent large vessel occlusion or stenosis.  MRI of the brain showed small acute infarct of the superior  right pons.  Echocardiogram with ejection fraction of 70% without emboli.  Neurology recommended aspirin and Plavix x3 weeks, then aspirin alone.  Subcutaneous Lovenox for DVT prophylaxis.  Tolerating a regular diet.  Therapy evaluations completed and patient was admitted for a comprehensive rehab program.    Patient transferred to CIR on 08/01/2019 .    Patient currently requires mod with basic self-care skills  and mod A for basic self care tasks.  secondary to muscle weakness and acute left flank pain from fall, decreased cardiorespiratoy endurance, impaired timing and sequencing, abnormal tone, motor apraxia, decreased coordination and decreased motor planning and decreased sitting balance, decreased standing balance, hemiplegia and decreased balance strategies.  Prior to hospitalization, patient could complete ADL with independent .  Patient will benefit from skilled intervention to decrease level of assist with basic self-care skills and increase independence with basic self-care skills prior to discharge home with care partner.  Anticipate patient will require 24 hour supervision and follow up outpatient.  OT - End of Session Activity Tolerance: Tolerates 30+ min activity with multiple rests Endurance Deficit: Yes OT Assessment Rehab Potential (ACUTE ONLY): Good OT Patient demonstrates impairments in the following area(s): Balance;Endurance;Motor;Pain;Perception;Safety;Skin Integrity OT Basic ADL's Functional Problem(s): Grooming;Bathing;Dressing;Toileting OT Transfers Functional Problem(s): Toilet;Tub/Shower OT Additional Impairment(s): Fuctional Use of Upper Extremity OT Plan OT Intensity: Minimum of 1-2 x/day, 45 to 90 minutes OT Frequency: 5 out of 7 days OT Duration/Estimated Length of Stay: ~2 weeks OT Treatment/Interventions: Balance/vestibular training;Disease mangement/prevention;Neuromuscular re-education;Self Care/advanced ADL retraining;Therapeutic Exercise;Cognitive remediation/compensation;DME/adaptive equipment instruction;Pain management;Skin care/wound managment;UE/LE Strength taining/ROM;Wheelchair propulsion/positioning;Community reintegration;Patient/family education;UE/LE Coordination activities;Discharge planning;Functional mobility training;Psychosocial support;Therapeutic Activities OT Self Feeding Anticipated Outcome(s): n/a OT Basic Self-Care Anticipated Outcome(s):  supervision OT Toileting Anticipated Outcome(s): supervision OT Bathroom Transfers Anticipated Outcome(s): supervision OT Recommendation Patient destination: Home Follow Up Recommendations: Outpatient OT Equipment Recommended: To be determined   Skilled Therapeutic Intervention 1:1 Ot eval initiated with OT goals, purpose and role discussed. Pt received in w/c.  Self care retraining at shower level. Pt perform stand pivot transfer on/ off toilet and in and out of shower stall with mod A. Pt required max cues to achieve fully erect standing posture. Pt also requires extra time to demonstrate purposeful movement in left LE/ UE. Pt able to grasp and release with left UE but difficulty with raising above about 50 degrees against gravity; however pt does try to incorporate it most functional tasks. Pt with no clothing during eval so donned paper clothing with mod A for shirt and max A for pants. Pt did don shoes with extra time with min A (no fasteners).   2nd session 13:00-14:00 (45mn)  1:1 Pt sitting EOB with nursing when arrived. Continued to practice sit to stands pushing up from bed with right hand. Pt was able to progress from mod A to contact guard from this morning's eval to this session and throughout session. PT transfered into the w/c with min A with extra time with instructional cues for sequencing. On EOM continued to practice sit to sands and standing balance without UE support with mirror for visual feed back. Pt able to demonstrate postural control to adjust body for improved standing balance (contact guard).   Transitioned into supine with mod A due to left flank pain. Relief with supine position. Focus on NMR of left UE with AAROM in all planes/ PNF patters with A against gravity and to reach end ranges. Also perform functional reaching task with grasp and release of large pegs with min A  against gravity and extra time.   Pt requested to ambulate again. In the hallway with rail on  right side pt able to ambulate 30 feet with contact guard/ min A without requiring facilitation for weight shift nor follow through with left LE. Cues provided for larger step on right.   OT Evaluation Precautions/Restrictions  Precautions Precautions: Fall Precaution Comments: L hemiparesis Restrictions Weight Bearing Restrictions: No General Chart Reviewed: Yes Family/Caregiver Present: No  Pain Pain Assessment Pain Scale: 0-10 Pain Score: 0-No pain Home Living/Prior Functioning Home Living Living Arrangements: Alone Available Help at Discharge: Family, Available 24 hours/day Type of Home: House Home Access: Stairs to enter [employer] of Steps: 1 onto patio then 1 into [employer]: None Home Layout: One level Bathroom Shower/Tub: Multimedia programmer: Handicapped height Bathroom Accessibility: Yes Additional Comments: reports she is going to her daughters house  Lives With: Alone Prior Function Level of Independence: Independent with basic ADLs, Independent with gait, Independent with homemaking with ambulation  Able to Take Stairs?: Yes Driving: Yes Vocation: Retired Comments: Patient drives in community as needed, has RW and cane that she used only after hip replacement ADL ADL Grooming: Setup, Minimal assistance Where Assessed-Grooming: Sitting at sink Upper Body Bathing: Minimal assistance Where Assessed-Upper Body Bathing: Shower Lower Body Bathing: Moderate assistance Where Assessed-Lower Body Bathing: Shower Upper Body Dressing: Moderate assistance Where Assessed-Upper Body Dressing: Sitting at sink, Wheelchair Lower Body Dressing: Maximal assistance Where Assessed-Lower Body Dressing: Sitting at sink, Wheelchair Toileting: Maximal assistance Where Assessed-Toileting: Glass blower/designer: Moderate assistance Toilet Transfer Method: Stand pivot Toilet Transfer Equipment: Energy manager: Moderate  assistance Social research officer, government Method: Heritage manager: Grab bars, Transfer tub bench [facility] Perception   slight inattention left side but able to correct with subtle cues Praxis  motor planning deficits present.  Cognition Overall Cognitive Status: Within Functional Limits for tasks assessed Arousal/Alertness: Awake/alert Orientation Level: Person;Place;Situation Person: Oriented Place: Oriented Situation: Oriented Year: 2020 Month: November Day of Week: Correct Memory: Appears intact Immediate Memory Recall: Sock;Blue;Bed Memory Recall Sock: Not able to recall Memory Recall Blue: Without Cue Memory Recall Bed: Without Cue Attention: Sustained Sustained Attention: Appears intact Awareness: Appears intact Safety/Judgment: Appears intact Sensation Sensation Light Touch: Appears Intact Proprioception: Appears Intact Additional Comments: sensation grossly intact B UEs/LEs Coordination Gross Motor Movements are Fluid and Coordinated: No Fine Motor Movements are Fluid and Coordinated: No Coordination and Movement Description: coordination impaired secondary to L sided weakness/hemiparesis Finger Nose Finger Test: unable to do successfully on the left; difficulty isolating index finger. Motor  Motor Motor: Hemiplegia;Abnormal postural alignment and control;Abnormal tone Motor - Skilled Clinical Observations: L hemiparesis Mobility  Transfers Sit to Stand: Moderate Assistance - Patient 50-74%  Trunk/Postural Assessment  Cervical Assessment Cervical Assessment: Exceptions to WFL(flexed head position) Thoracic Assessment Thoracic Assessment: (rib pain from fall) Lumbar Assessment Lumbar Assessment: Exceptions to [facility] Postural Control Postural Control: Deficits on evaluation Protective Responses: delayed Postural Limitations: delayed  Balance Balance Balance Assessed: Yes Static Sitting Balance Static Sitting - Level of Assistance: 5:  Stand by assistance Dynamic Sitting Balance Dynamic Sitting - Level of Assistance: 5: Stand by assistance;4: Min assist Static Standing Balance Static Standing - Level of Assistance: 4: Min assist Dynamic Standing Balance Dynamic Standing - Level of Assistance: 3: Mod assist;2: Max assist Extremity/Trunk Assessment RUE Assessment RUE Assessment: Within Functional Limits General Strength Comments: old rotator cuff injury 0-100 range 4/5 strength LUE Assessment LUE Assessment: Exceptions to  WFL LUE Body System: Neuro Brunstrum levels for arm and hand: Arm;Hand Brunstrum level for arm: Stage IV Movement is deviating from synergy Brunstrum level for hand: Stage IV Movements deviating from synergies LUE Tone LUE Tone: Within Functional Limits     Refer to Care Plan for Long Term Goals  Recommendations for other services: None    Discharge Criteria: Patient will be discharged from OT if patient refuses treatment 3 consecutive times without medical reason, if treatment goals not met, if there is a change in medical status, if patient makes no progress towards goals or if patient is discharged from hospital.  The above assessment, treatment plan, treatment alternatives and goals were discussed and mutually agreed upon: by patient  Nicoletta Ba 08/02/2019, 12:31 PM

## 2019-08-02 NOTE — Progress Notes (Signed)
[place] PHYSICAL MEDICINE & REHABILITATION PROGRESS NOTE  Subjective/Complaints: Patient seen laying in bed this morning.  She states she slept well overnight.  She is ready to begin therapies today.  Discussed with SLP will hold off on evaluation at this time.  ROS: Denies CP, SOB, N/V/D  Objective: Vital Signs: Blood pressure 128/83, pulse 77, temperature (!) 97.5 F (36.4 C), temperature source Oral, resp. rate 18, height 5' (1.524 m), weight 63.2 kg, SpO2 93 %. No results found. Recent Labs    07/30/19 1802 07/30/19 1827 08/01/19 0536  WBC 38.4*  --  33.0*  HGB 14.6 15.3* 12.9  HCT 44.8 45.0 39.7  PLT 111*  --  93*   Recent Labs    07/30/19 1827 08/01/19 0536  NA 140 138  K 4.4 3.6  CL 104 103  CO2  --  24  GLUCOSE 101* 86  BUN 9 7*  CREATININE 0.50 0.51  CALCIUM  --  8.4*    Physical Exam: BP 128/83 (BP Location: Right Arm)   Pulse 77   Temp (!) 97.5 F (36.4 C) (Oral)   Resp 18   Ht 5' (1.524 m)   Wt 63.2 kg   SpO2 93%   BMI 27.21 kg/m  Constitutional: No distress . Vital signs reviewed. HENT: Bruises on face with edema Eyes: EOMI. No discharge. Cardiovascular: No JVD. Respiratory: Normal effort.  No stridor. GI: Non-distended. Skin: Warm and dry.  Intact. Psych: Flat.  Slowed. Musc: No edema in extremities.  No tenderness in extremities. Neurological: Alert Follows simple commands.   HOH Motor: Right upper extremity/right lower extremity: 5/5 proximal distal Left upper extremity: Shoulder abduction, elbow flexion/extension 3+/5, handgrip 3+/5 with apraxia Left lower extremity: 4-/5 proximal distal  Assessment/Plan: 1. Functional deficits secondary to right pontine infarct which require 3+ hours per day of interdisciplinary therapy in a comprehensive inpatient rehab setting.  Physiatrist is providing close team supervision and 24 hour management of active medical problems listed below.  Physiatrist and rehab team continue to assess  barriers to discharge/monitor patient progress toward functional and medical goals  Care Tool:  Bathing              Bathing assist       Upper Body Dressing/Undressing Upper body dressing        Upper body assist      Lower Body Dressing/Undressing Lower body dressing            Lower body assist       Toileting Toileting    Toileting assist Assist for toileting: Moderate Assistance - Patient 50 - 74%     Transfers Chair/bed transfer  Transfers assist     Chair/bed transfer assist level: Moderate Assistance - Patient 50 - 74%     Locomotion Ambulation   Ambulation assist      Assist level: Maximal Assistance - Patient 25 - 49% Assistive device: No Device Max distance: 10 ft   Walk 10 feet activity   Assist     Assist level: Maximal Assistance - Patient 25 - 49% Assistive device: No Device   Walk 50 feet activity   Assist Walk 50 feet with 2 turns activity did not occur: Safety/medical concerns         Walk 150 feet activity   Assist Walk 150 feet activity did not occur: Safety/medical concerns         Walk 10 feet on uneven surface  activity   Assist Walk 10 feet on  uneven surfaces activity did not occur: Safety/medical concerns         Wheelchair     Assist Will patient use wheelchair at discharge?: No(tbd)             Wheelchair 50 feet with 2 turns activity    Assist            Wheelchair 150 feet activity     Assist            Medical Problem List and Plan: 1.  Left-sided weakness secondary to small right pontine infarction secondary small vessel disease on 07/30/2019.  Begin CIR evaluations 2.  Antithrombotics: -DVT/anticoagulation: Lovenox             -antiplatelet therapy: Aspirin 81 mg daily and Plavix 75 mg daily x3 weeks then aspirin alone 3. Pain Management: Tylenol as needed 4. Mood: Xanax 0.25 mg daily                         Team to provide emotional support              -antipsychotic agents: N/A 5. Neuropsych: This patient is capable of making decisions on her own behalf. 6. Skin/Wound Care: Routine skin checks 7. Fluids/Electrolytes/Nutrition: Routine in and outs.    CMP ordered for Monday 8.  History of CLL.  Followed outpatient by Dr. Marko Plume.    CBC ordered for Monday 9.  Permissive hypertension.  Patient on Lotrel 5-20 mg daily as needed prior to admission.             Monitor with increased mobility 10.  Thrombocytopenia             Platelets 93 on 11/13, labs ordered for Monday  LOS: 1 days A FACE TO FACE EVALUATION WAS PERFORMED  Deidra Spease Lorie Phenix 08/02/2019, 12:39 PM

## 2019-08-03 ENCOUNTER — Inpatient Hospital Stay (HOSPITAL_COMMUNITY): Payer: PPO

## 2019-08-03 DIAGNOSIS — R0989 Other specified symptoms and signs involving the circulatory and respiratory systems: Secondary | ICD-10-CM

## 2019-08-03 DIAGNOSIS — I639 Cerebral infarction, unspecified: Secondary | ICD-10-CM

## 2019-08-03 DIAGNOSIS — M62838 Other muscle spasm: Secondary | ICD-10-CM

## 2019-08-03 MED ORDER — BACLOFEN 5 MG HALF TABLET
5.0000 mg | ORAL_TABLET | Freq: Every day | ORAL | Status: DC
  Administered 2019-08-03: 5 mg via ORAL
  Filled 2019-08-03: qty 1

## 2019-08-03 NOTE — Progress Notes (Addendum)
[place] PHYSICAL MEDICINE & REHABILITATION PROGRESS NOTE  Subjective/Complaints: Patient seen laying in bed this morning.  She states she did not sleep well overnight due to left leg spasms.  She states had good first day of therapies yesterday.  ROS: Denies CP, SOB, N/V/D  Objective: Vital Signs: Blood pressure (!) 163/78, pulse 67, temperature 97.6 F (36.4 C), resp. rate 18, height 5' (1.524 m), weight 63.2 kg, SpO2 97 %. No results found. Recent Labs    08/01/19 0536  WBC 33.0*  HGB 12.9  HCT 39.7  PLT 93*   Recent Labs    08/01/19 0536  NA 138  K 3.6  CL 103  CO2 24  GLUCOSE 86  BUN 7*  CREATININE 0.51  CALCIUM 8.4*    Physical Exam: BP (!) 163/78 (BP Location: Right Arm)   Pulse 67   Temp 97.6 F (36.4 C)   Resp 18   Ht 5' (1.524 m)   Wt 63.2 kg   SpO2 97%   BMI 27.21 kg/m  Constitutional: No distress . Vital signs reviewed. HENT: Facial bruising with edema Eyes: EOMI. No discharge. Cardiovascular: No JVD. Respiratory: Normal effort.  No stridor. GI: Non-distended. Skin: Warm and dry.  Intact. Psych: Flat.  Slowed. Musc: No edema in extremities.  No tenderness in extremities. Neurological: Alert Follows simple commands.   HOH Motor: Right upper extremity/right lower extremity: 5/5 proximal distal Left upper extremity: Shoulder abduction, elbow flexion/extension 3+/5, handgrip 3+/5 with apraxia, unchanged Left lower extremity: 4-/5 proximal distal, unchanged No increase in tone appreciated  Assessment/Plan: 1. Functional deficits secondary to right pontine infarct which require 3+ hours per day of interdisciplinary therapy in a comprehensive inpatient rehab setting.  Physiatrist is providing close team supervision and 24 hour management of active medical problems listed below.  Physiatrist and rehab team continue to assess barriers to discharge/monitor patient progress toward functional and medical goals  Care Tool:  Bathing    Body  parts bathed by patient: Left arm, Chest, Abdomen, Right upper leg, Left upper leg, Face, Front perineal area   Body parts bathed by helper: Buttocks, Right arm, Right lower leg, Left lower leg     Bathing assist Assist Level: Moderate Assistance - Patient 50 - 74%     Upper Body Dressing/Undressing Upper body dressing   What is the patient wearing?: Pull over shirt    Upper body assist Assist Level: Moderate Assistance - Patient 50 - 74%    Lower Body Dressing/Undressing Lower body dressing      What is the patient wearing?: Incontinence brief, Pants     Lower body assist Assist for lower body dressing: Total Assistance - Patient < 25%     Toileting Toileting    Toileting assist Assist for toileting: Maximal Assistance - Patient 25 - 49%     Transfers Chair/bed transfer  Transfers assist     Chair/bed transfer assist level: Maximal Assistance - Patient 25 - 49%     Locomotion Ambulation   Ambulation assist      Assist level: Maximal Assistance - Patient 25 - 49% Assistive device: No Device Max distance: 10 ft   Walk 10 feet activity   Assist     Assist level: Maximal Assistance - Patient 25 - 49% Assistive device: No Device   Walk 50 feet activity   Assist Walk 50 feet with 2 turns activity did not occur: Safety/medical concerns         Walk 150 feet activity  Assist Walk 150 feet activity did not occur: Safety/medical concerns         Walk 10 feet on uneven surface  activity   Assist Walk 10 feet on uneven surfaces activity did not occur: Safety/medical concerns         Wheelchair     Assist Will patient use wheelchair at discharge?: No(tbd)             Wheelchair 50 feet with 2 turns activity    Assist            Wheelchair 150 feet activity     Assist            Medical Problem List and Plan: 1.  Left-sided weakness secondary to small right pontine infarction secondary small vessel  disease on 07/30/2019.  Continue CIR 2.  Antithrombotics: -DVT/anticoagulation: Lovenox             -antiplatelet therapy: Aspirin 81 mg daily and Plavix 75 mg daily x3 weeks then aspirin alone 3. Pain Management: Tylenol as needed  Baclofen 5 nightly started on 11/15 for nightly leg spasms 4. Mood: Xanax 0.25 mg daily                         Team to provide emotional support             -antipsychotic agents: N/A 5. Neuropsych: This patient is capable of making decisions on her own behalf. 6. Skin/Wound Care: Routine skin checks 7. Fluids/Electrolytes/Nutrition: Routine in and outs.    CMP ordered for tomorrow 8.  History of CLL.  Followed outpatient by Dr. Marko Plume.    Afebrile  WBCs 33.0 on 11/13  CBC ordered for tomorrow 9.  Hypertension.  Patient on Lotrel 5-20 mg daily as needed prior to admission.  Labile on 11/15, monitor for trend and gradually normalize             Monitor with increased mobility 10.  Thrombocytopenia             Platelets 93 on 11/13, labs ordered for tomorrow  LOS: 2 days A FACE TO FACE EVALUATION WAS PERFORMED   Lorie Phenix 08/03/2019, 12:28 PM

## 2019-08-03 NOTE — Plan of Care (Signed)
  Problem: Consults Goal: RH STROKE PATIENT EDUCATION Description: See Patient Education module for education specifics  Outcome: Progressing Goal: Nutrition Consult-if indicated Outcome: Progressing   Problem: RH BOWEL ELIMINATION Goal: RH STG MANAGE BOWEL WITH ASSISTANCE Description: STG Manage Bowel with min Assistance. Outcome: Progressing Goal: RH STG MANAGE BOWEL W/MEDICATION W/ASSISTANCE Description: STG Manage Bowel with Medication with min Assistance. Outcome: Progressing   Problem: RH BLADDER ELIMINATION Goal: RH STG MANAGE BLADDER WITH ASSISTANCE Description: STG Manage Bladder With min Assistance Outcome: Progressing   Problem: RH SKIN INTEGRITY Goal: RH STG SKIN FREE OF INFECTION/BREAKDOWN Outcome: Progressing Goal: RH STG MAINTAIN SKIN INTEGRITY WITH ASSISTANCE Description: STG Maintain Skin Integrity With min Assistance. Outcome: Progressing Goal: RH STG ABLE TO PERFORM INCISION/WOUND CARE W/ASSISTANCE Description: STG Able To Perform Incision/Wound Care With min Assistance. Outcome: Progressing   Problem: RH SAFETY Goal: RH STG ADHERE TO SAFETY PRECAUTIONS W/ASSISTANCE/DEVICE Description: STG Adhere to Safety Precautions With min Assistance/Device. Outcome: Progressing   Problem: RH COGNITION-NURSING Goal: RH STG USES MEMORY AIDS/STRATEGIES W/ASSIST TO PROBLEM SOLVE Description: STG Uses Memory Aids/Strategies With min Assistance to Problem Solve. Outcome: Progressing Goal: RH STG ANTICIPATES NEEDS/CALLS FOR ASSIST W/ASSIST/CUES Description: STG Anticipates Needs/Calls for Assist With min Assistance/Cues. Outcome: Progressing   Problem: RH PAIN MANAGEMENT Goal: RH STG PAIN MANAGED AT OR BELOW PT'S PAIN GOAL Description: Pain less than 2. Outcome: Progressing   Problem: RH KNOWLEDGE DEFICIT Goal: RH STG INCREASE KNOWLEDGE OF STROKE PROPHYLAXIS Outcome: Progressing

## 2019-08-03 NOTE — Progress Notes (Signed)
Physical Therapy Session Note  Patient Details  Name: Abigail Moreno MRN: [id number] Date of Birth: 17-Dec-1928  Today's Date: 08/03/2019 PT Individual Time: 1105-1205 PT Individual Time Calculation (min): 60 min   Short Term Goals: Week 1:  PT Short Term Goal 1 (Week 1): Pt will perform bed<>chair transfer with min assist PT Short Term Goal 2 (Week 1): Pt will ambulate x 50 ft with LRAD and min assist PT Short Term Goal 3 (Week 1): Pt will initiate stair training  Skilled Therapeutic Interventions/Progress Updates:  Pt resting in bed.  No c/o pain.pt noted to be wheezing during session; [name], RN checked pt's meds, which do not include meds for this.  She will notify MD.   neuromuscular re-education via multimodal cues and demo for supine: L heel slides, L hip circles with flexed knee, R /L ankle pumps, bil bridging, cervical flexion, bil quad sets. Sitting EOM: bil scapular retraction, bil shoulder flexion with trunk flexion on hands on seat of chair in front of her  Bed mobility training for rolling L><R, iwht min assist fading to supervision with mod cues.  R side lying > sitting with min assist, painful due to rib trauma.   Sitting balance activities : L lateral leans with good activation of L triceps noted; R lateral leans too painful. .  Reciprocal scooting forward/backward with wt shifting to optimize L pelvic activation; pt fear of falling limits her success with this at this time.  Sit> stand from slightly raised bed, mod assist and mod cues for set-up and forward wt shift, x 2.  Static > dynamic standing with bil UE support on PT for R><L wt shifting.  Stand pivot to wc to her R, mod/max assist for LLE progression, and movement of hips to the R.  At end of session, pt seated in w/c with pillow behind her back and bil footrests raised to accommodate pt's small stature and decrease posterior pelvic tilt. Seat belt alarm set.     Therapy Documentation Precautions:   Precautions Precautions: Fall Precaution Comments: L hemiparesis Restrictions Weight Bearing Restrictions: No Pain: Pain Assessment Pain Scale: 0-10 Pain Score: 0-No pain       Therapy/Group: Individual Therapy  [name] 08/03/2019, 12:13 PM

## 2019-08-04 ENCOUNTER — Inpatient Hospital Stay (HOSPITAL_COMMUNITY): Payer: PPO | Admitting: Occupational Therapy

## 2019-08-04 ENCOUNTER — Inpatient Hospital Stay (HOSPITAL_COMMUNITY): Payer: PPO | Admitting: Physical Therapy

## 2019-08-04 LAB — CBC WITH DIFFERENTIAL/PLATELET
Abs Immature Granulocytes: 0 10*3/uL (ref 0.00–0.07)
Basophils Absolute: 0.7 10*3/uL — ABNORMAL HIGH (ref 0.0–0.1)
Basophils Relative: 2 %
Eosinophils Absolute: 0 10*3/uL (ref 0.0–0.5)
Eosinophils Relative: 0 %
HCT: 41.2 % (ref 36.0–46.0)
Hemoglobin: 13.5 g/dL (ref 12.0–15.0)
Lymphocytes Relative: 62 %
Lymphs Abs: 21.8 10*3/uL — ABNORMAL HIGH (ref 0.7–4.0)
MCH: 30.5 pg (ref 26.0–34.0)
MCHC: 32.8 g/dL (ref 30.0–36.0)
MCV: 93 fL (ref 80.0–100.0)
Monocytes Absolute: 1.8 10*3/uL — ABNORMAL HIGH (ref 0.1–1.0)
Monocytes Relative: 5 %
Neutro Abs: 10.9 10*3/uL — ABNORMAL HIGH (ref 1.7–7.7)
Neutrophils Relative %: 31 %
Platelets: 129 10*3/uL — ABNORMAL LOW (ref 150–400)
RBC: 4.43 MIL/uL (ref 3.87–5.11)
RDW: 13.9 % (ref 11.5–15.5)
WBC: 35.2 10*3/uL — ABNORMAL HIGH (ref 4.0–10.5)
nRBC: 0 % (ref 0.0–0.2)
nRBC: 0 /100 WBC

## 2019-08-04 LAB — COMPREHENSIVE METABOLIC PANEL
ALT: 17 U/L (ref 0–44)
AST: 33 U/L (ref 15–41)
Albumin: 3 g/dL — ABNORMAL LOW (ref 3.5–5.0)
Alkaline Phosphatase: 86 U/L (ref 38–126)
Anion gap: 12 (ref 5–15)
BUN: 10 mg/dL (ref 8–23)
CO2: 22 mmol/L (ref 22–32)
Calcium: 8.6 mg/dL — ABNORMAL LOW (ref 8.9–10.3)
Chloride: 105 mmol/L (ref 98–111)
Creatinine, Ser: 0.53 mg/dL (ref 0.44–1.00)
GFR calc Af Amer: >60 mL/min (ref >60)
GFR calc non Af Amer: >60 mL/min (ref >60)
Glucose, Bld: 89 mg/dL (ref 70–99)
Potassium: 3.7 mmol/L (ref 3.5–5.1)
Sodium: 139 mmol/L (ref 135–145)
Total Bilirubin: 0.9 mg/dL (ref 0.3–1.2)
Total Protein: 5.5 g/dL — ABNORMAL LOW (ref 6.5–8.1)

## 2019-08-04 MED ORDER — BACLOFEN 10 MG PO TABS
10.0000 mg | ORAL_TABLET | Freq: Every day | ORAL | Status: DC
  Administered 2019-08-04: 10 mg via ORAL
  Filled 2019-08-04: qty 1

## 2019-08-04 NOTE — Progress Notes (Signed)
Social Work Assessment and Plan   Patient Details  Name: Abigail Moreno MRN: [id number] Date of Birth: 05-Oct-1928  Today's Date: 08/04/2019  Problem List:  Patient Active Problem List   Diagnosis Date Noted  . Labile blood pressure   . Muscle spasm of left lower extremity   . Personal history of CLL (chronic lymphocytic leukemia)   . Pressure injury of skin 08/01/2019  . Right pontine CVA ([place]) 08/01/2019  . Acute CVA (cerebrovascular accident) ([place]) 07/30/2019  . Essential hypertension 07/30/2019  . Overweight (BMI 25.0-29.9) 09/18/2018  . S/P left THA, AA 09/17/2018  . Environmental allergies 07/19/2016  . History of left breast cancer 10/23/2015  . Status post hysterectomy 06/15/2015  . Unresolved grief 06/15/2015  . Thrombocytopenia ([place]) 06/15/2015  . Need for prophylactic vaccination and inoculation against influenza 06/15/2015  . Mixed hyperlipidemia 03/16/2015  . Osteopenia 03/16/2015  . Osteoarthritis 03/16/2015  . Vaginal atrophy 03/16/2015  . Papanicolaou smear for cervical cancer screening 03/16/2015  . Macular degeneration 01/07/2015  . CLL (chronic lymphocytic leukemia) ([place]) 11/18/2011  . Breast cancer ([place]) 11/18/2011   Past Medical History:  Past Medical History:  Diagnosis Date  . Anxiety   . Arthritis of knee  FALL 2012   RIGHT KNEE, hips, and fingers  . Breast cancer ([place]) FEBRUARY 2004   T1, NO, ER/PR POSITIVE LEFT BREAST  . CLL (chronic lymphocytic leukemia) ([place]) 2004   Dr. Marko Plume is oncologist  . Dyspnea    occ sob with exertion  . History of blood transfusion    with first and maybe second delivery  . Hyperlipemia   . Hypertension   . Macular degeneration    both eyes  . Osteoporosis    borderline  . UTI (urinary tract infection)    on nitrofuratonin bid x 7 days started 09-12-18 am   Past Surgical History:  Past Surgical History:  Procedure Laterality Date  . ABDOMINAL HYSTERECTOMY  1972   UNILATERAL OOPHORECTOMY( LEFT)  .  APPENDECTOMY    . EYE SURGERY Bilateral    cataracts  . INCONTINENCE SURGERY    . MASTECTOMY PARTIAL / LUMPECTOMY W/ AXILLARY LYMPHADENECTOMY  FEBRUARY 2004   LEFT BREAST  . OTHER SURGICAL HISTORY     cysts removed from breasts   . TOTAL HIP ARTHROPLASTY  12/26/2011   Procedure: TOTAL HIP ARTHROPLASTY ANTERIOR APPROACH;  Surgeon: Mauri Pole, MD;  Location: [facility];  Service: Orthopedics;  Laterality: Right;  . TOTAL HIP ARTHROPLASTY Left 09/17/2018   Procedure: TOTAL HIP ARTHROPLASTY ANTERIOR APPROACH;  Surgeon: Paralee Cancel, MD;  Location: [facility];  Service: Orthopedics;  Laterality: Left;  70 mins   Social History:  reports that she quit smoking about 60 years ago. Her smoking use included cigarettes. She has a 0.50 pack-year smoking history. She has never used smokeless tobacco. She reports that she does not drink alcohol or use drugs.  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 Pt has friends and neighbors who will visit and check on her also.  Social History Preferred language: English Religion: Baptist Cultural Background: No issues Education: HS Read: Yes Write: Yes Employment Status: Retired Public relations account executive  Issues: No issues Guardian/Conservator: None-according to MD pt is capable of making her own decisions while here. Will make sure her daughter is included also since pt wants this   Abuse/Neglect Abuse/Neglect Assessment Can Be Completed: Yes Physical Abuse: Denies Verbal Abuse: Denies Sexual Abuse: Denies Exploitation of  patient/patient's resources: Denies Self-Neglect: Denies  Emotional Status Pt's affect, behavior and adjustment status: Pt is motivated to do well and recover from this stroke, she is pleased it was not on her dominat side and she has the ability to use her right hand still. She is hopeful she can get back to her independent level. Her children are involved and will assist if needed. Recent Psychosocial Issues: other health issues Psychiatric History: History of anxiety takes medications which she finds helpful. Seems to be adjusting to being on the rehab unit and is motivated to do well here. Will continue to monitor and see if needs neuro-psych services while here. Substance Abuse History: No issues  Patient / Family Perceptions, Expectations & Goals Pt/Family understanding of illness & functional limitations: Pt and daughter can explain her stroke and deficits. Both have spoken with the MD and feel they have a good understanding of her treatment plan going forward and hopeful for her progress while here Premorbid pt/family roles/activities: Mom, grandmother, retiree, nieghbor, church member, etc Anticipated changes in roles/activities/participation: resume Pt/family expectations/goals: Pt states: " I want to be able to do for myself and not bother my daughter she has enough to do."  Daughter states: " I hope she will be able to move aorund on her own and need me to be there if needed."  [employer]: Other (Comment)(Followed by [facility]) Premorbid Home Care/DME Agencies: Other (Comment)(after THR has rw, bsc) Transportation available at discharge: pt drove but will rely upon children now  Discharge Planning Living Arrangements: Alone Support Systems: Children, Friends/neighbors, Church/faith community Type of Residence: Private residence Insurance Resources: Multimedia programmer (specify)(Health Team Advantage) Financial Resources: [place] Referred: No Living Expenses: Own Money Management: Patient Does the patient have any problems obtaining your medications?: No Home Management: Self Patient/Family Preliminary Plans: Plans to go to daughter's home at discharge and then hopefully back to her home after she is safe to be alone and is mod/i level. Daughter works from home so can be there but is limited due to her own health issues. Will work on discharge needs and provide support to pt. Social Work Anticipated Follow Up Needs: HH/OP  Clinical Impression Pleasant motivated female who has always been able  to be independent and live alone. Her two children are involved and abel to assist. Her plan is to go to her daughter's home at discharge since she is able to provide supervision, she is currently working from home. Will work on discharge needs. Pt appears to be coping appropriately and adjusting well here.  Elease Hashimoto 08/04/2019, 9:19 AM

## 2019-08-04 NOTE — Plan of Care (Signed)
  Problem: Consults Goal: RH STROKE PATIENT EDUCATION Description: See Patient Education module for education specifics  Outcome: Progressing Goal: Nutrition Consult-if indicated Outcome: Progressing   Problem: RH BOWEL ELIMINATION Goal: RH STG MANAGE BOWEL WITH ASSISTANCE Description: STG Manage Bowel with min Assistance. Outcome: Progressing Goal: RH STG MANAGE BOWEL W/MEDICATION W/ASSISTANCE Description: STG Manage Bowel with Medication with min Assistance. Outcome: Progressing   Problem: RH BLADDER ELIMINATION Goal: RH STG MANAGE BLADDER WITH ASSISTANCE Description: STG Manage Bladder With min Assistance Outcome: Progressing   Problem: RH SKIN INTEGRITY Goal: RH STG SKIN FREE OF INFECTION/BREAKDOWN Outcome: Progressing Goal: RH STG MAINTAIN SKIN INTEGRITY WITH ASSISTANCE Description: STG Maintain Skin Integrity With min Assistance. Outcome: Progressing Goal: RH STG ABLE TO PERFORM INCISION/WOUND CARE W/ASSISTANCE Description: STG Able To Perform Incision/Wound Care With min Assistance. Outcome: Progressing   Problem: RH SAFETY Goal: RH STG ADHERE TO SAFETY PRECAUTIONS W/ASSISTANCE/DEVICE Description: STG Adhere to Safety Precautions With min Assistance/Device. Outcome: Progressing   Problem: RH COGNITION-NURSING Goal: RH STG USES MEMORY AIDS/STRATEGIES W/ASSIST TO PROBLEM SOLVE Description: STG Uses Memory Aids/Strategies With min Assistance to Problem Solve. Outcome: Progressing Goal: RH STG ANTICIPATES NEEDS/CALLS FOR ASSIST W/ASSIST/CUES Description: STG Anticipates Needs/Calls for Assist With min Assistance/Cues. Outcome: Progressing   Problem: RH PAIN MANAGEMENT Goal: RH STG PAIN MANAGED AT OR BELOW PT'S PAIN GOAL Description: Pain less than 2. Outcome: Progressing   Problem: RH KNOWLEDGE DEFICIT Goal: RH STG INCREASE KNOWLEDGE OF STROKE PROPHYLAXIS Outcome: Progressing

## 2019-08-04 NOTE — Progress Notes (Signed)
Occupational Therapy Session Note  Patient Details  Name: TALYNN LEBON MRN: [id number] Date of Birth: 11/11/1928  Today's Date: 08/04/2019 OT Individual Time: 1045-1200 OT Individual Time Calculation (min): 75 min (1200 - 1245   Unattended estim)   Short Term Goals: Week 1:  OT Short Term Goal 1 (Week 1): Pt will don shirt with contact guard without chair support OT Short Term Goal 2 (Week 1): Pt will perform sit to stand with min A consistently for tranfsers and clothing management OT Short Term Goal 3 (Week 1): Pt will performed toilet transfers with min A with LRAD OT Short Term Goal 4 (Week 1): Pt will perform one grooming tasks in standing with contact guard.  Skilled Therapeutic Interventions/Progress Updates:    Pt received in w/c agreeable to a shower.  See ADL documentation below for details. Focused on sit to stand with placement of LLE for doffing/donning pants over hips and standing to wash bottom.  Pt was able to balance with min A so she could use her R hand to actively A with those task. Pt able to tolerate standing for a few minutes at a time.    Pt only has 10 degrees of sh flexion AROM with minimal scapular elevation/ retraction. She is developing elbow AROM and can squeeze her hand. Pt agreeable to trying some estim to shoulder to activate deltoids.  Saebo Stim Go applied and for 15 min pt worked on [employer] with pushing pulling exercises using towel slides on bedside table.  She could fully push arm forward on table during 8 sec activation and then pull back on relax phase.     Pt resting in wc with belt alarm on and all needs met.    Estim left on unattended for cyclic estim for 45 minutes.     Saebo Stim Go 330 pulse width 35 Hz pulse rate On 8 sec/ off 8 sec Ramp up/ down 2 sec Symmetrical Biphasic wave form  Max intensity 171m at 500 Ohm load  Pt tolerated estim well, no adverse effects.     Therapy Documentation Precautions:   Precautions Precautions: Fall Precaution Comments: L hemiparesis Restrictions Weight Bearing Restrictions: No    Pain: Pain Assessment Pain Scale: 0-10 Pain Score: 2  - R ribs ADL: ADL Grooming: Setup, Minimal assistance Where Assessed-Grooming: Sitting at sink Upper Body Bathing: Minimal assistance Where Assessed-Upper Body Bathing: Shower Lower Body Bathing: Minimal assistance Where Assessed-Lower Body Bathing: Shower Upper Body Dressing: Moderate assistance Where Assessed-Upper Body Dressing: Sitting at sink, Wheelchair Lower Body Dressing: Maximal assistance Where Assessed-Lower Body Dressing: Sitting at sink, WPublic relations account executive MEnvironmental education officerMethod: SEducation officer, environmental Grab bars, Transfer tub bench   Therapy/Group: Individual Therapy  SDuncan11/16/2020, 12:31 PM

## 2019-08-04 NOTE — Progress Notes (Signed)
Inpatient Rehabilitation  Patient information reviewed and entered into eRehab system by Jester Klingberg M. Kynlea Blackston, M.A., CCC/SLP, PPS Coordinator.  Information including medical coding, functional ability and quality indicators will be reviewed and updated through discharge.    

## 2019-08-04 NOTE — Progress Notes (Signed)
[facility] PHYSICAL MEDICINE & REHABILITATION PROGRESS NOTE  Subjective/Complaints: Patient seen sitting up in [place] this morning, reading the obituary for her husband on her ipad. She tells me about how he served in the air [employer], and about their marriage and children. She continues to have pain in her left lower abdomen where she has a bruise, she would like some Tylenol for the pain. She continues to have leg cramps at night and had difficulty sleeping, last night due to the cramps and some nights because she has a hard time finding a comfortable position in her bed.  WBC 35.2 this morning, up from 33 on 11/13  ROS: Denies CP, SOB, N/V/D  Objective: Vital Signs: Blood pressure (!) 148/81, pulse 64, temperature (!) 97.5 F (36.4 C), temperature source Oral, resp. rate 20, height 5' (1.524 m), weight 63.2 kg, SpO2 92 %. No results found. Recent Labs    08/04/19 0531  WBC 35.2*  HGB 13.5  HCT 41.2  PLT 129*   Recent Labs    08/04/19 0531  NA 139  K 3.7  CL 105  CO2 22  GLUCOSE 89  BUN 10  CREATININE 0.53  CALCIUM 8.6*    Physical Exam: BP (!) 148/81 (BP Location: Right Arm)   Pulse 64   Temp (!) 97.5 F (36.4 C) (Oral)   Resp 20   Ht 5' (1.524 m)   Wt 63.2 kg   SpO2 92%   BMI 27.21 kg/m  Constitutional: No distress . Vital signs reviewed. Sitting up in [place] HENT: Facial bruising with edema Eyes: EOMI. No discharge. Cardiovascular: No JVD. Respiratory: Normal effort.  No stridor. GI: Non-distended. Skin: Warm and dry.  Intact. Psych: Slowed. Cooperative, engaged and pleasant.  Musc: No edema in extremities.  No tenderness in extremities. Neurological: Alert Follows simple commands.   HOH Motor: Right upper extremity/right lower extremity: 5/5 proximal distal Left upper extremity: Shoulder abduction, elbow flexion/extension 3+/5, handgrip 3+/5 with apraxia, unchanged Left lower extremity: 4-/5 proximal distal, unchanged No increase in tone  appreciated  Assessment/Plan: 1. Functional deficits secondary to right pontine infarct which require 3+ hours per day of interdisciplinary therapy in a comprehensive inpatient rehab setting.  Physiatrist is providing close team supervision and 24 hour management of active medical problems listed below.  Physiatrist and rehab team continue to assess barriers to discharge/monitor patient progress toward functional and medical goals  Care Tool:  Bathing    Body parts bathed by patient: Left arm, Chest, Abdomen, Right upper leg, Left upper leg, Face, Front perineal area   Body parts bathed by helper: Buttocks, Right arm, Right lower leg, Left lower leg     Bathing assist Assist Level: Moderate Assistance - Patient 50 - 74%     Upper Body Dressing/Undressing Upper body dressing   What is the patient wearing?: Pull over shirt    Upper body assist Assist Level: Moderate Assistance - Patient 50 - 74%    Lower Body Dressing/Undressing Lower body dressing      What is the patient wearing?: Incontinence brief, Pants     Lower body assist Assist for lower body dressing: Total Assistance - Patient < 25%     Toileting Toileting    Toileting assist Assist for toileting: Minimal Assistance - Patient > 75%     Transfers Chair/bed transfer  Transfers assist     Chair/bed transfer assist level: Maximal Assistance - Patient 25 - 49%     Locomotion Ambulation   Ambulation assist  Assist level: Maximal Assistance - Patient 25 - 49% Assistive device: No Device Max distance: 10 ft   Walk 10 feet activity   Assist     Assist level: Maximal Assistance - Patient 25 - 49% Assistive device: No Device   Walk 50 feet activity   Assist Walk 50 feet with 2 turns activity did not occur: Safety/medical concerns         Walk 150 feet activity   Assist Walk 150 feet activity did not occur: Safety/medical concerns         Walk 10 feet on uneven surface   activity   Assist Walk 10 feet on uneven surfaces activity did not occur: Safety/medical concerns         Wheelchair     Assist Will patient use wheelchair at discharge?: No(tbd)             Wheelchair 50 feet with 2 turns activity    Assist            Wheelchair 150 feet activity     Assist            Medical Problem List and Plan: 1.  Left-sided weakness secondary to small right pontine infarction secondary small vessel disease on 07/30/2019.  Continue CIR 2.  Antithrombotics: -DVT/anticoagulation: Lovenox             -antiplatelet therapy: Aspirin 81 mg daily and Plavix 75 mg daily x3 weeks then aspirin alone 3. Pain Management: Tylenol as needed  Baclofen 5 nightly started on 11/15 for nightly leg spasms 4. Mood: Xanax 0.25 mg daily                         Team to provide emotional support             -antipsychotic agents: N/A 5. Neuropsych: This patient is capable of making decisions on her own behalf. 6. Skin/Wound Care: Routine skin checks 7. Fluids/Electrolytes/Nutrition: Routine in and outs.    CMP stable on 11/16 8.  History of CLL.  Followed outpatient by Dr. Marko Plume.    Afebrile  WBCs 33.0 on 11/13, increased to 35 on 11/16.  9.  Hypertension.  Patient on Lotrel 5-20 mg daily as needed prior to admission.  Labile on 11/15, monitor for trend and gradually normalize             Monitor with increased mobility 10.  Thrombocytopenia             Platelets 93 on 11/13, 129 on 11/16.  11. Insomnia: Patient continued to have leg spasms last night despite 5mg  of Baclofen, will increase to 10mg  tonight and will administer at 8:30pm since patient typically sleeps at 9pm. Will place general nursing order for chamomile tea to be given with dinner to promote sleep.  12. Disposition: Patient's daughter will be staying with her upon discharge for some time. Prior to admission she lived alone. She would benefit from fall safety device upon discharge  or upon outpatient follow-up if she returns to living alone.   LOS: 3 days A FACE TO FACE EVALUATION WAS PERFORMED  Tashunda Vandezande P Paula Busenbark 08/04/2019, 9:09 AM

## 2019-08-04 NOTE — Progress Notes (Signed)
Physical Therapy Session Note  Patient Details  Name: Abigail Moreno MRN: [id number] Date of Birth: 11/01/1928  Today's Date: 08/04/2019 PT Individual Time: 0800-0855 PT Individual Time Calculation (min): 55 min   Short Term Goals: Week 1:  PT Short Term Goal 1 (Week 1): Pt will perform bed<>chair transfer with min assist PT Short Term Goal 2 (Week 1): Pt will ambulate x 50 ft with LRAD and min assist PT Short Term Goal 3 (Week 1): Pt will initiate stair training  Skilled Therapeutic Interventions/Progress Updates:   Pt in w/c and agreeable to therapy, denies pain but intermittent c/o L lateral rib pain during transitional movements - resolves w/ seated rest. Total assist w/c transport to/from therapy gym. Worked on [employer] and gait training this session. Ambulated 40' w/ mod assist overall for lateral weight shifting and upright balance, no assist for LLE management. Although verbal and visual cues to increase L foot clearance and step length. Sit<>stands from mat w/ manual, tactile, and verbal cues for technique and anterior weight shifting to stabilize in stance. Performed w/ min assist w/o UE support, 10+ reps. Worked on LLE muscle activation on kinetron w/ therapist providing resistance on other pedal. Performed 4x10 reps and tactile cues for muscle activation at hamstring. Ambulated 10' and 30' after kinetron, improved L foot clearance noted and pt min assist overall. Pt stated she needed to urgently void. Returned to room and performed toilet transfer w/ max assist, pt continent of void. Ended session in w/c, all needs in reach.   Therapy Documentation Precautions:  Precautions Precautions: Fall Precaution Comments: L hemiparesis Restrictions Weight Bearing Restrictions: No  Therapy/Group: Individual Therapy  Mikaylah Libbey Clent Demark 08/04/2019, 9:29 AM

## 2019-08-04 NOTE — Progress Notes (Signed)
Occupational Therapy Session Note  Patient Details  Name: Abigail Moreno MRN: [id number] Date of Birth: [birth year]  Today's Date: 08/04/2019 OT Individual Time: 1400-1500 OT Individual Time Calculation (min): 60 min   Short Term Goals: Week 1:  OT Short Term Goal 1 (Week 1): Pt will don shirt with contact guard without chair support OT Short Term Goal 2 (Week 1): Pt will perform sit to stand with min A consistently for tranfsers and clothing management OT Short Term Goal 3 (Week 1): Pt will performed toilet transfers with min A with LRAD OT Short Term Goal 4 (Week 1): Pt will perform one grooming tasks in standing with contact guard.  Skilled Therapeutic Interventions/Progress Updates:    Pt greeted seated in wc and agreeable to OT treatment session. Pt brought to therapy gym and was issued soft beige theraputty. Worked on weight bearing with pushing down on thera-putty. OT facilitation for pushing and pulling to make a log with putty. Progressed to focus on pinch strength, then pt able to pick up large balls of putty and place in container with OT providing mod guided A. Pt wanted to write down a list for her daughter. OT provided pt with paper, and used L hand as a stabilizer while writing down list of belongings. Sit<>stand at high low table with mod A to facilitate hip/trunk extension. Towel pushes for neuro re-ed 3 sets of 30 with rest breaks in between. Pt returned to room at end of session and left seated in wc with chair alarm on, call bell in reach, and needs met.   Therapy Documentation Precautions:  Precautions Precautions: Fall Precaution Comments: L hemiparesis Restrictions Weight Bearing Restrictions: No Pain: Pain Assessment Pain Scale: 0-10 Pain Score: 0-No pain    Therapy/Group: Individual Therapy  Valma Cava 08/04/2019, 3:12 PM

## 2019-08-04 NOTE — Care Management Note (Signed)
Inpatient [facility] Individual Statement of Services  Patient Name:  Abigail Moreno  Date:  08/04/2019  Welcome to the [facility].  Our goal is to provide you with an individualized program based on your diagnosis and situation, designed to meet your specific needs.  With this comprehensive rehabilitation program, you will be expected to participate in at least 3 hours of rehabilitation therapies Monday-Friday, with modified therapy programming on the weekends.  Your rehabilitation program will include the following services:  Physical Therapy (PT), Occupational Therapy (OT), Speech Therapy (ST), 24 hour per day rehabilitation nursing, Case Management (Social Worker), Rehabilitation Medicine, Nutrition Services and Pharmacy Services  Weekly team conferences will be held on Wedensday to discuss your progress.  Your Social Worker will talk with you frequently to get your input and to update you on team discussions.  Team conferences with you and your family in attendance may also be held.  Expected length of stay: 2-2.5 weeks  Overall anticipated outcome: supervision with cueing  Depending on your progress and recovery, your program may change. Your Social Worker will coordinate services and will keep you informed of any changes. Your Social Worker's name and contact numbers are listed  below.  The following services may also be recommended but are not provided by the [facility] will be made to provide these services after discharge if needed.  Arrangements include referral to agencies that provide these services.  Your insurance has been verified to be:  Health team Advantage Your primary doctor is:  Mayra Neer  Pertinent information will be shared with your doctor and your insurance company.  Social Worker:   Ovidio Kin, [place] or ([phone]  Information discussed with and copy given to patient by: Elease Hashimoto, 08/04/2019, 9:04 AM

## 2019-08-05 ENCOUNTER — Inpatient Hospital Stay (HOSPITAL_COMMUNITY): Payer: PPO | Admitting: Occupational Therapy

## 2019-08-05 ENCOUNTER — Inpatient Hospital Stay (HOSPITAL_COMMUNITY): Payer: PPO

## 2019-08-05 ENCOUNTER — Inpatient Hospital Stay (HOSPITAL_COMMUNITY): Payer: PPO | Admitting: Physical Therapy

## 2019-08-05 DIAGNOSIS — G479 Sleep disorder, unspecified: Secondary | ICD-10-CM

## 2019-08-05 MED ORDER — BACLOFEN 20 MG PO TABS
20.0000 mg | ORAL_TABLET | Freq: Every day | ORAL | Status: DC
  Administered 2019-08-05 – 2019-08-07 (×3): 20 mg via ORAL
  Filled 2019-08-05 (×3): qty 1

## 2019-08-05 MED ORDER — PRO-STAT SUGAR FREE PO LIQD
30.0000 mL | Freq: Two times a day (BID) | ORAL | Status: DC
  Administered 2019-08-05 – 2019-08-27 (×44): 30 mL via ORAL
  Filled 2019-08-05 (×46): qty 30

## 2019-08-05 NOTE — Progress Notes (Signed)
Physical Therapy Session Note  Patient Details  Name: Abigail Moreno MRN: [id number] Date of Birth: Sep 23, 1928  Today's Date: 08/05/2019 PT Individual Time: 1037-1120 PT Individual Time Calculation (min): 43 min   Short Term Goals: Week 1:  PT Short Term Goal 1 (Week 1): Pt will perform bed<>chair transfer with min assist PT Short Term Goal 2 (Week 1): Pt will ambulate x 50 ft with LRAD and min assist PT Short Term Goal 3 (Week 1): Pt will initiate stair training  Skilled Therapeutic Interventions/Progress Updates:    Pt received sitting in w/c and agreeable to therapy session.  Transported to/from gym in w/c. Stand pivot w/c>EOM using RW with mod assist for balance and max multimodal cuing for step-by-step sequencing of AD management and LE stepping. Sit<>stands using RW with min assist for balance and max cuing for proper UE placement on/off AD. Ambulated 40ft x2 using RW with mod assist for balance - very slow gait speed with increased time for L LE swing through (some ankle inversion during swing), poor L foot clearance with foot drag, and increased knee flexion during stance - cuing for increased hip abduction and external rotation for improved foot placement. Attempted standing without UE support on RW L LE hip abduction to external target; however, pt unable to motor plan to perform movement despite max cuing. Sit>stand using RW with min assist and stand pivot to w/c with mod assist for balance. Transported back to room and left seated in w/c with needs in reach and seat belt alarm on.  Therapy Documentation Precautions:  Precautions Precautions: Fall Precaution Comments: L hemiparesis Restrictions Weight Bearing Restrictions: No  Pain: Reports L rib pain - "it hurts a little bit but not bad" rests provided for pain management throughout session.   Therapy/Group: Individual Therapy  Tawana Scale, PT, DPT 08/05/2019, 7:58 AM

## 2019-08-05 NOTE — Progress Notes (Addendum)
[place] PHYSICAL MEDICINE & REHABILITATION PROGRESS NOTE  Subjective/Complaints: Patient seen laying in bed this morning.  She states she did not sleep well overnight due to leg spasms.  She denies any spasms during the day.  ROS: Denies CP, SOB, N/V/D  Objective: Vital Signs: Blood pressure 123/63, pulse 67, temperature 98 F (36.7 C), resp. rate 18, height 5' (1.524 m), weight 63.2 kg, SpO2 96 %. No results found. Recent Labs    08/04/19 0531  WBC 35.2*  HGB 13.5  HCT 41.2  PLT 129*   Recent Labs    08/04/19 0531  NA 139  K 3.7  CL 105  CO2 22  GLUCOSE 89  BUN 10  CREATININE 0.53  CALCIUM 8.6*    Physical Exam: BP 123/63 (BP Location: Right Arm)   Pulse 67   Temp 98 F (36.7 C)   Resp 18   Ht 5' (1.524 m)   Wt 63.2 kg   SpO2 96%   BMI 27.21 kg/m  Constitutional: No distress . Vital signs reviewed. HENT: Facial bruising with edema Eyes: EOMI. No discharge. Cardiovascular: No JVD. Respiratory: Normal effort.  No stridor. GI: Non-distended. Skin: Warm and dry.  Intact. Psych: Normal mood.  Normal behavior. Musc: No edema in extremities.  No tenderness in extremities. Bilateral hallux valgus deformities Neurological: Alert Follows simple commands.   HOH Motor: Right upper extremity/right lower extremity: 5/5 proximal distal Left upper extremity: Shoulder abduction, elbow flexion/extension 4 -/5, handgrip 3+/5 with apraxia Left lower extremity: 4-/5 hip flexion, knee extension, 2/5 ankle dorsiflexion No increase in tone appreciated  Assessment/Plan: 1. Functional deficits secondary to right pontine infarct which require 3+ hours per day of interdisciplinary therapy in a comprehensive inpatient rehab setting.  Physiatrist is providing close team supervision and 24 hour management of active medical problems listed below.  Physiatrist and rehab team continue to assess barriers to discharge/monitor patient progress toward functional and medical  goals  Care Tool:  Bathing    Body parts bathed by patient: Left arm, Chest, Abdomen, Right upper leg, Left upper leg, Face, Front perineal area   Body parts bathed by helper: Buttocks, Right arm, Right lower leg, Left lower leg     Bathing assist Assist Level: Moderate Assistance - Patient 50 - 74%     Upper Body Dressing/Undressing Upper body dressing   What is the patient wearing?: Pull over shirt    Upper body assist Assist Level: Moderate Assistance - Patient 50 - 74%    Lower Body Dressing/Undressing Lower body dressing      What is the patient wearing?: Incontinence brief, Pants     Lower body assist Assist for lower body dressing: Total Assistance - Patient < 25%     Toileting Toileting    Toileting assist Assist for toileting: Minimal Assistance - Patient > 75%     Transfers Chair/bed transfer  Transfers assist     Chair/bed transfer assist level: Moderate Assistance - Patient 50 - 74%     Locomotion Ambulation   Ambulation assist      Assist level: Moderate Assistance - Patient 50 - 74% Assistive device: Walker-rolling Max distance: 40'   Walk 10 feet activity   Assist     Assist level: Moderate Assistance - Patient - 50 - 74% Assistive device: Walker-rolling   Walk 50 feet activity   Assist Walk 50 feet with 2 turns activity did not occur: Safety/medical concerns         Walk 150 feet activity  Assist Walk 150 feet activity did not occur: Safety/medical concerns         Walk 10 feet on uneven surface  activity   Assist Walk 10 feet on uneven surfaces activity did not occur: Safety/medical concerns         Wheelchair     Assist Will patient use wheelchair at discharge?: No(tbd)             Wheelchair 50 feet with 2 turns activity    Assist            Wheelchair 150 feet activity     Assist            Medical Problem List and Plan: 1.  Left-sided weakness secondary to small  right pontine infarction secondary small vessel disease on 07/30/2019.  Continue CIR 2.  Antithrombotics: -DVT/anticoagulation: Lovenox             -antiplatelet therapy: Aspirin 81 mg daily and Plavix 75 mg daily x3 weeks then aspirin alone 3. Pain Management: Tylenol as needed  Baclofen nightly started on 11/15, increased to 20 on 11/17 4. Mood: Xanax 0.25 mg daily                         Team to provide emotional support             -antipsychotic agents: N/A 5. Neuropsych: This patient is capable of making decisions on her own behalf. 6. Skin/Wound Care: Routine skin checks 7. Fluids/Electrolytes/Nutrition: Routine in and outs.   8.  History of CLL.  Followed outpatient by Dr. Marko Plume.    Afebrile  WBCs 35.2 on 11/16 9.  Hypertension.  Patient on Lotrel 5-20 mg daily as needed prior to admission.  Controlled on 11/17             Monitor with increased mobility 10.  Thrombocytopenia             Platelets 129 on 11/16.  11.  Sleep disturbance:   See #3   Chamomile tea ordered as well 12. Hypoalbuminemia   Supplement initiated on 11/17  LOS: 4 days A FACE TO FACE EVALUATION WAS PERFORMED  Eulalah Rupert Lorie Phenix 08/05/2019, 8:53 AM

## 2019-08-05 NOTE — Progress Notes (Signed)
Physical Therapy Session Note  Patient Details  Name: Abigail Moreno MRN: [id number] Date of Birth: November 17, 1928  Today's Date: 08/05/2019 PT Individual Time: [id number] PT Individual Time Calculation (min): 70 min   Short Term Goals: Week 1:  PT Short Term Goal 1 (Week 1): Pt will perform bed<>chair transfer with min assist PT Short Term Goal 2 (Week 1): Pt will ambulate x 50 ft with LRAD and min assist PT Short Term Goal 3 (Week 1): Pt will initiate stair training  Skilled Therapeutic Interventions/Progress Updates:    Pt seated in w/c upon PT arrival, agreeable to therapy tx and reports pain 8/10 in L ribs, repositioning and monitoring throughout session for pain relief. Pt transported to the gym. Pt ambulated x 10 ft from w/c>mat with RW and mod assist, cues for increased L knee extension during stance, cues for L foot clearance. Pt performed 2 x 5 sit<>stands this session without AD and with min assist, emphasis on achieving full extension in standing, cues for techniques. Pt worked on standing balance without UE support to perform horseshoe toss activity x 2 trials with CGA for balance. Pt performed x 10 mini squats without UE support this session working on knee control and L NMR, cues for techniques. Pt worked on dynamic standing balance without UE support and L LE stance control to perform R LE forwards/backwards steps in place and lateral steps in place 2 x 10 each, min-mod assist with facilitation for increased L hip extension and tactile cues for increased L knee extension. Pt worked on gait training this session with addition of DF ace wrap to assist with L LE foot clearance during swing, pt ambulated x 40 ft with RW and mod assist, pt with L lateral lean, facilitation for R lateral weightshift. Pt worked on L foot clearance to perform toe taps on aerobic step with L LE (DF ace wrap donned), UE support on RW and pt requiring active assist for hip flexion to perform taps x 10. Seated  edge of mat pt performed x 10 active assisted hip flexion for strengthening/neuro re-ed with L LE. Seated using the scooter board pt performed L LE knee flexion/extension working on Technical sales engineer for neuro re-ed, cues for techniques. Pt worked on L foot clearance and swing phase to perform cone kicking activity while standing with RW x 5 kicks, min-mod assist, following L swing pt with L LE adduction and increased L lateral lean requiring cues and facilitation to maintain R lateral weightshift and assist for L foot placement. Pt ambulated x 20 ft back to w/c with mod assist and RW, transported back to room and left in w/c with needs in reach and chair alarm set. Called pt's daughter over the phone to ask about getting the patient tennis shoes as pt may need a brace or AFO, will continue to assess as pt progresses.   Therapy Documentation Precautions:  Precautions Precautions: Fall Precaution Comments: L hemiparesis Restrictions Weight Bearing Restrictions: No    Therapy/Group: Individual Therapy  Netta Corrigan, PT, DPT, CSRS 08/05/2019, 12:47 PM

## 2019-08-05 NOTE — Plan of Care (Signed)
  Problem: RH BOWEL ELIMINATION Goal: RH STG MANAGE BOWEL WITH ASSISTANCE Description: STG Manage Bowel with min Assistance. Outcome: Progressing Goal: RH STG MANAGE BOWEL W/MEDICATION W/ASSISTANCE Description: STG Manage Bowel with Medication with min Assistance. Outcome: Progressing   Problem: RH BLADDER ELIMINATION Goal: RH STG MANAGE BLADDER WITH ASSISTANCE Description: STG Manage Bladder With min Assistance Outcome: Progressing   Problem: RH SKIN INTEGRITY Goal: RH STG MAINTAIN SKIN INTEGRITY WITH ASSISTANCE Description: STG Maintain Skin Integrity With min Assistance. Outcome: Progressing Goal: RH STG ABLE TO PERFORM INCISION/WOUND CARE W/ASSISTANCE Description: STG Able To Perform Incision/Wound Care With min Assistance. Outcome: Progressing   Problem: RH SAFETY Goal: RH STG ADHERE TO SAFETY PRECAUTIONS W/ASSISTANCE/DEVICE Description: STG Adhere to Safety Precautions With Cues and reminder Assistance/Device. Outcome: Progressing   Problem: RH COGNITION-NURSING Goal: RH STG USES MEMORY AIDS/STRATEGIES W/ASSIST TO PROBLEM SOLVE Description: STG Uses Memory Aids/Strategies With cues and reminders Assistance to Problem Solve. Outcome: Progressing Goal: RH STG ANTICIPATES NEEDS/CALLS FOR ASSIST W/ASSIST/CUES Description: STG Anticipates Needs/Calls for Assist With cues and reminders Assistance/Cues. Outcome: Progressing   Problem: RH PAIN MANAGEMENT Goal: RH STG PAIN MANAGED AT OR BELOW PT'S PAIN GOAL Description: Pain less than 2. Outcome: Progressing   Problem: RH KNOWLEDGE DEFICIT Goal: RH STG INCREASE KNOWLEDGE OF STROKE PROPHYLAXIS Description: Pt will be able to demonstrate understanding of adherence to medications to prevent stroke, and follow the DASH diet with mod I assist using handouts. Outcome: Progressing

## 2019-08-05 NOTE — Progress Notes (Signed)
Occupational Therapy Session Note  Patient Details  Name: Abigail Moreno MRN: [id number] Date of Birth: November 11, 1928  Today's Date: 08/05/2019 OT Individual Time: 2229-7989 OT Individual Time Calculation (min): 74 min ([phone] unattended estim)   Short Term Goals: Week 1:  OT Short Term Goal 1 (Week 1): Pt will don shirt with contact guard without chair support OT Short Term Goal 2 (Week 1): Pt will perform sit to stand with min A consistently for tranfsers and clothing management OT Short Term Goal 3 (Week 1): Pt will performed toilet transfers with min A with LRAD OT Short Term Goal 4 (Week 1): Pt will perform one grooming tasks in standing with contact guard.  Skilled Therapeutic Interventions/Progress Updates:    Pt received in bed ready for therapy.  ADL Retraining: pt did not want to shower today but did need to toilet and dress.  See details below, but overall mod A with tasks. Pt does try very hard to use her LUE in dressing tasks but needs hand over hand guidance to facilitate reach and grasp.    Transfers: used squat pivots today from bed to chair to toilet and back with mod A to guide hips to swivel. Pt does well with forward wt shift to lift hips.  Balance: sitting - close S as pt was able to lean to cleanse self post toileting  Standing - min A with static standing with RW, mod A with dynamic to reach to manage clothing with mod A to pull pants over hips.  Neuromuscular Re-Education: -Before getting out of bed, had pt in full supine with head flat.  Worked on scapular a/arom, placing and holding exercises with arm overhead with pt unable to hold without support, a/arom tricep overhead extension, reaching arm to 120 degrees flexion, and PNF d1 and d2 movements. -rolling onto L side with min A to push up to sit with mod A -with saebo stim on L deltoid (pt positioned in w/c), pt worked on full arm reach forward with weight of arm supported on UE ranger.  A to keep arm in  line as without support pt's arm falls inward.  Pt tolerated estim well (4 levels of intensity) for the FES.  Left on shoulder for 45 min for cyclic estim.  Removed at start of her PT session.  No adverse effects and pt stated she really likes how it feels.   Saebo Stim Go 330 pulse width 35 Hz pulse rate On 8 sec/ off 8 sec Ramp up/ down 2 sec Symmetrical Biphasic wave form  Max intensity 134m at 500 Ohm load  At end of session pt in w/c with belt alarm on and all needs met. Call light in reach.  Therapy Documentation Precautions:  Precautions Precautions: Fall Precaution Comments: L hemiparesis Restrictions Weight Bearing Restrictions: No    Vital Signs: Therapy Vitals Temp: 98 F (36.7 C) Pulse Rate: 67 Resp: 18 BP: 123/63 Patient Position (if appropriate): Lying Oxygen Therapy SpO2: 96 % O2 Device: Room Air Pain: Pain Assessment Pain Score: 8  Pain Type: Acute pain Pain Location: Rib cage Pain Orientation: Left Pain Descriptors / Indicators: Aching Pain Onset: With Activity Pain Intervention(s): RN made aware ADL: ADL Upper Body Dressing: Moderate assistance Where Assessed-Upper Body Dressing: Sitting at sink, Wheelchair Lower Body Dressing: Maximal assistance Where Assessed-Lower Body Dressing: Sitting at sink, Wheelchair Toileting: Maximal assistance Where Assessed-Toileting: TGlass blower/designer Moderate assistance Toilet Transfer Method: Squat pivot Toilet Transfer Equipment: Grab bars    Therapy/Group: Individual  Therapy  Yaseen Gilberg 08/05/2019, 9:10 AM

## 2019-08-06 ENCOUNTER — Inpatient Hospital Stay (HOSPITAL_COMMUNITY): Payer: PPO

## 2019-08-06 ENCOUNTER — Inpatient Hospital Stay (HOSPITAL_COMMUNITY): Payer: PPO | Admitting: Occupational Therapy

## 2019-08-06 NOTE — Plan of Care (Signed)
  Problem: Consults Goal: RH STROKE PATIENT EDUCATION Description: See Patient Education module for education specifics  Outcome: Progressing Goal: Nutrition Consult-if indicated Outcome: Progressing   Problem: RH BOWEL ELIMINATION Goal: RH STG MANAGE BOWEL WITH ASSISTANCE Description: STG Manage Bowel with min Assistance. Outcome: Progressing Goal: RH STG MANAGE BOWEL W/MEDICATION W/ASSISTANCE Description: STG Manage Bowel with Medication with min Assistance. Outcome: Progressing   Problem: RH BLADDER ELIMINATION Goal: RH STG MANAGE BLADDER WITH ASSISTANCE Description: STG Manage Bladder With min Assistance Outcome: Progressing   Problem: RH SKIN INTEGRITY Goal: RH STG MAINTAIN SKIN INTEGRITY WITH ASSISTANCE Description: STG Maintain Skin Integrity With min Assistance. Outcome: Progressing Goal: RH STG ABLE TO PERFORM INCISION/WOUND CARE W/ASSISTANCE Description: STG Able To Perform Incision/Wound Care With min Assistance. Outcome: Progressing   Problem: RH SAFETY Goal: RH STG ADHERE TO SAFETY PRECAUTIONS W/ASSISTANCE/DEVICE Description: STG Adhere to Safety Precautions With Cues and reminder Assistance/Device. Outcome: Progressing   Problem: RH COGNITION-NURSING Goal: RH STG USES MEMORY AIDS/STRATEGIES W/ASSIST TO PROBLEM SOLVE Description: STG Uses Memory Aids/Strategies With cues and reminders Assistance to Problem Solve. Outcome: Progressing Goal: RH STG ANTICIPATES NEEDS/CALLS FOR ASSIST W/ASSIST/CUES Description: STG Anticipates Needs/Calls for Assist With cues and reminders Assistance/Cues. Outcome: Progressing   Problem: RH PAIN MANAGEMENT Goal: RH STG PAIN MANAGED AT OR BELOW PT'S PAIN GOAL Description: Pain less than 2. Outcome: Progressing   Problem: RH KNOWLEDGE DEFICIT Goal: RH STG INCREASE KNOWLEDGE OF STROKE PROPHYLAXIS Description: Pt will be able to demonstrate understanding of adherence to medications to prevent stroke, and follow the DASH diet with  mod I assist using handouts. Outcome: Progressing

## 2019-08-06 NOTE — Progress Notes (Signed)
Social Work Patient ID: Abigail Moreno, female   DOB: May 27, 1929, 85 y.o.   MRN: [id number]  Met with pt and daughter who is here to inform team conference goals supervision level and target discharge date 12/2. Both feel she is doing well and hopeful to reach the goals. Daughter has to be on the phone while she is working and can not have too many breaks to assist Mom. Will have come in next week to attend therapies with pt and begin learning her care.

## 2019-08-06 NOTE — Progress Notes (Signed)
Physical Therapy Session Note  Patient Details  Name: Abigail Moreno MRN: [id number] Date of Birth: 1929/01/14  Today's Date: 08/06/2019 PT Individual Time: [id number] and [id number] PT Individual Time Calculation (min): 56 min and 58 min    Short Term Goals: Week 1:  PT Short Term Goal 1 (Week 1): Pt will perform bed<>chair transfer with min assist PT Short Term Goal 2 (Week 1): Pt will ambulate x 50 ft with LRAD and min assist PT Short Term Goal 3 (Week 1): Pt will initiate stair training  Skilled Therapeutic Interventions/Progress Updates:   Session 1: Pt seated in w/c upon PT arrival, agreeable to therapy tx and denies pain. Pt's daughter present in the room, discussed target d/c date, discussed home set up with 3 STE and L rail, pt is planning to go stay at her daughters house. Pt's daughter works from home and can provide supervision/assist. Pt's daughter brought in tennis shoes to try, therapist donned teds and shoes total assist for time management. Pt transported to the gym. Trial of PLS L AFO with shoe cover this session, therapist donned AFO total assist for time management. Pt ambulated x 30 ft and x 40 ft this session with RW and min-mod assist, cues for L foot placement, cues for L knee extension during stance, cues for correction of L lateral lean especially with turns. Therapist doffed brace. Pt worked on L neuro re-ed and knee control to perform the following activities without UE support- x 10 mini squats, 2 x 10 R LE steps in place, x 10 R LE lateral steps in place, x 10 sit<>stands without UEs to push up. Seated edge of mat pt worked on L UE neuro re-ed to perform active assisted shoulder flexion to 90 degrees x 10, active assisted shoulder ER x 10 and then active wrist extension x 10. Pt transferred to w/c with min assist, transported back to room and left in w/c with needs in reach and chair alarm set.   Session 2: Pt seated in w/c upon PT arrival, agreeable to therapy tx  and denies pain at rest. Pt reports having to use the bathroom, stand pivot this session from w/c<>toilet with use of grab bars with min assist, cues for techniques. Pt maintained standing balance min assist while performing clothing management min assist, continent of bladder and supervision for pericare. Pt transported to the sink and washed hands with assist to reach soap and paper towels. Pt reports her saliva tastes like blood, RN notified. Pt rinsed mouth with warm water in the sink. Pt transported to the gym. Pt performed sit<>stand from w/c with min assist, attempted to transfer to tall kneeling on mat with UE support on bench, pt able to bring R LE up but then becomes fearful. Instead pt worked on standing exercise with L UE weightbearing through elbow using bench while performing bean bag reaching task with R UE. Pt then agreeable to try once more getting into tall kneeling position. Pt transferred from w/c>standing>tall kneeling with UE support on bench with mod assist, once in tall kneeling worked on hip strength to perform x 10 hip extensions sitting back towards heels and then extending up, cues for techniques. Pt worked on postural control and balance to perform reaching activity while in tall kneeling. Pt worked on L UE weightbearing in modified quadruped on elbows position with bench while performing reaching task with R UE. Pt transferred to sitting with mod assist. Pt seated edge of mat worked on L UE neuro  re-ed to perform reaching/cup stacking activity. Pt performed sitting<>sidesit on L elbow with emphasis on using L UE to push back up working on tricep activation/strength. Pt seated edge of mat worked on L UE isometric shoulder flexion/extension against manual resistance from therapist for neuro re-ed. Pt transferred to w/c with RW and mod assist towards L, transported back to room and left in w/c with needs in reach and chair alarm set.    Therapy Documentation Precautions:   Precautions Precautions: Fall Precaution Comments: L hemiparesis Restrictions Weight Bearing Restrictions: No    Therapy/Group: Individual Therapy  Netta Corrigan, PT, DPT  08/06/2019, 7:57 AM

## 2019-08-06 NOTE — Patient Care Conference (Signed)
Inpatient RehabilitationTeam Conference and Plan of Care Update Date: 08/06/2019   Time: 11:10 AM   Patient Name: Abigail Moreno      Medical Record Number: [id number]  Date of Birth: 07/07/1929 Sex: Female         Room/Bed: 4W15C/4W15C-01 Payor Info: Payor: [employer] / Plan: [employer] PPO / Product Type: *No Product type* /    Admit Date/Time:  08/01/2019  6:47 PM  Primary Diagnosis:  Right pontine CVA [facility])  Patient Active Problem List   Diagnosis Date Noted  . Sleep disturbance   . Labile blood pressure   . Muscle spasm of left lower extremity   . Personal history of CLL (chronic lymphocytic leukemia)   . Pressure injury of skin 08/01/2019  . Right pontine CVA ([place]) 08/01/2019  . Acute CVA (cerebrovascular accident) ([place]) 07/30/2019  . Essential hypertension 07/30/2019  . Overweight (BMI 25.0-29.9) 09/18/2018  . S/P left THA, AA 09/17/2018  . Environmental allergies 07/19/2016  . History of left breast cancer 10/23/2015  . Status post hysterectomy 06/15/2015  . Unresolved grief 06/15/2015  . Thrombocytopenia ([place]) 06/15/2015  . Need for prophylactic vaccination and inoculation against influenza 06/15/2015  . Mixed hyperlipidemia 03/16/2015  . Osteopenia 03/16/2015  . Osteoarthritis 03/16/2015  . Vaginal atrophy 03/16/2015  . Papanicolaou smear for cervical cancer screening 03/16/2015  . Macular degeneration 01/07/2015  . CLL (chronic lymphocytic leukemia) ([place]) 11/18/2011  . Breast cancer ([place]) 11/18/2011    Expected Discharge Date: Expected Discharge Date: 08/20/19  Team Members Present: Physician leading conference: Dr. Delice Lesch Social Worker Present: Ovidio Kin, LCSW Nurse Present: Judee Clara, LPN Case Manager: Karene Fry, RN PT Present: Michaelene Song, PT OT Present: Clyda Greener, OT SLP Present: Charolett Bumpers, SLP PPS Coordinator present : Ileana Ladd, PT     Current Status/Progress Goal Weekly Team Focus   Bowel/Bladder   contient of B/B during the day, urgency during night LBM:11/16  timed tolienting during the day while awake  assist with tolient qs and prn   Swallow/Nutrition/ Hydration             ADL's   min A UB bathing, mod LB bathing, mod toileting, mod UB dressing, max LB dressing, min sit to stand and static stand, transfers min -mod A, developing AROM and use of LUE as a stabilizer  Supervision overall  LUE NMR, estim, balance, ADL training, pt/ family education   Mobility   min assist for bed mobility and sit<>stands, mod assist for transfers/gait with RW up to 30 ft  supervision  balance, gait, transfers, strengthening, L UE/LE neuro re-ed, education   Communication             Safety/Cognition/ Behavioral Observations            Pain   c/o of muscle spasms in legs  continue to give schedule balcofen  asses pain qs and prn   Skin   brusing on left stomach, face &nose  remain free of any new brusing  assess skin qs and prn      *See Care Plan and progress notes for long and short-term goals.     Barriers to Discharge  Current Status/Progress Possible Resolutions Date Resolved   Nursing                  PT                    OT  SLP                SW                Discharge Planning/Teaching Needs:  Going to daughter;s home at discharge where daughter works from home and can be there with her. Pt hopes to return to her home eventually      Team Discussion: h/o CLL, spasms at night, started meds, monitoring labs, watching platelets.  RN - A+O, cont, tearful at times.  OT mod trahsfers, UB B/D min/mod, LB B/D mod/max, goals S.  PT mod A transfers and gait 40-50', L side weakness, ? Need AFO.  Dtr to bring in shoes.  No SLP.   Revisions to Treatment Plan: N/A     Medical Summary Current Status: Left-sided weakness secondary to small right pontine infarction secondary small vessel disease on 07/30/2019. Weekly Focus/Goal: Improve mobility,  LE spasms, platelets, sleep  Barriers to Discharge: Medical stability   Possible Resolutions to Barriers: Therapies, sleep improving, follow labs   Continued Need for Acute Rehabilitation Level of Care: The patient requires daily medical management by a physician with specialized training in physical medicine and rehabilitation for the following reasons: Direction of a multidisciplinary physical rehabilitation program to maximize functional independence : Yes Medical management of patient stability for increased activity during participation in an intensive rehabilitation regime.: Yes Analysis of laboratory values and/or radiology reports with any subsequent need for medication adjustment and/or medical intervention. : Yes   I attest that I was present, lead the team conference, and concur with the assessment and plan of the team.   Retta Diones 08/06/2019, 8:33 PM  Team conference was held via web/ teleconference due to [place] - 19

## 2019-08-06 NOTE — Progress Notes (Signed)
Occupational Therapy Session Note  Patient Details  Name: Abigail Moreno MRN: [id number] Date of Birth: 12-04-1928  Today's Date: 08/06/2019 OT Individual Time: [id number] OT Individual Time Calculation (min): 70 min    Short Term Goals: Week 1:  OT Short Term Goal 1 (Week 1): Pt will don shirt with contact guard without chair support OT Short Term Goal 2 (Week 1): Pt will perform sit to stand with min A consistently for tranfsers and clothing management OT Short Term Goal 3 (Week 1): Pt will performed toilet transfers with min A with LRAD OT Short Term Goal 4 (Week 1): Pt will perform one grooming tasks in standing with contact guard.  Skilled Therapeutic Interventions/Progress Updates:    Pt completed bathing and dressing to start session.  Mod assist for functional mobility using the RW with hand splint on the left for support.  She needed max assist to undress sit to stand on the shower bench before bathing.  Mod assist for LB bathing with min assist for UB bathing.  She needed mod hand over hand to use the LUE for washing the right arm.  She was able to complete sit to stand with min assist in the shower and at the sink during session.  Mod assist for stand pivot transfer to the wheelchair for dressing at the sink.  She was able to complete UB dressing with mod assist but needed max assist for LB.  Grooming tasks of brushing her hair and her teeth with setup from the wheelchair.  Pt left in the wheelchair at end of session with application of SAEBO go. See parameters below.  Pt with no adverse reactions to therapy.    Saebo Stim Go 330 pulse width 35 Hz pulse rate On 8 sec/ off 8 sec Ramp up/ down 2 sec Symmetrical Biphasic wave form  Max intensity 163mA at 500 Ohm load     Therapy Documentation Precautions:  Precautions Precautions: Fall Precaution Comments: L hemiparesis Restrictions Weight Bearing Restrictions: No   Pain: Pain Assessment Pain Scale: Faces Faces  Pain Scale: Hurts a little bit Pain Type: Acute pain Pain Location: Rib cage Pain Orientation: Left Pain Descriptors / Indicators: Discomfort Pain Onset: With Activity Pain Intervention(s): Repositioned;Emotional support ADL: See Care Tool Section for some details of mobility and selfcare  Therapy/Group: Individual Therapy  Ely Ballen OTR/L 08/06/2019, 10:44 AM

## 2019-08-06 NOTE — Progress Notes (Signed)
[place] PHYSICAL MEDICINE & REHABILITATION PROGRESS NOTE  Subjective/Complaints: Patient seen sitting up in bed this morning.  She states she slept well overnight.  She states she did not have any leg spasms overnight.  She is looking for her glasses and her phone.  ROS: Denies CP, SOB, N/V/D  Objective: Vital Signs: Blood pressure 124/85, pulse 74, temperature 97.6 F (36.4 C), temperature source Oral, resp. rate 18, height 5' (1.524 m), weight 63.2 kg, SpO2 96 %. No results found. Recent Labs    08/04/19 0531  WBC 35.2*  HGB 13.5  HCT 41.2  PLT 129*   Recent Labs    08/04/19 0531  NA 139  K 3.7  CL 105  CO2 22  GLUCOSE 89  BUN 10  CREATININE 0.53  CALCIUM 8.6*    Physical Exam: BP 124/85 (BP Location: Right Arm)   Pulse 74   Temp 97.6 F (36.4 C) (Oral)   Resp 18   Ht 5' (1.524 m)   Wt 63.2 kg   SpO2 96%   BMI 27.21 kg/m  Constitutional: No distress . Vital signs reviewed. HENT: Facial bruising with edema, healing Eyes: EOMI. No discharge. Cardiovascular: No JVD. Respiratory: Normal effort.  No stridor. GI: Non-distended. Skin: Warm and dry.  Intact. Psych: Normal mood.  Normal behavior. Musc: No edema in extremities.  No tenderness in extremities. Bilateral hallux valgus deformities Neurological: Alert Follows simple commands.   HOH Motor: Right upper extremity/right lower extremity: 5/5 proximal distal Left upper extremity: Shoulder abduction, elbow flexion/extension 4 -/5, handgrip 3+/5 with apraxia, stable Left lower extremity: 4-/5 hip flexion, knee extension, 3/5 ankle dorsiflexion No increase in tone appreciated  Assessment/Plan: 1. Functional deficits secondary to right pontine infarct which require 3+ hours per day of interdisciplinary therapy in a comprehensive inpatient rehab setting.  Physiatrist is providing close team supervision and 24 hour management of active medical problems listed below.  Physiatrist and rehab team continue  to assess barriers to discharge/monitor patient progress toward functional and medical goals  Care Tool:  Bathing    Body parts bathed by patient: Left arm, Chest, Abdomen, Right upper leg, Left upper leg, Face, Front perineal area   Body parts bathed by helper: Buttocks, Right arm, Right lower leg, Left lower leg     Bathing assist Assist Level: Moderate Assistance - Patient 50 - 74%     Upper Body Dressing/Undressing Upper body dressing   What is the patient wearing?: Pull over shirt, Bra    Upper body assist Assist Level: Moderate Assistance - Patient 50 - 74%    Lower Body Dressing/Undressing Lower body dressing      What is the patient wearing?: Pants, Underwear/pull up     Lower body assist Assist for lower body dressing: Maximal Assistance - Patient 25 - 49%     Toileting Toileting    Toileting assist Assist for toileting: Moderate Assistance - Patient 50 - 74%     Transfers Chair/bed transfer  Transfers assist     Chair/bed transfer assist level: Moderate Assistance - Patient 50 - 74%     Locomotion Ambulation   Ambulation assist      Assist level: Moderate Assistance - Patient 50 - 74% Assistive device: Walker-rolling Max distance: 46ft   Walk 10 feet activity   Assist     Assist level: Moderate Assistance - Patient - 50 - 74% Assistive device: Walker-rolling   Walk 50 feet activity   Assist Walk 50 feet with 2 turns activity  did not occur: Safety/medical concerns         Walk 150 feet activity   Assist Walk 150 feet activity did not occur: Safety/medical concerns         Walk 10 feet on uneven surface  activity   Assist Walk 10 feet on uneven surfaces activity did not occur: Safety/medical concerns         Wheelchair     Assist Will patient use wheelchair at discharge?: No(tbd)             Wheelchair 50 feet with 2 turns activity    Assist            Wheelchair 150 feet activity      Assist            Medical Problem List and Plan: 1.  Left-sided weakness secondary to small right pontine infarction secondary small vessel disease on 07/30/2019.  Cont CIR  Team conference today to discuss current and goals and coordination of care, home and environmental barriers, and discharge planning with nursing, case manager, and therapies.  2.  Antithrombotics: -DVT/anticoagulation: Lovenox             -antiplatelet therapy: Aspirin 81 mg daily and Plavix 75 mg daily x3 weeks then aspirin alone 3. Pain Management: Tylenol as needed  Baclofen nightly started on 11/15, increased to 20 on 11/17 with improvement 4. Mood: Xanax 0.25 mg daily                         Team to provide emotional support             -antipsychotic agents: N/A 5. Neuropsych: This patient is capable of making decisions on her own behalf. 6. Skin/Wound Care: Routine skin checks 7. Fluids/Electrolytes/Nutrition: Routine in and outs.   8.  History of CLL.  Followed outpatient by Dr. Marko Plume.    Afebrile  WBCs 35.2 on 11/16, labs ordered for tomorrow 9.  Hypertension.  Patient on Lotrel 5-20 mg daily as needed prior to admission.  Controlled on 11/18             Monitor with increased mobility 10.  Thrombocytopenia             Platelets 129 on 11/16, labs ordered for tomorrow.  11.  Sleep disturbance:   See #3   Chamomile tea ordered as well  Improving 12. Hypoalbuminemia   Supplement initiated on 11/17  LOS: 5 days A FACE TO FACE EVALUATION WAS PERFORMED  Ankit Lorie Phenix 08/06/2019, 8:47 AM

## 2019-08-07 ENCOUNTER — Inpatient Hospital Stay (HOSPITAL_COMMUNITY): Payer: PPO | Admitting: Physical Therapy

## 2019-08-07 ENCOUNTER — Inpatient Hospital Stay (HOSPITAL_COMMUNITY): Payer: PPO | Admitting: Occupational Therapy

## 2019-08-07 LAB — CBC
HCT: 42.2 % (ref 36.0–46.0)
Hemoglobin: 13.3 g/dL (ref 12.0–15.0)
MCH: 30 pg (ref 26.0–34.0)
MCHC: 31.5 g/dL (ref 30.0–36.0)
MCV: 95.3 fL (ref 80.0–100.0)
Platelets: 131 10*3/uL — ABNORMAL LOW (ref 150–400)
RBC: 4.43 MIL/uL (ref 3.87–5.11)
RDW: 14.2 % (ref 11.5–15.5)
WBC: 32.9 10*3/uL — ABNORMAL HIGH (ref 4.0–10.5)
nRBC: 0 % (ref 0.0–0.2)

## 2019-08-07 NOTE — Progress Notes (Signed)
Occupational Therapy Session Note  Patient Details  Name: Abigail Moreno MRN: [id number] Date of Birth: 06-22-1929  Today's Date: 08/07/2019 OT Individual Time: [id number] OT Individual Time Calculation (min): 78 min    Short Term Goals: Week 1:  OT Short Term Goal 1 (Week 1): Pt will don shirt with contact guard without chair support OT Short Term Goal 2 (Week 1): Pt will perform sit to stand with min A consistently for tranfsers and clothing management OT Short Term Goal 3 (Week 1): Pt will performed toilet transfers with min A with LRAD OT Short Term Goal 4 (Week 1): Pt will perform one grooming tasks in standing with contact guard.  Skilled Therapeutic Interventions/Progress Updates:    Pt worked on [employer] to start session.  She utilized the LUE as a gross assist for holding her ice cream as she scooped it out with the RUE.  After completion of lunch, therapist took pt down to the ADL apartment where she practiced simulated walk-in shower transfers.  Therapist discussed the options of having a tub bench and being able to sit down and swing her legs into the shower, however at this point the shower has a door, and she is not sure if it is wide enough to allow for this.  Had her practice stepping forward into the shower with use of the RW for support and overall max assist.  She was not able to lift her LLE over the simulated edge without use of the AFO and therapist assist.  Feel that she will have to improve significantly with her balance and LLE strength for this technique of transfer to be used at home.  Will discuss shower setup with daughter in the future.  Next, had pt work on standing balance at the kitchen counter top while using the LUE to wash the counter.  Min facilitation for shoulder flexion on the counter with digits extended.  Also had her focus on maintaining upright standing posture and left knee and hip extension with mod facilitation.  Also had her work on picking  up balled up pieces of paper with the LUE and placing to target with mod assist as well.  Finished session with return to the room and pt completing toilet transfer to the elevated toilet with mod assist stand pivot.  Mod assist also needed for clothing management and toilet hygiene.  Pt left in the wheelchair at end of session with call button and phone in reach and safety belt in place.  Therapist placed NMES on the left medial deltoid for stimulation of the LUE.  See parameters below as well.  Pt with no adverse reactions to stimulation.   Saebo Stim Go 330 pulse width 35 Hz pulse rate On 8 sec/ off 8 sec Ramp up/ down 2 sec Symmetrical Biphasic wave form  Max intensity 185mA at 500 Ohm load  Therapy Documentation Precautions:  Precautions Precautions: Fall Precaution Comments: L hemiparesis Restrictions Weight Bearing Restrictions: No  Pain: Pain Assessment Pain Scale: Faces Pain Score: 0-No pain ADL: See Care Tool Section for some details of ADL tasks and mobility  Therapy/Group: Individual Therapy  Shallon Yaklin OTR/L 08/07/2019, 3:55 PM

## 2019-08-07 NOTE — Progress Notes (Signed)
Physical Therapy Session Note  Patient Details  Name: ROBINE Moreno MRN: [id number] Date of Birth: December 17, 1928  Today's Date: 08/07/2019 PT Individual Time: [id number] PT Individual Time Calculation (min): 76 min   Short Term Goals: Week 1:  PT Short Term Goal 1 (Week 1): Pt will perform bed<>chair transfer with min assist PT Short Term Goal 2 (Week 1): Pt will ambulate x 50 ft with LRAD and min assist PT Short Term Goal 3 (Week 1): Pt will initiate stair training  Skilled Therapeutic Interventions/Progress Updates:    Pt received sitting on toilet with nursing staff present and pt agreeable to therapy session; however, needs more time for toileting. Therapist assumed care of patient. Patient reporting increased difficulty having BM therefore therapist provided B LE foot support to improve pelvic alignment - notified NT. Donned clothes, including B LE TED hose and tennis shoes, while sitting on toilet with max assist for time management. Pt continent of bowel and bladder and required set-up assist for anterior peri-care and total assist for posterior pericare. Sit>stand toilet>grab bars with min assist for lifting and min assist for balance while therapist completed LB clothing management total assist. Stand pivot toilet>w/c using grab bars with min assist.  Transported to/from gym in w/c. Therapist donned L LE ankle DF assist ACE wrap. Ambulated 63ft using RW with min/mod assist for balance - demonstrated lack of L LE foot clearance with foot drag, sustained knee flexion during stance (tactile cuing for improvement) and multimodal cuing for sequencing of AD management and LE stepping while turning. Pt suddenly becoming emotional and tearful reporting that her daughter is waiting for some medical test results back and the patient is concerned about her - therapist provided emotional support for comfort. Performed L LE NMR focusing on hip/knee flexion for swing phase of gait to tap foot up/down on  4" step with min assist for balance and max assist for lifting/lowering L leg as pt doesn't have adequate hip flexion muscle activation - multimodal cuing for increased muscle activation in L LE, increased upright posture, and R lateral weight shifting. Seated L hip flexion with pt demonstrating muscle activation but not sufficient to lift foot from the floor. Standing L LE NMR targeting stance phase of L hip/knee extension of tapping R LE up/down on 4" step with min assist for balance and multimodal cuing of increased trunk upright, hip extension, and L lateral weightshift. Stand pivot EOM>w/c using RW with CGA/min assist for balance and max multimodal cuing for sequencing of L LE stepping and AD management. Transported back to room in w/c and left seated in w/c with needs in reach and seat belt alarm on.  Therapy Documentation Precautions:  Precautions Precautions: Fall Precaution Comments: L hemiparesis Restrictions Weight Bearing Restrictions: No  Pain:   Denies pain during session. But does report she had some L shoulder pain last night.    Therapy/Group: Individual Therapy  Tawana Scale, PT, DPT 08/07/2019, 7:53 AM

## 2019-08-07 NOTE — Progress Notes (Signed)
[place] PHYSICAL MEDICINE & REHABILITATION PROGRESS NOTE  Subjective/Complaints: Patient seen laying in bed this morning.  She states he slept fairly overnight.  She notes that she had some cramps, but overall they were mild.  She does not want to make any medication adjustments today.  ROS: Denies CP, SOB, N/V/D  Objective: Vital Signs: Blood pressure 124/71, pulse 74, temperature 98.3 F (36.8 C), temperature source Oral, resp. rate 18, height 5' (1.524 m), weight 63.2 kg, SpO2 96 %. No results found. No results for input(s): WBC, HGB, HCT, PLT in the last 72 hours. No results for input(s): NA, K, CL, CO2, GLUCOSE, BUN, CREATININE, CALCIUM in the last 72 hours.  Physical Exam: BP 124/71 (BP Location: Right Arm)   Pulse 74   Temp 98.3 F (36.8 C) (Oral)   Resp 18   Ht 5' (1.524 m)   Wt 63.2 kg   SpO2 96%   BMI 27.21 kg/m  Constitutional: No distress . Vital signs reviewed. HENT: Normocephalic.  Atraumatic.  Facial bruising with edema, healing Eyes: EOMI. No discharge. Cardiovascular: No JVD. Respiratory: Normal effort.  No stridor. GI: Non-distended. Skin: Warm and dry.  Intact. Psych: Normal mood.  Normal behavior. Musc: No edema in extremities.  No tenderness in extremities. Bilateral hallux valgus deformities Neurological: Alert Follows simple commands.   HOH Motor: Right upper extremity/right lower extremity: 5/5 proximal distal Left upper extremity: Shoulder abduction, elbow flexion/extension 4 -/5, handgrip 3+/5 with apraxia, unchanged Left lower extremity: 4-/5 hip flexion, knee extension, 3/5 ankle dorsiflexion, unchanged No increase in tone appreciated  Assessment/Plan: 1. Functional deficits secondary to right pontine infarct which require 3+ hours per day of interdisciplinary therapy in a comprehensive inpatient rehab setting.  Physiatrist is providing close team supervision and 24 hour management of active medical problems listed below.  Physiatrist  and rehab team continue to assess barriers to discharge/monitor patient progress toward functional and medical goals  Care Tool:  Bathing    Body parts bathed by patient: Left arm, Chest, Abdomen, Right upper leg, Left upper leg, Face, Front perineal area   Body parts bathed by helper: Buttocks, Right arm, Right lower leg, Left lower leg     Bathing assist Assist Level: Moderate Assistance - Patient 50 - 74%     Upper Body Dressing/Undressing Upper body dressing   What is the patient wearing?: Pull over shirt, Bra    Upper body assist Assist Level: Moderate Assistance - Patient 50 - 74%    Lower Body Dressing/Undressing Lower body dressing      What is the patient wearing?: Pants, Underwear/pull up     Lower body assist Assist for lower body dressing: Maximal Assistance - Patient 25 - 49%     Toileting Toileting    Toileting assist Assist for toileting: Moderate Assistance - Patient 50 - 74%     Transfers Chair/bed transfer  Transfers assist     Chair/bed transfer assist level: Moderate Assistance - Patient 50 - 74%     Locomotion Ambulation   Ambulation assist      Assist level: Moderate Assistance - Patient 50 - 74% Assistive device: Walker-rolling Max distance: 40 ft   Walk 10 feet activity   Assist     Assist level: Moderate Assistance - Patient - 50 - 74% Assistive device: Walker-rolling   Walk 50 feet activity   Assist Walk 50 feet with 2 turns activity did not occur: Safety/medical concerns         Walk 150 feet  activity   Assist Walk 150 feet activity did not occur: Safety/medical concerns         Walk 10 feet on uneven surface  activity   Assist Walk 10 feet on uneven surfaces activity did not occur: Safety/medical concerns         Wheelchair     Assist Will patient use wheelchair at discharge?: No(tbd)             Wheelchair 50 feet with 2 turns activity    Assist            Wheelchair 150  feet activity     Assist            Medical Problem List and Plan: 1.  Left-sided weakness secondary to small right pontine infarction secondary small vessel disease on 07/30/2019.  Continue CIR 2.  Antithrombotics: -DVT/anticoagulation: Lovenox             -antiplatelet therapy: Aspirin 81 mg daily and Plavix 75 mg daily x3 weeks then aspirin alone 3. Pain Management: Tylenol as needed  Baclofen nightly started on 11/15, increased to 20 on 11/17 with improvement, will continue current dose and consider further increase if necessary 4. Mood: Xanax 0.25 mg daily                         Team to provide emotional support             -antipsychotic agents: N/A 5. Neuropsych: This patient is capable of making decisions on her own behalf. 6. Skin/Wound Care: Routine skin checks 7. Fluids/Electrolytes/Nutrition: Routine in and outs.   8.  History of CLL.  Followed outpatient by Dr. Marko Plume.    Afebrile  WBCs 35.2 on 11/16, labs pending 9.  Hypertension.  Patient on Lotrel 5-20 mg daily as needed prior to admission.  Slightly labile on 11/19             Monitor with increased mobility 10.  Thrombocytopenia             Platelets 129 on 11/16, labs pending 11.  Sleep disturbance:   See #3   Chamomile tea ordered as well  Improving overall 12. Hypoalbuminemia   Supplement initiated on 11/17  LOS: 6 days A FACE TO FACE EVALUATION WAS PERFORMED  Bohden Dung Lorie Phenix 08/07/2019, 8:19 AM

## 2019-08-07 NOTE — Progress Notes (Signed)
Occupational Therapy Session Note  Patient Details  Name: Abigail Moreno MRN: [id number] Date of Birth: 05-Jun-1929  Today's Date: 08/07/2019 OT Individual Time: [id number] OT Individual Time Calculation (min): 29 min  and Today's Date: 08/07/2019 OT Missed Time: 15 Minutes Missed Time Reason: Other (comment)(eating breakfast)   Short Term Goals: Week 1:  OT Short Term Goal 1 (Week 1): Pt will don shirt with contact guard without chair support OT Short Term Goal 2 (Week 1): Pt will perform sit to stand with min A consistently for tranfsers and clothing management OT Short Term Goal 3 (Week 1): Pt will performed toilet transfers with min A with LRAD OT Short Term Goal 4 (Week 1): Pt will perform one grooming tasks in standing with contact guard.  Skilled Therapeutic Interventions/Progress Updates:    Upon entering the room, pt seated in bed with HOB elevated and eating breakfast. OT discussed home set up of discharge to daughter's home and pt will likely need a 3 in 1 commode chair. Her daughter has a walk in shower and already has a shower seat for pt. OT also providing pt with energy conservation handout and education while she continues to eat in regards to self care and community mobility tasks. Pt asking appropriate questions and actively participating in education but declines OOB activities until she finishes her breakfast. Pt remained eating with call bell and all needed items within reach and bed alarm activated.   Therapy Documentation Precautions:  Precautions Precautions: Fall Precaution Comments: L hemiparesis Restrictions Weight Bearing Restrictions: No General: General OT Amount of Missed Time: 15 Minutes Vital Signs: Therapy Vitals Temp: 98.3 F (36.8 C) Temp Source: Oral Pulse Rate: 74 Resp: 18 BP: 124/71 Patient Position (if appropriate): Lying Oxygen Therapy SpO2: 96 % O2 Device: Room Air Pain:   ADL: ADL Grooming: Setup, Minimal assistance Where  Assessed-Grooming: Sitting at sink Upper Body Bathing: Minimal assistance Where Assessed-Upper Body Bathing: Shower Lower Body Bathing: Minimal assistance Where Assessed-Lower Body Bathing: Shower Upper Body Dressing: Moderate assistance Where Assessed-Upper Body Dressing: Sitting at sink, Wheelchair Lower Body Dressing: Maximal assistance Where Assessed-Lower Body Dressing: Sitting at sink, Wheelchair Toileting: Maximal assistance Where Assessed-Toileting: Glass blower/designer: Moderate assistance Toilet Transfer Method: Stand pivot Science writer: Energy manager: Environmental education officer Method: Education officer, environmental: Grab bars, Nurse, learning disability    Praxis   Exercises:   Other Treatments:     Therapy/Group: Individual Therapy  Gypsy Decant 08/07/2019, 7:41 AM

## 2019-08-08 ENCOUNTER — Inpatient Hospital Stay (HOSPITAL_COMMUNITY): Payer: PPO | Admitting: Physical Therapy

## 2019-08-08 ENCOUNTER — Inpatient Hospital Stay (HOSPITAL_COMMUNITY): Payer: PPO | Admitting: Occupational Therapy

## 2019-08-08 DIAGNOSIS — I10 Essential (primary) hypertension: Secondary | ICD-10-CM

## 2019-08-08 DIAGNOSIS — K5901 Slow transit constipation: Secondary | ICD-10-CM

## 2019-08-08 LAB — CREATININE, SERUM
Creatinine, Ser: 0.55 mg/dL (ref 0.44–1.00)
GFR calc Af Amer: >60 mL/min (ref >60)
GFR calc non Af Amer: >60 mL/min (ref >60)

## 2019-08-08 MED ORDER — POLYETHYLENE GLYCOL 3350 17 G PO PACK
17.0000 g | PACK | Freq: Every day | ORAL | Status: DC
  Administered 2019-08-08 – 2019-08-22 (×11): 17 g via ORAL
  Filled 2019-08-08 (×18): qty 1

## 2019-08-08 MED ORDER — BACLOFEN 20 MG PO TABS
30.0000 mg | ORAL_TABLET | Freq: Every day | ORAL | Status: DC
  Administered 2019-08-08 – 2019-08-26 (×19): 30 mg via ORAL
  Filled 2019-08-08 (×19): qty 1

## 2019-08-08 NOTE — Progress Notes (Signed)
[facility] PHYSICAL MEDICINE & REHABILITATION PROGRESS NOTE  Subjective/Complaints: Patient seen sitting up in her chair this AM.  She states she slept fairly well overnight. She notes some mild leg cramps overnight and would like to consider trial of increasing night time meds.  She states she is "getting better everyday".  ROS: Denies CP, SOB, N/V/D  Objective: Vital Signs: Blood pressure 130/76, pulse 71, temperature 97.9 F (36.6 C), temperature source Oral, resp. rate 16, height 5' (1.524 m), weight 63.2 kg, SpO2 95 %. No results found. Recent Labs    08/07/19 1231  WBC 32.9*  HGB 13.3  HCT 42.2  PLT 131*   Recent Labs    08/08/19 0611  CREATININE 0.55    Physical Exam: BP 130/76 (BP Location: Right Arm)   Pulse 71   Temp 97.9 F (36.6 C) (Oral)   Resp 16   Ht 5' (1.524 m)   Wt 63.2 kg   SpO2 95%   BMI 27.21 kg/m  Constitutional: No distress . Vital signs reviewed. HENT: Facial bruising with edema, healing.  Eyes: EOMI. No discharge. Cardiovascular: No JVD. Respiratory: Normal effort.  No stridor. GI: Non-distended. Skin: Warm and dry.  Intact. Psych: Normal mood.  Normal behavior. Musc: Bilateral hallux valgus deformities. Neurological: Alert Follows simple commands.   HOH Motor: Right upper extremity/right lower extremity: 5/5 proximal distal Left upper extremity: Shoulder abduction, elbow flexion/extension 4 -/5, handgrip 3+/5 with apraxia, stable Left lower extremity: 4-/5 hip flexion, knee extension, 3/5 ankle dorsiflexion, stable No increase in tone appreciated  Assessment/Plan: 1. Functional deficits secondary to right pontine infarct which require 3+ hours per day of interdisciplinary therapy in a comprehensive inpatient rehab setting.  Physiatrist is providing close team supervision and 24 hour management of active medical problems listed below.  Physiatrist and rehab team continue to assess barriers to discharge/monitor patient progress  toward functional and medical goals  Care Tool:  Bathing    Body parts bathed by patient: Left arm, Chest, Abdomen, Right upper leg, Left upper leg, Face, Front perineal area   Body parts bathed by helper: Buttocks, Right arm, Right lower leg, Left lower leg     Bathing assist Assist Level: Moderate Assistance - Patient 50 - 74%     Upper Body Dressing/Undressing Upper body dressing   What is the patient wearing?: Pull over shirt, Bra    Upper body assist Assist Level: Moderate Assistance - Patient 50 - 74%    Lower Body Dressing/Undressing Lower body dressing      What is the patient wearing?: Pants, Underwear/pull up     Lower body assist Assist for lower body dressing: Maximal Assistance - Patient 25 - 49%     Toileting Toileting    Toileting assist Assist for toileting: Moderate Assistance - Patient 50 - 74%     Transfers Chair/bed transfer  Transfers assist     Chair/bed transfer assist level: Minimal Assistance - Patient > 75%     Locomotion Ambulation   Ambulation assist      Assist level: Moderate Assistance - Patient 50 - 74% Assistive device: Walker-rolling Max distance: 66ft   Walk 10 feet activity   Assist     Assist level: Moderate Assistance - Patient - 50 - 74% Assistive device: Walker-rolling   Walk 50 feet activity   Assist Walk 50 feet with 2 turns activity did not occur: Safety/medical concerns  Assist level: Moderate Assistance - Patient - 50 - 74% Assistive device: Walker-rolling  Walk 150 feet activity   Assist Walk 150 feet activity did not occur: Safety/medical concerns         Walk 10 feet on uneven surface  activity   Assist Walk 10 feet on uneven surfaces activity did not occur: Safety/medical concerns         Wheelchair     Assist Will patient use wheelchair at discharge?: No(tbd)             Wheelchair 50 feet with 2 turns activity    Assist            Wheelchair 150  feet activity     Assist            Medical Problem List and Plan: 1.  Left-sided weakness secondary to small right pontine infarction secondary small vessel disease on 07/30/2019.  Continue CIR 2.  Antithrombotics: -DVT/anticoagulation: Lovenox  Cr. WNL on 11/20             -antiplatelet therapy: Aspirin 81 mg daily and Plavix 75 mg daily x3 weeks then aspirin alone 3. Pain Management: Tylenol as needed  Baclofen nightly started on 11/15, increased to 20 on 11/17, increased to 30 on 11/20 4. Mood: Xanax 0.25 mg daily                         Team to provide emotional support             -antipsychotic agents: N/A 5. Neuropsych: This patient is capable of making decisions on her own behalf. 6. Skin/Wound Care: Routine skin checks 7. Fluids/Electrolytes/Nutrition: Routine in and outs.   8.  History of CLL.  Followed outpatient by Dr. Marko Plume.    Afebrile  WBCs 32.9 on 11/19 9.  Hypertension.  Patient on Lotrel 5-20 mg daily as needed prior to admission.  Controlled on 11/20             Monitor with increased mobility 10.  Thrombocytopenia             Platelets 131 on 11/19 11.  Sleep disturbance:   See #3   Chamomile tea ordered as well  Improving overall 12. Hypoalbuminemia   Supplement initiated on 11/17 13. Slow transit constipation  Bowel meds increased on 11/20  LOS: 7 days A FACE TO FACE EVALUATION WAS PERFORMED  Abigail Moreno Lorie Phenix 08/08/2019, 8:58 AM

## 2019-08-08 NOTE — Progress Notes (Signed)
1610: Patient's Daughter(Malinda) reported to this Probation officer Ms. Corbitt was tearful when she spoke with her on the telephone this afternoon. Cinda Quest is requesting PRN Xanax 0.25 mg to be administered on a routine basis. Malinda reported Ms. Ripoll is forgetful and will not remember to ask for Xanax PRN. Malinda reported Ms. Poplaski has been anxious and depressed since her Spouse of 5 years passed away. Malinda's concerns documented in Patient's chart under Physician Sticky Notes for MD follow-up.  1621: PRN Xanax 0.25 mg administered for Anxiety and tearfulness.

## 2019-08-08 NOTE — Progress Notes (Signed)
Physical Therapy Weekly Progress Note  Patient Details  Name: Abigail Moreno MRN: [id number] Date of Birth: [birth year]  Beginning of progress report period: August 02, 2019 End of progress report period: August 08, 2019  Today's Date: 08/08/2019 PT Individual Time: 0906-1005 and 5885-0277 PT Individual Time Calculation (min): 59 min and 58 min  Patient has met 3 of 3 short term goals. Ms. [name] is progressing well with therapy demonstrating improving activity tolerance, L LE muscle activation and motor control, standing balance, awareness, and motor planning. Patient is able to perform bed mobility with min assist, transfers using RW with min assist, gait up to 84f using RW with min assist, and has initiated stair training with mod assist. Patient continues to demonstrate significant L hemibody paresis, impaired L hemibody proprioceptive awareness, standing balance impairments, and impaired motor planning for functional mobility.  Patient continues to demonstrate the following deficits muscle weakness and muscle joint tightness, decreased cardiorespiratoy endurance, impaired timing and sequencing, abnormal tone and unbalanced muscle activation and decreased sitting balance, decreased standing balance, decreased postural control and decreased balance strategies and therefore will continue to benefit from skilled PT intervention to increase functional independence with mobility.  Patient progressing toward long term goals..  Continue plan of care.  PT Short Term Goals Week 1:  PT Short Term Goal 1 (Week 1): Pt will perform bed<>chair transfer with min assist PT Short Term Goal 1 - Progress (Week 1): Met PT Short Term Goal 2 (Week 1): Pt will ambulate x 50 ft with LRAD and min assist PT Short Term Goal 2 - Progress (Week 1): Met PT Short Term Goal 3 (Week 1): Pt will initiate stair training PT Short Term Goal 3 - Progress (Week 1): Met Week 2:  PT Short Term Goal 1 (Week 2): Pt  will perform transfers using LRAD with CGA PT Short Term Goal 2 (Week 2): Pt will ambulate at least 554fusing LRAD with CGA PT Short Term Goal 3 (Week 2): Pt will ascend/descend 1 step using LRAD with min assist  Skilled Therapeutic Interventions/Progress Updates:  Ambulation/gait training;DME/adaptive equipment instruction;Neuromuscular re-education;Psychosocial support;Stair training;UE/LE Strength taining/ROM;Balance/vestibular training;Discharge planning;Pain management;Skin care/wound management;Therapeutic Activities;UE/LE Coordination activities;Cognitive remediation/compensation;Disease management/prevention;Functional mobility training;Patient/family education;Splinting/orthotics;Therapeutic Exercise;Visual/perceptual remediation/compensation   Session 1: Pt received sitting in w/c and agreeable to therapy session. RN present for medication administration. Donned TED hose, shoes, and L LE AFO max assist.  Transported to/from gym in w/c. Sit<>stands using RW with min assist and multimodal cuing for sequencing of UE placement on/off AD and orthotic. Stand pivot w/c>EOM using RW with min assist for balance and max multimodal cuing for sequencing of AD management and LE stepping. Sit>supine>rolling with min assist for LE management. R sidelying L LE NMR targeting hip/knee flexion using powder board 2x 10reps to fatigue with min assist for increased range and multimodal cuing for improved form/technique. Supine L LE NMR of heel slides targeting hip/knee flexion against minor resistance of gravity and friction 2x8reps to fatigue with min assist for increased range and multimodal cuing for proper form/technique. Supine L hip NMR of hook lying hip abduction against level 1 theraband resistance x 12 reps progressed to adding this movement to bridging while sustaining hip abduction against level 1 theraband resistance 2x12 reps. Supine>sitting with min assist for LE management and trunk upright. Ambulated  ~2565fsing RW targeting L LE NMR with min assist for balance and multimodal cuing for improved L LE foot clearance during swing and knee extension during stance - demonstrates improved  toe clearance with AFO. Transported back to room in w/c and pt left seated in w/c with needs in reach and seat belt alarm on.  Session 2: Pt received sitting in w/c and agreeable to therapy session. Pt's lateral L lower leg noted to have redness likely from where the AFO was - removed AFO and deferred using it this therapy session to avoid increased irritation.  Transported to/from gym in w/c. Pt reports that her daughter's house has 3-4steps with L HR and this is where the pt is planning on staying upon discharge. Donned L LE ACE wrap DF assist. Gait training targeting L LE NMR for 64f using RW with emphasis on increased L LE foot clearance via hip/knee flexion and increased L knee extension during stance phase - continues to demonstrate lack of L foot clearance resulting in decreased B LE step length. Stepped forward up/down 1step leading with R LE on ascent and L LE on descent using B HRs x5 reps with mod assist for lifting and maintaining L hand grasp on handrail - cuing for sequencing and guarding L knee buckle - pt able to place L LE on/off step without assistance. R LE lateral step up/down using B UE support on L HR targeting increased L hip abduction activation - mod assist for balance and guarding L knee. Gait training ~533fusing RW with min assist for balance and continued focus on improved L LE foot clearance during swing and increased L LE step length with pt demonstrating improvement. Repeated sit<>stands to/from EOM with cuing and manual facilitation for increased anterior trunk weight shift followed by hip extension to improve upright standing balance x 8 reps with min assist. Performed L LE NMR targeting stance phase of hip/knee extension with trunk upright while standing with B UE support on RW and stepping R LE  up/down on 6" step - mirror feedback for improved upright posture and min assist for balance with manual facilitation for increased L hip extension - progressed to only using L UE support on RW with continued min assist. Performed another L LE NMR stance phase task of stepping R LE laterally up/down on 6" step targeting increased L hip abductor muscle activation again with min assist for balance while using B UE support on RW. Stand pivot EOM>w/c using RW with min assist for balance and again max cuing for stepping L LE laterally while turning. Transported back to room in w/c and pt pt left seated in w/c with needs in reach and seat belt alarm on.  Therapy Documentation Precautions:  Precautions Precautions: Fall Precaution Comments: L hemiparesis Restrictions Weight Bearing Restrictions: No  Pain:   Session 1: Reports minor R shoulder pain stating she has history of rotator cuff injury - provided rest and repositioning for pain management.   Session 2: Continues to report history of R shoulder pain but denies pain currently.  Therapy/Group: Individual Therapy  CaTawana ScalePT, DPT 08/08/2019, 12:45 PM

## 2019-08-08 NOTE — Plan of Care (Signed)
  Problem: RH BOWEL ELIMINATION Goal: RH STG MANAGE BOWEL W/MEDICATION W/ASSISTANCE Description: STG Manage Bowel with Medication with min Assistance. Outcome: Progressing   Problem: RH SAFETY Goal: RH STG ADHERE TO SAFETY PRECAUTIONS W/ASSISTANCE/DEVICE Description: STG Adhere to Safety Precautions With Cues and reminder Assistance/Device. Outcome: Progressing   Problem: RH COGNITION-NURSING Goal: RH STG ANTICIPATES NEEDS/CALLS FOR ASSIST W/ASSIST/CUES Description: STG Anticipates Needs/Calls for Assist With cues and reminders Assistance/Cues. Outcome: Progressing   Problem: RH PAIN MANAGEMENT Goal: RH STG PAIN MANAGED AT OR BELOW PT'S PAIN GOAL Description: Pain less than 2. Outcome: Progressing

## 2019-08-08 NOTE — Progress Notes (Signed)
Occupational Therapy Weekly Progress Note  Patient Details  Name: Abigail Moreno MRN: [id number] Date of Birth: 06-Aug-1929  Beginning of progress report period: August 02, 2019 End of progress report period: August 08, 2019  Today's Date: 08/08/2019 OT Individual Time: 9702-6378 OT Individual Time Calculation (min): 75 min    Patient has met 2 of 4 short term goals.  Pt is progressing well but due to her prior kyphotic posture and R shoulder RTC injury, and her current L side hemiplegia she continues to need mod A with transfers and UB dressing. She is progressing well with her sit to stand skills and her ability to maintain her static standing with UE support.  She is progressing with some dynamic standing, such as washing her bottom in standing and helping to pull up her pants.   Patient continues to demonstrate the following deficits: muscle weakness, unbalanced muscle activation and decreased coordination and decreased sitting balance, decreased standing balance, decreased postural control, hemiplegia and decreased balance strategies, and hemiplegia, and therefore will continue to benefit from skilled OT intervention to enhance overall performance with BADL and Reduce care partner burden.  Patient progressing toward long term goals..  Continue plan of care.  OT Short Term Goals Week 1:  OT Short Term Goal 1 (Week 1): Pt will don shirt with contact guard without chair support OT Short Term Goal 1 - Progress (Week 1): Progressing toward goal OT Short Term Goal 2 (Week 1): Pt will perform sit to stand with min A consistently for tranfsers and clothing management OT Short Term Goal 2 - Progress (Week 1): Met OT Short Term Goal 3 (Week 1): Pt will performed toilet transfers with min A with LRAD OT Short Term Goal 3 - Progress (Week 1): Progressing toward goal OT Short Term Goal 4 (Week 1): Pt will perform one grooming tasks in standing with contact guard. OT Short Term Goal 4 -  Progress (Week 1): Met Week 2:  OT Short Term Goal 1 (Week 2): Pt will be able to bathe RUE using LUE as an active A. OT Short Term Goal 2 (Week 2): Pt will be able to bathe feet with a long handled sponge. OT Short Term Goal 3 (Week 2): Pt will be able to don pullover shirt sitting on EOB with min A. OT Short Term Goal 4 (Week 2): Using AE as needed, pt will be able to thread pants over feet with min A. OT Short Term Goal 5 (Week 2): Pt will transfer to the toilet with min A.  Skilled Therapeutic Interventions/Progress Updates:    Pt received in w/c ready for a shower. See ADL documentation below for details.  After self care completed,  Saebo stim go applied to L shoulder.  Worked for 30 min on active shoulder flexion and extension using a dowel bar with a/arom.  She continues to only have 20 degrees of sh flex against gravity. Also focused on forearm, wrist and finger AROM followed by resistive grasping and full Active finger extension.   The estim left on shoulder for cyclic estim unattended for 30 min.  Pt tolerated well, no skin redness. Saebo Stim Go 330 pulse width 35 Hz pulse rate On 8 sec/ off 8 sec Ramp up/ down 2 sec Symmetrical Biphasic wave form  Max intensity 184m at 500 Ohm load  Pt resting in wc with all needs met, belt alarm on.   Therapy Documentation Precautions:  Precautions Precautions: Fall Precaution Comments: L hemiparesis Restrictions Weight Bearing  Restrictions: No     Pain: no c/o pain   ADL: ADL Grooming: Setup, Minimal assistance Where Assessed-Grooming: Sitting at sink Upper Body Bathing: Minimal assistance Where Assessed-Upper Body Bathing: Shower Lower Body Bathing: Minimal assistance Where Assessed-Lower Body Bathing: Shower Upper Body Dressing: Moderate assistance Where Assessed-Upper Body Dressing: Sitting at sink, Wheelchair Lower Body Dressing: Maximal assistance Where Assessed-Lower Body Dressing: Sitting at sink,  Wheelchair Toileting: Maximal assistance Where Assessed-Toileting: Glass blower/designer: Moderate assistance Toilet Transfer Method: Stand pivot Science writer: Energy manager: Environmental education officer Method: Education officer, environmental: Grab bars, Transfer tub bench   Therapy/Group: Individual Therapy  Craig Beach 08/08/2019, 12:55 PM

## 2019-08-09 ENCOUNTER — Inpatient Hospital Stay (HOSPITAL_COMMUNITY): Payer: PPO

## 2019-08-09 DIAGNOSIS — F411 Generalized anxiety disorder: Secondary | ICD-10-CM

## 2019-08-09 MED ORDER — ALPRAZOLAM 0.25 MG PO TABS
0.2500 mg | ORAL_TABLET | Freq: Two times a day (BID) | ORAL | Status: DC
  Administered 2019-08-09 – 2019-08-27 (×36): 0.25 mg via ORAL
  Filled 2019-08-09 (×36): qty 1

## 2019-08-09 MED ORDER — ALPRAZOLAM 0.25 MG PO TABS: 0.2500 mg | ORAL_TABLET | Freq: Two times a day (BID) | ORAL | Status: DC | PRN

## 2019-08-09 NOTE — Progress Notes (Signed)
[facility] PHYSICAL MEDICINE & REHABILITATION PROGRESS NOTE  Subjective/Complaints: Pt up in bed. No new complaints other than working through her left sided weakness. RN notes significant anxiety and tearfulness. Daughter reports this has been ongoing since husband passed away.   ROS: Patient denies fever, rash, sore throat, blurred vision, nausea, vomiting, diarrhea, cough, shortness of breath or chest pain, joint or back pain, headache    Objective: Vital Signs: Blood pressure 137/75, pulse 66, temperature 97.7 F (36.5 C), resp. rate 18, height 5' (1.524 m), weight 63.2 kg, SpO2 94 %. No results found. Recent Labs    08/07/19 1231  WBC 32.9*  HGB 13.3  HCT 42.2  PLT 131*   Recent Labs    08/08/19 0611  CREATININE 0.55    Physical Exam: BP 137/75 (BP Location: Right Arm)   Pulse 66   Temp 97.7 F (36.5 C)   Resp 18   Ht 5' (1.524 m)   Wt 63.2 kg   SpO2 94%   BMI 27.21 kg/m  Constitutional: No distress . Vital signs reviewed. HEENT: EOMI, oral membranes moist, facial bruises Neck: supple Cardiovascular: RRR without murmur. No JVD    Respiratory: CTA Bilaterally without wheezes or rales. Normal effort    GI: BS +, non-tender, non-distended  Skin: Warm and dry.  Intact. Psych: did not appear overly anxious. Musc: Bilateral hallux valgus deformities. Neurological: Alert Follows simple commands.   HOH Motor: Right upper extremity/right lower extremity: 5/5 proximal distal Left upper extremity: Shoulder abduction, elbow flexion/extension 4 -/5, handgrip 3+/5 with apraxia, stable Left lower extremity: 4-/5 hip flexion, knee extension, 3/5 ankle dorsiflexion, stable No increase in tone appreciated  Assessment/Plan: 1. Functional deficits secondary to right pontine infarct which require 3+ hours per day of interdisciplinary therapy in a comprehensive inpatient rehab setting.  Physiatrist is providing close team supervision and 24 hour management of active medical  problems listed below.  Physiatrist and rehab team continue to assess barriers to discharge/monitor patient progress toward functional and medical goals  Care Tool:  Bathing    Body parts bathed by patient: Left arm, Chest, Abdomen, Right upper leg, Left upper leg, Face, Front perineal area, Buttocks   Body parts bathed by helper: Right arm, Right lower leg, Left lower leg     Bathing assist Assist Level: Moderate Assistance - Patient 50 - 74%     Upper Body Dressing/Undressing Upper body dressing   What is the patient wearing?: Pull over shirt, Bra    Upper body assist Assist Level: Moderate Assistance - Patient 50 - 74%    Lower Body Dressing/Undressing Lower body dressing      What is the patient wearing?: Pants, Underwear/pull up     Lower body assist Assist for lower body dressing: Maximal Assistance - Patient 25 - 49%     Toileting Toileting    Toileting assist Assist for toileting: Moderate Assistance - Patient 50 - 74%     Transfers Chair/bed transfer  Transfers assist     Chair/bed transfer assist level: Minimal Assistance - Patient > 75% Chair/bed transfer assistive device: Programmer, multimedia   Ambulation assist      Assist level: Minimal Assistance - Patient > 75% Assistive device: Walker-rolling Max distance: 88ft   Walk 10 feet activity   Assist     Assist level: Minimal Assistance - Patient > 75% Assistive device: Walker-rolling   Walk 50 feet activity   Assist Walk 50 feet with 2 turns activity did not  occur: Safety/medical concerns  Assist level: Minimal Assistance - Patient > 75% Assistive device: Walker-rolling    Walk 150 feet activity   Assist Walk 150 feet activity did not occur: Safety/medical concerns         Walk 10 feet on uneven surface  activity   Assist Walk 10 feet on uneven surfaces activity did not occur: Safety/medical concerns         Wheelchair     Assist Will patient use  wheelchair at discharge?: No(tbd)             Wheelchair 50 feet with 2 turns activity    Assist            Wheelchair 150 feet activity     Assist            Medical Problem List and Plan: 1.  Left-sided weakness secondary to small right pontine infarction secondary small vessel disease on 07/30/2019.  Continue CIR PT, OT 2.  Antithrombotics: -DVT/anticoagulation: Lovenox  Cr. WNL on 11/20             -antiplatelet therapy: Aspirin 81 mg daily and Plavix 75 mg daily x3 weeks then aspirin alone 3. Pain Management: Tylenol as needed  Baclofen nightly started on 11/15, increased to 20 on 11/17, increased to 30 on 11/20 4. Mood: 11/21: Xanax 0.25 mg--ordered bid scheduled dosing in addition to q12 prn doses  -may need to start antidepressant  -Would benefit from neuropsych eval                         Team to provide emotional support             -antipsychotic agents: N/A 5. Neuropsych: This patient is capable of making decisions on her own behalf. 6. Skin/Wound Care: Routine skin checks 7. Fluids/Electrolytes/Nutrition: Routine in and outs.   8.  History of CLL.  Followed outpatient by Dr. Marko Plume.    Afebrile  WBCs 32.9 on 11/19 9.  Hypertension.  Patient on Lotrel 5-20 mg daily as needed prior to admission.  Controlled on 11/21             Monitor with increased mobility 10.  Thrombocytopenia             Platelets 131 on 11/19 11.  Sleep disturbance:   See #3,4  Chamomile tea ordered as well  Improving overall 12. Hypoalbuminemia   Supplement initiated on 11/17 13. Slow transit constipation  Bowel meds increased on 11/20---had bm 11/20  LOS: 8 days A FACE TO [place] 08/09/2019, 11:06 AM

## 2019-08-09 NOTE — Progress Notes (Signed)
Physical Therapy Session Note  Patient Details  Name: Abigail Moreno MRN: [id number] Date of Birth: [birth year]  Today's Date: 08/09/2019 PT Individual Time: 3748-2707 PT Individual Time Calculation (min): 33 min   Short Term Goals: Week 1:  PT Short Term Goal 1 (Week 1): Pt will perform bed<>chair transfer with min assist PT Short Term Goal 1 - Progress (Week 1): Met PT Short Term Goal 2 (Week 1): Pt will ambulate x 50 ft with LRAD and min assist PT Short Term Goal 2 - Progress (Week 1): Met PT Short Term Goal 3 (Week 1): Pt will initiate stair training PT Short Term Goal 3 - Progress (Week 1): Met Week 2:  PT Short Term Goal 1 (Week 2): Pt will perform transfers using LRAD with CGA PT Short Term Goal 2 (Week 2): Pt will ambulate at least 28f using LRAD with CGA PT Short Term Goal 3 (Week 2): Pt will ascend/descend 1 step using LRAD with min assist Week 3:     Skilled Therapeutic Interventions/Progress Updates:    PAIN  Denies pain this pm  Pt transported to gym for time efficiency.  STS from wc w/cga and extra time verbal cues for hand placement, assist to stabilize L hand in splint.. Gait 455fw/RW and cga to min assist w/poor clearance LLE. Turn/sit to edge of mat w/verbal cues for safety. Sitting balance w/return to midline using mirror for feedback LLE LAQs  X 10 HS curls using washcloth on floor under L foot x 10  STS w/RW and performed hip abd Add L LE w/cloth under foot and mirror for feeback/posture.  Repeated x 10 w/good attention to posture by pt.  Gait 4041fat to wc w/mod assist for turning, cga to min assist for gait on straight path, improved clearance w/second gait trial following therex.  Pt transported to room and left OOB in wc w/needs in reach and chair alarm set.  Daughter in room and not wearing mask, educated daughter regarding hospital policy for mask at all times due to Covid/fragile patient population.  Daughter donned mask and voiced understanding,  but may need continued ed.   Therapy Documentation Precautions:  Precautions Precautions: Fall Precaution Comments: L hemiparesis Restrictions Weight Bearing Restrictions: No    Therapy/Group: Individual Therapy  BarCallie FieldingT Garvin/21/2020, 3:02 PM

## 2019-08-09 NOTE — Progress Notes (Signed)
Occupational Therapy Note  Patient Details  Name: Abigail Moreno MRN: [id number] Date of Birth: 1929/06/14  Today's Date: 08/09/2019 OT Individual Time: 1200- 44 (unattended estim)     No c/o pain  Pt agreeable to using portable estim unit on her L shoulder. Set unit on pt to intensity pt could feel and tolerate.  Applied at lunch and removed at end of her lunch.  Saebo Stim Go 330 pulse width 35 Hz pulse rate On 8 sec/ off 8 sec Ramp up/ down 2 sec Symmetrical Biphasic wave form  Max intensity 125mA at 500 Ohm load  Tammy Ericsson 08/09/2019, 3:04 PM

## 2019-08-10 ENCOUNTER — Inpatient Hospital Stay (HOSPITAL_COMMUNITY): Payer: PPO | Admitting: Physical Therapy

## 2019-08-10 NOTE — Progress Notes (Signed)
[facility] PHYSICAL MEDICINE & REHABILITATION PROGRESS NOTE  Subjective/Complaints: Patient is up in chair. States she received medication for constipation. Requests two tylenol for her right shoulder pain.   ROS: Patient denies fever, rash, sore throat, blurred vision, nausea, vomiting, diarrhea, cough, shortness of breath or chest pain, joint or back pain, headache    Objective: Vital Signs: Blood pressure (!) 128/54, pulse 73, temperature 97.7 F (36.5 C), resp. rate 18, height 5' (1.524 m), weight 63.2 kg, SpO2 94 %. No results found. Recent Labs    08/07/19 1231  WBC 32.9*  HGB 13.3  HCT 42.2  PLT 131*   Recent Labs    08/08/19 0611  CREATININE 0.55    Physical Exam: BP (!) 128/54 (BP Location: Right Arm)   Pulse 73   Temp 97.7 F (36.5 C)   Resp 18   Ht 5' (1.524 m)   Wt 63.2 kg   SpO2 94%   BMI 27.21 kg/m  Constitutional: No distress . Vital signs reviewed. HEENT: EOMI, oral membranes moist, facial bruises Neck: supple Cardiovascular: RRR without murmur. No JVD    Respiratory: CTA Bilaterally without wheezes or rales. Normal effort    GI: BS +, non-tender, non-distended  Skin: Warm and dry.  Intact. Psych: did not appear overly anxious. Musc: Bilateral hallux valgus deformities. Neurological: Alert Follows simple commands.   HOH Motor: Right upper extremity/right lower extremity: 5/5 proximal distal Left upper extremity: Shoulder abduction, elbow flexion/extension 4 -/5, handgrip 3+/5 with apraxia, stable Left lower extremity: 4-/5 hip flexion, knee extension, 3/5 ankle dorsiflexion, stable No increase in tone appreciated  Assessment/Plan: 1. Functional deficits secondary to right pontine infarct which require 3+ hours per day of interdisciplinary therapy in a comprehensive inpatient rehab setting.  Physiatrist is providing close team supervision and 24 hour management of active medical problems listed below.  Physiatrist and rehab team continue to  assess barriers to discharge/monitor patient progress toward functional and medical goals  Care Tool:  Bathing    Body parts bathed by patient: Left arm, Chest, Abdomen, Right upper leg, Left upper leg, Face, Front perineal area, Buttocks   Body parts bathed by helper: Right arm, Right lower leg, Left lower leg     Bathing assist Assist Level: Moderate Assistance - Patient 50 - 74%     Upper Body Dressing/Undressing Upper body dressing   What is the patient wearing?: Pull over shirt, Bra    Upper body assist Assist Level: Moderate Assistance - Patient 50 - 74%    Lower Body Dressing/Undressing Lower body dressing      What is the patient wearing?: Pants, Underwear/pull up     Lower body assist Assist for lower body dressing: Maximal Assistance - Patient 25 - 49%     Toileting Toileting    Toileting assist Assist for toileting: Moderate Assistance - Patient 50 - 74%     Transfers Chair/bed transfer  Transfers assist     Chair/bed transfer assist level: Moderate Assistance - Patient 50 - 74% Chair/bed transfer assistive device: Programmer, multimedia   Ambulation assist      Assist level: Moderate Assistance - Patient 50 - 74%(min straight path, mod for turning) Assistive device: Walker-rolling Max distance: 40   Walk 10 feet activity   Assist     Assist level: Minimal Assistance - Patient > 75% Assistive device: Walker-rolling   Walk 50 feet activity   Assist Walk 50 feet with 2 turns activity did not occur: Safety/medical concerns  Assist level: Minimal Assistance - Patient > 75% Assistive device: Walker-rolling    Walk 150 feet activity   Assist Walk 150 feet activity did not occur: Safety/medical concerns         Walk 10 feet on uneven surface  activity   Assist Walk 10 feet on uneven surfaces activity did not occur: Safety/medical concerns         Wheelchair     Assist Will patient use wheelchair at  discharge?: No(tbd)             Wheelchair 50 feet with 2 turns activity    Assist            Wheelchair 150 feet activity     Assist            Medical Problem List and Plan: 1.  Left-sided weakness secondary to small right pontine infarction secondary small vessel disease on 07/30/2019.  Continue CIR PT, OT 2.  Antithrombotics: -DVT/anticoagulation: Lovenox  Cr. WNL on 11/20             -antiplatelet therapy: Aspirin 81 mg daily and Plavix 75 mg daily x3 weeks then aspirin alone 3. Pain Management: Tylenol as needed  Baclofen nightly started on 11/15, increased to 20 on 11/17, increased to 30 on 11/20 4. Mood: 11/21: Xanax 0.25 mg--ordered bid scheduled dosing in addition to q12 prn doses  -may need to start antidepressant  -Would benefit from neuropsych eval                         Team to provide emotional support             -antipsychotic agents: N/A 5. Neuropsych: This patient is capable of making decisions on her own behalf. 6. Skin/Wound Care: Routine skin checks 7. Fluids/Electrolytes/Nutrition: Routine in and outs.   8.  History of CLL.  Followed outpatient by Dr. Marko Plume.    Afebrile  WBCs 32.9 on 11/19 9.  Hypertension.  Patient on Lotrel 5-20 mg daily as needed prior to admission.  Controlled on 11/21 and 11/22             Monitor with increased mobility 10.  Thrombocytopenia             Platelets 131 on 11/19 11.  Sleep disturbance:   See #3,4  Chamomile tea ordered as well  Improving overall 12. Hypoalbuminemia   Supplement initiated on 11/17 13. Slow transit constipation  Bowel meds increased on 11/20---had bm 11/20  LOS: 9 days A FACE TO FACE EVALUATION WAS PERFORMED  Martha Clan P Zoie Sarin 08/10/2019, 10:55 AM

## 2019-08-10 NOTE — Plan of Care (Signed)
  Problem: Consults Goal: RH STROKE PATIENT EDUCATION Description: See Patient Education module for education specifics  Outcome: Progressing Goal: Nutrition Consult-if indicated Outcome: Progressing   Problem: RH BOWEL ELIMINATION Goal: RH STG MANAGE BOWEL WITH ASSISTANCE Description: STG Manage Bowel with min Assistance. Outcome: Progressing Goal: RH STG MANAGE BOWEL W/MEDICATION W/ASSISTANCE Description: STG Manage Bowel with Medication with min Assistance. Outcome: Progressing   Problem: RH BLADDER ELIMINATION Goal: RH STG MANAGE BLADDER WITH ASSISTANCE Description: STG Manage Bladder With min Assistance Outcome: Progressing   Problem: RH SKIN INTEGRITY Goal: RH STG MAINTAIN SKIN INTEGRITY WITH ASSISTANCE Description: STG Maintain Skin Integrity With min Assistance. Outcome: Progressing Goal: RH STG ABLE TO PERFORM INCISION/WOUND CARE W/ASSISTANCE Description: STG Able To Perform Incision/Wound Care With min Assistance. Outcome: Progressing   Problem: RH SAFETY Goal: RH STG ADHERE TO SAFETY PRECAUTIONS W/ASSISTANCE/DEVICE Description: STG Adhere to Safety Precautions With Cues and reminder Assistance/Device. Outcome: Progressing   Problem: RH COGNITION-NURSING Goal: RH STG USES MEMORY AIDS/STRATEGIES W/ASSIST TO PROBLEM SOLVE Description: STG Uses Memory Aids/Strategies With cues and reminders Assistance to Problem Solve. Outcome: Progressing Goal: RH STG ANTICIPATES NEEDS/CALLS FOR ASSIST W/ASSIST/CUES Description: STG Anticipates Needs/Calls for Assist With cues and reminders Assistance/Cues. Outcome: Progressing   Problem: RH PAIN MANAGEMENT Goal: RH STG PAIN MANAGED AT OR BELOW PT'S PAIN GOAL Description: Pain less than 2. Outcome: Progressing   Problem: RH KNOWLEDGE DEFICIT Goal: RH STG INCREASE KNOWLEDGE OF STROKE PROPHYLAXIS Description: Pt will be able to demonstrate understanding of adherence to medications to prevent stroke, and follow the DASH diet with  mod I assist using handouts. Outcome: Progressing

## 2019-08-10 NOTE — Progress Notes (Signed)
Physical Therapy Session Note  Patient Details  Name: Abigail Moreno MRN: [id number] Date of Birth: Feb 06, 1929  Today's Date: 08/10/2019 PT Individual Time: 0807-0906 PT Individual Time Calculation (min): 59 min   Short Term Goals: Week 2:  PT Short Term Goal 1 (Week 2): Pt will perform transfers using LRAD with CGA PT Short Term Goal 2 (Week 2): Pt will ambulate at least 106ft using LRAD with CGA PT Short Term Goal 3 (Week 2): Pt will ascend/descend 1 step using LRAD with min assist  Skilled Therapeutic Interventions/Progress Updates:    Pt received supine with HOB maximally elevated and reporting she is feeling very tired this morning but is agreeable to therapy session. Supine>sitting EOB with HOB maximally elevated and relying on bedrails with mod assist for L LE management and trunk upright. Assessed L lateral lower leg with the redness from the AFO no longer present. Pt reporting need to use bathroom. Donned B LE TED hose, pants, and shoes max assist. Sit>stand EOB>RW with mod assist for lifting into standing and max cuing for increased trunk and hip extension for upright posture. Stand pivot EOB>w/c using RW with mod assist for balance and manual facilitation for R/L lateral weight shift and max cuing for sequencing.  Transported to/from bathroom in w/c. Stand pivot w/c>BSC over toilet using grab bar with mod assist for lifting and balance while turning. Standing with UE support on grab bar and min assist for balance performed LB clothing management max assist. Sit<>stand BSC<>grab bars with mod assist for lifting/lowering - pt continent of urine and performed anterior peri-care with set-up assist. Stand pivot BSC over toilet>w/c using grab bars with mod assist for balance and manual facilitation for R/L lateral weight shifting. Transported to sink and performed seated hand hygiene with mod assist.  Transported to/from gym in w/c. Stand pivot w/c>EOM using RW with min assist for balance and  L LE stepping as pe demonstrating increased difficulty with R lateral weight shift and lifting L LE to step this session due to overall fatigue. Sit>supine with min assist for L LE management and trunk descent. Performed R sidelying L LE NMR using powder board focusing on increased L hip/knee flexion then isolated knee flexion/extension with assist for movement into flexion and increased ROM. Supine>sit mod assist for L LE management and trunk upright - max cuing for increased pt independence. Stand pivot EOM>w/c using RW with min assist for balance and again increased facilitation for R lateral weight shift and assist for stepping with L LE. Transported back to room in w/c and pt left seated in w/c with needs in reach and seat belt alarm on.  Therapy Documentation Precautions:  Precautions Precautions: Fall Precaution Comments: L hemiparesis Restrictions Weight Bearing Restrictions: No  Pain:   Reports R shoulder soreness stating it is from hx of rotator cuff injury and also believe it is from increased use - repositioning and education on increasing use of LEs to perform mobility tasks to decreased R UE compensation.   Therapy/Group: Individual Therapy  Tawana Scale, PT, DPT 08/10/2019, 7:49 AM

## 2019-08-11 ENCOUNTER — Inpatient Hospital Stay (HOSPITAL_COMMUNITY): Payer: PPO | Admitting: Occupational Therapy

## 2019-08-11 ENCOUNTER — Inpatient Hospital Stay (HOSPITAL_COMMUNITY): Payer: PPO

## 2019-08-11 MED ORDER — ESCITALOPRAM OXALATE 10 MG PO TABS
5.0000 mg | ORAL_TABLET | Freq: Every day | ORAL | Status: DC
  Administered 2019-08-11 – 2019-08-27 (×17): 5 mg via ORAL
  Filled 2019-08-11 (×17): qty 1

## 2019-08-11 NOTE — Progress Notes (Signed)
Occupational Therapy Session Note  Patient Details  Name: Abigail Moreno MRN: [id number] Date of Birth: 1928-11-13  Today's Date: 08/11/2019 OT Individual Time: 5277-8242 OT Individual Time Calculation (min): 75 min    Short Term Goals: Week 1:  OT Short Term Goal 1 (Week 1): Pt will don shirt with contact guard without chair support OT Short Term Goal 1 - Progress (Week 1): Progressing toward goal OT Short Term Goal 2 (Week 1): Pt will perform sit to stand with min A consistently for tranfsers and clothing management OT Short Term Goal 2 - Progress (Week 1): Met OT Short Term Goal 3 (Week 1): Pt will performed toilet transfers with min A with LRAD OT Short Term Goal 3 - Progress (Week 1): Progressing toward goal OT Short Term Goal 4 (Week 1): Pt will perform one grooming tasks in standing with contact guard. OT Short Term Goal 4 - Progress (Week 1): Met Week 2:  OT Short Term Goal 1 (Week 2): Pt will be able to bathe RUE using LUE as an active A. OT Short Term Goal 2 (Week 2): Pt will be able to bathe feet with a long handled sponge. OT Short Term Goal 3 (Week 2): Pt will be able to don pullover shirt sitting on EOB with min A. OT Short Term Goal 4 (Week 2): Using AE as needed, pt will be able to thread pants over feet with min A. OT Short Term Goal 5 (Week 2): Pt will transfer to the toilet with min A.  Skilled Therapeutic Interventions/Progress Updates:    Pt received in w/c ready for therapy. She requested a shower.  Pt continues to need min -mod A with sit to stands, mod A with squat pivot transfers, and min -mod with static stand.  She completed squat pivot w/c >< tub bench with use of bar.  Pt stood in shower with bar but unable to safely release hand to wash bottom as she tends to rock back on her heels and not able to actively extend LLE.   Pt used a long sponge to wash feet and her R arm.   Replaced seat cushion to a thinner cushion and placed a seat back to help with her  kyphotic posture.   Pt continues to struggle with UB dressing due to limited R shoulder ROM from RTC injury and kyphotic posture.   After dressing, applied Saebo stim unit to L shoulder for FES.  Pt worked on R shoulder a/arom for flexion and resisted shoulder extension.  Pt then set up for self AROM on a w/c laptray.  estim left on for 45 minutes unattended.  Removed from arm with no adverse skin reaction.  Pt tolerated it well.   Saebo Stim Go 330 pulse width 35 Hz pulse rate On 8 sec/ off 8 sec Ramp up/ down 2 sec Symmetrical Biphasic wave form  Max intensity 162m at 500 Ohm load   Pt resting in wc with belt alarm on and all needs met.    Therapy Documentation Precautions:  Precautions Precautions: Fall Precaution Comments: L hemiparesis Restrictions Weight Bearing Restrictions: No   Pain: Pain Assessment Pain Scale: 0-10 Pain Score: 2  Pain Type: Chronic pain Pain Location: Shoulder Pain Orientation: Right Pain Descriptors / Indicators: Discomfort Pain Frequency: Intermittent Pain Onset: On-going Pain Intervention(s): Medication (See eMAR) ADL: ADL Grooming: Setup, Minimal assistance Where Assessed-Grooming: Sitting at sink Upper Body Bathing: Minimal assistance Where Assessed-Upper Body Bathing: Shower Lower Body Bathing: Minimal assistance Where Assessed-Lower  Body Bathing: Shower Upper Body Dressing: Moderate assistance Where Assessed-Upper Body Dressing: Sitting at sink, Wheelchair Lower Body Dressing: Maximal assistance Where Assessed-Lower Body Dressing: Sitting at sink, Wheelchair Toileting: Maximal assistance Where Assessed-Toileting: Glass blower/designer: Moderate assistance Toilet Transfer Method: Stand pivot Science writer: Energy manager: Environmental education officer Method: Education officer, environmental: Grab bars, Transfer tub bench   Therapy/Group: Individual  Therapy  Williams 08/11/2019, 12:10 PM

## 2019-08-11 NOTE — Progress Notes (Signed)
Occupational Therapy Session Note  Patient Details  Name: Abigail Moreno MRN: [id number] Date of Birth: 05/01/1929  Today's Date: 08/11/2019 OT Individual Time: [id number] OT Individual Time Calculation (min): 77 min    Short Term Goals: Week 2:  OT Short Term Goal 1 (Week 2): Pt will be able to bathe RUE using LUE as an active A. OT Short Term Goal 2 (Week 2): Pt will be able to bathe feet with a long handled sponge. OT Short Term Goal 3 (Week 2): Pt will be able to don pullover shirt sitting on EOB with min A. OT Short Term Goal 4 (Week 2): Using AE as needed, pt will be able to thread pants over feet with min A. OT Short Term Goal 5 (Week 2): Pt will transfer to the toilet with min A.  Skilled Therapeutic Interventions/Progress Updates:    Pt worked on Brewing technologist for her trunk and LUE in the gym during session.  She completed mod assist stand pivot transfer from the wheelchair to the therapy mat with use of the RW and adaptive hand splint on the left side.  She was able to work on reciprical scooting on the left side with overall mod assist, with incorporation of the LUE.  Also had her use the LUE to push a tilted stool to target for several sets of 5 repetitions.  She needed min facilitation at times to maintain upright posture and to avoid flexing trunk when attempting to fully extend the left elbow.  She was also able to work on picking up 2"x4" blocks with the LUE from her left side on the mat and place in a box at knee level in front of her.  Min facilitation needed to complete task with decreased thumb extension noted actively when attempting to position hand around the block.  Finished session with return to the wheelchair with mod assist and mod demonstrational cueing for sequencing walker and stepping.  Returned to the room with pt left sitting up in the wheelchair with call button and phone in reach and safety belt in place.    Therapy Documentation Precautions:   Precautions Precautions: Fall Precaution Comments: L hemiparesis Restrictions Weight Bearing Restrictions: No   Vital Signs: Therapy Vitals Temp: 98.4 F (36.9 C) Temp Source: Oral Pulse Rate: 79 Resp: 18 BP: 109/74 Patient Position (if appropriate): Sitting Pain: Pain Assessment Pain Scale: Faces Faces Pain Scale: Hurts a little bit Pain Type: Acute pain Pain Location: Hand Pain Orientation: Left Pain Descriptors / Indicators: Discomfort Pain Onset: With Activity Pain Intervention(s): Repositioned ADL: See Care Tool Section for some details of mobility and selfcare  Therapy/Group: Individual Therapy  Giavonna Pflum OTR/L 08/11/2019, 3:46 PM

## 2019-08-11 NOTE — Progress Notes (Signed)
[facility] PHYSICAL MEDICINE & REHABILITATION PROGRESS NOTE  Subjective/Complaints: Patient seen sitting up in bed this morning.  She states she slept well overnight.  She states she had a good weekend.  She notes minimal cramps.  She has questions regarding progress.  ROS: Denies CP, SOB, N/V/D  Objective: Vital Signs: Blood pressure 113/88, pulse (!) 55, temperature 98.7 F (37.1 C), resp. rate 18, height 5' (1.524 m), weight 63.2 kg, SpO2 93 %. No results found. No results for input(s): WBC, HGB, HCT, PLT in the last 72 hours. No results for input(s): NA, K, CL, CO2, GLUCOSE, BUN, CREATININE, CALCIUM in the last 72 hours.  Physical Exam: BP 113/88 (BP Location: Right Arm)   Pulse (!) 55   Temp 98.7 F (37.1 C)   Resp 18   Ht 5' (1.524 m)   Wt 63.2 kg   SpO2 93%   BMI 27.21 kg/m  Constitutional: No distress . Vital signs reviewed. HENT: Normocephalic.  Atraumatic. Eyes: EOMI. No discharge. Cardiovascular: No JVD. Respiratory: Normal effort.  No stridor. GI: Non-distended. Skin: Warm and dry.  Intact. Psych: Normal mood.  Normal behavior. Musc: Bilateral hallux valgus deformities  Neurological: Alert  Follows simple commands.   HOH Motor: Right upper extremity/right lower extremity: 5/5 proximal distal Left upper extremity: Shoulder abduction, elbow flexion/extension 4 -/5, handgrip 3+-4 -/5 with apraxia Left lower extremity: 4-/5 hip flexion, knee extension, 3 -/5 ankle dorsiflexion No increase in tone appreciated  Assessment/Plan: 1. Functional deficits secondary to right pontine infarct which require 3+ hours per day of interdisciplinary therapy in a comprehensive inpatient rehab setting.  Physiatrist is providing close team supervision and 24 hour management of active medical problems listed below.  Physiatrist and rehab team continue to assess barriers to discharge/monitor patient progress toward functional and medical goals  Care Tool:  Bathing    Body  parts bathed by patient: Left arm, Chest, Abdomen, Right upper leg, Left upper leg, Face, Front perineal area, Buttocks   Body parts bathed by helper: Right arm, Right lower leg, Left lower leg     Bathing assist Assist Level: Moderate Assistance - Patient 50 - 74%     Upper Body Dressing/Undressing Upper body dressing   What is the patient wearing?: Pull over shirt, Bra    Upper body assist Assist Level: Moderate Assistance - Patient 50 - 74%    Lower Body Dressing/Undressing Lower body dressing      What is the patient wearing?: Pants, Underwear/pull up     Lower body assist Assist for lower body dressing: Maximal Assistance - Patient 25 - 49%     Toileting Toileting    Toileting assist Assist for toileting: Moderate Assistance - Patient 50 - 74%     Transfers Chair/bed transfer  Transfers assist     Chair/bed transfer assist level: Moderate Assistance - Patient 50 - 74% Chair/bed transfer assistive device: Programmer, multimedia   Ambulation assist      Assist level: Moderate Assistance - Patient 50 - 74%(min straight path, mod for turning) Assistive device: Walker-rolling Max distance: 40   Walk 10 feet activity   Assist     Assist level: Minimal Assistance - Patient > 75% Assistive device: Walker-rolling   Walk 50 feet activity   Assist Walk 50 feet with 2 turns activity did not occur: Safety/medical concerns  Assist level: Minimal Assistance - Patient > 75% Assistive device: Walker-rolling    Walk 150 feet activity   Assist Walk 150 feet  activity did not occur: Safety/medical concerns         Walk 10 feet on uneven surface  activity   Assist Walk 10 feet on uneven surfaces activity did not occur: Safety/medical concerns         Wheelchair     Assist Will patient use wheelchair at discharge?: No(tbd)             Wheelchair 50 feet with 2 turns activity    Assist            Wheelchair 150 feet  activity     Assist            Medical Problem List and Plan: 1.  Left-sided weakness secondary to small right pontine infarction secondary small vessel disease on 07/30/2019.  Continue CIR 2.  Antithrombotics: -DVT/anticoagulation: Lovenox  Cr. WNL on 11/20             -antiplatelet therapy: Aspirin 81 mg daily and Plavix 75 mg daily x3 weeks then aspirin alone 3. Pain Management: Tylenol as needed  Baclofen nightly started on 11/15, increased to 20 on 11/17, increased to 30 on 11/20 with improvement 4. Mood: Xanax 0.25 mg bid scheduled, in addition to q12 prn doses  Lexapro 5 daily started on 11/23  Team to provide emotional support             -antipsychotic agents: N/A 5. Neuropsych: This patient is capable of making decisions on her own behalf. 6. Skin/Wound Care: Routine skin checks 7. Fluids/Electrolytes/Nutrition: Routine in and outs.    BMP ordered for tomorrow 8.  History of CLL.  Followed outpatient by Dr. Marko Plume.    Afebrile  WBCs 32.9 on 11/19 9.  Hypertension.  Patient on Lotrel 5-20 mg daily as needed prior to admission.  Controlled on 11/23             Monitor with increased mobility 10.  Thrombocytopenia             Platelets 131 on 11/19 11.  Sleep disturbance:   See #3,4  Chamomile tea ordered as well  Improving 12. Hypoalbuminemia   Supplement initiated on 11/17 13. Slow transit constipation  Bowel meds increased on 11/20  Overall improving  LOS: 10 days A FACE TO FACE EVALUATION WAS PERFORMED  Karina Nofsinger Lorie Phenix 08/11/2019, 8:41 AM

## 2019-08-11 NOTE — Progress Notes (Signed)
Physical Therapy Session Note  Patient Details  Name: Abigail Moreno MRN: [id number] Date of Birth: 1929-09-02  Today's Date: 08/11/2019 PT Individual Time: [id number] PT Individual Time Calculation (min): 45 min   Short Term Goals: Week 2:  PT Short Term Goal 1 (Week 2): Pt will perform transfers using LRAD with CGA PT Short Term Goal 2 (Week 2): Pt will ambulate at least 80ft using LRAD with CGA PT Short Term Goal 3 (Week 2): Pt will ascend/descend 1 step using LRAD with min assist  Skilled Therapeutic Interventions/Progress Updates:    Pt seated in w/c upon PT arrival, agreeable to therapy tx and reports R shoulder pain from old rotator cuff injury 2/10, repositioning for pain relief. Pt transported to the gym for time management and energy conservation. This session therapist donned toe up brace to trial with ambulation to help with L foot clearance. Pt ambulated 2 x 10 ft this session with RW and mod assist, facilitation for R lateral weightshift during L swing, facilitation for L hip flexor activation during swing with cues for increased step length/foot clearance. Pt performed sit<>stands this session with RW and min assist, cues for techniques and hand placement on RW. In standing pt continued to work on L hip flexor activation/strenght and R lateral weightshifting while performed x 10 toe taps on 2 inch step with L LE (active assist) and to perform x 7 kicks while kicking cups. Pt reports having to use the bathroom, transported back to room in w/c. Pt performed stand pivot w/c<>toilet with grab bar for UE support and min assist, cues for foot placement, min assist for standing balance while performing clothing management and pericare, continent of bowel and bladder. Pt left in w/c at end of session with needs in reach and chair alarm set.   Therapy Documentation Precautions:  Precautions Precautions: Fall Precaution Comments: L hemiparesis Restrictions Weight Bearing Restrictions:  No   Therapy/Group: Individual Therapy  Netta Corrigan, PT, DPT, CSRS 08/11/2019, 12:00 PM

## 2019-08-11 NOTE — Progress Notes (Signed)
Orthopedic Tech Progress Note Patient Details:  Abigail Moreno [birth year] [id number]  Ortho Devices Type of Ortho Device: Prafo boot/shoe Ortho Device/Splint Location: left Ortho Device/Splint Interventions: Application   Post Interventions Patient Tolerated: Well Instructions Provided: Care of device   [name] 08/11/2019, 3:28 PM

## 2019-08-12 ENCOUNTER — Inpatient Hospital Stay (HOSPITAL_COMMUNITY): Payer: PPO | Admitting: Physical Therapy

## 2019-08-12 ENCOUNTER — Encounter (HOSPITAL_COMMUNITY): Payer: PPO | Admitting: Psychology

## 2019-08-12 ENCOUNTER — Inpatient Hospital Stay (HOSPITAL_COMMUNITY): Payer: PPO | Admitting: Occupational Therapy

## 2019-08-12 LAB — BASIC METABOLIC PANEL
Anion gap: 6 (ref 5–15)
BUN: 22 mg/dL (ref 8–23)
CO2: 29 mmol/L (ref 22–32)
Calcium: 8.8 mg/dL — ABNORMAL LOW (ref 8.9–10.3)
Chloride: 105 mmol/L (ref 98–111)
Creatinine, Ser: 0.56 mg/dL (ref 0.44–1.00)
GFR calc Af Amer: >60 mL/min (ref >60)
GFR calc non Af Amer: >60 mL/min (ref >60)
Glucose, Bld: 94 mg/dL (ref 70–99)
Potassium: 4.2 mmol/L (ref 3.5–5.1)
Sodium: 140 mmol/L (ref 135–145)

## 2019-08-12 NOTE — Plan of Care (Signed)
  Problem: Consults Goal: RH STROKE PATIENT EDUCATION Description: See Patient Education module for education specifics  Outcome: Progressing Goal: Nutrition Consult-if indicated Outcome: Progressing   Problem: RH BOWEL ELIMINATION Goal: RH STG MANAGE BOWEL WITH ASSISTANCE Description: STG Manage Bowel with min Assistance. Outcome: Progressing Goal: RH STG MANAGE BOWEL W/MEDICATION W/ASSISTANCE Description: STG Manage Bowel with Medication with min Assistance. Outcome: Progressing   Problem: RH BLADDER ELIMINATION Goal: RH STG MANAGE BLADDER WITH ASSISTANCE Description: STG Manage Bladder With min Assistance Outcome: Progressing   Problem: RH SKIN INTEGRITY Goal: RH STG MAINTAIN SKIN INTEGRITY WITH ASSISTANCE Description: STG Maintain Skin Integrity With min Assistance. Outcome: Progressing Goal: RH STG ABLE TO PERFORM INCISION/WOUND CARE W/ASSISTANCE Description: STG Able To Perform Incision/Wound Care With min Assistance. Outcome: Progressing   Problem: RH SAFETY Goal: RH STG ADHERE TO SAFETY PRECAUTIONS W/ASSISTANCE/DEVICE Description: STG Adhere to Safety Precautions With Cues and reminder Assistance/Device. Outcome: Progressing   Problem: RH COGNITION-NURSING Goal: RH STG USES MEMORY AIDS/STRATEGIES W/ASSIST TO PROBLEM SOLVE Description: STG Uses Memory Aids/Strategies With cues and reminders Assistance to Problem Solve. Outcome: Progressing Goal: RH STG ANTICIPATES NEEDS/CALLS FOR ASSIST W/ASSIST/CUES Description: STG Anticipates Needs/Calls for Assist With cues and reminders Assistance/Cues. Outcome: Progressing   Problem: RH PAIN MANAGEMENT Goal: RH STG PAIN MANAGED AT OR BELOW PT'S PAIN GOAL Description: Pain less than 2. Outcome: Progressing   Problem: RH KNOWLEDGE DEFICIT Goal: RH STG INCREASE KNOWLEDGE OF STROKE PROPHYLAXIS Description: Pt will be able to demonstrate understanding of adherence to medications to prevent stroke, and follow the DASH diet with  mod I assist using handouts. Outcome: Progressing

## 2019-08-12 NOTE — Progress Notes (Signed)
Physical Therapy Session Note  Patient Details  Name: Abigail Moreno MRN: [id number] Date of Birth: 23-Nov-1928  Today's Date: 08/12/2019 PT Individual Time: 0904-1006 and 1530-1650 PT Individual Time Calculation (min): 62 min and 80 min  Short Term Goals: Week 2:  PT Short Term Goal 1 (Week 2): Pt will perform transfers using LRAD with CGA PT Short Term Goal 2 (Week 2): Pt will ambulate at least 5ft using LRAD with CGA PT Short Term Goal 3 (Week 2): Pt will ascend/descend 1 step using LRAD with min assist  Skilled Therapeutic Interventions/Progress Updates:    Session 1: Pt received sitting in w/c and agreeable to therapy session. Donned B shoes and L toe up brace max assist for time management.  Transported to/from gym in w/c. Ambulated 24ft using RW with min assist for balance - manual facilitation for increased R lateral weight shifting, multimodal cuing for increased L foot clearance and step length as pt has toe drag resulting in ankle inversion and towards end of walk pt required assistance for L LE step. When turning to sit on mat to pt's L she required max multimodal cuing for sequencing of AD management and LE stepping with assist for increased L lateral step and min assist for balance. Donned shoe cap for decreased friction with L toe drag. Performed lateral side stepping at EOM using RW with min assist for balance due to intermittent posterior lean - cuing for increased hip extension and proper lateral stepping with LEs - pt required min assist for L LE stepping in each direction. L stand pivot EOM>w/c using RW with min assist for balance and max multimodal cuing for L LE lateral stepping while turning with pt requiring significantly increased time to move L leg. Sitting>tall kneeling on mat table with B UE support on bench and mirror feedback with mod assist. While in tall kneeling with min/mod assist and max manual facilitation and multimodal cuing for increased hip extension for  upright posture pt able to remove R UE support - attempted progression towards repeated hip flexion/extension to increase glute muscle activation but pt reports this causes L knee discomfort and deferred continuing - transition to attempting R LE knee in/out with mod assist for balance but pt unable to maintain L hip extension to perform this activity and reports L knee discomfort again. Tall kneeling>sitting in w/c with max assist. Stand pivot w/c>EOM using RW with min assist for balance and again max multimodal cuing for increased L lateral step when turning. Sit>supine with mod assist for trunk descent and B LE management - max multimodal cuing for sequencing of reverse logroll for increase pt independence and participation. Supine bridging with L LE bias 2x15 reps with cuing for neutral hip alignment to prevent knee from falling in/out and supine L heel slides with intermittent assist x15 reps. Supine>sit with mod assist for B LE management and trunk upright. R stand pivot EOM>w/c using RW with min assist for balance. Transported back to room and left seated in w/c with needs in reach and seat belt alarm on.  Session 2: Pt received sitting in w/c and agreeable to therapy session. Pt wearing toe up brace throughout session. Transported to/from gym in w/c. Sit<>stands with heavier min assist for lifting into standing as pt demonstrates lack of adequate anterior weight shift often pushing back into the chair - cuing for increased anterior weight shift, proper UE placement on/off AD, and not pushing backs of legs into chair. Standing with B UE support on  RW and min assist for balance +2 assist for donning litegait harness. While in litegait stepped up/down on/off treadmill with B UE support on litegait and mod assist for weight shifting and max cuing for sequencing.  Gait training and L LE NMR on treadmill while in litegait harness: 1st trial: ambulated 4min48seconds at 0.31mph totaling 13ft using black bungy  cords for increased hip forward movement - max manual facilitation for L LE swing phase of gait for foot clearance and step length, external target for increased R LE step length, and 2nd person providing manual facilitation for R/L lateral weight shifting onto stance limb 2nd trial: ambulated 38min44 seconds at 0.10mph totaling 157ft - max manual facilitation for L LE swing phase for foot clearance, heel strike, and step length; external target for increased R LE step length; and 2nd person present for R/L lateral weight shifting manual facilitation onto stance limb with pt demonstrating significant improvement in gait mechanics at this slower speed with pt having longer stance phase time on each LE as well as improved L foot toe off prior to swing Standing with B UE support on litegait doffed harness with +2 assist. Gait training overground 81ft using RW with toe cap on L LE and min assist for balance with max multimodal cuing for increased L LE step length and foot clearance, manual facilitation for R lateral weight shifting onto stance limb, and improved AD management. Pt reports she was incontinent of her bladder. Transported back to room in w/c. Stand pivot w/c>toilet using grab bars with min/mod assist for balance. Standing without UE support with mod assist for balance performed LB clothing management with mod assist and significantly increased time. Sit<>stand toilet<>grab bars with min assist. Pt continent of urine and performed seated peri-care with set-up assist. Transported to sink in w/c and performed seated hand hygiene with set-up assist. Transported back to main therapy gym in w/c. Ascended/descended 4 steps using B UE support on L HR and lateral step-to pattern - mod progressed to max assist for lifting up onto R leg and bringing L LE onto the step during ascent - mod assist on descent for increased L LE abduction. Transported back to room in w/c and pt left seated in w/c with needs in reach and  seat belt alarm on.  Therapy Documentation Precautions:  Precautions Precautions: Fall Precaution Comments: L hemiparesis Restrictions Weight Bearing Restrictions: No  Pain: Session 1: Reports R shoulder pain due to the rotator cuff 2/10 - allowed rest breaks and cuing throughout to decrease use of that extremity as compensation for L hemibody paresis.   Session 2: When walking overground pt reports that her R shoulder was starting to become "sore"- cuing for increased use of B LEs to decrease weightbearing through R arm on RW - allowed rest break for pain management.   Therapy/Group: Individual Therapy  Tawana Scale, PT, DPT 08/12/2019, 8:04 AM

## 2019-08-12 NOTE — Progress Notes (Addendum)
Occupational Therapy Session Note  Patient Details  Name: Abigail Moreno MRN: [id number] Date of Birth: [birth year]  Today's Date: 08/12/2019 OT Individual Time: [id number] OT Individual Time Calculation (min): 57 min    Short Term Goals: Week 2:  OT Short Term Goal 1 (Week 2): Pt will be able to bathe RUE using LUE as an active A. OT Short Term Goal 2 (Week 2): Pt will be able to bathe feet with a long handled sponge. OT Short Term Goal 3 (Week 2): Pt will be able to don pullover shirt sitting on EOB with min A. OT Short Term Goal 4 (Week 2): Using AE as needed, pt will be able to thread pants over feet with min A. OT Short Term Goal 5 (Week 2): Pt will transfer to the toilet with min A.  Skilled Therapeutic Interventions/Progress Updates:    Pt completed shower and dressing during session.  She was able to ambulate from the wheelchair at bedside to the shower seat with mod assist using the RW for support.  Increased difficulty noted with advancement of the LLE with pt also wearing toe up brace.  Once on the shower bench, she needed overall max assist to remove dirty clothing with mod assist to turn around on the seat and scoot further in.  Next, she completed bathing with overall min assist and use of the LH sponge for washing her lower legs and feet.  The therapist assisted with washing and rinsing the peri area in standing.  She then transferred over to the wheelchair and was positioned in front of the sink for dressing.  Increased difficulty with threading the LLE in her underpants and pants but she was more successful with the right.  Mod assist was needed for sit to stand at the sink with max assist for pulling garments over her hips.  She was able to donn the left shoe over her foot but needed max assist for the left as well as for tying both.  Finished session with completion of oral hygiene with setup.  She was left in the wheelchair with call button and phone in reach and safety belt  in place.  NMES was applied to the left shoulder for stimulation of the UE.  She tolerated 60 mins of unattended stimulation without any adverse reactions.    Saebo Stim Go 330 pulse width 35 Hz pulse rate On 8 sec/ off 8 sec Ramp up/ down 2 sec Symmetrical Biphasic wave form  Max intensity 131mA at 500 Ohm load  Therapy Documentation Precautions:  Precautions Precautions: Fall Precaution Comments: L hemiparesis Restrictions Weight Bearing Restrictions: No  Pain:   No report of pain  ADL: See Care Tool Section for some details of mobility and selfcare  Therapy/Group: Individual Therapy  Natasha Paulson OTR/L 08/12/2019, 3:51 PM

## 2019-08-12 NOTE — Consult Note (Signed)
Neuropsychological Consultation   Patient:   Abigail Moreno   DOB:   [birth year]  MR Number:  [id number]  Location:  [facility] [id number] [place] [address] Dept: [place]: [phone]           Date of Service:   08/12/2019  Start Time:   2 PM End Time:   3 PM  Provider/Observer:  Ilean Skill, Psy.D.       Clinical Neuropsychologist       Billing Code/Service: [id number]  Chief Complaint:    Abigail Moreno is an 85 year old right-handed female with a history of CLL, hypertension as well as hyperlipidemia.  The patient presented on 07/30/2019 with left-sided weakness and fall after syncopal episode landing on her face on a fireplace hearth.  Cranial CT was unremarkable for acute intracranial abnormalities.  There was soft tissue contusion on the forehead without underlying fracture.  MRI of brain showed small acute infarct of superior right pons.  Patient admitted for CIR.  Patient has had worry about being a burden upon daughter, who will be taking care of patient upon discharge.    Reason for Service:  Patient was referred for neuropsychological consultation due to coping and adjustment/worry issues.  Below is the HPI for current admission.  HPI: Abigail Moreno is an 85 year old right-handed female with history of CLL under observation followed by Dr. Marko Plume, hypertension as well as hyperlipidemia.  Per chart review and patient, patient lives alone and was independent prior to admission.  Her daughter lives local and can provide assistance as needed.  She presented 07/30/2019 with left-sided weakness and fall after syncopal episode landing on her face.  Cranial CT unremarkable for acute intracranial abnormalities.  There was a soft tissue contusion on the forehead without underlying fracture.  CT maxillofacial negative for fracture.  Patient did not receive TPA.  CT  angiogram of head and neck with no emergent large vessel occlusion or stenosis.  MRI of the brain showed small acute infarct of the superior right pons.  Echocardiogram with ejection fraction of 70% without emboli.  Neurology recommended aspirin and Plavix x3 weeks, then aspirin alone.  Subcutaneous Lovenox for DVT prophylaxis.  Tolerating a regular diet.  Therapy evaluations completed and patient was admitted for a comprehensive rehab program.  Please see preadmission assessment from earlier today as well.  Current Status:  The patient was oriented but with anxiety and worry about her loss of left sided motor functions.  Is improving with left leg and can now grasp left hand.  Worried about not being able to live independently like before stroke.  Worried about daughter's life being disrupted.    Behavioral Observation: Abigail Moreno  presents as a 85 y.o.-year-old Right Caucasian Female who appeared her stated age. her dress was Appropriate and she was Well Groomed and her manners were Appropriate to the situation.  her participation was indicative of Appropriate and Redirectable behaviors.  There were any physical disabilities noted.  she displayed an appropriate level of cooperation and motivation.     Interactions:    Active Appropriate and Redirectable  Attention:   abnormal and attention span appeared shorter than expected for age  Memory:   within normal limits; recent and remote memory intact  Visuo-spatial:  not examined  Speech (Volume):  low  Speech:   normal; normal  Thought Process:  Coherent and Relevant  Though Content:  WNL; not  suicidal and not homicidal  Orientation:   person, place, time/date and situation  Judgment:   Fair  Planning:   Poor  Affect:    Anxious  Mood:    Anxious  Insight:   Good  Intelligence:   normal  Medical History:   Past Medical History:  Diagnosis Date  . Anxiety   . Arthritis of knee  FALL 2012   RIGHT KNEE, hips, and  fingers  . Breast cancer ([facility]) FEBRUARY 2004   T1, NO, ER/PR POSITIVE LEFT BREAST  . CLL (chronic lymphocytic leukemia) ([facility]) 2004   Dr. Marko Plume is oncologist  . Dyspnea    occ sob with exertion  . History of blood transfusion    with first and maybe second delivery  . Hyperlipemia   . Hypertension   . Macular degeneration    both eyes  . Osteoporosis    borderline  . UTI (urinary tract infection)    on nitrofuratonin bid x 7 days started 09-12-18 am    Psychiatric History:  Patient has history of anxiety prior and symptoms continue.  Family Med/Psych History:  Family History  Problem Relation Age of Onset  . Pancreatic cancer Maternal Grandmother   . Cancer Paternal Grandmother        ? type   . Cancer Other        breast-1st paternal cousin-no treatment, liver cancer-2nd maternal cousin  . Hypertension Mother   . Hypertension Sister        x2  . High Cholesterol Sister        x2  . Heart attack Father     Impression/DX:  Abigail Moreno is an 85 year old right-handed female with a history of CLL, hypertension as well as hyperlipidemia.  The patient presented on 07/30/2019 with left-sided weakness and fall after syncopal episode landing on her face on a fireplace hearth.  Cranial CT was unremarkable for acute intracranial abnormalities.  There was soft tissue contusion on the forehead without underlying fracture.  MRI of brain showed small acute infarct of superior right pons.  Patient admitted for CIR.  Patient has had worry about being a burden upon daughter, who will be taking care of patient upon discharge.   The patient was oriented but with anxiety and worry about her loss of left sided motor functions.  Is improving with left leg and can now grasp left hand.  Worried about not being able to live independently like before stroke.  Worried about daughter's life being disrupted.    Disposition/Plan:  Worked on issues of anxiety and worry.  Will follow up with patient  next week.  Diagnosis:    Right pontine CVA [facility]) - Plan: Ambulatory referral to Neurology         Electronically Signed   _______________________ Ilean Skill, Psy.D.

## 2019-08-12 NOTE — Progress Notes (Signed)
[place] PHYSICAL MEDICINE & REHABILITATION PROGRESS NOTE  Subjective/Complaints: Patient seen sitting up in bed this morning.  She states she slept well overnight.  She states that she had some mild left shoulder cramps overnight, but because minimal discomfort.  ROS: Denies CP, SOB, N/V/D  Objective: Vital Signs: Blood pressure 140/76, pulse 63, temperature 97.8 F (36.6 C), resp. rate 16, height 5' (1.524 m), weight 63.2 kg, SpO2 93 %. No results found. No results for input(s): WBC, HGB, HCT, PLT in the last 72 hours. Recent Labs    08/12/19 0500  NA 140  K 4.2  CL 105  CO2 29  GLUCOSE 94  BUN 22  CREATININE 0.56  CALCIUM 8.8*    Physical Exam: BP 140/76 (BP Location: Right Arm)   Pulse 63   Temp 97.8 F (36.6 C)   Resp 16   Ht 5' (1.524 m)   Wt 63.2 kg   SpO2 93%   BMI 27.21 kg/m  Constitutional: No distress . Vital signs reviewed. HENT: Normocephalic.  Atraumatic. Eyes: EOMI. No discharge. Cardiovascular: No JVD. Respiratory: Normal effort.  No stridor. GI: Non-distended. Skin: Warm and dry.  Intact. Psych: Normal mood.  Normal behavior. Musc: Bilateral hallux valgus deformities Neurological: Alert Follows simple commands.   HOH Motor: Right upper extremity/right lower extremity: 5/5 proximal distal Left upper extremity: Shoulder abduction, elbow flexion/extension 4 -/5, handgrip 3+-4 -/5 with apraxia, stable Left lower extremity: 4-/5 hip flexion, knee extension, 2/5 ankle dorsiflexion No increase in tone appreciated  Assessment/Plan: 1. Functional deficits secondary to right pontine infarct which require 3+ hours per day of interdisciplinary therapy in a comprehensive inpatient rehab setting.  Physiatrist is providing close team supervision and 24 hour management of active medical problems listed below.  Physiatrist and rehab team continue to assess barriers to discharge/monitor patient progress toward functional and medical goals  Care  Tool:  Bathing    Body parts bathed by patient: Left arm, Chest, Abdomen, Right upper leg, Left upper leg, Face, Front perineal area, Right lower leg, Left lower leg, Right arm(used long sponge)   Body parts bathed by helper: Buttocks     Bathing assist Assist Level: Minimal Assistance - Patient > 75%     Upper Body Dressing/Undressing Upper body dressing   What is the patient wearing?: Pull over shirt    Upper body assist Assist Level: Moderate Assistance - Patient 50 - 74%    Lower Body Dressing/Undressing Lower body dressing      What is the patient wearing?: Pants, Underwear/pull up     Lower body assist Assist for lower body dressing: Moderate Assistance - Patient 50 - 74%     Toileting Toileting    Toileting assist Assist for toileting: Moderate Assistance - Patient 50 - 74%     Transfers Chair/bed transfer  Transfers assist     Chair/bed transfer assist level: Moderate Assistance - Patient 50 - 74% Chair/bed transfer assistive device: Programmer, multimedia   Ambulation assist      Assist level: Moderate Assistance - Patient 50 - 74% Assistive device: Other (comment)(toe up brace) Max distance: 10 ft   Walk 10 feet activity   Assist     Assist level: Moderate Assistance - Patient - 50 - 74% Assistive device: Walker-rolling   Walk 50 feet activity   Assist Walk 50 feet with 2 turns activity did not occur: Safety/medical concerns  Assist level: Minimal Assistance - Patient > 75% Assistive device: Walker-rolling  Walk 150 feet activity   Assist Walk 150 feet activity did not occur: Safety/medical concerns         Walk 10 feet on uneven surface  activity   Assist Walk 10 feet on uneven surfaces activity did not occur: Safety/medical concerns         Wheelchair     Assist Will patient use wheelchair at discharge?: No(tbd)             Wheelchair 50 feet with 2 turns activity    Assist             Wheelchair 150 feet activity     Assist            Medical Problem List and Plan: 1.  Left-sided weakness secondary to small right pontine infarction secondary small vessel disease on 07/30/2019.  Continue CIR 2.  Antithrombotics: -DVT/anticoagulation: Lovenox  Cr. WNL on 11/20             -antiplatelet therapy: Aspirin 81 mg daily and Plavix 75 mg daily x3 weeks then aspirin alone 3. Pain Management: Tylenol as needed  Baclofen nightly started on 11/15, increased to 20 on 11/17, increased to 30 on 11/20 with improvement 4. Mood: Xanax 0.25 mg bid scheduled, in addition to q12 prn doses  Lexapro 5 daily started on 11/23, consider further increase as needed  Team to provide emotional support             -antipsychotic agents: N/A 5. Neuropsych: This patient is capable of making decisions on her own behalf. 6. Skin/Wound Care: Routine skin checks 7. Fluids/Electrolytes/Nutrition: Routine in and outs.    BMP within acceptable range on 11/24 8.  History of CLL.  Followed outpatient by Dr. Marko Plume.    Afebrile  WBCs 32.9 on 11/19 9.  Hypertension.  Patient on Lotrel 5-20 mg daily as needed prior to admission.  Controlled on 11/24             Monitor with increased mobility 10.  Thrombocytopenia             Platelets 131 on 11/19, plan for repeat labs later this week 11.  Sleep disturbance:   See #3,4  Chamomile tea ordered as well  Improving 12. Hypoalbuminemia   Supplement initiated on 11/17 13. Slow transit constipation  Bowel meds increased on 11/20  Overall improving  LOS: 11 days A FACE TO FACE EVALUATION WAS PERFORMED  Ankit Lorie Phenix 08/12/2019, 8:51 AM

## 2019-08-13 ENCOUNTER — Inpatient Hospital Stay (HOSPITAL_COMMUNITY): Payer: PPO | Admitting: Physical Therapy

## 2019-08-13 ENCOUNTER — Inpatient Hospital Stay (HOSPITAL_COMMUNITY): Payer: PPO

## 2019-08-13 ENCOUNTER — Inpatient Hospital Stay (HOSPITAL_COMMUNITY): Payer: PPO | Admitting: Occupational Therapy

## 2019-08-13 NOTE — Progress Notes (Signed)
Social Work Patient ID: Abigail Moreno, female   DOB: 1928-09-20, 85 y.o.   MRN: [id number]  Met with pt and will speak to her daughter to discuss team conference progression toward her goals and the need to extend her stay due to slow progress toward her stated goals. She voiced she has other health issues and needs to learn how to do her tasks differently. She is concerned the insurance covers this but knows if she can do more she will fel less of a burden to her daughter. She is agreeable to her extension and will check with insurance regarding coverage.

## 2019-08-13 NOTE — Patient Care Conference (Signed)
Inpatient RehabilitationTeam Conference and Plan of Care Update Date: 08/13/2019   Time: 11:05 AM   Patient Name: Abigail Moreno      Medical Record Number: [id number]  Date of Birth: May 06, 1929 Sex: Female         Room/Bed: 4W15C/4W15C-01 Payor Info: Payor: [employer]AGE / Plan: [employer] PPO / Product Type: *No Product type* /    Admit Date/Time:  08/01/2019  6:47 PM  Primary Diagnosis:  Right pontine CVA [facility])  Patient Active Problem List   Diagnosis Date Noted  . Slow transit constipation   . Benign essential HTN   . Sleep disturbance   . Labile blood pressure   . Muscle spasm of left lower extremity   . Personal history of CLL (chronic lymphocytic leukemia)   . Pressure injury of skin 08/01/2019  . Right pontine CVA ([place]) 08/01/2019  . Acute CVA (cerebrovascular accident) ([place]) 07/30/2019  . Essential hypertension 07/30/2019  . Overweight (BMI 25.0-29.9) 09/18/2018  . S/P left THA, AA 09/17/2018  . Environmental allergies 07/19/2016  . History of left breast cancer 10/23/2015  . Status post hysterectomy 06/15/2015  . Unresolved grief 06/15/2015  . Thrombocytopenia ([place]) 06/15/2015  . Need for prophylactic vaccination and inoculation against influenza 06/15/2015  . Mixed hyperlipidemia 03/16/2015  . Osteopenia 03/16/2015  . Osteoarthritis 03/16/2015  . Vaginal atrophy 03/16/2015  . Papanicolaou smear for cervical cancer screening 03/16/2015  . Macular degeneration 01/07/2015  . CLL (chronic lymphocytic leukemia) ([place]) 11/18/2011  . Breast cancer ([place]) 11/18/2011    Expected Discharge Date: Expected Discharge Date: 08/27/19  Team Members Present: Physician leading conference: Dr. Delice Lesch Social Worker Present: Ovidio Kin, LCSW Nurse Present: Genene Churn, RN Case Manager: Karene Fry, RN PT Present: Georjean Mode, PT OT Present: Meriel Pica, OT SLP Present: Stormy Fabian, SLP PPS Coordinator present : Ileana Ladd, PT   Current Status/Progress Goal Weekly Team Focus  Bowel/Bladder   Continent B/B, occassional urinary incontinence LBM 11/24  Remain continent B/B  Attempt timed toileting   Swallow/Nutrition/ Hydration             ADL's   Supervison for UB bathing with mod assist for UB dressing.  She needs min assist for LB bathing and mod assist for LB dressing.  Mod assist for stand pivot transfer with use of the RW for support.  Supervision overall  Neuromuscular re-education, balance retraining, neuromuscular estim, pt/family education.   Mobility   min assist bed mobility and sit<>stands, mod assist for transfers and gait with RW (poor L hip flexion/foot clearance), max assist for steps  supervision  balance, gait, transfers, strength, L neuro re-ed, stairs, fam ed   Communication             Safety/Cognition/ Behavioral Observations            Pain   Rt shoulder pain (chronic) Tylenol PRN  less than 3 out of 10  Assess pain q shift and as needed   Skin   Facial brusing. MASD under breasts  no new skin breakdown  assess skin q shift and prn      *See Care Plan and progress notes for long and short-term goals.     Barriers to Discharge  Current Status/Progress Possible Resolutions Date Resolved   Nursing                  PT  Inaccessible home environment  6 STE  OT                  SLP                SW                Discharge Planning/Teaching Needs:  Home with daughter who is currnelty working from home. Pt seeing neuro-psych due to depression issue and feeling like she is a burden.      Team Discussion: Spasms, meds adjusted, sleeping better, started antidepressant.  RN - pressure injury bottom better, gas pain, cont B/B.  OT progressing slowly, UB slef care min/mod, LB self care mod/max, mod assist transfers.  PT min bed, mod A transfers, gait mod A, max A for steps, goals S overall, need order for AFO LLE from MD.   Revisions to Treatment Plan: N/A     Medical  Summary Current Status: Left-sided weakness secondary to small right pontine infarction secondary small vessel disease on 07/30/2019. Weekly Focus/Goal: Improve mobility spasms, sleep, anxiety/depression, platelets, follow WBCs  Barriers to Discharge: Medical stability   Possible Resolutions to Barriers: Therapies, sleep improving, follow labs, started anti-depressent   Continued Need for Acute Rehabilitation Level of Care: The patient requires daily medical management by a physician with specialized training in physical medicine and rehabilitation for the following reasons: Direction of a multidisciplinary physical rehabilitation program to maximize functional independence : Yes Medical management of patient stability for increased activity during participation in an intensive rehabilitation regime.: Yes Analysis of laboratory values and/or radiology reports with any subsequent need for medication adjustment and/or medical intervention. : Yes   I attest that I was present, lead the team conference, and concur with the assessment and plan of the team.   Retta Diones 08/13/2019, 12:53 PM  Team conference was held via web/ teleconference due to [place] - 19

## 2019-08-13 NOTE — Progress Notes (Signed)
[place] PHYSICAL MEDICINE & REHABILITATION PROGRESS NOTE  Subjective/Complaints: Patient seen laying in bed this morning.  She states she slept well overnight.  She states that she is willing to stay through the weekend, but not longer.  ROS: Denies CP, SOB, N/V/D  Objective: Vital Signs: Blood pressure 139/79, pulse 65, temperature 98.1 F (36.7 C), temperature source Oral, resp. rate 18, height 5' (1.524 m), weight 63.9 kg, SpO2 95 %. No results found. No results for input(s): WBC, HGB, HCT, PLT in the last 72 hours. Recent Labs    08/12/19 0500  NA 140  K 4.2  CL 105  CO2 29  GLUCOSE 94  BUN 22  CREATININE 0.56  CALCIUM 8.8*    Physical Exam: BP 139/79 (BP Location: Right Arm)   Pulse 65   Temp 98.1 F (36.7 C) (Oral)   Resp 18   Ht 5' (1.524 m)   Wt 63.9 kg   SpO2 95%   BMI 27.51 kg/m  Constitutional: No distress . Vital signs reviewed. HENT: Normocephalic.  Atraumatic. Eyes: EOMI. No discharge. Cardiovascular: No JVD. Respiratory: Normal effort.  No stridor. GI: Non-distended. Skin: Warm and dry.  Intact. Psych: Normal mood.  Normal behavior. Musc: Bilateral hallux valgus deformities Neurological: Alert Follows simple commands.   HOH Motor: Right upper extremity/right lower extremity: 5/5 proximal distal Left upper extremity: Shoulder abduction, elbow flexion/extension 4 -/5, handgrip 3+-4 -/5 with apraxia, unchanged Left lower extremity: 4-/5 hip flexion, knee extension, 3/5 ankle dorsiflexion No increase in tone appreciated  Assessment/Plan: 1. Functional deficits secondary to right pontine infarct which require 3+ hours per day of interdisciplinary therapy in a comprehensive inpatient rehab setting.  Physiatrist is providing close team supervision and 24 hour management of active medical problems listed below.  Physiatrist and rehab team continue to assess barriers to discharge/monitor patient progress toward functional and medical goals  Care  Tool:  Bathing    Body parts bathed by patient: Left arm, Chest, Abdomen, Right upper leg, Left upper leg, Face, Front perineal area, Right lower leg, Left lower leg, Right arm   Body parts bathed by helper: Buttocks     Bathing assist Assist Level: Minimal Assistance - Patient > 75%     Upper Body Dressing/Undressing Upper body dressing   What is the patient wearing?: Pull over shirt    Upper body assist Assist Level: Moderate Assistance - Patient 50 - 74%    Lower Body Dressing/Undressing Lower body dressing      What is the patient wearing?: Pants, Underwear/pull up     Lower body assist Assist for lower body dressing: Moderate Assistance - Patient 50 - 74%     Toileting Toileting    Toileting assist Assist for toileting: Moderate Assistance - Patient 50 - 74%     Transfers Chair/bed transfer  Transfers assist     Chair/bed transfer assist level: Minimal Assistance - Patient > 75% Chair/bed transfer assistive device: Programmer, multimedia   Ambulation assist      Assist level: 2 helpers Assistive device: Lite Gait Max distance: 175ft   Walk 10 feet activity   Assist     Assist level: 2 helpers Assistive device: Lite Gait   Walk 50 feet activity   Assist Walk 50 feet with 2 turns activity did not occur: Safety/medical concerns  Assist level: 2 helpers Assistive device: Lite Gait    Walk 150 feet activity   Assist Walk 150 feet activity did not occur: Safety/medical concerns  Assist level: 2 helpers Assistive device: Lite Gait    Walk 10 feet on uneven surface  activity   Assist Walk 10 feet on uneven surfaces activity did not occur: Safety/medical concerns         Wheelchair     Assist Will patient use wheelchair at discharge?: No(tbd)             Wheelchair 50 feet with 2 turns activity    Assist            Wheelchair 150 feet activity     Assist            Medical Problem List  and Plan: 1.  Left-sided weakness secondary to small right pontine infarction secondary small vessel disease on 07/30/2019.  Continue CIR  Team conference today to discuss current and goals and coordination of care, home and environmental barriers, and discharge planning with nursing, case manager, and therapies.  2.  Antithrombotics: -DVT/anticoagulation: Lovenox  Cr. WNL on 11/20             -antiplatelet therapy: Aspirin 81 mg daily and Plavix 75 mg daily x3 weeks then aspirin alone 3. Pain Management: Tylenol as needed  Baclofen nightly started on 11/15, increased to 20 on 11/17, increased to 30 on 11/20 with improvement 4. Mood: Xanax 0.25 mg bid scheduled, in addition to q12 prn doses  Lexapro 5 daily started on 11/23, consider further increase as needed, will discuss with team  Team to provide emotional support             -antipsychotic agents: N/A 5. Neuropsych: This patient is capable of making decisions on her own behalf. 6. Skin/Wound Care: Routine skin checks 7. Fluids/Electrolytes/Nutrition: Routine in and outs.    BMP within acceptable range on 11/24 8.  History of CLL.  Followed outpatient by Dr. Marko Plume.    Afebrile  WBCs 32.9 on 11/19, labs ordered for tomorrow 9.  Hypertension.  Patient on Lotrel 5-20 mg daily as needed prior to admission.  Labile on 11/25             Monitor with increased mobility 10.  Thrombocytopenia             Platelets 131 on 11/19, labs ordered for tomorrow 11.  Sleep disturbance:   See #3,4  Chamomile tea ordered as well  Improving 12. Hypoalbuminemia   Supplement initiated on 11/17 13. Slow transit constipation  Bowel meds increased on 11/20  Overall improving  LOS: 12 days A FACE TO FACE EVALUATION WAS PERFORMED  Ankit Lorie Phenix 08/13/2019, 9:20 AM

## 2019-08-13 NOTE — Progress Notes (Addendum)
Physical Therapy Session Note  Patient Details  Name: Abigail Moreno MRN: [id number] Date of Birth: 1928-10-29  Today's Date: 08/13/2019 PT Individual Time: 1300-1400 PT Individual Time Calculation (min): 60 min   Short Term Goals: Week 2:  PT Short Term Goal 1 (Week 2): Pt will perform transfers using LRAD with CGA PT Short Term Goal 2 (Week 2): Pt will ambulate at least 13ft using LRAD with CGA PT Short Term Goal 3 (Week 2): Pt will ascend/descend 1 step using LRAD with min assist      Skilled Therapeutic Interventions/Progress Updates:  Pt seated in w/c, finishing lunch.  She denied pain. Foot Up LAFO already donned.  neuromuscular re-education via forced use, multimodal cues for alternating reciprocal movment bil LEs using Kinetron from w/c level, at level 50 cm/sec x 25 cycles x 2 for quadriceps activation, x 25 cycles x 1 in trunk flexion for gluteal activation, and unilaterally LLE at level 40 cm/sec for mass extension with PT using other foot plate. Seated 20 x 2 bil scapular retractions with thoracic extension.  Sustained stretch L hamstrings in sitting using footstool.   Sit> stand with min/mod assist due to LOB posteriorly upon standing.  Pt reported that her LLE feels stiff as she tries to stand.  Gait training with heel build -up in R shoe, shoe cover L shoe, L Foot Up AFO, RW with L hand splint, x 40' , x 50' with min assist for progressing RW on straight trajectory, mod assist for balance during transitions to turn and sit.   At end of session, pt in w/c with needs at hand and seat belt alarm set.     Therapy Documentation Precautions:  Precautions Precautions: Fall Precaution Comments: L hemiparesis Restrictions Weight Bearing Restrictions: No        Therapy/Group: Individual Therapy  Thang Flett 08/13/2019, 4:41 PM

## 2019-08-13 NOTE — Progress Notes (Signed)
Occupational Therapy Session Note  Patient Details  Name: Abigail Moreno MRN: [id number] Date of Birth: 1929-05-15  Today's Date: 08/13/2019 OT Individual Time: 5681-2751 OT Individual Time Calculation (min): 75 min    Short Term Goals: Week 1:  OT Short Term Goal 1 (Week 1): Pt will don shirt with contact guard without chair support OT Short Term Goal 1 - Progress (Week 1): Progressing toward goal OT Short Term Goal 2 (Week 1): Pt will perform sit to stand with min A consistently for tranfsers and clothing management OT Short Term Goal 2 - Progress (Week 1): Met OT Short Term Goal 3 (Week 1): Pt will performed toilet transfers with min A with LRAD OT Short Term Goal 3 - Progress (Week 1): Progressing toward goal OT Short Term Goal 4 (Week 1): Pt will perform one grooming tasks in standing with contact guard. OT Short Term Goal 4 - Progress (Week 1): Met Week 2:  OT Short Term Goal 1 (Week 2): Pt will be able to bathe RUE using LUE as an active A. OT Short Term Goal 2 (Week 2): Pt will be able to bathe feet with a long handled sponge. OT Short Term Goal 3 (Week 2): Pt will be able to don pullover shirt sitting on EOB with min A. OT Short Term Goal 4 (Week 2): Using AE as needed, pt will be able to thread pants over feet with min A. OT Short Term Goal 5 (Week 2): Pt will transfer to the toilet with min A.  Skilled Therapeutic Interventions/Progress Updates:    Pt received in bed, declining shower or bath today but did want to change clothing.  Prior to dressing, had pt lay supine in bed with HOB flat to work on LUE/ LLE exercises.  AROM of scapula elev/dep, prot/retr, and overhead tricep extension followed by wrist extension.  Pt now has 75%of active finger extension. A/arom for sh flexion and abd, dorsiflexion, hip flexion.  Resisted knee  Extension by pt pushing away with her foot.   Bed mobility with rolling onto L side with CGA and then sitting up with min A.  Close S with  dynamic sit balance as she doffed shirt with verbal cues only. Donned shirt with mod A - limited by R shoulder joint limitations.  From EOB. Donned pants over feet with mod A and then stood with min to pull over hips with min A.   Donned TEDS and shoes for pt.  Worked on stand step transfer to her R using her RW to move to wc.  Min -mod A transfer due to poor coordination of LLE. Cues to take small graded steps.  From w/c, applied estim to L deltoid with Saebo stim go.  Pt worked on functional estim with reaching arm out into extension aided by the UE ranger and A from therapist. estim left on pt for 45 min for cyclic estim and then removed. Pt tolerated well.    Saebo Stim Go 330 pulse width 35 Hz pulse rate On 8 sec/ off 8 sec Ramp up/ down 2 sec Symmetrical Biphasic wave form  Max intensity 174m at 500 Ohm load  Pt resting in w/c with all needs met, belt alarm on . .Marland Kitchen Therapy Documentation Precautions:  Precautions Precautions: Fall Precaution Comments: L hemiparesis Restrictions Weight Bearing Restrictions: No   Pain: Pain Assessment Pain Scale: Faces Pain Score: 0-No pain ADL: ADL Grooming: Setup, Minimal assistance Where Assessed-Grooming: Sitting at sink Upper Body Bathing: Minimal assistance  Where Assessed-Upper Body Bathing: Shower Lower Body Bathing: Minimal assistance Where Assessed-Lower Body Bathing: Shower Upper Body Dressing: Moderate assistance Where Assessed-Upper Body Dressing: Sitting at sink, Wheelchair Lower Body Dressing: Maximal assistance Where Assessed-Lower Body Dressing: Sitting at sink, Wheelchair Toileting: Maximal assistance Where Assessed-Toileting: Glass blower/designer: Moderate assistance Toilet Transfer Method: Stand pivot Science writer: Energy manager: Environmental education officer Method: Education officer, environmental: Grab bars, Transfer tub bench   Therapy/Group: Individual  Therapy  [place] 08/13/2019, 11:27 AM

## 2019-08-13 NOTE — Progress Notes (Signed)
Occupational Therapy Session Note  Patient Details  Name: Abigail Moreno MRN: [id number] Date of Birth: [birth year]  Today's Date: 08/13/2019 OT Individual Time: 1516-1600 OT Individual Time Calculation (min): 44 min    Short Term Goals: Week 1:  OT Short Term Goal 1 (Week 1): Pt will don shirt with contact guard without chair support OT Short Term Goal 1 - Progress (Week 1): Progressing toward goal OT Short Term Goal 2 (Week 1): Pt will perform sit to stand with min A consistently for tranfsers and clothing management OT Short Term Goal 2 - Progress (Week 1): Met OT Short Term Goal 3 (Week 1): Pt will performed toilet transfers with min A with LRAD OT Short Term Goal 3 - Progress (Week 1): Progressing toward goal OT Short Term Goal 4 (Week 1): Pt will perform one grooming tasks in standing with contact guard. OT Short Term Goal 4 - Progress (Week 1): Met  Skilled Therapeutic Interventions/Progress Updates:     1;1. Pt received in bed agreeable to OT with no pain reported. Pt completes stand pivot transfer w/c<>EOM with MIN A, VC for weight shifitng forward to back up to surface and intermittent A for advancement of LLE. Pt sits EOM completing shoulder elevation/depression and scap pro/retraction 2x8 with tactile cues for NMR. Pt stands to complete shoulder flexion/pretraction and extension/retraction exercises while gross grasping onto a bench for NMR. Pt completes folding 3 pillow cases with mod facilitation of thumb extension and min A for reaching in mod ranges outside BOS to improve BUE coordination. Pt completes box and blocks assessment with RUE 29 and LUE 3. Exited session wit pt seated in w.c, call light inreach and all needs met  Therapy Documentation Precautions:  Precautions Precautions: Fall Precaution Comments: L hemiparesis Restrictions Weight Bearing Restrictions: No General:   Vital Signs: Therapy Vitals Temp: 97.7 F (36.5 C) Temp Source: Oral Pulse Rate:  66 Resp: 18 BP: 108/61 Patient Position (if appropriate): Sitting Oxygen Therapy SpO2: 95 % O2 Device: Room Air Pain:   ADL: ADL Grooming: Setup, Minimal assistance Where Assessed-Grooming: Sitting at sink Upper Body Bathing: Minimal assistance Where Assessed-Upper Body Bathing: Shower Lower Body Bathing: Minimal assistance Where Assessed-Lower Body Bathing: Shower Upper Body Dressing: Moderate assistance Where Assessed-Upper Body Dressing: Sitting at sink, Wheelchair Lower Body Dressing: Maximal assistance Where Assessed-Lower Body Dressing: Sitting at sink, Wheelchair Toileting: Maximal assistance Where Assessed-Toileting: Glass blower/designer: Moderate assistance Toilet Transfer Method: Stand pivot Science writer: Energy manager: Environmental education officer Method: Education officer, environmental: Grab bars, Nurse, learning disability    Praxis   Exercises:   Other Treatments:     Therapy/Group: Individual Therapy  Tonny Branch 08/13/2019, 4:12 PM

## 2019-08-14 DIAGNOSIS — D62 Acute posthemorrhagic anemia: Secondary | ICD-10-CM

## 2019-08-14 DIAGNOSIS — F329 Major depressive disorder, single episode, unspecified: Secondary | ICD-10-CM

## 2019-08-14 LAB — CBC WITH DIFFERENTIAL/PLATELET
Abs Immature Granulocytes: 0.04 10*3/uL (ref 0.00–0.07)
Basophils Absolute: 0.1 10*3/uL (ref 0.0–0.1)
Basophils Relative: 0 %
Eosinophils Absolute: 0.2 10*3/uL (ref 0.0–0.5)
Eosinophils Relative: 1 %
HCT: 37.5 % (ref 36.0–46.0)
Hemoglobin: 11.8 g/dL — ABNORMAL LOW (ref 12.0–15.0)
Immature Granulocytes: 0 %
Lymphocytes Relative: 86 %
Lymphs Abs: 22.2 10*3/uL — ABNORMAL HIGH (ref 0.7–4.0)
MCH: 30.6 pg (ref 26.0–34.0)
MCHC: 31.5 g/dL (ref 30.0–36.0)
MCV: 97.2 fL (ref 80.0–100.0)
Monocytes Absolute: 1.1 10*3/uL — ABNORMAL HIGH (ref 0.1–1.0)
Monocytes Relative: 4 %
Neutro Abs: 2.2 10*3/uL (ref 1.7–7.7)
Neutrophils Relative %: 9 %
Platelets: 125 10*3/uL — ABNORMAL LOW (ref 150–400)
RBC: 3.86 MIL/uL — ABNORMAL LOW (ref 3.87–5.11)
RDW: 14.6 % (ref 11.5–15.5)
WBC: 25.8 10*3/uL — ABNORMAL HIGH (ref 4.0–10.5)
nRBC: 0 % (ref 0.0–0.2)

## 2019-08-14 LAB — BASIC METABOLIC PANEL
Anion gap: 10 (ref 5–15)
BUN: 20 mg/dL (ref 8–23)
CO2: 26 mmol/L (ref 22–32)
Calcium: 8.7 mg/dL — ABNORMAL LOW (ref 8.9–10.3)
Chloride: 104 mmol/L (ref 98–111)
Creatinine, Ser: 0.5 mg/dL (ref 0.44–1.00)
GFR calc Af Amer: >60 mL/min (ref >60)
GFR calc non Af Amer: >60 mL/min (ref >60)
Glucose, Bld: 91 mg/dL (ref 70–99)
Potassium: 4.3 mmol/L (ref 3.5–5.1)
Sodium: 140 mmol/L (ref 135–145)

## 2019-08-14 NOTE — Plan of Care (Signed)
  Problem: Consults Goal: RH STROKE PATIENT EDUCATION Description: See Patient Education module for education specifics  Outcome: Progressing Goal: Nutrition Consult-if indicated Outcome: Progressing   Problem: RH BOWEL ELIMINATION Goal: RH STG MANAGE BOWEL WITH ASSISTANCE Description: STG Manage Bowel with min Assistance. Outcome: Progressing Goal: RH STG MANAGE BOWEL W/MEDICATION W/ASSISTANCE Description: STG Manage Bowel with Medication with min Assistance. Outcome: Progressing   Problem: RH BLADDER ELIMINATION Goal: RH STG MANAGE BLADDER WITH ASSISTANCE Description: STG Manage Bladder With min Assistance Outcome: Progressing   Problem: RH SKIN INTEGRITY Goal: RH STG MAINTAIN SKIN INTEGRITY WITH ASSISTANCE Description: STG Maintain Skin Integrity With min Assistance. Outcome: Progressing Goal: RH STG ABLE TO PERFORM INCISION/WOUND CARE W/ASSISTANCE Description: STG Able To Perform Incision/Wound Care With min Assistance. Outcome: Progressing   Problem: RH SAFETY Goal: RH STG ADHERE TO SAFETY PRECAUTIONS W/ASSISTANCE/DEVICE Description: STG Adhere to Safety Precautions With Cues and reminder Assistance/Device. Outcome: Progressing   Problem: RH COGNITION-NURSING Goal: RH STG USES MEMORY AIDS/STRATEGIES W/ASSIST TO PROBLEM SOLVE Description: STG Uses Memory Aids/Strategies With cues and reminders Assistance to Problem Solve. Outcome: Progressing Goal: RH STG ANTICIPATES NEEDS/CALLS FOR ASSIST W/ASSIST/CUES Description: STG Anticipates Needs/Calls for Assist With cues and reminders Assistance/Cues. Outcome: Progressing   Problem: RH PAIN MANAGEMENT Goal: RH STG PAIN MANAGED AT OR BELOW PT'S PAIN GOAL Description: Pain less than 2. Outcome: Progressing   Problem: RH KNOWLEDGE DEFICIT Goal: RH STG INCREASE KNOWLEDGE OF STROKE PROPHYLAXIS Description: Pt will be able to demonstrate understanding of adherence to medications to prevent stroke, and follow the DASH diet with  mod I assist using handouts. Outcome: Progressing

## 2019-08-14 NOTE — Progress Notes (Signed)
Abigail Moreno PHYSICAL MEDICINE & REHABILITATION PROGRESS NOTE  Subjective/Complaints: Patient seen sitting up in bed this morning.  She states she slept well overnight.  She notes a few mild cramps but controlled for the most part.    ROS: Denies CP, SOB, N/V/D  Objective: Vital Signs: Blood pressure 112/63, pulse 62, temperature (!) 97.4 F (36.3 C), temperature source Oral, resp. rate 17, height 5' (1.524 m), weight 63.9 kg, SpO2 93 %. No results found. Recent Labs    08/14/19 0510  WBC 25.8*  HGB 11.8*  HCT 37.5  PLT 125*   Recent Labs    08/12/19 0500 08/14/19 0510  NA 140 140  K 4.2 4.3  CL 105 104  CO2 29 26  GLUCOSE 94 91  BUN 22 20  CREATININE 0.56 0.50  CALCIUM 8.8* 8.7*    Physical Exam: BP 112/63 (BP Location: Right Arm)   Pulse 62   Temp (!) 97.4 F (36.3 C) (Oral)   Resp 17   Ht 5' (1.524 m)   Wt 63.9 kg   SpO2 93%   BMI 27.51 kg/m  Constitutional: No distress . Vital signs reviewed. HENT: Normocephalic.  Atraumatic. Eyes: EOMI. No discharge. Cardiovascular: No JVD. Respiratory: Normal effort.  No stridor. GI: Non-distended. Skin: Warm and dry.  Intact. Psych: Normal mood.  Normal behavior. Musc: Bilateral hallux valgus deformities Neurological: Alert Follows simple commands.   HOH Motor:  Left upper extremity: Shoulder abduction, elbow flexion/extension 4 -/5, handgrip 3+-4 -/5 with apraxia, slowly improving Left lower extremity: 4-/5 hip flexion, knee extension, 3/5 ankle dorsiflexion, unchanged No increase in tone appreciated  Assessment/Plan: 1. Functional deficits secondary to right pontine infarct which require 3+ hours per day of interdisciplinary therapy in a comprehensive inpatient rehab setting.  Physiatrist is providing close team supervision and 24 hour management of active medical problems listed below.  Physiatrist and rehab team continue to assess barriers to discharge/monitor patient progress toward functional and medical  goals  Care Tool:  Bathing    Body parts bathed by patient: Left arm, Chest, Abdomen, Right upper leg, Left upper leg, Face, Front perineal area, Right lower leg, Left lower leg, Right arm   Body parts bathed by helper: Buttocks     Bathing assist Assist Level: Minimal Assistance - Patient > 75%     Upper Body Dressing/Undressing Upper body dressing   What is the patient wearing?: Pull over shirt    Upper body assist Assist Level: Moderate Assistance - Patient 50 - 74%    Lower Body Dressing/Undressing Lower body dressing      What is the patient wearing?: Pants, Underwear/pull up     Lower body assist Assist for lower body dressing: Moderate Assistance - Patient 50 - 74%     Toileting Toileting    Toileting assist Assist for toileting: Moderate Assistance - Patient 50 - 74%     Transfers Chair/bed transfer  Transfers assist     Chair/bed transfer assist level: Moderate Assistance - Patient 50 - 74% Chair/bed transfer assistive device: Walker, Orthosis   Locomotion Ambulation   Ambulation assist      Assist level: Moderate Assistance - Patient 50 - 74% Assistive device: Walker-rolling Max distance: 50   Walk 10 feet activity   Assist     Assist level: Minimal Assistance - Patient > 75% Assistive device: Walker-rolling, Orthosis   Walk 50 feet activity   Assist Walk 50 feet with 2 turns activity did not occur: Safety/medical concerns  Assist level:  Moderate Assistance - Patient - 50 - 74% Assistive device: Walker-rolling, Orthosis    Walk 150 feet activity   Assist Walk 150 feet activity did not occur: Safety/medical concerns  Assist level: 2 helpers Assistive device: Lite Gait    Walk 10 feet on uneven surface  activity   Assist Walk 10 feet on uneven surfaces activity did not occur: Safety/medical concerns         Wheelchair     Assist Will patient use wheelchair at discharge?: No(tbd)             Wheelchair  50 feet with 2 turns activity    Assist            Wheelchair 150 feet activity     Assist            Medical Problem List and Plan: 1.  Left-sided weakness secondary to small right pontine infarction secondary small vessel disease on 07/30/2019.  Continue CIR 2.  Antithrombotics: -DVT/anticoagulation: Lovenox  Cr. WNL on 11/20             -antiplatelet therapy: Aspirin 81 mg daily and Plavix 75 mg daily x3 weeks then aspirin alone 3. Pain Management: Tylenol as needed  Baclofen nightly started on 11/15, increased to 20 on 11/17, increased to 30 on 11/20 with improvement 4. Mood: Xanax 0.25 mg bid scheduled, in addition to q12 prn doses  Lexapro 5 daily started on 11/23, appears to be improving  Team to provide emotional support             -antipsychotic agents: N/A 5. Neuropsych: This patient is capable of making decisions on her own behalf. 6. Skin/Wound Care: Routine skin checks 7. Fluids/Electrolytes/Nutrition: Routine in and outs.    BMP within acceptable range on 11/26 8.  History of CLL.  Followed outpatient by Dr. Marko Plume.    Afebrile  WBCs 25.8 on 11/26  Continue to monitor 9.  Hypertension.  Patient on Lotrel 5-20 mg daily as needed prior to admission.  Controlled on 11/26             Monitor with increased mobility 10.  Thrombocytopenia             Platelets 125 on 11/26  Continue to monitor 11.  Sleep disturbance:   See #3,4  Chamomile tea ordered as well  Improving 12. Hypoalbuminemia   Supplement initiated on 11/17 13. Slow transit constipation  Bowel meds increased on 11/20  Overall improving 14.  Acute blood loss anemia  Hemoglobin 11.8 on 11/26  Continue to monitor  LOS: 13 days A FACE TO FACE EVALUATION WAS PERFORMED  Abigail Moreno Abigail Moreno 08/14/2019, 9:33 AM

## 2019-08-15 ENCOUNTER — Inpatient Hospital Stay (HOSPITAL_COMMUNITY): Payer: PPO | Admitting: Occupational Therapy

## 2019-08-15 ENCOUNTER — Inpatient Hospital Stay (HOSPITAL_COMMUNITY): Payer: PPO | Admitting: Physical Therapy

## 2019-08-15 LAB — CREATININE, SERUM
Creatinine, Ser: 0.53 mg/dL (ref 0.44–1.00)
GFR calc Af Amer: >60 mL/min (ref >60)
GFR calc non Af Amer: >60 mL/min (ref >60)

## 2019-08-15 NOTE — Progress Notes (Signed)
[place] PHYSICAL MEDICINE & REHABILITATION PROGRESS NOTE  Subjective/Complaints: Up in chair. Just finished breakfast. Acknowledged that she's staying a little longer "to work on my right leg." reports mild RTC pain which is alleviated with tylenol  ROS: Patient denies fever, rash, sore throat, blurred vision, nausea, vomiting, diarrhea, cough, shortness of breath or chest pain, joint or back pain, headache, or mood change.    Objective: Vital Signs: Blood pressure 110/63, pulse 63, temperature (!) 97.5 F (36.4 C), temperature source Oral, resp. rate 17, height 5' (1.524 m), weight 63.9 kg, SpO2 97 %. No results found. Recent Labs    08/14/19 0510  WBC 25.8*  HGB 11.8*  HCT 37.5  PLT 125*   Recent Labs    08/14/19 0510 08/15/19 0608  NA 140  --   K 4.3  --   CL 104  --   CO2 26  --   GLUCOSE 91  --   BUN 20  --   CREATININE 0.50 0.53  CALCIUM 8.7*  --     Physical Exam: BP 110/63 (BP Location: Right Arm)   Pulse 63   Temp (!) 97.5 F (36.4 C) (Oral)   Resp 17   Ht 5' (1.524 m)   Wt 63.9 kg   SpO2 97%   BMI 27.51 kg/m  Constitutional: No distress . Vital signs reviewed. HEENT: EOMI, oral membranes moist Neck: supple Cardiovascular: RRR without murmur. No JVD    Respiratory: CTA Bilaterally without wheezes or rales. Normal effort    GI: BS +, non-tender, non-distended  Skin: Warm and dry.  Intact. Psych: Normal mood.  Normal behavior. Musc: Bilateral hallux valgus deformities, no pain with AROM right shoulder Neurological: Alert and oriented x 3 Follows simple commands.   HOH Motor:  Left upper extremity: Shoulder abduction, elbow flexion/extension 4 -/5, handgrip 3+-4 -/5 with apraxia--generally stable Left lower extremity: 4-/5 hip flexion, knee extension, 3/5 ankle dorsiflexion, unchanged No increase in tone seen.  Wearing foot up orthotic  Assessment/Plan: 1. Functional deficits secondary to right pontine infarct which require 3+ hours per day of  interdisciplinary therapy in a comprehensive inpatient rehab setting.  Physiatrist is providing close team supervision and 24 hour management of active medical problems listed below.  Physiatrist and rehab team continue to assess barriers to discharge/monitor patient progress toward functional and medical goals  Care Tool:  Bathing    Body parts bathed by patient: Left arm, Chest, Abdomen, Right upper leg, Left upper leg, Face, Front perineal area, Right lower leg, Left lower leg, Right arm, Buttocks   Body parts bathed by helper: Buttocks     Bathing assist Assist Level: Contact Guard/Touching assist     Upper Body Dressing/Undressing Upper body dressing   What is the patient wearing?: Pull over shirt    Upper body assist Assist Level: Moderate Assistance - Patient 50 - 74%    Lower Body Dressing/Undressing Lower body dressing      What is the patient wearing?: Pants, Underwear/pull up     Lower body assist Assist for lower body dressing: Moderate Assistance - Patient 50 - 74%     Toileting Toileting    Toileting assist Assist for toileting: Moderate Assistance - Patient 50 - 74%     Transfers Chair/bed transfer  Transfers assist     Chair/bed transfer assist level: Minimal Assistance - Patient > 75% Chair/bed transfer assistive device: Walker, Orthosis   Locomotion Ambulation   Ambulation assist      Assist level:  Moderate Assistance - Patient 50 - 74% Assistive device: Walker-rolling Max distance: 50   Walk 10 feet activity   Assist     Assist level: Minimal Assistance - Patient > 75% Assistive device: Walker-rolling, Orthosis   Walk 50 feet activity   Assist Walk 50 feet with 2 turns activity did not occur: Safety/medical concerns  Assist level: Moderate Assistance - Patient - 50 - 74% Assistive device: Walker-rolling, Orthosis    Walk 150 feet activity   Assist Walk 150 feet activity did not occur: Safety/medical  concerns  Assist level: 2 helpers Assistive device: Lite Gait    Walk 10 feet on uneven surface  activity   Assist Walk 10 feet on uneven surfaces activity did not occur: Safety/medical concerns         Wheelchair     Assist Will patient use wheelchair at discharge?: No(tbd)             Wheelchair 50 feet with 2 turns activity    Assist            Wheelchair 150 feet activity     Assist            Medical Problem List and Plan: 1.  Left-sided weakness secondary to small right pontine infarction secondary small vessel disease on 07/30/2019.  Continue CIR PT, OT 2.  Antithrombotics: -DVT/anticoagulation: Lovenox  Cr. WNL on 11/20             -antiplatelet therapy: Aspirin 81 mg daily and Plavix 75 mg daily x3 weeks then aspirin alone 3. Pain Management: Tylenol as needed  Baclofen nightly started on 11/15 for spasticity, increased to 20 on 11/17, increased to 30 on 11/20 with improvement. Seems to be tolerating well 4. Mood: Xanax 0.25 mg bid scheduled, in addition to q12 prn doses  Lexapro 5 daily started on 11/23, appears to be improving  Team to provide emotional support             -antipsychotic agents: N/A 5. Neuropsych: This patient is capable of making decisions on her own behalf. 6. Skin/Wound Care: Routine skin checks 7. Fluids/Electrolytes/Nutrition: Routine in and outs.    BMP within acceptable range on 11/26 8.  History of CLL.  Followed outpatient by Dr. Marko Plume.    Afebrile  WBCs 25.8 on 11/26  Continue to monitor 9.  Hypertension.  Patient on Lotrel 5-20 mg daily as needed prior to admission.  Controlled on 11/27             Monitor with increased mobility 10.  Thrombocytopenia             Platelets 125 on 11/26  Continue to monitor 11.  Sleep disturbance:   See #3,4  Chamomile tea ordered as well  Improving 12. Hypoalbuminemia   Supplement initiated on 11/17 13. Slow transit constipation  Bowel meds increased on  11/20  Large bm's 11/26 14.  Acute blood loss anemia  Hemoglobin 11.8 on 11/26  Continue to monitor  LOS: 14 days A FACE TO FACE EVALUATION WAS PERFORMED  Meredith Staggers 08/15/2019, 12:05 PM

## 2019-08-15 NOTE — Progress Notes (Signed)
Occupational Therapy Weekly Progress Note  Patient Details  Name: Abigail Moreno MRN: [id number] Date of Birth: May 20, 1929  Beginning of progress report period: August 08, 2019 End of progress report period: August 15, 2019  Today's Date: 08/15/2019 OT Individual Time: 8416-6063 OT Individual Time Calculation (min): 75 min    Patient has met 4 of 5 short term goals.  She continues to struggle with UB dressing, needing mod A due to kyphotic posture and limited R shoulder ROM along with limited L shoulder active ROM. She has made excellent progress this week with her LLE strength and coordination to be able to stand step transfer, hold standing balance during LB self care with min A.  She is now actively using LUE as an active A to wash her R arm and uses the long sponge to reach her feet.   Patient continues to demonstrate the following deficits: muscle weakness, decreased cardiorespiratoy endurance, unbalanced muscle activation and decreased coordination and decreased standing balance, decreased postural control, hemiplegia and decreased balance strategies and therefore will continue to benefit from skilled OT intervention to enhance overall performance with BADL.  Patient progressing toward long term goals..  Continue plan of care.  OT Short Term Goals Week 1:  OT Short Term Goal 1 (Week 1): Pt will don shirt with contact guard without chair support OT Short Term Goal 1 - Progress (Week 1): Progressing toward goal OT Short Term Goal 2 (Week 1): Pt will perform sit to stand with min A consistently for tranfsers and clothing management OT Short Term Goal 2 - Progress (Week 1): Met OT Short Term Goal 3 (Week 1): Pt will performed toilet transfers with min A with LRAD OT Short Term Goal 3 - Progress (Week 1): Progressing toward goal OT Short Term Goal 4 (Week 1): Pt will perform one grooming tasks in standing with contact guard. OT Short Term Goal 4 - Progress (Week 1): Met Week 2:   OT Short Term Goal 1 (Week 2): Pt will be able to bathe RUE using LUE as an active A. OT Short Term Goal 1 - Progress (Week 2): Met OT Short Term Goal 2 (Week 2): Pt will be able to bathe feet with a long handled sponge. OT Short Term Goal 2 - Progress (Week 2): Met OT Short Term Goal 3 (Week 2): Pt will be able to don pullover shirt sitting on EOB with min A. OT Short Term Goal 3 - Progress (Week 2): Progressing toward goal OT Short Term Goal 4 (Week 2): Using AE as needed, pt will be able to thread pants over feet with min A. OT Short Term Goal 4 - Progress (Week 2): Met OT Short Term Goal 5 (Week 2): Pt will transfer to the toilet with min A. OT Short Term Goal 5 - Progress (Week 2): Met Week 3:  OT Short Term Goal 1 (Week 3): Pt will complete toilet transfer with CGA stand pivot. OT Short Term Goal 2 (Week 3): Pt will complete toileting with min A. OT Short Term Goal 3 (Week 3): Pt will complete LB dressing min A OT Short Term Goal 4 (Week 3): Pt will complete UB dressing min A. OT Short Term Goal 5 (Week 3): Pt will stand with CGA as she pulls pants over hips with min A.  Skilled Therapeutic Interventions/Progress Updates:  Pt received in bed ready for therapy: ADL Retraining: Pt completed toileting, shower, dressing and grooming -see details below.  Improved balance allowed her to be  more Independent with LB self care and improved LUE strength allowed her to be more independent with bathing.    Transfers:  Min rolling and sidelying to sit min A. min A stand step pivot transfers bed to w/c >< toilet >< tub bench to w/c.  Improved coordination and control with stepping L foot.  Cued to take small steps and not to rush the sitting down as she needs cues to ensure her feet are in alignment with hips.  Balance:  CGA sit to stand and with static balance, min A dynamic as she stood to cleanse bottom at toilet and shower, pulled up pants with min A.  Improved wt bearing through LLE with knee  extension.   Neuromuscular Re-Education: At end of session placed estim device on L deltoid for cyclic estim for 60 min unattended.  Pt tolerated up to 6 clicks today, no skin reaction.      Saebo Stim Go 330 pulse width 35 Hz pulse rate On 8 sec/ off 8 sec Ramp up/ down 2 sec Symmetrical Biphasic wave form  Max intensity 141m at 500 Ohm load  Pt resting in w/c with belt alarm on and all needs met. Call light and phone in reach.   Therapy Documentation Precautions:  Precautions Precautions: Fall Precaution Comments: L hemiparesis Restrictions Weight Bearing Restrictions: No      Pain: no c/o pain    ADL: ADL Grooming: Setup Where Assessed-Grooming: Sitting at sink Upper Body Bathing: Supervision/safety Where Assessed-Upper Body Bathing: Shower Lower Body Bathing: Contact guard Where Assessed-Lower Body Bathing: Shower Upper Body Dressing: Moderate assistance Where Assessed-Upper Body Dressing: Edge of bed Lower Body Dressing: Moderate assistance Where Assessed-Lower Body Dressing: Edge of bed Toileting: Moderate assistance Where Assessed-Toileting: TGlass blower/designer MPsychiatric nurseMethod: SArts development officer GEnergy manager MEnvironmental education officerMethod: SRadiographer, therapeutic Grab bars, Transfer tub bench   Therapy/Group: Individual Therapy  [place]/27/2020, 12:30 PM

## 2019-08-15 NOTE — Plan of Care (Signed)
  Problem: RH Stairs Goal: LTG Patient will ambulate up and down stairs w/assist (PT) Description: LTG: Patient will ambulate up and down # of stairs with assistance (PT) 08/15/2019 1249 by Tawana Scale, PT Flowsheets Taken 08/15/2019 1249 LTG: Pt will ambulate up/down stairs assist needed:: (updated based on patient's home set-up) Contact Guard/Touching assist Taken 08/15/2019 1247 LTG: Pt will  ambulate up and down number of stairs: 4 steps using left handrail Note: updated based on patient's home set-up

## 2019-08-15 NOTE — Progress Notes (Signed)
Physical Therapy Weekly Progress Note  Patient Details  Name: Abigail Moreno MRN: [id number] Date of Birth: Sep 25, 1928  Beginning of progress report period: August 08, 2019 End of progress report period: August 15, 2019  Today's Date: 08/15/2019 PT Individual Time: 1107-1209 and 2500-3704 PT Individual Time Calculation (min): 62 min and 64 min  Patient has met 0 of 3 short term goals. Ms. Hoffman is making slow progress towards long term goals and is consistently requiring min assist with intermittent mod assist for functional mobility tasks including bed mobility, tranfers and gait up to 8f using RW as well as max assist for 4 stair navigation.  Patient continues to demonstrate the following deficits muscle weakness, muscle joint tightness and muscle paralysis, decreased cardiorespiratoy endurance, impaired timing and sequencing, abnormal tone, unbalanced muscle activation, decreased coordination and decreased motor planning and decreased sitting balance, decreased standing balance, decreased postural control and decreased balance strategies and therefore will continue to benefit from skilled PT intervention to increase functional independence with mobility.  Patient is making slow progress towards long term goals - will continue to monitor patient's progress to determine if LTG revisions are necessary. Plan of care revisions: Patient's length of stay and POC has been extended..  PT Short Term Goals Week 2:  PT Short Term Goal 1 (Week 2): Pt will perform transfers using LRAD with CGA PT Short Term Goal 1 - Progress (Week 2): Progressing toward goal PT Short Term Goal 2 (Week 2): Pt will ambulate at least 582fusing LRAD with CGA PT Short Term Goal 2 - Progress (Week 2): Progressing toward goal PT Short Term Goal 3 (Week 2): Pt will ascend/descend 1 step using LRAD with min assist PT Short Term Goal 3 - Progress (Week 2): Progressing toward goal Week 3:  PT Short Term Goal 1  (Week 3): Patient will perform functional transfers using LRAD with CGA PT Short Term Goal 2 (Week 3): Pt will ambulate at least 5092fsing LRAD with CGA PT Short Term Goal 3 (Week 3): Pt will ascend/descend 4 steps using L handrail with min assist  Skilled Therapeutic Interventions/Progress Updates:  Ambulation/gait training;DME/adaptive equipment instruction;Neuromuscular re-education;Psychosocial support;Stair training;UE/LE Strength taining/ROM;Balance/vestibular training;Discharge planning;Pain management;Skin care/wound management;Therapeutic Activities;UE/LE Coordination activities;Cognitive remediation/compensation;Disease management/prevention;Functional mobility training;Patient/family education;Splinting/orthotics;Therapeutic Exercise;Visual/perceptual remediation/compensation;Functional electrical stimulation;Community reintegration;Wheelchair propulsion/positioning   Session 1: Pt received sitting in w/c, asleep and upon awakening agreeable to therapy session; however, reporting she is having a hard time waking up today because she received her medication so late last night. Transported to/from gym in w/c for time management and energy conservation. Sit<>stands using RW with min assist for lifting/lowering and multimodal cuing for increased anterior trunk lean and weight shift upon coming to standing. Pt wearing L LE toe up brace and donned shoe cover. Gait training and L LE NMR for 68f68fing RW with min assist for balance and max cuing for increased L LE foot clearance and step length.   Donned saebo on L LE anterior tibalis for NMES to perform standing L LE NMR to increase ankle DF during repeated L LE step up/down on 2" step with B UE support on RW and min assist for balance and mod assist for L LE placement on/off step. Saebo Stim Go Parameters 330 pulse width 35 Hz pulse rate On 8 sec/ off 8 sec Ramp up/ down 2 sec Symmetrical Biphasic wave form  Max intensity 110mA60m500 Ohm  load With pt tolerating 8 clicks with no skin reaction nor reports of discomfort.  Doffed saebo device.   Stand pivot w/c>EOM using RW with min assist primarily for increased anterior trunk lean upon coming to standing - continues to require mod assist for sequencing of LE stepping and AD management. Standing L LE NMR with B UE support on RW while stepping L foot forward/backwards over bar to/from external target with CGA/min assist for balance and max multimodal cuing for increased hip/knee flexion. Pt reporting R shoulder becoming increasingly uncomfortable therefore performed same task without UE support to decrease weightbearing through R UE with mod assist for balance. Performed standing L LE NMR and dynamic standing balance task of repeated L LE lateral step in/out towards external target with mod assist for L LE management and mod assist for balance due to strong posterior lean and multimodal cuing with mirror feedback for improved LLE coordination. Stand pivot EOM>w/c using RW with min assist again primarily for increased anterior trunk lean upon coming to standing and balance while turning due to posterior lean - mod cuing for sequencing of AD and LE stepping. Transported back to room in w/c and pt left seated in w/c with needs in reach and seat belt alarm on.   Session 2: Patient received sitting in w/c and agreeable to therapy session.  Transported to/from gym in w/c for energy conservation and time management. Pt demonstrates ability to perform active ankle DF in sitting; however, during ambulation lacks motor control to activate it during swing phase despite cuing. Pt wearing L LE toe-up brace and therapist donned shoe cap. Gerald Stabs, orthotist, present for AFO consultation. Gait training ~56f using RW with min assist for balance and mod cuing for improved L LE foot clearance and step length - demonstrates toe drag during swing, decreased B LE step length (L LE more pronounced), decreased L hip/knee  flexion during swing. Gait training ~227fusing RW with min assist for balance without wearing L shoe cap and pt demonstrating intermittently improved L foot clearance that results in increased ankle inversion and hip adduction appearing to be a synergistic tone pattern. Therapist discussed with patient and ChGerald Stabshe concern of patient's bruising with prior AFO and orthotist recommended a very flexible AFO to decrease risk of bruising but also allows adequate ankle DF assist that is currently not being achieved with toe up brace - pt in agreement with this and plan to trial AFO over the weekend once available. Sit<>stands using RW with min assist for increased anterior trunk weight shift and lifting throughout session - cuing for proper sequencing of L hand placement on/off RW orthotic. Standing with B UE support on litegait donned harness with CGA for steadying. Stepped up/down on/off treadmill while in litegait harness with min/mod assist or balance.  Performed the following L LE NMR and gait training trials on the treadmill with partial body weight support via litegait harness and B UE support: 1st: forward ambulation for 75m64m4seconds at 0.4mp41motaling 84ft56fh max manual facilitation for L LE swing phase of gait and max multimodal cuing for increased L LE stance time with increased L hip/knee extension and L lateral weight shift; increased R LE step length, and increased L LE foot clearance during swing 2nd: forward ambulation for 75min330mconds at 0.5mph t27mling 113ft wi16fontinued max manual facilitation for L LE swing phase of gait and max multimodal cuing for the above gait impairments with pt demonstrating some improvements  Doffed litegait harness in standing with BUE support and CGA for steadying.  Gait training ~30ft ove61fund using RW with min assist  for balance with focus on improved L LE gait mechanics for carryover from treadmill with pt demonstrating increasing L LE foot clearance and  step length; however, still has ankle inversion with some hip adduction. Pt transported back to room in w/c and left seated in w/c with needs in reach and seat belt alarm on.  Therapy Documentation Precautions:  Precautions Precautions: Fall Precaution Comments: L hemiparesis Restrictions Weight Bearing Restrictions: No  Pain:    Session 1: Pt reports that her R shoulder becomes inrecasingly uncomfortable during standing tasks therefore removed RW to decreased R UE weightbearing while standing and provided seated rest breaks for pain management.  Session 2: Reports R shoulder becoming sore while ambulating on treadmill - provided rest break to decrease R UE weight bearing for pain management.   Therapy/Group: Individual Therapy  Tawana Scale, PT, DPT 08/15/2019, 7:55 AM

## 2019-08-16 ENCOUNTER — Inpatient Hospital Stay (HOSPITAL_COMMUNITY): Payer: PPO | Admitting: Occupational Therapy

## 2019-08-16 NOTE — Progress Notes (Signed)
[facility] PHYSICAL MEDICINE & REHABILITATION PROGRESS NOTE  Subjective/Complaints:  NO issues- wearing scotty dog sleeping pants- denies any problems.  ROS: Patient denies fever, rash, sore throat, blurred vision, nausea, vomiting, diarrhea, cough, shortness of breath or chest pain, joint or back pain, headache, or mood change.    Objective: Vital Signs: Blood pressure 116/60, pulse (!) 57, temperature (!) 97.5 F (36.4 C), temperature source Oral, resp. rate 16, height 5' (1.524 m), weight 63.9 kg, SpO2 94 %. No results found. Recent Labs    08/14/19 0510  WBC 25.8*  HGB 11.8*  HCT 37.5  PLT 125*   Recent Labs    08/14/19 0510 08/15/19 0608  NA 140  --   K 4.3  --   CL 104  --   CO2 26  --   GLUCOSE 91  --   BUN 20  --   CREATININE 0.50 0.53  CALCIUM 8.7*  --     Physical Exam: BP 116/60 (BP Location: Right Arm)   Pulse (!) 57   Temp (!) 97.5 F (36.4 C) (Oral)   Resp 16   Ht 5' (1.524 m)   Wt 63.9 kg   SpO2 94%   BMI 27.51 kg/m  Constitutional: No distress . Vital signs reviewed.sitting up in manual w/c at bedside; wearing scotty dog pants, NAD HEENT: EOMI, oral membranes moist Neck: supple Cardiovascular: RRR without murmur. No JVD    Respiratory: CTA Bilaterally without wheezes or rales. Normal effort    GI: BS +, non-tender, non-distended  Skin: Warm and dry.  Intact. Psych: Normal mood.  Normal behavior. Musc: Bilateral hallux valgus deformities, no pain with AROM right shoulder Neurological: Alert and oriented x 3 Follows simple commands.   HOH Motor:  Left upper extremity: Shoulder abduction, elbow flexion/extension 4 -/5, handgrip 3+-4 -/5 with apraxia--generally stable Left lower extremity: 4-/5 hip flexion, knee extension, 3/5 ankle dorsiflexion, unchanged No increase in tone seen.  Wearing foot up orthotic  Assessment/Plan: 1. Functional deficits secondary to right pontine infarct which require 3+ hours per day of interdisciplinary  therapy in a comprehensive inpatient rehab setting.  Physiatrist is providing close team supervision and 24 hour management of active medical problems listed below.  Physiatrist and rehab team continue to assess barriers to discharge/monitor patient progress toward functional and medical goals  Care Tool:  Bathing    Body parts bathed by patient: Left arm, Chest, Abdomen, Right upper leg, Left upper leg, Face, Front perineal area, Right lower leg, Left lower leg, Right arm, Buttocks   Body parts bathed by helper: Buttocks     Bathing assist Assist Level: Contact Guard/Touching assist     Upper Body Dressing/Undressing Upper body dressing   What is the patient wearing?: Pull over shirt    Upper body assist Assist Level: Moderate Assistance - Patient 50 - 74%    Lower Body Dressing/Undressing Lower body dressing      What is the patient wearing?: Pants, Underwear/pull up     Lower body assist Assist for lower body dressing: Moderate Assistance - Patient 50 - 74%     Toileting Toileting    Toileting assist Assist for toileting: Moderate Assistance - Patient 50 - 74%     Transfers Chair/bed transfer  Transfers assist     Chair/bed transfer assist level: Minimal Assistance - Patient > 75% Chair/bed transfer assistive device: Programmer, multimedia   Ambulation assist      Assist level: Minimal Assistance - Patient >  75% Assistive device: Walker-rolling Max distance: 47ft   Walk 10 feet activity   Assist     Assist level: Minimal Assistance - Patient > 75% Assistive device: Walker-rolling   Walk 50 feet activity   Assist Walk 50 feet with 2 turns activity did not occur: Safety/medical concerns  Assist level: Minimal Assistance - Patient > 75% Assistive device: Walker-rolling    Walk 150 feet activity   Assist Walk 150 feet activity did not occur: Safety/medical concerns  Assist level: 2 helpers Assistive device: Lite Gait     Walk 10 feet on uneven surface  activity   Assist Walk 10 feet on uneven surfaces activity did not occur: Safety/medical concerns         Wheelchair     Assist Will patient use wheelchair at discharge?: No(tbd)             Wheelchair 50 feet with 2 turns activity    Assist            Wheelchair 150 feet activity     Assist            Medical Problem List and Plan: 1.  Left-sided weakness secondary to small right pontine infarction secondary small vessel disease on 07/30/2019.  Continue CIR PT, OT 2.  Antithrombotics: -DVT/anticoagulation: Lovenox  Cr. WNL on 11/20             -antiplatelet therapy: Aspirin 81 mg daily and Plavix 75 mg daily x3 weeks then aspirin alone 3. Pain Management: Tylenol as needed  Baclofen nightly started on 11/15 for spasticity, increased to 20 on 11/17, increased to 30 on 11/20 with improvement. Seems to be tolerating well 4. Mood: Xanax 0.25 mg bid scheduled, in addition to q12 prn doses  Lexapro 5 daily started on 11/23, appears to be improving  Team to provide emotional support             -antipsychotic agents: N/A 5. Neuropsych: This patient is capable of making decisions on her own behalf. 6. Skin/Wound Care: Routine skin checks 7. Fluids/Electrolytes/Nutrition: Routine in and outs.    BMP within acceptable range on 11/26 8.  History of CLL.  Followed outpatient by Dr. Marko Plume.    Afebrile  WBCs 25.8 on 11/26  Continue to monitor 9.  Hypertension.  Patient on Lotrel 5-20 mg daily as needed prior to admission.  Controlled on 11/27             Monitor with increased mobility 10.  Thrombocytopenia             Platelets 125 on 11/26  Continue to monitor 11.  Sleep disturbance:   See #3,4  Chamomile tea ordered as well  Improving 12. Hypoalbuminemia   Supplement initiated on 11/17 13. Slow transit constipation  Bowel meds increased on 11/20  Large bm's 11/26 14.  Acute blood loss anemia  Hemoglobin  11.8 on 11/26  Continue to monitor  LOS: 15 days A FACE TO FACE EVALUATION WAS PERFORMED  Abigail Moreno 08/16/2019, 2:08 PM

## 2019-08-16 NOTE — Progress Notes (Signed)
Occupational Therapy Session Note  Patient Details  Name: Abigail Moreno MRN: [id number] Date of Birth: [birth year]  Today's Date: 08/16/2019 OT Individual Time: 1400-1530 OT Individual Time Calculation (min): 90 min    Short Term Goals: Week 1:  OT Short Term Goal 1 (Week 1): Pt will don shirt with contact guard without chair support OT Short Term Goal 1 - Progress (Week 1): Progressing toward goal OT Short Term Goal 2 (Week 1): Pt will perform sit to stand with min A consistently for tranfsers and clothing management OT Short Term Goal 2 - Progress (Week 1): Met OT Short Term Goal 3 (Week 1): Pt will performed toilet transfers with min A with LRAD OT Short Term Goal 3 - Progress (Week 1): Progressing toward goal OT Short Term Goal 4 (Week 1): Pt will perform one grooming tasks in standing with contact guard. OT Short Term Goal 4 - Progress (Week 1): Met Week 2:  OT Short Term Goal 1 (Week 2): Pt will be able to bathe RUE using LUE as an active A. OT Short Term Goal 1 - Progress (Week 2): Met OT Short Term Goal 2 (Week 2): Pt will be able to bathe feet with a long handled sponge. OT Short Term Goal 2 - Progress (Week 2): Met OT Short Term Goal 3 (Week 2): Pt will be able to don pullover shirt sitting on EOB with min A. OT Short Term Goal 3 - Progress (Week 2): Progressing toward goal OT Short Term Goal 4 (Week 2): Using AE as needed, pt will be able to thread pants over feet with min A. OT Short Term Goal 4 - Progress (Week 2): Met OT Short Term Goal 5 (Week 2): Pt will transfer to the toilet with min A. OT Short Term Goal 5 - Progress (Week 2): Met Week 3:  OT Short Term Goal 1 (Week 3): Pt will complete toilet transfer with CGA stand pivot. OT Short Term Goal 2 (Week 3): Pt will complete toileting with min A. OT Short Term Goal 3 (Week 3): Pt will complete LB dressing min A OT Short Term Goal 4 (Week 3): Pt will complete UB dressing min A. OT Short Term Goal 5 (Week 3): Pt  will stand with CGA as she pulls pants over hips with min A.  Skilled Therapeutic Interventions/Progress Updates:    Pt received in room with daughter present.  Began family education with daughter. Discussed width of shower door at home to determine if tub bench could fit to allow her to do a sit pivot.  Pt worked on undressing and dressing with daughter observing hemi dressing techniques.  Pt worked on sit to stand (with R AFO ) on and was able to do so at least 8x during session with CGA. Stood with CGA for pulling pants up and working on standing FMC/ reaching exercises.   Practiced ambulation with RW for 15 ft 2x with CGA. Pt taking slower steps to prevent L hip adduction/ scissoring.  estim placed on L shoulder for 45 min. During that time pt worked on [employer] with pushing/pulling and rotation exercises.  Pt practiced placing blocks on table with min A to reach arm to 60 degrees of flexion.  [place] of rolling and pinching theraputty and placing lego blocks together.  Excellent participation. Pt resting in w/c with all needs met, belt alarm on.      Therapy Documentation Precautions:  Precautions Precautions: Fall Precaution Comments: L hemiparesis Restrictions Weight Bearing Restrictions:  No    Vital Signs: Therapy Vitals Temp: (!) 97.5 F (36.4 C) Temp Source: Oral Pulse Rate: (!) 57 Resp: 16 BP: 116/60 Patient Position (if appropriate): Sitting Oxygen Therapy SpO2: 94 % O2 Device: Room Air   Pain: no c/o pain    ADL: ADL Grooming: Setup Where Assessed-Grooming: Sitting at sink Upper Body Bathing: Supervision/safety Where Assessed-Upper Body Bathing: Shower Lower Body Bathing: Contact guard Where Assessed-Lower Body Bathing: Shower Upper Body Dressing: Moderate assistance Where Assessed-Upper Body Dressing: Edge of bed Lower Body Dressing: Moderate assistance Where Assessed-Lower Body Dressing: Edge of bed Toileting: Moderate assistance Where Assessed-Toileting:  Glass blower/designer: Psychiatric nurse Method: Arts development officer: Energy manager: Environmental education officer Method: Radiographer, therapeutic: Grab bars, Transfer tub bench   Therapy/Group: Individual Therapy  [place] 08/16/2019, 4:29 PM

## 2019-08-17 ENCOUNTER — Inpatient Hospital Stay (HOSPITAL_COMMUNITY): Payer: PPO | Admitting: Physical Therapy

## 2019-08-17 ENCOUNTER — Inpatient Hospital Stay (HOSPITAL_COMMUNITY): Payer: PPO

## 2019-08-17 NOTE — Progress Notes (Signed)
[place] PHYSICAL MEDICINE & REHABILITATION PROGRESS NOTE  Subjective/Complaints:  Pt asking for tylenol for shoulder pain- otherwise, no issues.  ROS: Patient denies fever, rash, sore throat, blurred vision, nausea, vomiting, diarrhea, cough, shortness of breath or chest pain, joint or back pain, headache, or mood change.    Objective: Vital Signs: Blood pressure 122/64, pulse 62, temperature (!) 97.5 F (36.4 C), temperature source Oral, resp. rate 16, height 5' (1.524 m), weight 63.9 kg, SpO2 95 %. No results found. No results for input(s): WBC, HGB, HCT, PLT in the last 72 hours. Recent Labs    08/15/19 0608  CREATININE 0.53    Physical Exam: BP 122/64 (BP Location: Right Arm)   Pulse 62   Temp (!) 97.5 F (36.4 C) (Oral)   Resp 16   Ht 5' (1.524 m)   Wt 63.9 kg   SpO2 95%   BMI 27.51 kg/m  Constitutional: No distress . Vital signs reviewed.sitting up in manual w/c at bedside; wearing PJs;  NAD HEENT: EOMI, oral membranes moist Neck: supple Cardiovascular: RRR without murmur. No JVD    Respiratory: CTA Bilaterally without wheezes or rales. Normal effort    GI: BS +, non-tender, non-distended  Skin: Warm and dry.  Intact. Psych: Normal mood.  Normal behavior. Musc: Bilateral hallux valgus deformities, no pain with AROM right shoulder Neurological: Alert and oriented x 3 Follows simple commands.   HOH Motor:  Left upper extremity: Shoulder abduction, elbow flexion/extension 4 -/5, handgrip 3+-4 -/5 with apraxia--generally stable Left lower extremity: 4-/5 hip flexion, knee extension, 3/5 ankle dorsiflexion, unchanged No increase in tone seen.  Wearing foot up orthotic  Assessment/Plan: 1. Functional deficits secondary to right pontine infarct which require 3+ hours per day of interdisciplinary therapy in a comprehensive inpatient rehab setting.  Physiatrist is providing close team supervision and 24 hour management of active medical problems listed  below.  Physiatrist and rehab team continue to assess barriers to discharge/monitor patient progress toward functional and medical goals  Care Tool:  Bathing    Body parts bathed by patient: Left arm, Chest, Abdomen, Right upper leg, Left upper leg, Face, Front perineal area, Right lower leg, Left lower leg, Right arm, Buttocks   Body parts bathed by helper: Buttocks     Bathing assist Assist Level: Contact Guard/Touching assist     Upper Body Dressing/Undressing Upper body dressing   What is the patient wearing?: Pull over shirt    Upper body assist Assist Level: Moderate Assistance - Patient 50 - 74%    Lower Body Dressing/Undressing Lower body dressing      What is the patient wearing?: Pants, Underwear/pull up     Lower body assist Assist for lower body dressing: Moderate Assistance - Patient 50 - 74%     Toileting Toileting    Toileting assist Assist for toileting: Moderate Assistance - Patient 50 - 74%     Transfers Chair/bed transfer  Transfers assist     Chair/bed transfer assist level: Moderate Assistance - Patient 50 - 74% Chair/bed transfer assistive device: Museum/gallery exhibitions officer assist      Assist level: Minimal Assistance - Patient > 75% Assistive device: Walker-rolling Max distance: 76ft   Walk 10 feet activity   Assist     Assist level: Minimal Assistance - Patient > 75% Assistive device: Walker-rolling   Walk 50 feet activity   Assist Walk 50 feet with 2 turns activity did not occur: Safety/medical concerns  Assist level:  Minimal Assistance - Patient > 75% Assistive device: Walker-rolling    Walk 150 feet activity   Assist Walk 150 feet activity did not occur: Safety/medical concerns  Assist level: 2 helpers Assistive device: Lite Gait    Walk 10 feet on uneven surface  activity   Assist Walk 10 feet on uneven surfaces activity did not occur: Safety/medical concerns          Wheelchair     Assist Will patient use wheelchair at discharge?: No(tbd)             Wheelchair 50 feet with 2 turns activity    Assist            Wheelchair 150 feet activity     Assist            Medical Problem List and Plan: 1.  Left-sided weakness secondary to small right pontine infarction secondary small vessel disease on 07/30/2019.  Continue CIR PT, OT 2.  Antithrombotics: -DVT/anticoagulation: Lovenox  Cr. WNL on 11/20             -antiplatelet therapy: Aspirin 81 mg daily and Plavix 75 mg daily x3 weeks then aspirin alone 3. Pain Management: Tylenol as needed  Baclofen nightly started on 11/15 for spasticity, increased to 20 on 11/17, increased to 30 on 11/20 with improvement. Seems to be tolerating well 4. Mood: Xanax 0.25 mg bid scheduled, in addition to q12 prn doses  Lexapro 5 daily started on 11/23, appears to be improving  Team to provide emotional support             -antipsychotic agents: N/A 5. Neuropsych: This patient is capable of making decisions on her own behalf. 6. Skin/Wound Care: Routine skin checks 7. Fluids/Electrolytes/Nutrition: Routine in and outs.    BMP within acceptable range on 11/26 8.  History of CLL.  Followed outpatient by Dr. Marko Plume.    Afebrile  WBCs 25.8 on 11/26  Continue to monitor 9.  Hypertension.  Patient on Lotrel 5-20 mg daily as needed prior to admission.  Controlled on 11/27             Monitor with increased mobility 10.  Thrombocytopenia             Platelets 125 on 11/26  Continue to monitor 11.  Sleep disturbance:   See #3,4  Chamomile tea ordered as well  Improving 12. Hypoalbuminemia   Supplement initiated on 11/17 13. Slow transit constipation  Bowel meds increased on 11/20  Large bm's 11/26 14.  Acute blood loss anemia  Hemoglobin 11.8 on 11/26  Continue to monitor  LOS: 16 days A FACE TO FACE EVALUATION WAS PERFORMED  Krystalyn Kubota 08/17/2019, 12:57 PM

## 2019-08-17 NOTE — Progress Notes (Signed)
Physical Therapy Session Note  Patient Details  Name: Abigail Moreno MRN: [id number] Date of Birth: [birth year]  Today's Date: 08/17/2019 PT Individual Time: 1007-1109 and [id number] PT Individual Time Calculation (min): 62 min and 60 min  Short Term Goals: Week 3:  PT Short Term Goal 1 (Week 3): Patient will perform functional transfers using LRAD with CGA PT Short Term Goal 2 (Week 3): Pt will ambulate at least 60ft using LRAD with CGA PT Short Term Goal 3 (Week 3): Pt will ascend/descend 4 steps using L handrail with min assist  Skilled Therapeutic Interventions/Progress Updates:    Session 1: Pt received asleep, sitting in w/c and upon awakening agreeable to therapy session - reports feeling tired but did rest better last night. Donned B LE TED hose and shoes and L LE AFO - assessed skin prior to donning AFO for comparison at end of session.  Transported to/from gym in w/c for time management and energy conservation. Sit<>stands using RW with min assist for lifting  into standing and for increased anterior trunk weight shift - continued mod cuing for proper L UE hand placement on/off RW orthotic and for increased anterior trunk weight shift to come to standing. Ambulated 10ft x2 using RW with min assist/CGA for balance/steadying and mod cuing for increased L LE foot clearance and step length with max cuing for proper AD management and LE stepping when turning around or turning to sit in w/c - demonstrates increased L LE ankle DF clearance with AFO compared to with toe-up brace. Pt continues to report increasing R shoulder discomfort due to pushing down through AD while ambulating. Ambulated ~62ft x2 without AD and min/mod assist for balance (especially mod assist when turning to sit in chair) while therapist provided manual facilitation for increased R/L lateral weight shifting onto stance limb to promote increased foot clearance during swing and mod verbal cuing for increased L LE foot  clearance and step length. Ambulated 84ft forward/backwrds using RW with min assist for balance and mod cuing for increased step length bilaterally when stepping backwards with pt demonstrating improvement - targeting increased balance when turning to sit in a chair and having to take backwards steps. Transported back to room in w/c. Assessed skin of L LE from wearing AFO during session with no new redness or bruising noted (pt has had some bruising along L lateral lower leg for over a week) - pt reports no discomfort or pain while wearing brace. Pt left seated in w/c with needs in reach and seat belt alarm.   Session 2: Pt received sitting in w/c talking on the phone with her daughter and pt agreeable to therapy session.  Transported to/from gym in w/c. Pt wearing L LE AFO. Sit<>stands using RW with min assist for lifting and pt demonstrating improving recall to perform more anterior trunk flexion to increase ability to come to standing. Gait training 70ft x2 (seated break between to practice turning and sitting in chair) using RW with min assist for balance and pt demonstrating improving gait pattern with increased L LE foot clearance (though still intermittent toe drag), increased B LE step length (though decreased R LE step length due to decreased L stance time) with improvement towards reciprocal gait pattern. When turning to sit in chair pt demonstrating increased recall and carryover of education/cuing for turning fully to feel chair behind her, increased L LE backwards step length for improved balance, unstrapping L hand from RW orthotic, and reaching back with L hand to assist  with sitting. Gait training 81ft using RW with min assist and continued cuing for above gait impairments. Ascended/descended 4 steps using BUE support on L HR with side-step pattern x2 (seated break between) with mod assist for lifting on ascent and for L hip abduction to place foot on descent - cuing for sequencing and increased R  LE hip/knee extension to step-up as well as increased L hip/knee flexion to bring foot up to next step. Gait training ~74ft using RW through cone weaving with min assist for balance and pt demonstrating significantly increased difficulty clearing L food during swing while also concerned with turning RW to navigate around the obstacles - required significantly increased time to complete the task and mod/max cuing for improved L foot clearance and AD management. Gait training ~49ft without AD and min/mod assist for balance with manual facilitation for lateral weight shifting onto stance limb to promote increased swing phase step length. Transported back to room in w/c and pt left seated in w/c with needs in reach, seat belt alarm on, and meal tray set-up.  Therapy Documentation Precautions:  Precautions Precautions: Fall Precaution Comments: L hemiparesis Restrictions Weight Bearing Restrictions: No  Pain:   Session 1: Reports R shoulder rotator cuff is bothering her "a little" and reports premedicating.   Session 2: Reports R shoulder rotator cuff pain that increased with stair navigation and prolonged walking with RW due to increased weight bearing and tension in that arm when performing these challenging dynamic stepping tasks - provided rest breaks and repositioning for pain management.    Therapy/Group: Individual Therapy  Tawana Scale, PT, DPT 08/17/2019, 7:52 AM

## 2019-08-17 NOTE — Progress Notes (Signed)
Physical Therapy Session Note  Patient Details  Name: Abigail Moreno MRN: [id number] Date of Birth: [birth year]  Today's Date: 08/17/2019 PT Individual Time: 1250-1348 PT Individual Time Calculation (min): 58 min   Short Term Goals: Week 3:  PT Short Term Goal 1 (Week 3): Patient will perform functional transfers using LRAD with CGA PT Short Term Goal 2 (Week 3): Pt will ambulate at least 64ft using LRAD with CGA PT Short Term Goal 3 (Week 3): Pt will ascend/descend 4 steps using L handrail with min assist  Skilled Therapeutic Interventions/Progress Updates:    Pt seated in w/c upon PT arrival, agreeable to therapy tx and denies pain. Pt transported to the gym. Pt worked on gait training this session with emphasis on proper sequencing with minimal cues from therapist- pt able to ambulate 2 x 30 ft, x 60 ft with min assist RW and L AFO, pt with good carry over for sequencing with sit<>stands and turns this session. Pt worked on IT trainer this session- ascended/descended 4 steps this session using L handrail and with mod assist, cues for foot placement, techniques and hand placement on rail, when descending assist for L LE placement to limit adduction. Pt worked on dynamic standing balance and hip strengthening to perform side steps in place 2 x 10 per LE, B UE support holding onto therapist's elbows. Pt performed x 10 sit<>stands from edge of mat without AD and pushing up from mat with UEs, cues for techniques and increased anterior weightshift, CGA-min assist. Pt performed supine<>sitting on the mat this session with min-mod assist, cues for techniques. In supine pt performed LE exercises for neuro re-ed and strengthening, 2 x 10 each: active assisted L hip flexion, bridges, isometric L hip abduction against manual resistance. Pt transferred back to w/c and transported back to room, left in w/c with needs in reach and chair alarm set.   Therapy Documentation Precautions:   Precautions Precautions: Fall Precaution Comments: L hemiparesis Restrictions Weight Bearing Restrictions: No    Therapy/Group: Individual Therapy  Netta Corrigan, PT, DPT, CSRS 08/17/2019, 7:46 AM

## 2019-08-18 ENCOUNTER — Inpatient Hospital Stay (HOSPITAL_COMMUNITY): Payer: PPO | Admitting: Occupational Therapy

## 2019-08-18 ENCOUNTER — Inpatient Hospital Stay (HOSPITAL_COMMUNITY): Payer: PPO

## 2019-08-18 MED ORDER — NON FORMULARY: 1.0000 | Freq: Two times a day (BID) | Status: DC

## 2019-08-18 MED ORDER — PRESERVISION AREDS 2 PO CAPS
1.0000 | ORAL_CAPSULE | Freq: Two times a day (BID) | ORAL | Status: DC
  Administered 2019-08-18 – 2019-08-19 (×2): 1 via ORAL
  Administered 2019-08-19 – 2019-08-20 (×2): via ORAL
  Administered 2019-08-20 – 2019-08-27 (×14): 1 via ORAL
  Filled 2019-08-18 (×18): qty 1

## 2019-08-18 NOTE — Progress Notes (Signed)
Occupational Therapy Session Note  Patient Details  Name: Abigail Moreno MRN: [id number] Date of Birth: [birth year]  Today's Date: 08/18/2019 OT Individual Time: 2233-6122 OT Individual Time Calculation (min): 75 min    Short Term Goals:    Week 3:  OT Short Term Goal 1 (Week 3): Pt will complete toilet transfer with CGA stand pivot. OT Short Term Goal 2 (Week 3): Pt will complete toileting with min A. OT Short Term Goal 3 (Week 3): Pt will complete LB dressing min A OT Short Term Goal 4 (Week 3): Pt will complete UB dressing min A. OT Short Term Goal 5 (Week 3): Pt will stand with CGA as she pulls pants over   Skilled Therapeutic Interventions/Progress Updates:    Pt received in w/c agreeable to therapy and requesting to shower. From w/c pt completed oral care and picked out clothing from dresser drawers.   used wc to stand pivot to tub bench with CGA and grab bars.  From bench, bathed with supervision for all body parts except  CGA when standing to wash bottom.  Improved L arm strength to reach to wash R arm.  Dressed from w/c continuing to need A due to L side weakness and R shoulder limitations.  Sit and stand balance significantly improved as she was able to reach forward to floor from sit with CGA and also stand with CGA to pull underwear up with mod A due to tightness of clothing but loose pants with S.   Pt able to tolerate standing with close S and hands on RW to talk with MD for several minutes.  estim placed on L deltoid and pt worked on active pushing on a dowel bar and then estim left on in place for 45 min of unattended estim using Saebo stim go. Pt tolerated estim well.   Saebo Stim Go 330 pulse width 35 Hz pulse rate On 8 sec/ off 8 sec Ramp up/ down 2 sec Symmetrical Biphasic wave form  Max intensity 117m at 500 Ohm load  Pt worked with yellow theraband on R shoulder scapular retraction exercises for her RTC injury.  Will continue to introduce R shoulder  exercises.   Pt resting in chair with belt alarm on and all needs met.    Therapy Documentation Precautions:  Precautions Precautions: Fall Precaution Comments: L hemiparesis Restrictions Weight Bearing Restrictions: No    Vital Signs: Therapy Vitals Temp: 97.7 F (36.5 C) Temp Source: Oral Pulse Rate: 66 Resp: 19 BP: 134/71 Patient Position (if appropriate): Lying Oxygen Therapy SpO2: 94 % O2 Device: Room Air Pain: Pain Assessment Pain Scale: 0-10 Pain Score: 2  Pain Type: Chronic pain Pain Location: Shoulder Pain Orientation: Right Pain Descriptors / Indicators: Aching;Discomfort;Dull Pain Frequency: Intermittent Pain Onset: With Activity Pain Intervention(s): Medication (See eMAR) ADL: ADL Grooming: Setup Where Assessed-Grooming: Sitting at sink Upper Body Bathing: Supervision/safety Where Assessed-Upper Body Bathing: Shower Lower Body Bathing: Contact guard Where Assessed-Lower Body Bathing: Shower Upper Body Dressing: Moderate assistance Where Assessed-Upper Body Dressing: Edge of bed Lower Body Dressing: Moderate assistance Where Assessed-Lower Body Dressing: Edge of bed Toileting: Moderate assistance Where Assessed-Toileting: TGlass blower/designer MPsychiatric nurseMethod: SArts development officer GEnergy manager MEnvironmental education officerMethod: SRadiographer, therapeutic Grab bars, Transfer tub bench      Therapy/Group: Individual Therapy  SRichland11/30/2020, 8:32 AM

## 2019-08-18 NOTE — Progress Notes (Addendum)
[place] PHYSICAL MEDICINE & REHABILITATION PROGRESS NOTE  Subjective/Complaints: Standing up with OT, getting dressed. Right shoulder stiff per baseline. No new issues over weekend.   ROS: Patient denies fever, rash, sore throat, blurred vision, nausea, vomiting, diarrhea, cough, shortness of breath or chest pain, headache, or mood change.     Objective: Vital Signs: Blood pressure 134/71, pulse 66, temperature 97.7 F (36.5 C), temperature source Oral, resp. rate 19, height 5' (1.524 m), weight 63.9 kg, SpO2 94 %. No results found. No results for input(s): WBC, HGB, HCT, PLT in the last 72 hours. No results for input(s): NA, K, CL, CO2, GLUCOSE, BUN, CREATININE, CALCIUM in the last 72 hours.  Physical Exam: BP 134/71 (BP Location: Left Arm)   Pulse 66   Temp 97.7 F (36.5 C) (Oral)   Resp 19   Ht 5' (1.524 m)   Wt 63.9 kg   SpO2 94%   BMI 27.51 kg/m  Constitutional: No distress . Vital signs reviewed. HEENT: EOMI, oral membranes moist Neck: supple Cardiovascular: RRR without murmur. No JVD    Respiratory: CTA Bilaterally without wheezes or rales. Normal effort    GI: BS +, non-tender, non-distended  Skin: Warm and dry.  Intact. Psych: Normal mood.  Normal behavior. Musc: Bilateral hallux valgus deformities, right shoulder with limitations in rotation, able to perform ADL's without substantial pain Neurological: Alert and oriented x 3 Follows simple commands.   HOH Motor:  Left upper extremity: Shoulder abduction, elbow flexion/extension 4 -/5, handgrip 3+-4 -/5 with improving apraxia Left lower extremity: 4-/5 hip flexion, knee extension, 3/5 ankle dorsiflexion--stable exam.     Assessment/Plan: 1. Functional deficits secondary to right pontine infarct which require 3+ hours per day of interdisciplinary therapy in a comprehensive inpatient rehab setting.  Physiatrist is providing close team supervision and 24 hour management of active medical problems listed  below.  Physiatrist and rehab team continue to assess barriers to discharge/monitor patient progress toward functional and medical goals  Care Tool:  Bathing    Body parts bathed by patient: Left arm, Chest, Abdomen, Right upper leg, Left upper leg, Face, Front perineal area, Right lower leg, Left lower leg, Right arm, Buttocks   Body parts bathed by helper: Buttocks     Bathing assist Assist Level: Contact Guard/Touching assist     Upper Body Dressing/Undressing Upper body dressing   What is the patient wearing?: Pull over shirt    Upper body assist Assist Level: Minimal Assistance - Patient > 75%    Lower Body Dressing/Undressing Lower body dressing      What is the patient wearing?: Pants, Underwear/pull up     Lower body assist Assist for lower body dressing: Moderate Assistance - Patient 50 - 74%     Toileting Toileting    Toileting assist Assist for toileting: Moderate Assistance - Patient 50 - 74%     Transfers Chair/bed transfer  Transfers assist     Chair/bed transfer assist level: Minimal Assistance - Patient > 75% Chair/bed transfer assistive device: Programmer, multimedia   Ambulation assist      Assist level: Minimal Assistance - Patient > 75% Assistive device: Walker-rolling Max distance: 57ft   Walk 10 feet activity   Assist     Assist level: Minimal Assistance - Patient > 75% Assistive device: Walker-rolling   Walk 50 feet activity   Assist Walk 50 feet with 2 turns activity did not occur: Safety/medical concerns  Assist level: Minimal Assistance - Patient > 75%  Assistive device: Walker-rolling    Walk 150 feet activity   Assist Walk 150 feet activity did not occur: Safety/medical concerns  Assist level: 2 helpers Assistive device: Lite Gait    Walk 10 feet on uneven surface  activity   Assist Walk 10 feet on uneven surfaces activity did not occur: Safety/medical concerns          Wheelchair     Assist Will patient use wheelchair at discharge?: No(tbd)             Wheelchair 50 feet with 2 turns activity    Assist            Wheelchair 150 feet activity     Assist            Medical Problem List and Plan: 1.  Left-sided weakness secondary to small right pontine infarction secondary small vessel disease on 07/30/2019.  Continue CIR PT, OT---trying to reach close to mod I goals  11/30 asked OT to provide her a few right shoulder mob exercises that she can do on her own. 2.  Antithrombotics: -DVT/anticoagulation: Lovenox  Cr. WNL on 11/20             -antiplatelet therapy: Aspirin 81 mg daily and Plavix 75 mg daily x3 weeks then aspirin alone 3. Pain Management: Tylenol as needed for right shoulder pain  Baclofen nightly started on 11/15 for spasticity, increased to 20 on 11/17, increased to 30 on 11/20 with improvement. Tolerating well 4. Mood: Xanax 0.25 mg bid scheduled, in addition to q12 prn doses  Lexapro 5 daily started on 11/23, appears improved---positive outlook presently  Team to provide emotional support             -antipsychotic agents: N/A 5. Neuropsych: This patient is capable of making decisions on her own behalf. 6. Skin/Wound Care: Routine skin checks 7. Fluids/Electrolytes/Nutrition: Routine in and outs.    BMP within acceptable range on 11/26 8.  History of CLL.  Followed outpatient by Dr. Marko Plume.    Afebrile  WBCs 25.8 on 11/26  Recheck Wednesday 9.  Hypertension.  Patient on Lotrel 5-20 mg daily as needed prior to admission.  Controlled on 11/27             Monitor with increased mobility 10.  Thrombocytopenia             Platelets 125 on 11/26  Recheck Wednesday 11.  Sleep disturbance:   See #3,4  Chamomile tea ordered as well  Improving 12. Hypoalbuminemia   Supplement initiated on 11/17 13. Slow transit constipation  Bowel meds increased on 11/20  Large bm 1130 14.  Acute blood loss  anemia  Hemoglobin 11.8 on 11/26  Recheck Wednesday  LOS: 17 days A FACE TO FACE EVALUATION WAS PERFORMED  Meredith Staggers 08/18/2019, 11:04 AM

## 2019-08-18 NOTE — Progress Notes (Addendum)
Physical Therapy Session Note  Patient Details  Name: Abigail Moreno MRN: [id number] Date of Birth: 1929-09-16  Today's Date: 08/18/2019 PT Individual Time: [id number]; [id number] PT Individual Time Calculation (min): 78 min , 38 min  Short Term Goals:  Week 3:  PT Short Term Goal 1 (Week 3): Patient will perform functional transfers using LRAD with CGA PT Short Term Goal 2 (Week 3): Pt will ambulate at least 70ft using LRAD with CGA PT Short Term Goal 3 (Week 3): Pt will ascend/descend 4 steps using L handrail with min assist  Skilled Therapeutic Interventions/Progress Updates:  tx 1:  Pt asleep in w/c, but easily awakned.  She stated that she did not sleep well overnight, but denied pain.  neuromuscular re-education via forced use for sustained stretch L hamstrings in sitting, with use of footstool.  PT educated pt on self stretching LLE after d/c.  She stated that her dtr has a footstool.  In sitting with upright posture, bil scapular retraction. Use of bil LEs to propel w/c with min assist x 25' straight trajectory.   Transfer training blocked practice for trunk and cervical flexion for anterior wt shift, and reciprocal scooting forward/backward in w/c.  PT educated pt continually about sequencing of transfer including scooting forward, placing L foot correctly under knee, and leaning forward.  PT placed a 3x5 card with numbered sequence for transfers, and taped it to 1/2 lap tray.  PT admitted that she has a hard time remembering new information.   PT donned LAFO..  Gait training with RW on level tile x 45' with min assist.  Cues for wider BOS during turns. L foot clearance fair.  Distance limited by R rotator cuff pain.  Pt reported that she usually takes Tylenol in the morning, but did not today.  Up/down (4) 6" high steps, bil hands on L rail, turned sideways.  Max assist to ascend with over-reliance on RUE, mod assist to descend.  Step to method.  When ascending, pt tends to  attempt this with L hip adducted resulting in poor hip/knee/ankle alignment.  Stair training with 5" high curb/step to focus on RLE alignment.  Pt stated that her dtr's other entrance has 1+1 steps to enter from patio.  PT asked pt to have dtr take pics of this entrance and send to pt; she may be more successful using this entrance with assistance and RW.  At end of session, pt in w/c with needs at hand and seat belt alarm set.  tx 2: Pt seated in w/c.  No c/o pain; wearing LAFO.    Up/down 4" curb/step with RW, min assist and max cues.  PT again discussed need for her dtr to take pictures of secondary entrance to her home; pt stated that her dtr would not be visiting until tomorrow.  PT requested that she have dtr take pics ASAP and send to pt's Ipad. PT will inform Jacqlyn Larsen, CSW.   Skin check; pt has been wearing LAFO x 2 hours.  Diffuse old and new bruising noted L LL, proximo-lateral, also lateral foot.  Brooke Pace here for AFO consult.  He stated that bruising looked different from the time it happened when pt was using CIR PLS AFO. Pt reported that she is bruising due to chronic anemia and now receiving blood thinners.  Gerald Stabs took shoe to add toe cap.  He will consult with primary PT to assess which type/ custom or OTC AFO to pursue.   At end of session, pt  seated in w/c.  PT doffed bil shoes and L AFO and donned non slip socks.      Therapy Documentation Precautions:  Precautions Precautions: Fall Precaution Comments: L hemiparesis Restrictions Weight Bearing Restrictions: No           Therapy/Group: Individual Therapy  Idabell Picking 08/18/2019, 12:10 PM

## 2019-08-19 ENCOUNTER — Inpatient Hospital Stay (HOSPITAL_COMMUNITY): Payer: PPO | Admitting: Occupational Therapy

## 2019-08-19 ENCOUNTER — Inpatient Hospital Stay (HOSPITAL_COMMUNITY): Payer: PPO | Admitting: Physical Therapy

## 2019-08-19 DIAGNOSIS — H3530 Unspecified macular degeneration: Secondary | ICD-10-CM

## 2019-08-19 MED ORDER — HOME MED STORE IN PYXIS: 1.0000 | Freq: Two times a day (BID) | Status: DC

## 2019-08-19 MED ORDER — PRESERVISION AREDS 2 PO CAPS: 1.0000 | ORAL_CAPSULE | Freq: Two times a day (BID) | ORAL | Status: DC

## 2019-08-19 NOTE — Progress Notes (Signed)
Occupational Therapy Session Note  Patient Details  Name: Abigail Moreno MRN: [id number] Date of Birth: Dec 22, 1928  Today's Date: 08/19/2019 OT Individual Time: [id number] OT Individual Time Calculation (min): 55 min    Short Term Goals: Week 3:  OT Short Term Goal 1 (Week 3): Pt will complete toilet transfer with CGA stand pivot. OT Short Term Goal 2 (Week 3): Pt will complete toileting with min A. OT Short Term Goal 3 (Week 3): Pt will complete LB dressing min A OT Short Term Goal 4 (Week 3): Pt will complete UB dressing min A. OT Short Term Goal 5 (Week 3): Pt will stand with CGA as she pulls pants over hips with min A.  Skilled Therapeutic Interventions/Progress Updates:    Pt completed transfer from the wheelchair to the therapy mat.  She worked on Research scientist (life sciences) during session.  She worked on sitting and in supine with use of a dowel rod and overall min assist to complete shoulder flexion for sets of 5-10 reps.  She needed min assist to maintain grip at times as well as for maintaining left elbow extension.  She transitioned to functional reach, picking up 1" x 3" foam blocks with the LUE from the therapy mat and placing them in a box at knee level.  Min facilitation needed for opening at times as well as well as for elbow extension.  Also worked on transitions from left forearm to sitting with activation of the elbow extensors and min assist.  Finished session with call button and phone in reach and pt back in the room.  Safety belt in place as well.    Therapy Documentation Precautions:  Precautions Precautions: Fall Precaution Comments: L hemiparesis Restrictions Weight Bearing Restrictions: No   Pain: Pain Assessment Pain Scale: Faces Pain Score: 0-No pain ADL: See Care Tool Section for some details of selfcare and mobility  Therapy/Group: Individual Therapy  Aizen Duval OTR/L 08/19/2019, 12:46 PM

## 2019-08-19 NOTE — Progress Notes (Signed)
Physical Therapy Session Note  Patient Details  Name: Abigail Moreno MRN: [id number] Date of Birth: 01/30/1929  Today's Date: 08/19/2019 PT Individual Time: [id number] PT Individual Time Calculation (min): 81 min   Short Term Goals: Week 3:  PT Short Term Goal 1 (Week 3): Patient will perform functional transfers using LRAD with CGA PT Short Term Goal 2 (Week 3): Pt will ambulate at least 93ft using LRAD with CGA PT Short Term Goal 3 (Week 3): Pt will ascend/descend 4 steps using L handrail with min assist  Skilled Therapeutic Interventions/Progress Updates:    Pt received sitting in w/c with her daughter present and pt agreeable to therapy session and daughter agreeable to initiate family education. Pt's daughter present throughout session for education but did not perform hands-on training at this time. Therapist discussed concerns of L LE bruising and skin sensitivity regarding use of AFO - therapist recommended pt only wear AFO for ambulatory tasks and then to doff it at all other times as well as continue to monitor for bruising/redness. Therapist educated pt's daughter on pt's need to wear modified tennis shoes with leather toe cap, L AFO, and use RW with L hand orthosis for all standing/ambulatory tasks - as well as proper use of gait belt. Therapist educated her on pt's need for 24/7 support at home and pt's current assistance level. Transported to/from gym in w/c. Sit<>stands using RW with min assist for lifting during session with continued cuing for sequencing of L hand on/off RW orthotic. Gait training ~53ft using RW with min assist for balance and mod cuing for increased L LE foot clearance and step length. Pt reports having incontinent bladder episode and transferred back to room in w/c. Stand pivot to/from toilet using grab bars with min assist for balance and mod assist for LB clothing management due to pt's inability to pull pants down on the L due to L UE weakness. While sitting  on toilet donned new pants and underwear with max assist for time management. Pt continent of urine and performed seated peri-care without assistance. Transported back to therapy gym. Gait training ~10ft using RW with min assist for balance and again mod cuing for improved L LE foot clearance with pt demonstrating slight L foot internal rotation during swing and improving L LE foot clearance - +2 w/c follow to allow increased ambulation distance. Ascended/descended 4 steps x2 (seated break between) with B UE support on L handrail using lateral step-to pattern to replicate home environment with mod assist for lifting up onto step during ascent and min assist for placement of L LE into more hip abduction during descent. Therapist educated pt's daughter on pt's current assistance level and application of this technique to their home stairs - pt's daughter reports that the pt's son can provide the physical assistance for the stairs as she feels the other entrances into her home are more challenging to navigate than the steps from the garage. Daughter reports she and the pt's son are planning to return on Thursday for hands-on training during therapy sessions. Pt transported back to room in w/c. Stand pivot w/c>recliner using RW with min assist primarily for lifting into standing and CGA for steadying. Therapist doffed L LE AFO. Pt left seated in recliner with needs in reach, seat belt alarm on, and B LEs elevated.   Therapy Documentation Precautions:  Precautions Precautions: Fall Precaution Comments: L hemiparesis Restrictions Weight Bearing Restrictions: No  Pain: Reports some R shoulder pain during ambulation - provided rest  breaks to decrease weightbearing down through that arm for pain management.    Therapy/Group: Individual Therapy  Tawana Scale, PT, DPT 08/19/2019, 12:48 PM

## 2019-08-19 NOTE — Progress Notes (Signed)
Occupational Therapy Session Note  Patient Details  Name: Abigail Moreno MRN: [id number] Date of Birth: 06-Oct-1928  Today's Date: 08/19/2019 OT Individual Time: [id number] OT Individual Time Calculation (min): 60 min    Short Term Goals: Week 3:  OT Short Term Goal 1 (Week 3): Pt will complete toilet transfer with CGA stand pivot. OT Short Term Goal 2 (Week 3): Pt will complete toileting with min A. OT Short Term Goal 3 (Week 3): Pt will complete LB dressing min A OT Short Term Goal 4 (Week 3): Pt will complete UB dressing min A. OT Short Term Goal 5 (Week 3): Pt will stand with CGA as she pulls pants over hips with min A.  Skilled Therapeutic Interventions/Progress Updates:   ADL Retraining: Pt declined bathing and wanted to keep wearing same clothing from yesterday.  She completed grooming at sink with set up and cues for how to open and close toothpaste using L hand as an active A.    Transfers: min A stand pivot to and from w/c to mat and min A sit to stand, pt stated she was very tired today and not as energetic    Neuromuscular Re-Education: Sitting on therapy mat: L shoulder AROM with sliding arm forward and back on tray table and elbow flexion/ extension for gravity  elimated AROM; bimanual task of using B hands on rolling pin and rolling pin on tops of thighs and then on table top. Pt able to maintain grasp on pin handle.   L finger tip pinch with grasping checkers and dropping them into a connect four game. She has difficulty releasing tip pinch due to limited finger and thumb extension and abduction.  Needs mod A to support forearm and lift to reach top of game board.   Estim:  Applied Saebo stim go to L ventral forearm to stimulate increased AROM of finger and wrist extensors.  Pt continued to practice grasping and releasing checkers with support to her her forearm.  Used estim for FES for 12 min, then applied to L deltoid for cyclic estim unattended for 60 min. Pt  tolerated estim well.    Saebo Stim Go 330 pulse width 35 Hz pulse rate On 8 sec/ off 8 sec Ramp up/ down 2 sec Symmetrical Biphasic wave form  Max intensity 132m at 500 Ohm load  Pt taken back to room with all needs met. Belt alarm on.        Therapy Documentation Precautions:  Precautions Precautions: Fall Precaution Comments: L hemiparesis Restrictions Weight Bearing Restrictions: No    Vital Signs: Therapy Vitals Temp: 98.1 F (36.7 C) Pulse Rate: 69 Resp: 18 BP: 123/67 Patient Position (if appropriate): Lying Oxygen Therapy SpO2: 90 % O2 Device: Room Air Pain: Pain Assessment Pain Scale: 0-10 Pain Score: 2  Pain Type: Chronic pain Pain Location: Generalized Pain Descriptors / Indicators: Aching Pain Frequency: Intermittent Pain Onset: Awakened from sleep Pain Intervention(s): Medication (See eMAR);Repositioned ADL: ADL Grooming: Setup Where Assessed-Grooming: Sitting at sink Upper Body Bathing: Supervision/safety Where Assessed-Upper Body Bathing: Shower Lower Body Bathing: Contact guard Where Assessed-Lower Body Bathing: Shower Upper Body Dressing: Moderate assistance Where Assessed-Upper Body Dressing: Edge of bed Lower Body Dressing: Moderate assistance Where Assessed-Lower Body Dressing: Edge of bed Toileting: Moderate assistance Where Assessed-Toileting: TGlass blower/designer MPsychiatric nurseMethod: SArts development officer GEnergy manager MEnvironmental education officerMethod: SRadiographer, therapeutic Grab bars, Transfer tub bench   Therapy/Group: Individual  Therapy  SAGUIER,JULIA 08/19/2019, 8:44 AM

## 2019-08-19 NOTE — Progress Notes (Signed)
[facility] PHYSICAL MEDICINE & REHABILITATION PROGRESS NOTE  Subjective/Complaints: Patient seen sitting this morning.  She states she did not sleep well overnight because she woke up at 1:00 but could not go back to sleep.  She denies any issues.  She notes few cramps this morning.  ROS: Denies CP, SOB, N/V/D  Objective: Vital Signs: Blood pressure 123/67, pulse 69, temperature 98.1 F (36.7 C), resp. rate 18, height 5' (1.524 m), weight 63.9 kg, SpO2 90 %. No results found. No results for input(s): WBC, HGB, HCT, PLT in the last 72 hours. No results for input(s): NA, K, CL, CO2, GLUCOSE, BUN, CREATININE, CALCIUM in the last 72 hours.  Physical Exam: BP 123/67 (BP Location: Right Arm)   Pulse 69   Temp 98.1 F (36.7 C)   Resp 18   Ht 5' (1.524 m)   Wt 63.9 kg   SpO2 90%   BMI 27.51 kg/m  Constitutional: No distress . Vital signs reviewed. HENT: Normocephalic.  Atraumatic. Eyes: EOMI. No discharge. Cardiovascular: No JVD. Respiratory: Normal effort.  No stridor. GI: Non-distended. Skin: Warm and dry.  Intact. Psych: Normal mood.  Normal behavior. Musc: Bilateral hallux valgus deformities. Neurological: Alert Follows simple commands.   HOH Motor:  Left upper extremity: Shoulder abduction, elbow flexion/extension 4 -/5, handgrip 3+-4 -/5 with improving apraxia Left lower extremity: 4-/5 hip flexion, knee extension, 3/5 ankle dorsiflexion--stable exam.     Assessment/Plan: 1. Functional deficits secondary to right pontine infarct which require 3+ hours per day of interdisciplinary therapy in a comprehensive inpatient rehab setting.  Physiatrist is providing close team supervision and 24 hour management of active medical problems listed below.  Physiatrist and rehab team continue to assess barriers to discharge/monitor patient progress toward functional and medical goals  Care Tool:  Bathing    Body parts bathed by patient: Left arm, Chest, Abdomen, Right upper leg,  Left upper leg, Face, Front perineal area, Right lower leg, Left lower leg, Right arm, Buttocks   Body parts bathed by helper: Buttocks     Bathing assist Assist Level: Contact Guard/Touching assist     Upper Body Dressing/Undressing Upper body dressing   What is the patient wearing?: Pull over shirt    Upper body assist Assist Level: Minimal Assistance - Patient > 75%    Lower Body Dressing/Undressing Lower body dressing      What is the patient wearing?: Pants, Underwear/pull up     Lower body assist Assist for lower body dressing: Moderate Assistance - Patient 50 - 74%     Toileting Toileting    Toileting assist Assist for toileting: Moderate Assistance - Patient 50 - 74%     Transfers Chair/bed transfer  Transfers assist     Chair/bed transfer assist level: Minimal Assistance - Patient > 75% Chair/bed transfer assistive device: Programmer, multimedia   Ambulation assist      Assist level: Minimal Assistance - Patient > 75% Assistive device: Walker-rolling Max distance: 45   Walk 10 feet activity   Assist     Assist level: Minimal Assistance - Patient > 75% Assistive device: Orthosis, Walker-rolling   Walk 50 feet activity   Assist Walk 50 feet with 2 turns activity did not occur: Safety/medical concerns  Assist level: Minimal Assistance - Patient > 75% Assistive device: Walker-rolling    Walk 150 feet activity   Assist Walk 150 feet activity did not occur: Safety/medical concerns  Assist level: 2 helpers Assistive device: Economist  Walk 10 feet on uneven surface  activity   Assist Walk 10 feet on uneven surfaces activity did not occur: Safety/medical concerns         Wheelchair     Assist Will patient use wheelchair at discharge?: No(tbd)             Wheelchair 50 feet with 2 turns activity    Assist            Wheelchair 150 feet activity     Assist            Medical Problem  List and Plan: 1.  Left-sided weakness secondary to small right pontine infarction secondary small vessel disease on 07/30/2019.  Continue CIR. 2.  Antithrombotics: -DVT/anticoagulation: Lovenox  Cr. WNL on 11/20             -antiplatelet therapy: Aspirin 81 mg daily and Plavix 75 mg daily x3 weeks then aspirin alone 3. Pain Management: Tylenol as needed for right shoulder pain  Baclofen nightly started on 11/15 for spasticity, increased to 20 on 11/17, increased to 30 on 11/20 with improvement 4. Mood: Xanax 0.25 mg bid scheduled, in addition to q12 prn doses  Lexapro 5 daily started on 11/23, appears improved  Team to provide emotional support             -antipsychotic agents: N/A 5. Neuropsych: This patient is capable of making decisions on her own behalf. 6. Skin/Wound Care: Routine skin checks 7. Fluids/Electrolytes/Nutrition: Routine in and outs.    BMP within acceptable range on 11/26 8.  History of CLL.  Followed outpatient by Dr. Marko Plume.    Afebrile  WBCs 25.8 on 11/26, labs ordered for tomorrow  Recheck Wednesday 9.  Hypertension.  Patient on Lotrel 5-20 mg daily as needed prior to admission.  Controlled on 12/1             Monitor with increased mobility 10.  Thrombocytopenia             Platelets 125 on 11/26, labs ordered for tomorrow  Recheck Wednesday 11.  Sleep disturbance:   See #3,4  Chamomile tea ordered as well  Improving overall 12. Hypoalbuminemia   Supplement initiated on 11/17 13. Slow transit constipation  Improving 14.  Acute blood loss anemia  Hemoglobin 11.8 on 11/26, labs ordered for tomorrow 15.  Macular degeneration  Home PreserVision tabs ordered  LOS: 18 days A FACE TO FACE EVALUATION WAS PERFORMED  Vaughn Frieze Lorie Phenix 08/19/2019, 8:29 AM

## 2019-08-20 ENCOUNTER — Encounter (HOSPITAL_COMMUNITY): Payer: Self-pay

## 2019-08-20 ENCOUNTER — Inpatient Hospital Stay (HOSPITAL_COMMUNITY): Payer: PPO | Admitting: Occupational Therapy

## 2019-08-20 ENCOUNTER — Inpatient Hospital Stay (HOSPITAL_COMMUNITY): Payer: PPO

## 2019-08-20 ENCOUNTER — Encounter (HOSPITAL_COMMUNITY): Payer: PPO | Admitting: Psychology

## 2019-08-20 LAB — CBC
HCT: 38.1 % (ref 36.0–46.0)
Hemoglobin: 12.2 g/dL (ref 12.0–15.0)
MCH: 30.8 pg (ref 26.0–34.0)
MCHC: 32.2 g/dL (ref 30.0–36.0)
MCV: 96.7 fL (ref 80.0–100.0)
Platelets: 106 10*3/uL — ABNORMAL LOW (ref 150–400)
RBC: 3.94 MIL/uL (ref 3.87–5.11)
RDW: 15.1 % (ref 11.5–15.5)
WBC: 22.3 10*3/uL — ABNORMAL HIGH (ref 4.0–10.5)
nRBC: 0 % (ref 0.0–0.2)

## 2019-08-20 NOTE — Progress Notes (Signed)
Social Work Patient ID: Abigail Moreno, female   DOB: 1929/01/22, 85 y.o.   MRN: [id number]      Diagnosis codes: I63.9, H35.30 & C91.10  Height:   5'0             Weight: 132 lbs           Patient suffers from R-CVA and Leukemia  which impairs her ability to perform daily activities like ADL's and tolieting   in the home.  A walker  will not resolve issue with performing activities of daily living.  A wheelchair will allow patient to safely perform daily activities.  Patient is not able to propel themselves in the home using a standard weight wheelchair due to fatigue and endurance .  Patient can self propel in the lightweight wheelchair.

## 2019-08-20 NOTE — Progress Notes (Signed)
[facility] PHYSICAL MEDICINE & REHABILITATION PROGRESS NOTE  Subjective/Complaints: Patient seen laying in bed this AM.  She states she slept better overnight.  She denies complaints.   ROS: Denies CP, SOB, N/V/D  Objective: Vital Signs: Blood pressure 125/74, pulse 66, temperature 97.8 F (36.6 C), resp. rate 19, height 5' (1.524 m), weight 63.9 kg, SpO2 93 %. No results found. Recent Labs    08/20/19 0513  WBC 22.3*  HGB 12.2  HCT 38.1  PLT 106*   No results for input(s): NA, K, CL, CO2, GLUCOSE, BUN, CREATININE, CALCIUM in the last 72 hours.  Physical Exam: BP 125/74 (BP Location: Right Arm)   Pulse 66   Temp 97.8 F (36.6 C)   Resp 19   Ht 5' (1.524 m)   Wt 63.9 kg   SpO2 93%   BMI 27.51 kg/m  Constitutional: No distress . Vital signs reviewed. HENT: Normocephalic.  Atraumatic. Eyes: EOMI. No discharge. Cardiovascular: No JVD. Respiratory: Normal effort.  No stridor. GI: Non-distended. Skin: Warm and dry.  Intact. Psych: Normal mood.  Normal behavior. Musc: Bilateral hallux valgus deformities Neurological: Alert Follows simple commands.   HOH Motor:  Left upper extremity: Shoulder abduction, elbow flexion/extension 4-/5, handgrip 3+-4 -/5 with apraxia Left lower extremity: 4-/5 hip flexion, knee extension, 3+/5 ankle dorsiflexion  Assessment/Plan: 1. Functional deficits secondary to right pontine infarct which require 3+ hours per day of interdisciplinary therapy in a comprehensive inpatient rehab setting.  Physiatrist is providing close team supervision and 24 hour management of active medical problems listed below.  Physiatrist and rehab team continue to assess barriers to discharge/monitor patient progress toward functional and medical goals  Care Tool:  Bathing    Body parts bathed by patient: Left arm, Chest, Abdomen, Right upper leg, Left upper leg, Face, Front perineal area, Right lower leg, Left lower leg, Right arm, Buttocks   Body parts  bathed by helper: Buttocks     Bathing assist Assist Level: Contact Guard/Touching assist     Upper Body Dressing/Undressing Upper body dressing   What is the patient wearing?: Pull over shirt    Upper body assist Assist Level: Minimal Assistance - Patient > 75%    Lower Body Dressing/Undressing Lower body dressing      What is the patient wearing?: Pants, Underwear/pull up     Lower body assist Assist for lower body dressing: Moderate Assistance - Patient 50 - 74%     Toileting Toileting    Toileting assist Assist for toileting: Moderate Assistance - Patient 50 - 74%     Transfers Chair/bed transfer  Transfers assist     Chair/bed transfer assist level: Minimal Assistance - Patient > 75% Chair/bed transfer assistive device: Programmer, multimedia   Ambulation assist      Assist level: Minimal Assistance - Patient > 75% Assistive device: Walker-rolling Max distance: 37ft   Walk 10 feet activity   Assist     Assist level: Minimal Assistance - Patient > 75% Assistive device: Walker-rolling   Walk 50 feet activity   Assist Walk 50 feet with 2 turns activity did not occur: Safety/medical concerns  Assist level: Minimal Assistance - Patient > 75% Assistive device: Walker-rolling    Walk 150 feet activity   Assist Walk 150 feet activity did not occur: Safety/medical concerns  Assist level: 2 helpers Assistive device: Lite Gait    Walk 10 feet on uneven surface  activity   Assist Walk 10 feet on uneven surfaces activity did  not occur: Safety/medical concerns         Wheelchair     Assist Will patient use wheelchair at discharge?: No(tbd)             Wheelchair 50 feet with 2 turns activity    Assist            Wheelchair 150 feet activity     Assist            Medical Problem List and Plan: 1.  Left-sided weakness secondary to small right pontine infarction secondary small vessel disease on  07/30/2019.  Continue CIR  Team conference today to discuss current and goals and coordination of care, home and environmental barriers, and discharge planning with nursing, case manager, and therapies.  2.  Antithrombotics: -DVT/anticoagulation: Lovenox  Cr. WNL on 11/20             -antiplatelet therapy: Aspirin 81 mg daily and Plavix 75 mg daily x3 weeks then aspirin alone 3. Pain Management: Tylenol as needed for right shoulder pain  Baclofen nightly started on 11/15 for spasticity, increased to 20 on 11/17, increased to 30 on 11/20 with improvement 4. Mood: Xanax 0.25 mg bid scheduled, in addition to q12 prn doses  Lexapro 5 daily started on 11/23, appears improved  Team to provide emotional support             -antipsychotic agents: N/A 5. Neuropsych: This patient is capable of making decisions on her own behalf. 6. Skin/Wound Care: Routine skin checks 7. Fluids/Electrolytes/Nutrition: Routine in and outs.    BMP within acceptable range on 11/26, plan to order labs for the end of the week 8.  History of CLL.  Followed outpatient by Dr. Marko Plume.    Afebrile  WBCs 22.3 on 12/2  Recheck Wednesday 9.  Hypertension.  Patient on Lotrel 5-20 mg daily as needed prior to admission.  Controlled on 12/2             Monitor with increased mobility 10.  Thrombocytopenia             Platelets 106 on 12/2  Recheck Wednesday 11.  Sleep disturbance:   See #3,4  Chamomile tea ordered as well  Improving overall 12. Hypoalbuminemia   Supplement initiated on 11/17 13. Slow transit constipation  Improving 14.  Acute blood loss anemia  Hemoglobin 12.2 on 12/2 15.  Macular degeneration  Home PreserVision tabs ordered  LOS: 19 days A FACE TO FACE EVALUATION WAS PERFORMED   Lorie Phenix 08/20/2019, 9:09 AM

## 2019-08-20 NOTE — Consult Note (Signed)
Neuropsychological Consultation   Patient:   Abigail Moreno   DOB:   04/14/1929  MR Number:  [id number]  Location:  [facility] [id number] [place] [address] Dept: [place]: [phone]           Date of Service:   08/20/2019  Start Time:   10 AM End Time:   11 AM  Provider/Observer:  Ilean Skill, Psy.D.       Clinical Neuropsychologist       Billing Code/Service: 96158/96159  Chief Complaint:    Meridian Sou is an 85 year old right-handed female with a history of CLL, hypertension as well as hyperlipidemia.  The patient presented on 07/30/2019 with left-sided weakness and fall after syncopal episode landing on her face on a fireplace hearth.  Cranial CT was unremarkable for acute intracranial abnormalities.  There was soft tissue contusion on the forehead without underlying fracture.  MRI of brain showed small acute infarct of superior right pons.  Patient admitted for CIR.  Patient has had worry about being a burden upon daughter, who will be taking care of patient upon discharge.    Reason for Service:  Patient was referred for neuropsychological consultation due to coping and adjustment/worry issues.  Below is the HPI for current admission.  HPI: Abigail Moreno is an 85 year old right-handed female with history of CLL under observation followed by Dr. Marko Plume, hypertension as well as hyperlipidemia.  Per chart review and patient, patient lives alone and was independent prior to admission.  Her daughter lives local and can provide assistance as needed.  She presented 07/30/2019 with left-sided weakness and fall after syncopal episode landing on her face.  Cranial CT unremarkable for acute intracranial abnormalities.  There was a soft tissue contusion on the forehead without underlying fracture.  CT maxillofacial negative for fracture.  Patient did not receive TPA.   CT angiogram of head and neck with no emergent large vessel occlusion or stenosis.  MRI of the brain showed small acute infarct of the superior right pons.  Echocardiogram with ejection fraction of 70% without emboli.  Neurology recommended aspirin and Plavix x3 weeks, then aspirin alone.  Subcutaneous Lovenox for DVT prophylaxis.  Tolerating a regular diet.  Therapy evaluations completed and patient was admitted for a comprehensive rehab program.  Please see preadmission assessment from earlier today as well.  Current Status:  Patient reports that she is worried less and looking forward to discharge and going to daughter's home.  She sees improvements and has less anxiety etc.    Behavioral Observation: ANDRIETTE BUCHMANN  presents as a 85 y.o.-year-old Right Caucasian Female who appeared her stated age. her dress was Appropriate and she was Well Groomed and her manners were Appropriate to the situation.  her participation was indicative of Appropriate and Redirectable behaviors.  There were any physical disabilities noted.  she displayed an appropriate level of cooperation and motivation.     Interactions:    Active Appropriate and Redirectable  Attention:   abnormal and attention span appeared shorter than expected for age  Memory:   within normal limits; recent and remote memory intact  Visuo-spatial:  not examined  Speech (Volume):  low  Speech:   normal; normal  Thought Process:  Coherent and Relevant  Though Content:  WNL; not suicidal and not homicidal  Orientation:   person, place, time/date and situation  Judgment:   Fair  Planning:  Poor  Affect:    Anxious  Mood:    Anxious  Insight:   Good  Intelligence:   normal  Medical History:   Past Medical History:  Diagnosis Date  . Anxiety   . Arthritis of knee  FALL 2012   RIGHT KNEE, hips, and fingers  . Breast cancer ([place]) FEBRUARY 2004   T1, NO, ER/PR POSITIVE LEFT BREAST  . CLL (chronic lymphocytic leukemia)  ([place]) 2004   Dr. Marko Plume is oncologist  . Dyspnea    occ sob with exertion  . History of blood transfusion    with first and maybe second delivery  . Hyperlipemia   . Hypertension   . Macular degeneration    both eyes  . Osteoporosis    borderline  . UTI (urinary tract infection)    on nitrofuratonin bid x 7 days started 09-12-18 am    Psychiatric History:  Patient has history of anxiety prior and symptoms continue.  Family Med/Psych History:  Family History  Problem Relation Age of Onset  . Pancreatic cancer Maternal Grandmother   . Cancer Paternal Grandmother        ? type   . Cancer Other        breast-1st paternal cousin-no treatment, liver cancer-2nd maternal cousin  . Hypertension Mother   . Hypertension Sister        x2  . High Cholesterol Sister        x2  . Heart attack Father     Impression/DX:  Abigail Moreno is an 85 year old right-handed female with a history of CLL, hypertension as well as hyperlipidemia.  The patient presented on 07/30/2019 with left-sided weakness and fall after syncopal episode landing on her face on a fireplace hearth.  Cranial CT was unremarkable for acute intracranial abnormalities.  There was soft tissue contusion on the forehead without underlying fracture.  MRI of brain showed small acute infarct of superior right pons.  Patient admitted for CIR.  Patient has had worry about being a burden upon daughter, who will be taking care of patient upon discharge.   Patient reports that she is worried less and looking forward to discharge and going to daughter's home.  She sees improvements and has less anxiety etc.   Disposition/Plan:  Worked on issues of anxiety and worry.    Diagnosis:    Right pontine CVA ([place]) - Plan: Ambulatory referral to Neurology, Ambulatory referral to Physical Medicine Rehab         Electronically Signed   _______________________ Ilean Skill, Psy.D.

## 2019-08-20 NOTE — Progress Notes (Addendum)
Physical Therapy Session Note  Patient Details  Name: Abigail Moreno MRN: [id number] Date of Birth: 05/28/1929  Today's Date: 08/20/2019 PT Individual Time: 1100-1205; 1400-1505 PT Individual Time Calculation (min): 65 min , 65 min  Short Term Goals:  Week 3:  PT Short Term Goal 1 (Week 3): Patient will perform functional transfers using LRAD with CGA PT Short Term Goal 2 (Week 3): Pt will ambulate at least 15ft using LRAD with CGA PT Short Term Goal 3 (Week 3): Pt will ascend/descend 4 steps using L handrail with min assist  Skilled Therapeutic Interventions/Progress Updates:   tx 1:  Pt in w/c, asleep. She easily awakened to name and touch.  She denied pain.  Pt remembered sequencing of transfer without cues.  Sit> stand with mod assist;  Stand pivot with RW, min assist and mod cues for technique.   Stand pivot w/c> NuStep .  Use of NuStep for global endurance, at level 3 x 7 min, L hand splint. Pt stated the movement felt good.   neuromuscular re-education via multimodal cues , hand out, and forced use for sustained stretch L hamstrings, seated in w/c iwht footstool.  Pt able to actively DF at ankle to address heel cord tightness, as well.   PT assessed pt's L LL for bruising; bruising lessened since this PT last saw pt on 11/30.  PT donned L AFO.  Gait training on level tile with RW, LAFO x 100' including turns; CGA and cues for keeping feet apart especially during turns, and for clearing L foot.  L foot clearance limited distance.  At end of session, pt in w/c iwht needs at hand; L AFO doffed.  PT discussed falls risk with pt; she is well aware that she cannot get up by herself.  Seat belt alarm not placed.   tx 2:  Pt seated in w/c.  She denied pain.  Sit> stand from w/c to RW, mod assist.  Min assist and mod cues for stand pivot with RW to mat.  Sit> supine with mod assist.    In R side lying, neuromuscular re-education via demo, mutlimodal cues and FES for : active R  hip flexion with FES on L anterior tib for DF.  Pt demonstrated excellent coordination of hip and knee with ankle movements.   Saebo Stim Go 8 minutes 330 pulse width 35 Hz pulse rate On 8 sec/ off 8 sec Ramp up/ down 2 sec Symmetrical Biphasic wave form  Max intensity 154mA at 500 Ohm load   With e. stim removed: L hip abduction (clam shells) and pelvic anterior elevation/posterior depression.   Skin check later in day at sites of electrode pads L LL: no change in bruising; no signs of increased skin irritation.  Pt wheezed during session, in supine.  PT informed [name], Therapist, sports.   Bed mobility training for supine> hook lying> rolling R/L, on flat mat with cues.  She benefits from cues to turn her head.  R side lying> sitting with min assist ; L side lying> sitting with mod assist, max cues.   Gait training up/down (4) 6" high steps, L rail, without AD. Mod assist to ascend and descend turned sideways, with bil hands on L rail; step- to method. Pt's R hip control improved vs tx with this PT on 11/30, for preventing hip adduction as she attempts to boost up onto RLE.   Pt requested getting back to bed. Sit> supine to L with tactile cues, in flat bed, not rails.  She agreed to get OOB for dinner; PT informed NT.  Alarm set and needs at hand.      Therapy Documentation Precautions:  Precautions Precautions: Fall Precaution Comments: L hemiparesis Restrictions Weight Bearing Restrictions: No         Therapy/Group: Individual Therapy  Delonna Ney 08/20/2019, 12:14 PM

## 2019-08-20 NOTE — Progress Notes (Signed)
Abigail Moreno with therapy informed this nurse that pt was wheezing during therapy session this afternoon.  Informed her that pt usually is wheezing in am but after strong coughs and moving around usually clears. She informed me that this was this afternoon. Re-evaluated pt, no wheezing noted upon ascultation. Pt left sitting in recliner with all needs met. Call bell in reach.

## 2019-08-20 NOTE — Progress Notes (Signed)
Occupational Therapy Session Note  Patient Details  Name: Abigail Moreno MRN: [id number] Date of Birth: 12/31/1928  Today's Date: 08/20/2019 OT Individual Time: 1829-9371 OT Individual Time Calculation (min): 75 min    Short Term Goals: Week 3:  OT Short Term Goal 1 (Week 3): Pt will complete toilet transfer with CGA stand pivot. OT Short Term Goal 2 (Week 3): Pt will complete toileting with min A. OT Short Term Goal 3 (Week 3): Pt will complete LB dressing min A OT Short Term Goal 4 (Week 3): Pt will complete UB dressing min A. OT Short Term Goal 5 (Week 3): Pt will stand with CGA as she pulls pants over hips with min A.  Skilled Therapeutic Interventions/Progress Updates:  Pt received in bed ready for therapy. She was wheezing several times but her breathing improved once she was sitting to EOB and active in therapy. Min A to sit to EOB, stand to RW and ambulate to bathroom.  Pt min A overall ambulating /transferring to toilet and shower.  Improved L arm strength and function to use it with bathing and dressing.  See ADL documentation below.   Pt has improved L hand grasp and release with 80% full finger extension and abduction which has improved a great deal from yesterday.  Pt responded well to estim on forearm yesterday so reapplied today to work on finger and thumb ext/abd.  Pt worked on active extension with functional estim.  Used for 12 min and then placed on L shoulder deltoid for cyclic estim to increase shoulder AROM.  Pt worked on self arom sitting in w/c sliding arm forward and back on lap tray.   Pt responded well to estim.    Saebo Stim Go 330 pulse width 35 Hz pulse rate On 8 sec/ off 8 sec Ramp up/ down 2 sec Symmetrical Biphasic wave form  Max intensity 142m at 500 Ohm load  Pt resting in w/c with belt alarm on and all needs met.      Therapy Documentation Precautions:  Precautions Precautions: Fall Precaution Comments: L  hemiparesis Restrictions Weight Bearing Restrictions: No   Pain: c/o L heel pain in bed, she states it felt better once up. She did have a pillow under calf.    ADL: ADL Grooming: Setup Where Assessed-Grooming: Sitting at sink Upper Body Bathing: Supervision/safety Where Assessed-Upper Body Bathing: Shower Lower Body Bathing: Contact guard Where Assessed-Lower Body Bathing: Shower Upper Body Dressing: Minimal assistance Where Assessed-Upper Body Dressing: Edge of bed Lower Body Dressing: Moderate assistance Where Assessed-Lower Body Dressing: Wheelchair Toileting: Minimal assistance Where Assessed-Toileting: TGlass blower/designer MPsychiatric nurseMethod: ACounselling psychologist GEnergy manager MEnvironmental education officerMethod: AHeritage manager Grab bars, Transfer tub bench   Therapy/Group: Individual Therapy  , 08/20/2019, 10:14 AM

## 2019-08-20 NOTE — Patient Care Conference (Signed)
Inpatient RehabilitationTeam Conference and Plan of Care Update Date: 08/20/2019   Time: 11:00 AM    Patient Name: Abigail Moreno      Medical Record Number: [id number]  Date of Birth: [birth year] Sex: Female         Room/Bed: 4W15C/4W15C-01 Payor Info: Payor: [employer] AD[employer]AGE / Plan: [employer] Must PPO / Product Type: *No Product type* /    Admit Date/Time:  08/01/2019  6:47 PM  Primary Diagnosis:  Right pontine CVA [facility])  Patient Active Problem List   Diagnosis Date Noted  . Acute blood loss anemia   . Reactive depression   . Slow transit constipation   . Benign essential HTN   . Sleep disturbance   . Labile blood pressure   . Muscle spasm of left lower extremity   . Personal history of CLL (chronic lymphocytic leukemia)   . Pressure injury of skin 08/01/2019  . Right pontine CVA ([place]) 08/01/2019  . Acute CVA (cerebrovascular accident) ([place]) 07/30/2019  . Essential hypertension 07/30/2019  . Overweight (BMI 25.0-29.9) 09/18/2018  . S/P left THA, AA 09/17/2018  . Environmental allergies 07/19/2016  . History of left breast cancer 10/23/2015  . Status post hysterectomy 06/15/2015  . Unresolved grief 06/15/2015  . Thrombocytopenia ([place]) 06/15/2015  . Need for prophylactic vaccination and inoculation against influenza 06/15/2015  . Mixed hyperlipidemia 03/16/2015  . Osteopenia 03/16/2015  . Osteoarthritis 03/16/2015  . Vaginal atrophy 03/16/2015  . Papanicolaou smear for cervical cancer screening 03/16/2015  . Macular degeneration 01/07/2015  . CLL (chronic lymphocytic leukemia) ([place]) 11/18/2011  . Breast cancer ([place]) 11/18/2011    Expected Discharge Date: Expected Discharge Date: 08/27/19  Team Members Present: Physician leading conference: Dr. Delice Lesch Social Worker Present: Ovidio Kin, LCSW Nurse Present: Isla Pence, RN Case Manager: Karene Fry, RN PT Present: Michaelene Song, PT OT Present: Meriel Pica, OT SLP Present: Charolett Bumpers,  SLP PPS Coordinator present : Gunnar Fusi, SLP     Current Status/Progress Goal Weekly Team Focus  Bowel/Bladder   Continent bowel and bladder  Remain continent bowel and bladder  assist with toileting qs and PRN   Swallow/Nutrition/ Hydration             ADL's   she continues to need mod A with dressing, but can now bathe with CGA. significant improvement with static stand balance (CGA) and dynamic (min) and RW ambulation and transfers with CGA to min A.  initiated family ed with daughter 08/16/19  Supervision overall  NMR, estim, balance training, ADL retraining, pt/family education   Mobility   min assist bed mobility, sit<>stand, and stand pivot transfers using RW; gait up to 1ft using RW with min assist (improved foot clearance with AF), mod assist for 4stair navigation using L handrail  supervision overall  gait training, L LE NMR, stair navigation, activity tolerance, family education, transfers, balance   Communication             Safety/Cognition/ Behavioral Observations            Pain   Rt shoulder pain (chronic) , Reposition with pillow under Rt shoulder, Tylenol PRN  less than 3 out of 10  Assess pain q shift and PRN   Skin   Facial, left leg, right rib bruising.  no new skin breakdown  assess skin q shift and PRN      *See Care Plan and progress notes for long and short-term goals.     Barriers to Discharge  Current Status/Progress  Possible Resolutions Date Resolved   Nursing                  PT                    OT                  SLP                SW                Discharge Planning/Teaching Needs:  Home to daughter's home who is working from home. Pt having difficluty coping with feeling like she is a burden to her daughter-seeing neruo-psych  Family education begun with daughter and son   Team Discussion: Meds started for macular dengeneration.  RN - cont B/B, timed toileting in place, L foot pain, R shoulder pain, prn med given, wheezing this  am.  OT L heel pain, better with moving, balance better, can stand CGA, R shoulder ROM done, fam ed Saturday with Dtr.  PT min overall, amb 60-80' walker, AFO in use, may downgrade goals to CGA, fam ed Thursday.   Revisions to Treatment Plan: N/A     Medical Summary Current Status: Left-sided weakness secondary to small right pontine infarction secondary small vessel disease on 07/30/2019 Weekly Focus/Goal: Improve mobility, follow WBCs/Platelets  Barriers to Discharge: Medical stability;Decreased family/caregiver support   Possible Resolutions to Barriers: Therapies, follow labs   Continued Need for Acute Rehabilitation Level of Care: The patient requires daily medical management by a physician with specialized training in physical medicine and rehabilitation for the following reasons: Direction of a multidisciplinary physical rehabilitation program to maximize functional independence : Yes Medical management of patient stability for increased activity during participation in an intensive rehabilitation regime.: Yes Analysis of laboratory values and/or radiology reports with any subsequent need for medication adjustment and/or medical intervention. : Yes   I attest that I was present, lead the team conference, and concur with the assessment and plan of the team.   Retta Diones 08/20/2019, 9:45 PM  Team conference was held via web/ teleconference due to [place] - 19

## 2019-08-21 ENCOUNTER — Encounter (HOSPITAL_COMMUNITY): Payer: PPO | Admitting: Occupational Therapy

## 2019-08-21 ENCOUNTER — Inpatient Hospital Stay (HOSPITAL_COMMUNITY): Payer: PPO

## 2019-08-21 ENCOUNTER — Inpatient Hospital Stay (HOSPITAL_COMMUNITY): Payer: PPO | Admitting: Physical Therapy

## 2019-08-21 ENCOUNTER — Ambulatory Visit (HOSPITAL_COMMUNITY): Payer: PPO | Admitting: Physical Therapy

## 2019-08-21 DIAGNOSIS — R609 Edema, unspecified: Secondary | ICD-10-CM

## 2019-08-21 DIAGNOSIS — R05 Cough: Secondary | ICD-10-CM

## 2019-08-21 DIAGNOSIS — R058 Other specified cough: Secondary | ICD-10-CM

## 2019-08-21 MED ORDER — ALBUTEROL SULFATE (2.5 MG/3ML) 0.083% IN NEBU: 3.0000 mL | INHALATION_SOLUTION | Freq: Four times a day (QID) | RESPIRATORY_TRACT | Status: DC | PRN

## 2019-08-21 NOTE — Progress Notes (Signed)
[facility] PHYSICAL MEDICINE & REHABILITATION PROGRESS NOTE  Subjective/Complaints: Patient seen sitting up in bed this morning.  She states she did not sleep well overnight due to wheezing.  Patient notes improvement with IS.  Discussed with nursing, who notes purulent sputum.  Discussed flutter valve with nursing.  ROS: + Intermittent wheezing.  Denies CP, SOB, N/V/D  Objective: Vital Signs: Blood pressure 135/66, pulse 71, temperature 98.3 F (36.8 C), resp. rate 18, height 5' (1.524 m), weight 63.9 kg, SpO2 93 %. No results found. Recent Labs    08/20/19 0513  WBC 22.3*  HGB 12.2  HCT 38.1  PLT 106*   No results for input(s): NA, K, CL, CO2, GLUCOSE, BUN, CREATININE, CALCIUM in the last 72 hours.  Physical Exam: BP 135/66 (BP Location: Right Arm)   Pulse 71   Temp 98.3 F (36.8 C)   Resp 18   Ht 5' (1.524 m)   Wt 63.9 kg   SpO2 93%   BMI 27.51 kg/m  Constitutional: No distress . Vital signs reviewed. HENT: Normocephalic.  Atraumatic. Eyes: EOMI. No discharge. Cardiovascular: No JVD. Respiratory: Normal effort.  No stridor.  Clear. GI: Non-distended. Skin: Warm and dry.  Intact. Psych: Normal mood.  Normal behavior. Musc: Bilateral hallux valgus deformities Left upper extremity edema Neurological: Alert Follows simple commands.   HOH Motor:  Left upper extremity: Shoulder abduction, elbow flexion/extension 4-/5, handgrip 3+-4 -/5 with apraxia, improving Left lower extremity: 4-/5 hip flexion, knee extension, 3+/5 ankle dorsiflexion, stable  Assessment/Plan: 1. Functional deficits secondary to right pontine infarct which require 3+ hours per day of interdisciplinary therapy in a comprehensive inpatient rehab setting.  Physiatrist is providing close team supervision and 24 hour management of active medical problems listed below.  Physiatrist and rehab team continue to assess barriers to discharge/monitor patient progress toward functional and medical  goals  Care Tool:  Bathing    Body parts bathed by patient: Left arm, Chest, Abdomen, Right upper leg, Left upper leg, Face, Front perineal area, Right lower leg, Left lower leg, Right arm, Buttocks   Body parts bathed by helper: Buttocks     Bathing assist Assist Level: Contact Guard/Touching assist     Upper Body Dressing/Undressing Upper body dressing   What is the patient wearing?: Pull over shirt    Upper body assist Assist Level: Minimal Assistance - Patient > 75%    Lower Body Dressing/Undressing Lower body dressing      What is the patient wearing?: Pants, Underwear/pull up     Lower body assist Assist for lower body dressing: Moderate Assistance - Patient 50 - 74%     Toileting Toileting    Toileting assist Assist for toileting: Minimal Assistance - Patient > 75%     Transfers Chair/bed transfer  Transfers assist     Chair/bed transfer assist level: Minimal Assistance - Patient > 75% Chair/bed transfer assistive device: Museum/gallery exhibitions officer assist      Assist level: Contact Guard/Touching assist Assistive device: Walker-rolling Max distance: 100   Walk 10 feet activity   Assist     Assist level: Contact Guard/Touching assist Assistive device: Walker-rolling, Orthosis   Walk 50 feet activity   Assist Walk 50 feet with 2 turns activity did not occur: Safety/medical concerns  Assist level: Contact Guard/Touching assist Assistive device: Walker-rolling, Orthosis    Walk 150 feet activity   Assist Walk 150 feet activity did not occur: Safety/medical concerns  Assist level: 2 helpers Assistive  device: Lite Gait    Walk 10 feet on uneven surface  activity   Assist Walk 10 feet on uneven surfaces activity did not occur: Safety/medical concerns         Wheelchair     Assist Will patient use wheelchair at discharge?: No(tbd)             Wheelchair 50 feet with 2 turns  activity    Assist            Wheelchair 150 feet activity     Assist            Medical Problem List and Plan: 1.  Left-sided weakness secondary to small right pontine infarction secondary small vessel disease on 07/30/2019.  Continue CIR 2.  Antithrombotics: -DVT/anticoagulation: Lovenox  Left upper extremity Doppler ordered             -antiplatelet therapy: Aspirin 81 mg daily and Plavix 75 mg daily x3 weeks then aspirin alone 3. Pain Management: Tylenol as needed for right shoulder pain  Baclofen nightly started on 11/15 for spasticity, increased to 20 on 11/17, increased to 30 on 11/20 with improvement 4. Mood: Xanax 0.25 mg bid scheduled, in addition to q12 prn doses  Lexapro 5 daily started on 11/23, appears improved  Team to provide emotional support             -antipsychotic agents: N/A 5. Neuropsych: This patient is capable of making decisions on her own behalf. 6. Skin/Wound Care: Routine skin checks 7. Fluids/Electrolytes/Nutrition: Routine in and outs.    BMP within acceptable range on 11/26, labs ordered for tomorrow 8.  History of CLL.  Followed outpatient by Dr. Marko Plume.    Afebrile  WBCs 22.3 on 12/2, labs ordered for tomorrow  Recheck Wednesday 9.  Hypertension.  Patient on Lotrel 5-20 mg daily as needed prior to admission.  Controlled on 12/3             Monitor with increased mobility 10.  Thrombocytopenia             Platelets 106 on 12/2  Recheck Wednesday 11.  Sleep disturbance:   See #3,4  Chamomile tea ordered as well  Improving overall 12. Hypoalbuminemia   Supplement initiated on 11/17 13. Slow transit constipation  Improving 14.  Acute blood loss anemia  Hemoglobin 12.2 on 12/2 15.  Macular degeneration  Home PreserVision tabs ordered 16.  Intermittent wheezing  Per discussion with therapies, intermittent and relieved with activity  With noted purulent sputum by nursing  Sputum culture ordered  Chest x-ray  ordered  Flutter valve ordered  Albuterol as needed ordered   LOS: 20 days A FACE TO FACE EVALUATION WAS PERFORMED  Ankit Lorie Phenix 08/21/2019, 8:48 AM

## 2019-08-21 NOTE — Progress Notes (Signed)
Patient c/o armbands getting tighter in left wrist. Noticed +2 pitting edema from hand to arm that subsides rapidly. Loosely applied new armbands and elevated affected arm with pillows. Patient went back to bed to rest after toileting, will continue to monitor.

## 2019-08-21 NOTE — Progress Notes (Signed)
Social Work Patient ID: Abigail Moreno, female   DOB: 05-13-1929, 85 y.o.   MRN: [id number]  Son and daughter were here for family education and it went well. Pt is doing better than she was and she can see her progress which is good. Both daughter and son aware pt will need 24 hr CGA at home and plan on providing this. Will work on discharge needs and prepare for discharge next Wed.

## 2019-08-21 NOTE — Progress Notes (Signed)
Left upper extremity venous duplex completed. Refer to "CV Proc" under chart review to view preliminary results.  08/21/2019 10:44 AM Kelby Aline., MHA, RVT, RDCS, RDMS

## 2019-08-21 NOTE — Progress Notes (Signed)
Physical Therapy Session Note  Patient Details  Name: Abigail Moreno MRN: [id number] Date of Birth: [birth year]  Today's Date: 08/21/2019 PT Individual Time: [phone] and [id number] PT Individual Time Calculation (min): 60 min and 72 min  Short Term Goals: Week 3:  PT Short Term Goal 1 (Week 3): Patient will perform functional transfers using LRAD with CGA PT Short Term Goal 2 (Week 3): Pt will ambulate at least 87ft using LRAD with CGA PT Short Term Goal 3 (Week 3): Pt will ascend/descend 4 steps using L handrail with min assist  Skilled Therapeutic Interventions/Progress Updates:    Session 1: Pt received sitting in w/c reporting she didn't sleep well last night due to wheezing but denies any this AM. Pt agreeable to therapy session. Therapist assessed L LE bruising and noted it has improved since Tuesday - therapist recommended pt continue to sit in recliner during the day as opposed to the w/c to allow B LE elevation - RN educated on this recommendation. Therapist donned B LE TED hose and shoes as well as L LE AFO max assist for time management.  Transported to/from gym in w/c. Sit>stand w/c>RW with CGA for steadying and pt demonstrating improved ability to recall sequencing of transfer with only min cuing for L UE management. Gait training ~8ft from hallway into ADL apartment using RW with CGA for steadying and min cuing for safe AD management over threshold and for increased L LE foot clearance and step length. Recliner furniture sit<>stand transfer x2 using RW with pt requiring CGA going to sitting and heavy min assist for lifting into standing due to low surface - cuing for increased anterior trunk weight shift. Gait training in ADL apartment kitchen using RW with CGA and education on use of RW bag to transport items - cuing and education throughout for safe AD management, ensuring proper L LE positioning prior to reaching for an item, and use of reacher as needed. Performed dynamic  standing balance, standing tolerance, and bimanual task of cleaning fruit with mod/max assist for grasping and holding fruit in L hand to be cleaned by her R hand - CGA for steadying while standing without UE support on RW. After this task pt noted to be wheezing - provided seated rest break. Gait training ~90ft using RW with CGA for steadying and intermittent cuing to ensure L LE foot clearance during swing phase. Transported back to room in w/c. Pt needing to return to bed for L UE doppler. Stand pivot w/c>EOB using RW with CGA for steadying coming to standing and turning with min cuing for L UE placement on/off RW. Doffed shirt and donned gown sitting EOB with max assist for time management. Sit>supine with min assist for B LE management. Pt left supine in bed in the care of cariology RN for L UE doppler.   Session 2: Pt received sitting in w/c with her daughter and son present for family education. Therapist re-iterated education regarding pt's CLOF, need to use RW with L UE hand orthosis, wearing shoe with toe cap and L LE AFO, as well as proper use of gait belt and providing assistance on pt's L side.  Transported to/from gym in w/c. Sit<>stands w/c<>RW with CGA for steadying throughout session and intermittent cuing for proper L UE placement on/off AD/armrest and for increased anterior trunk weight shift. Ambulated ~49ft using RW with therapist providing CGA for steadying while educating pt's family on proper positioning to provide assistance. Ambulated ~42ft with pt's daughter demonstrating understanding of  proper CGA for patient during gait. Educated family regarding proper set-up prior to performing stair navigation as well as pt's side step technique and proper positioning to provide her assistance. Pt ascended/descended 4 steps using L HR with therapist providing min/mod assist for ascent as pt demonstrating improved ability to lift herself up onto the step and min assist for descent to increase L hip  abduction for proper foot placement on step - therapist providing education to family on how to provide this assistance properly and safely. Pt ascended/descended 4 steps using L HR with her son providing the physical assistance and therapist providing mod cuing for improved assistance position. Pt's daughter exiting therapy session but pt's son present for remainder of session. Transported to car room. Pt performed simulated stand pivot car transfer (small SUV height) using RW with therapist educating pt's son on proper set-up of transfer and management of w/c - therapist providing CGA/min assist for steadying and moving L LE in/out of vehicle to demonstrate to son and he finished assisting pt with transfer without hands-on assistance from therapist. Pt noted to have significant wheezing during car transfer therefore provided extended seated rest break. Transported back to room in w/c. Educated pt on proper use of flutter divide and pt performed x10. Stand pivot w/c>EOB using RW with therapist educating on proper set-up of transfer and pt's son providing appropriate CGA steadying for patient as well as demonstrating ability to provide pt proper cuing to improve L LE foot placement when stepping to turn. Sit<>supine in bed, HOB partially elevated, and pt's son providing proper min assist for trunk control and B LE management with therapist educating him on safe L UE shoulder management. Therapist educated pt's son on CVA educational notebook with images for proper bed positioning. Pt performed EOB<>recliner transfer using RW with pt's son providing proper CGA for steadying and cuing for improved pt AD management and L LE stepping. RN requesting pt be left supine in bed for imaging - pt left supine in bed with needs in reach, bed alarm on, and her son present.  Therapy Documentation Precautions:  Precautions Precautions: Fall Precaution Comments: L hemiparesis Restrictions Weight Bearing Restrictions:  No  Pain:   Session 1: Reports L anterior lower leg discomfort described as "pressure" located below where the AFO wraps around her anterior lower leg - defers intervention reporting it may be associated with her varicose veins.  Session 2: No complaints of pain during session.    Therapy/Group: Individual Therapy  Tawana Scale,  PT, DPT 08/21/2019, 8:00 AM

## 2019-08-21 NOTE — Progress Notes (Signed)
Occupational Therapy Session Note  Patient Details  Name: Abigail Moreno MRN: [id number] Date of Birth: Nov 26, 1928  Today's Date: 08/21/2019 OT Individual Time: 1100-1200 OT Individual Time Calculation (min): 60 min    Short Term Goals: Week 3:  OT Short Term Goal 1 (Week 3): Pt will complete toilet transfer with CGA stand pivot. OT Short Term Goal 2 (Week 3): Pt will complete toileting with min A. OT Short Term Goal 3 (Week 3): Pt will complete LB dressing min A OT Short Term Goal 4 (Week 3): Pt will complete UB dressing min A. OT Short Term Goal 5 (Week 3): Pt will stand with CGA as she pulls pants over hips with min A.  Skilled Therapeutic Interventions/Progress Updates:    Pt received in recliner ready for therapy. Discussed recent swelling in hand and importance of positioning.  Practiced using her incentive spirameter with cues for diaphramatic breathing.  No wheezing during OT session.  Pt transferred to wc with min A and then transported to gym.  Pt then transferred to mat to focus on L arm AROM.  Worked on active trunk extension for improved posture to allow for improved shoulder ROM.    Completed one set of exercises without estim and then one with estim.  For scapular retraction for B shoulders, pt held onto a dowel bar and pulled back with resistance from theraband wrapped around the bar.  She then worked on active sh flexion and elbow extension using the UE ranger.  Without UE ranger support she has 60 degrees of sh flexion, with it 70 degrees.  Applied saebo stim go to L deltoid, then pt repeated above exercises.   It was then left on for cyclic estim unattended for 45 min.  Saebo Stim Go 330 pulse width 35 Hz pulse rate On 8 sec/ off 8 sec Ramp up/ down 2 sec Symmetrical Biphasic wave form  Max intensity 13mA at 500 Ohm load  Her daughter arrived at the end of the session, discussed her shower set up and the modifications the daughter will need to make.  She  will need a tub bench.  Discussed and demonstrated retrograde massage to L hand and how pt can do active finger flex and ext with hand elevated.  No edema noted at the end of the session after she had been using her arm actively.   Pt taken back to her room for lunch, pt's dtr with her.    Therapy Documentation Precautions:  Precautions Precautions: Fall Precaution Comments: L hemiparesis Restrictions Weight Bearing Restrictions: No   Pain: Pain Assessment Pain Scale: 0-10 Pain Score: 0-No pain Faces Pain Scale: No hurt ADL: ADL Grooming: Setup Where Assessed-Grooming: Sitting at sink Upper Body Bathing: Supervision/safety Where Assessed-Upper Body Bathing: Shower Lower Body Bathing: Contact guard Where Assessed-Lower Body Bathing: Shower Upper Body Dressing: Minimal assistance Where Assessed-Upper Body Dressing: Edge of bed Lower Body Dressing: Moderate assistance Where Assessed-Lower Body Dressing: Wheelchair Toileting: Minimal assistance Where Assessed-Toileting: Glass blower/designer: Psychiatric nurse Method: Counselling psychologist: Energy manager: Environmental education officer Method: Heritage manager: Grab bars, Transfer tub bench   Therapy/Group: Individual Therapy  Willadene Mounsey 08/21/2019, 12:43 PM

## 2019-08-21 NOTE — Progress Notes (Signed)
Patient c/o increase wheezing and SOB while supine in bed. Elevated head of bed to 90 degrees, offer incentive spirometer w/ 3 deep breaths and cough. Patient did 2 rounds of deep breathing and cough thick, yellow mucus after. Assessed lungs, clear in both sides, vitals obtain with normal parameters. Patient stated relief from SOB, but continue to stay in semi-fowlers position due to slight wheezing. Patient is resting, will continue to monitor.

## 2019-08-21 NOTE — Plan of Care (Signed)
  Problem: Consults Goal: RH STROKE PATIENT EDUCATION Description: See Patient Education module for education specifics  Outcome: Progressing Goal: Nutrition Consult-if indicated Outcome: Progressing   Problem: RH BOWEL ELIMINATION Goal: RH STG MANAGE BOWEL WITH ASSISTANCE Description: STG Manage Bowel with min Assistance. Outcome: Progressing Goal: RH STG MANAGE BOWEL W/MEDICATION W/ASSISTANCE Description: STG Manage Bowel with Medication with min Assistance. Outcome: Progressing   Problem: RH BLADDER ELIMINATION Goal: RH STG MANAGE BLADDER WITH ASSISTANCE Description: STG Manage Bladder With min Assistance Outcome: Progressing   Problem: RH SKIN INTEGRITY Goal: RH STG MAINTAIN SKIN INTEGRITY WITH ASSISTANCE Description: STG Maintain Skin Integrity With min Assistance. Outcome: Progressing Goal: RH STG ABLE TO PERFORM INCISION/WOUND CARE W/ASSISTANCE Description: STG Able To Perform Incision/Wound Care With min Assistance. Outcome: Progressing   Problem: RH SAFETY Goal: RH STG ADHERE TO SAFETY PRECAUTIONS W/ASSISTANCE/DEVICE Description: STG Adhere to Safety Precautions With Cues and reminder Assistance/Device. Outcome: Progressing   Problem: RH COGNITION-NURSING Goal: RH STG USES MEMORY AIDS/STRATEGIES W/ASSIST TO PROBLEM SOLVE Description: STG Uses Memory Aids/Strategies With cues and reminders Assistance to Problem Solve. Outcome: Progressing Goal: RH STG ANTICIPATES NEEDS/CALLS FOR ASSIST W/ASSIST/CUES Description: STG Anticipates Needs/Calls for Assist With cues and reminders Assistance/Cues. Outcome: Progressing   Problem: RH PAIN MANAGEMENT Goal: RH STG PAIN MANAGED AT OR BELOW PT'S PAIN GOAL Description: Pain less than 2. Outcome: Progressing   Problem: RH KNOWLEDGE DEFICIT Goal: RH STG INCREASE KNOWLEDGE OF STROKE PROPHYLAXIS Description: Pt will be able to demonstrate understanding of adherence to medications to prevent stroke, and follow the DASH diet with  mod I assist using handouts. Outcome: Progressing

## 2019-08-22 ENCOUNTER — Inpatient Hospital Stay (HOSPITAL_COMMUNITY): Payer: PPO | Admitting: Physical Therapy

## 2019-08-22 ENCOUNTER — Inpatient Hospital Stay (HOSPITAL_COMMUNITY): Payer: PPO

## 2019-08-22 ENCOUNTER — Inpatient Hospital Stay (HOSPITAL_COMMUNITY): Payer: PPO | Admitting: Occupational Therapy

## 2019-08-22 LAB — CBC WITH DIFFERENTIAL/PLATELET
Abs Immature Granulocytes: 0 10*3/uL (ref 0.00–0.07)
Basophils Absolute: 0 10*3/uL (ref 0.0–0.1)
Basophils Relative: 0 %
Eosinophils Absolute: 0 10*3/uL (ref 0.0–0.5)
Eosinophils Relative: 0 %
HCT: 38 % (ref 36.0–46.0)
Hemoglobin: 11.9 g/dL — ABNORMAL LOW (ref 12.0–15.0)
Lymphocytes Relative: 94 %
Lymphs Abs: 23.2 10*3/uL — ABNORMAL HIGH (ref 0.7–4.0)
MCH: 30.5 pg (ref 26.0–34.0)
MCHC: 31.3 g/dL (ref 30.0–36.0)
MCV: 97.4 fL (ref 80.0–100.0)
Monocytes Absolute: 0 10*3/uL — ABNORMAL LOW (ref 0.1–1.0)
Monocytes Relative: 0 %
Neutro Abs: 1.5 10*3/uL — ABNORMAL LOW (ref 1.7–7.7)
Neutrophils Relative %: 6 %
Platelets: 116 10*3/uL — ABNORMAL LOW (ref 150–400)
RBC: 3.9 MIL/uL (ref 3.87–5.11)
RDW: 15.5 % (ref 11.5–15.5)
WBC: 24.7 10*3/uL — ABNORMAL HIGH (ref 4.0–10.5)
nRBC: 0 % (ref 0.0–0.2)

## 2019-08-22 LAB — BASIC METABOLIC PANEL
Anion gap: 7 (ref 5–15)
BUN: 19 mg/dL (ref 8–23)
CO2: 28 mmol/L (ref 22–32)
Calcium: 8.7 mg/dL — ABNORMAL LOW (ref 8.9–10.3)
Chloride: 103 mmol/L (ref 98–111)
Creatinine, Ser: 0.48 mg/dL (ref 0.44–1.00)
GFR calc Af Amer: >60 mL/min (ref >60)
GFR calc non Af Amer: >60 mL/min (ref >60)
Glucose, Bld: 81 mg/dL (ref 70–99)
Potassium: 3.8 mmol/L (ref 3.5–5.1)
Sodium: 138 mmol/L (ref 135–145)

## 2019-08-22 LAB — PATHOLOGIST SMEAR REVIEW

## 2019-08-22 NOTE — Progress Notes (Signed)
Occupational Therapy Session Note  Patient Details  Name: Abigail Moreno MRN: [id number] Date of Birth: [birth year]  Today's Date: 08/22/2019 OT Individual Time: 0850-1000 OT Individual Time Calculation (min): 70 min    Short Term Goals: Week 1:  OT Short Term Goal 1 (Week 1): Pt will don shirt with contact guard without chair support OT Short Term Goal 1 - Progress (Week 1): Progressing toward goal OT Short Term Goal 2 (Week 1): Pt will perform sit to stand with min A consistently for tranfsers and clothing management OT Short Term Goal 2 - Progress (Week 1): Met OT Short Term Goal 3 (Week 1): Pt will performed toilet transfers with min A with LRAD OT Short Term Goal 3 - Progress (Week 1): Progressing toward goal OT Short Term Goal 4 (Week 1): Pt will perform one grooming tasks in standing with contact guard. OT Short Term Goal 4 - Progress (Week 1): Met  Skilled Therapeutic Interventions/Progress Updates:    1;1. Pt recived in w/c agreeable to bathing and dressing. Pt bathes with HOH A of LUE to reach around and wash buttocks. Pt using LHSS to wash BLE. Pt completes dressing seated in w/c with VC for hemi dressing and reacher use to thread BLE into pants. Pt sit to stand with S overall but requires HOH A to pull pants up buttocks with LUE. Pt total A to don teds, don L shoe and AFO and fasten. Pt grooms at sink with set up with no Vc for incorporating LUE into task. Exited session with pt seated in w/c, exit alarm on and call light in reach  Therapy Documentation Precautions:  Precautions Precautions: Fall Precaution Comments: L hemiparesis Restrictions Weight Bearing Restrictions: No General:   Vital Signs:   Pain:   ADL: ADL Grooming: Setup Where Assessed-Grooming: Sitting at sink Upper Body Bathing: Supervision/safety Where Assessed-Upper Body Bathing: Shower Lower Body Bathing: Contact guard Where Assessed-Lower Body Bathing: Shower Upper Body Dressing: Minimal  assistance Where Assessed-Upper Body Dressing: Edge of bed Lower Body Dressing: Moderate assistance Where Assessed-Lower Body Dressing: Wheelchair Toileting: Minimal assistance Where Assessed-Toileting: Glass blower/designer: Psychiatric nurse Method: Counselling psychologist: Energy manager: Environmental education officer Method: Heritage manager: Grab bars, Nurse, learning disability    Praxis   Exercises:   Other Treatments:     Therapy/Group: Individual Therapy  Tonny Branch 08/22/2019, 9:35 AM

## 2019-08-22 NOTE — Progress Notes (Signed)
Occupational Therapy Weekly Progress Note  Patient Details  Name: Abigail Moreno MRN: [id number] Date of Birth: September 23, 1928  Beginning of progress report period: August 15, 2019 End of progress report period: August 22, 2019  Today's Date: 08/22/2019 OT Individual Time: 1045-1200 OT Individual Time Calculation (min): 75 min    Patient has met 3 of 4 short term goals.  Pt continues to need mod A with LB dressing due to limited L hand grasp.  She has improved a great deal with her L shoulder AROM to 60 degrees of flexion and 60 of abduction, finger grasp (3+/5) and extension, sit to stand, standing balance, and ambulation with RW.  She is a very motivated patient and always puts in a great deal of effort.    Patient continues to demonstrate the following deficits: muscle weakness, decreased cardiorespiratoy endurance, unbalanced muscle activation and decreased coordination and decreased standing balance, hemiplegia and decreased balance strategies and therefore will continue to benefit from skilled OT intervention to enhance overall performance with BADL.  Patient progressing toward long term goals..  Continue plan of care.  Due to limited L hand grasp and difficulty reaching around torso with R arm, LB dressing and toileting downgraded from Supervision to min A.    OT Short Term Goals Week 3:  OT Short Term Goal 1 (Week 3): Pt will complete toilet transfer with CGA stand pivot. OT Short Term Goal 1 - Progress (Week 3): Met OT Short Term Goal 2 (Week 3): Pt will complete toileting with min A. OT Short Term Goal 2 - Progress (Week 3): Met OT Short Term Goal 3 (Week 3): Pt will complete LB dressing min A OT Short Term Goal 3 - Progress (Week 3): Progressing toward goal OT Short Term Goal 4 (Week 3): Pt will complete UB dressing min A. OT Short Term Goal 4 - Progress (Week 3): Met OT Short Term Goal 5 (Week 3): Pt will stand with CGA as she pulls pants over hips with min A. OT Short  Term Goal 5 - Progress (Week 3): Met Week 4:  OT Short Term Goal 1 (Week 4): STGs = LTGs  Skilled Therapeutic Interventions/Progress Updates:    Pt received in w/c ready for therapy.  L hand and forearm swollen, retrograde massage and coban wrap applied for light compression. Pt taken to gym and transferred to therapy mat. Session focused on LUE AROM and function: -placing and removing 1 inch pegs into board -placing pillow case on pillow min A -folding towel without  -push/pull exercises for sh flex/ext pulling on handle of elevated stool -shoulder abduction and external rotation with zoom ball - sh reach with B hands on dowel bar hitting soft ball away ("baseball activity") - sit to stand with close S from elevated mat holding dowel bar in B hands and then once standing reaching bar to shoulder height 5x for 4 sets.  Pt then needed to toilet. Stand pivot to toilet with min A and then mod A to stand from lower toilet. She can usually do this with min A but pt feeling fatigued after all the exercises.  She can use L hand to grab pants but is not strong enough to pull them up.    saebo stim go applied to L deltoid for unattended cyclic estim.  Pt used for 55 min.  Pt tolerated well.  Saebo Stim Go 330 pulse width 35 Hz pulse rate On 8 sec/ off 8 sec Ramp up/ down 2 sec Symmetrical Biphasic  wave form  Max intensity 167m at 500 Ohm load  Pt resting in recliner at end of session with all needs met. Chair pad alarm on.  Therapy Documentation Precautions:  Precautions Precautions: Fall Precaution Comments: L hemiparesis Restrictions Weight Bearing Restrictions: No    Pain: Pain Assessment Pain Scale: 0-10 Pain Score: 0-No pain Faces Pain Scale: No hurt ADL: ADL Grooming: Setup Where Assessed-Grooming: Sitting at sink Upper Body Bathing: Supervision/safety Where Assessed-Upper Body Bathing: Shower Lower Body Bathing: Contact guard Where Assessed-Lower Body Bathing:  Shower Upper Body Dressing: Minimal assistance Where Assessed-Upper Body Dressing: Edge of bed Lower Body Dressing: Moderate assistance Where Assessed-Lower Body Dressing: Wheelchair Toileting: Minimal assistance Where Assessed-Toileting: TGlass blower/designer MPsychiatric nurseMethod: ACounselling psychologist GEnergy manager MEnvironmental education officerMethod: AHeritage manager Grab bars, Transfer tub bench   Therapy/Group: Individual Therapy  [place]12/12/2018, 12:42 PM

## 2019-08-22 NOTE — Plan of Care (Signed)
  Problem: Consults Goal: RH STROKE PATIENT EDUCATION Description: See Patient Education module for education specifics  Outcome: Progressing Goal: Nutrition Consult-if indicated Outcome: Progressing   Problem: RH BOWEL ELIMINATION Goal: RH STG MANAGE BOWEL WITH ASSISTANCE Description: STG Manage Bowel with min Assistance. Outcome: Progressing Goal: RH STG MANAGE BOWEL W/MEDICATION W/ASSISTANCE Description: STG Manage Bowel with Medication with min Assistance. Outcome: Progressing   Problem: RH BLADDER ELIMINATION Goal: RH STG MANAGE BLADDER WITH ASSISTANCE Description: STG Manage Bladder With min Assistance Outcome: Progressing   Problem: RH SKIN INTEGRITY Goal: RH STG MAINTAIN SKIN INTEGRITY WITH ASSISTANCE Description: STG Maintain Skin Integrity With min Assistance. Outcome: Progressing Goal: RH STG ABLE TO PERFORM INCISION/WOUND CARE W/ASSISTANCE Description: STG Able To Perform Incision/Wound Care With min Assistance. Outcome: Progressing   Problem: RH SAFETY Goal: RH STG ADHERE TO SAFETY PRECAUTIONS W/ASSISTANCE/DEVICE Description: STG Adhere to Safety Precautions With Cues and reminder Assistance/Device. Outcome: Progressing   Problem: RH COGNITION-NURSING Goal: RH STG USES MEMORY AIDS/STRATEGIES W/ASSIST TO PROBLEM SOLVE Description: STG Uses Memory Aids/Strategies With cues and reminders Assistance to Problem Solve. Outcome: Progressing Goal: RH STG ANTICIPATES NEEDS/CALLS FOR ASSIST W/ASSIST/CUES Description: STG Anticipates Needs/Calls for Assist With cues and reminders Assistance/Cues. Outcome: Progressing   Problem: RH PAIN MANAGEMENT Goal: RH STG PAIN MANAGED AT OR BELOW PT'S PAIN GOAL Description: Pain less than 2. Outcome: Progressing   Problem: RH KNOWLEDGE DEFICIT Goal: RH STG INCREASE KNOWLEDGE OF STROKE PROPHYLAXIS Description: Pt will be able to demonstrate understanding of adherence to medications to prevent stroke, and follow the DASH diet with  mod I assist using handouts. Outcome: Progressing

## 2019-08-22 NOTE — Progress Notes (Signed)
[facility] PHYSICAL MEDICINE & REHABILITATION PROGRESS NOTE  Subjective/Complaints: Patient seen sitting up this morning.  She states she slept well overnight.  She notes improvement in wheezing.  She states she is using a flutter valve.  ROS: Denies CP, SOB, N/V/D  Objective: Vital Signs: Blood pressure 119/69, pulse 65, temperature 98.1 F (36.7 C), resp. rate 17, height 5' (1.524 m), weight 63.9 kg, SpO2 92 %. Dg Chest 2 View  Result Date: 08/21/2019 CLINICAL DATA:  Productive cough. EXAM: CHEST - 2 VIEW COMPARISON:  07/30/2019 FINDINGS: Lungs are adequately inflated without focal airspace consolidation or effusion. Mild stable cardiomegaly. Remainder of the exam is unchanged. IMPRESSION: Stable exam. No active cardiopulmonary disease. Electronically Signed   By: Marin Olp M.D.   On: 08/21/2019 15:15   Vas [place] Upper Extremity Venous Duplex  Result Date: 08/21/2019 UPPER VENOUS STUDY  Indications: Edema Limitations: Restricted mobility. Comparison Study: No prior study. Performing Technologist: Maudry Mayhew MHA, RDMS, RVT, RDCS  Examination Guidelines: A complete evaluation includes B-mode imaging, spectral Doppler, color Doppler, and power Doppler as needed of all accessible portions of each vessel. Bilateral testing is considered an integral part of a complete examination. Limited examinations for reoccurring indications may be performed as noted.  Right Findings: +----------+------------+---------+-----------+----------+--------------+ RIGHT     CompressiblePhasicitySpontaneousProperties   Summary     +----------+------------+---------+-----------+----------+--------------+ Subclavian                                          Not visualized +----------+------------+---------+-----------+----------+--------------+  Left Findings: +----------+------------+---------+-----------+----------+--------------+ LEFT      CompressiblePhasicitySpontaneousProperties   Summary      +----------+------------+---------+-----------+----------+--------------+ IJV           Full                                                 +----------+------------+---------+-----------+----------+--------------+ Subclavian    Full                                                 +----------+------------+---------+-----------+----------+--------------+ Axillary      Full                                                 +----------+------------+---------+-----------+----------+--------------+ Brachial      Full                                                 +----------+------------+---------+-----------+----------+--------------+ Radial        Full                                                 +----------+------------+---------+-----------+----------+--------------+ Ulnar         Full                                                 +----------+------------+---------+-----------+----------+--------------+  Cephalic                                            Not visualized +----------+------------+---------+-----------+----------+--------------+ Basilic       Full                                                 +----------+------------+---------+-----------+----------+--------------+  Summary:  Left: No evidence of deep vein thrombosis in the upper extremity. No evidence of superficial vein thrombosis in the upper extremity. This was a limited study.  *See table(s) above for measurements and observations.  Diagnosing physician: Servando Snare MD Electronically signed by Servando Snare MD on 08/21/2019 at 6:12:56 PM.    Final    Recent Labs    08/20/19 0513 08/22/19 0519  WBC 22.3* 24.7*  HGB 12.2 11.9*  HCT 38.1 38.0  PLT 106* 116*   Recent Labs    08/22/19 0519  NA 138  K 3.8  CL 103  CO2 28  GLUCOSE 81  BUN 19  CREATININE 0.48  CALCIUM 8.7*    Physical Exam: BP 119/69 (BP Location: Right Arm)   Pulse 65   Temp 98.1 F (36.7 C)   Resp  17   Ht 5' (1.524 m)   Wt 63.9 kg   SpO2 92%   BMI 27.51 kg/m  Constitutional: No distress . Vital signs reviewed. HENT: Normocephalic.  Atraumatic. Eyes: EOMI. No discharge. Cardiovascular: No JVD. Respiratory: Normal effort.  No stridor. GI: Non-distended. Skin: Warm and dry.  Intact. Psych: Normal mood.  Normal behavior. Musc: Bilateral hallux valgus deformities Left upper extremity edema improving Neurological: Alert Follows simple commands.   HOH Motor:  Left upper extremity: Shoulder abduction, elbow flexion/extension 4-/5, handgrip 3+-4 -/5 with apraxia, improving Left lower extremity: 4-/5 hip flexion, knee extension, 3+/5 ankle dorsiflexion, stable  Assessment/Plan: 1. Functional deficits secondary to right pontine infarct which require 3+ hours per day of interdisciplinary therapy in a comprehensive inpatient rehab setting.  Physiatrist is providing close team supervision and 24 hour management of active medical problems listed below.  Physiatrist and rehab team continue to assess barriers to discharge/monitor patient progress toward functional and medical goals  Care Tool:  Bathing    Body parts bathed by patient: Left arm, Chest, Abdomen, Right upper leg, Left upper leg, Face, Front perineal area, Right lower leg, Left lower leg, Right arm, Buttocks   Body parts bathed by helper: Buttocks     Bathing assist Assist Level: Contact Guard/Touching assist     Upper Body Dressing/Undressing Upper body dressing   What is the patient wearing?: Pull over shirt    Upper body assist Assist Level: Minimal Assistance - Patient > 75%    Lower Body Dressing/Undressing Lower body dressing      What is the patient wearing?: Pants, Underwear/pull up     Lower body assist Assist for lower body dressing: Moderate Assistance - Patient 50 - 74%     Toileting Toileting    Toileting assist Assist for toileting: Minimal Assistance - Patient > 75%      Transfers Chair/bed transfer  Transfers assist     Chair/bed transfer assist level: Contact Guard/Touching assist Chair/bed transfer assistive device: Programmer, multimedia   Ambulation assist  Assist level: Contact Guard/Touching assist Assistive device: Walker-rolling Max distance: 9ft   Walk 10 feet activity   Assist     Assist level: Contact Guard/Touching assist Assistive device: Walker-rolling   Walk 50 feet activity   Assist Walk 50 feet with 2 turns activity did not occur: Safety/medical concerns  Assist level: Contact Guard/Touching assist Assistive device: Walker-rolling    Walk 150 feet activity   Assist Walk 150 feet activity did not occur: Safety/medical concerns  Assist level: 2 helpers Assistive device: Lite Gait    Walk 10 feet on uneven surface  activity   Assist Walk 10 feet on uneven surfaces activity did not occur: Safety/medical concerns         Wheelchair     Assist Will patient use wheelchair at discharge?: No(tbd)             Wheelchair 50 feet with 2 turns activity    Assist            Wheelchair 150 feet activity     Assist            Medical Problem List and Plan: 1.  Left-sided weakness secondary to small right pontine infarction secondary small vessel disease on 07/30/2019.  Continue CIR 2.  Antithrombotics: -DVT/anticoagulation: Lovenox  Left upper extremity Doppler reviewed, negative for DVT             -antiplatelet therapy: Aspirin 81 mg daily and Plavix 75 mg daily x3 weeks then aspirin alone 3. Pain Management: Tylenol as needed for right shoulder pain  Baclofen nightly started on 11/15 for spasticity, increased to 20 on 11/17, increased to 30 on 11/20 with improvement 4. Mood: Xanax 0.25 mg bid scheduled, in addition to q12 prn doses  Lexapro 5 daily started on 11/23, appears improved  Team to provide emotional support             -antipsychotic agents:  N/A 5. Neuropsych: This patient is capable of making decisions on her own behalf. 6. Skin/Wound Care: Routine skin checks 7. Fluids/Electrolytes/Nutrition: Routine in and outs.    BMP within acceptable range on 12/4 8.  History of CLL.  Followed outpatient by Dr. Marko Plume.    Afebrile  WBCs 24.7 on 12/4  Recheck Wednesday 9.  Hypertension.  Patient on Lotrel 5-20 mg daily as needed prior to admission.  Labile on 12/4             Monitor with increased mobility 10.  Thrombocytopenia             Platelets 116 on 12/4  Recheck Wednesday 11.  Sleep disturbance:   See #3,4  Chamomile tea ordered as well  Improving overall 12. Hypoalbuminemia   Supplement initiated on 11/17 13. Slow transit constipation  Improving 14.  Acute blood loss anemia  Hemoglobin 11.9 on 12/4 15.  Macular degeneration  Home PreserVision tabs ordered 16.  Intermittent wheezing  Per discussion with therapies, intermittent and relieved with activity  With noted purulent sputum by nursing  Sputum culture ordered, pending  Chest x-ray personally reviewed, unremarkable for infection  Flutter valve ordered, continue  Albuterol as needed ordered   Improving  LOS: 21 days A FACE TO FACE EVALUATION WAS PERFORMED  Rushawn Capshaw Lorie Phenix 08/22/2019, 10:11 AM

## 2019-08-22 NOTE — Progress Notes (Signed)
Physical Therapy Weekly Progress Note  Patient Details  Name: Abigail Moreno MRN: [id number] Date of Birth: [birth year]  Beginning of progress report period: August 15, 2019 End of progress report period: August 22, 2019  Today's Date: 08/22/2019 PT Individual Time: 1420-1530 PT Individual Time Calculation (min): 70 min   Patient has met 3 of 3 short term goals. [name] has mad significant progress this week performing transfers using RW with primarily CGA and only requiring min assist to lift from a low surface, ambulating up to 185f using RW with CGA for steadying (occasional min assist), and ascending/descending 4 steps using L HR with as little as min assist but can require up to mod assist when fatigued. Patient is demonstrating improvement in L LE foot clearance and step length during gait as well as improved recall and carryover of proper sequencing of transfers all with less cuing - this demonstrates decreased fall risk and increasing pt independence to lessen the caregiver burden.   Patient continues to demonstrate the following deficits muscle weakness and muscle joint tightness, decreased cardiorespiratoy endurance, impaired timing and sequencing, abnormal tone, unbalanced muscle activation, motor apraxia, decreased coordination and decreased motor planning, decreased attention to left, decreased initiation, decreased attention, decreased memory and delayed processing and decreased sitting balance, decreased standing balance, decreased postural control and decreased balance strategies and therefore will continue to benefit from skilled PT intervention to increase functional independence with mobility.  Patient progressing toward long term goals..  Continue plan of care.  PT Short Term Goals Week 3:  PT Short Term Goal 1 (Week 3): Patient will perform functional transfers using LRAD with CGA PT Short Term Goal 1 - Progress (Week 3): Met PT Short Term Goal 2 (Week 3): Pt  will ambulate at least 553fusing LRAD with CGA PT Short Term Goal 2 - Progress (Week 3): Met PT Short Term Goal 3 (Week 3): Pt will ascend/descend 4 steps using L handrail with min assist PT Short Term Goal 3 - Progress (Week 3): Met Week 4:  PT Short Term Goal 1 (Week 4): = to LTGs based on ELOS  Skilled Therapeutic Interventions/Progress Updates:  Ambulation/gait training;DME/adaptive equipment instruction;Neuromuscular re-education;Psychosocial support;Stair training;UE/LE Strength taining/ROM;Balance/vestibular training;Discharge planning;Pain management;Skin care/wound management;Therapeutic Activities;UE/LE Coordination activities;Cognitive remediation/compensation;Disease management/prevention;Functional mobility training;Patient/family education;Splinting/orthotics;Therapeutic Exercise;Visual/perceptual remediation/compensation;Functional electrical stimulation;Community reintegration;Wheelchair propulsion/positioning   Pt received sitting in recliner and agreeable to therapy session. Sit>stand recliner>RW with min assist for lifting into standing due to posterior lean coming up from low surface. Gait training ~15022fith CGA to main therapy gym and 2x min assist due to minor LOB with w/c follow in event of fatigue - pt demonstrating overall increased L LE foot clearance and step length requiring intermittent cuing for improvement. Performed seated L UE NMR and bimanual task of tossing a ball with pt demonstrating significant difficulty performing L forearm supination for catching and poor timing and muscle activation for tossing ball. Standing with B LEs on red foam wedge to force anterior weight shift onto her toes while completing L UE NMR task of creating simple pipe tree pattern with varying min/max assist for L UE management of pipe parts depending on their complexity - this task took increased time to complete. Gait training ~43f69f stairs using RW with CGA and pt continuing to demonstrate  improving L LE foot clearance and step length with min cuing. Sit<>stands using RW to/from w/c or mat with varying CGA or min assist for coming to standing with mod cuing for increased  anterior weight shift to bring weight forward through her toes. Ascended/descended 4 step using B UE support on L HR and side step pattern with heavy min assist for lifting up onto step; however, pt demonstrating improving ability to perform this; and min assist for increased L hip abduction for foot placement on descent. Standing with B UE support on // bars performed B LE heel raises x 15 reps with cuing for increased L heel clearance. Gait training ~127f back to room using RW with CGA for steadying and pt requesting to use bathroom - ambulated in/out of bathroom with min assist for balance going up/down threshold in/out of bathroom. Standing with CGA for steadying performed LB clothing management with mod assist for time management. Sit<>stand on/off toilet with min assist for lifting and lowering - pt continent of bladder and performed seated peri-care without assistance. Ambulated ~161fto sink using RW with CGA for steadying and performed standing hand hygiene with CGA. Pt left seated in recliner with needs in reach, B LEs elevated, and chair alarm on.  Therapy Documentation Precautions:  Precautions Precautions: Fall Precaution Comments: L hemiparesis Restrictions Weight Bearing Restrictions: No  Pain: Denies pain during session.  Therapy/Group: Individual Therapy  Joetta Delprado M Keatyn Luck,PT, DPT 08/22/2019, 12:58 PM

## 2019-08-23 ENCOUNTER — Ambulatory Visit (HOSPITAL_COMMUNITY): Payer: PPO | Admitting: Physical Therapy

## 2019-08-23 NOTE — Plan of Care (Signed)
  Problem: Consults Goal: RH STROKE PATIENT EDUCATION Description: See Patient Education module for education specifics  Outcome: Progressing Goal: Nutrition Consult-if indicated Outcome: Progressing   Problem: RH BOWEL ELIMINATION Goal: RH STG MANAGE BOWEL WITH ASSISTANCE Description: STG Manage Bowel with min Assistance. Outcome: Progressing Goal: RH STG MANAGE BOWEL W/MEDICATION W/ASSISTANCE Description: STG Manage Bowel with Medication with min Assistance. Outcome: Progressing   Problem: RH BLADDER ELIMINATION Goal: RH STG MANAGE BLADDER WITH ASSISTANCE Description: STG Manage Bladder With min Assistance Outcome: Progressing   Problem: RH SKIN INTEGRITY Goal: RH STG MAINTAIN SKIN INTEGRITY WITH ASSISTANCE Description: STG Maintain Skin Integrity With min Assistance. Outcome: Progressing Goal: RH STG ABLE TO PERFORM INCISION/WOUND CARE W/ASSISTANCE Description: STG Able To Perform Incision/Wound Care With min Assistance. Outcome: Progressing   Problem: RH SAFETY Goal: RH STG ADHERE TO SAFETY PRECAUTIONS W/ASSISTANCE/DEVICE Description: STG Adhere to Safety Precautions With Cues and reminder Assistance/Device. Outcome: Progressing   Problem: RH COGNITION-NURSING Goal: RH STG USES MEMORY AIDS/STRATEGIES W/ASSIST TO PROBLEM SOLVE Description: STG Uses Memory Aids/Strategies With cues and reminders Assistance to Problem Solve. Outcome: Progressing Goal: RH STG ANTICIPATES NEEDS/CALLS FOR ASSIST W/ASSIST/CUES Description: STG Anticipates Needs/Calls for Assist With cues and reminders Assistance/Cues. Outcome: Progressing   Problem: RH PAIN MANAGEMENT Goal: RH STG PAIN MANAGED AT OR BELOW PT'S PAIN GOAL Description: Pain less than 2. Outcome: Progressing   Problem: RH KNOWLEDGE DEFICIT Goal: RH STG INCREASE KNOWLEDGE OF STROKE PROPHYLAXIS Description: Pt will be able to demonstrate understanding of adherence to medications to prevent stroke, and follow the DASH diet with  mod I assist using handouts. Outcome: Progressing

## 2019-08-23 NOTE — Progress Notes (Signed)
PHYSICAL MEDICINE & REHABILITATION PROGRESS NOTE  Subjective/Complaints:  Sleeping in chair but awakens easily   ROS: Denies CP, SOB, N/V/D  Objective: Vital Signs: Blood pressure 127/78, pulse 65, temperature 97.7 F (36.5 C), resp. rate 18, height 5' (1.524 m), weight 63.9 kg, SpO2 93 %. Dg Chest 2 View  Result Date: 08/21/2019 CLINICAL DATA:  Productive cough. EXAM: CHEST - 2 VIEW COMPARISON:  07/30/2019 FINDINGS: Lungs are adequately inflated without focal airspace consolidation or effusion. Mild stable cardiomegaly. Remainder of the exam is unchanged. IMPRESSION: Stable exam. No active cardiopulmonary disease. Electronically Signed   By: Marin Olp M.D.   On: 08/21/2019 15:15   Vas [place] Upper Extremity Venous Duplex  Result Date: 08/21/2019 UPPER VENOUS STUDY  Indications: Edema Limitations: Restricted mobility. Comparison Study: No prior study. Performing Technologist: Maudry Mayhew MHA, RDMS, RVT, RDCS  Examination Guidelines: A complete evaluation includes B-mode imaging, spectral Doppler, color Doppler, and power Doppler as needed of all accessible portions of each vessel. Bilateral testing is considered an integral part of a complete examination. Limited examinations for reoccurring indications may be performed as noted.  Right Findings: +----------+------------+---------+-----------+----------+--------------+ RIGHT     CompressiblePhasicitySpontaneousProperties   Summary     +----------+------------+---------+-----------+----------+--------------+ Subclavian                                          Not visualized +----------+------------+---------+-----------+----------+--------------+  Left Findings: +----------+------------+---------+-----------+----------+--------------+ LEFT      CompressiblePhasicitySpontaneousProperties   Summary     +----------+------------+---------+-----------+----------+--------------+ IJV           Full                                                  +----------+------------+---------+-----------+----------+--------------+ Subclavian    Full                                                 +----------+------------+---------+-----------+----------+--------------+ Axillary      Full                                                 +----------+------------+---------+-----------+----------+--------------+ Brachial      Full                                                 +----------+------------+---------+-----------+----------+--------------+ Radial        Full                                                 +----------+------------+---------+-----------+----------+--------------+ Ulnar         Full                                                 +----------+------------+---------+-----------+----------+--------------+  Cephalic                                            Not visualized +----------+------------+---------+-----------+----------+--------------+ Basilic       Full                                                 +----------+------------+---------+-----------+----------+--------------+  Summary:  Left: No evidence of deep vein thrombosis in the upper extremity. No evidence of superficial vein thrombosis in the upper extremity. This was a limited study.  *See table(s) above for measurements and observations.  Diagnosing physician: Servando Snare MD Electronically signed by Servando Snare MD on 08/21/2019 at 6:12:56 PM.    Final    Recent Labs    08/22/19 0519  WBC 24.7*  HGB 11.9*  HCT 38.0  PLT 116*   Recent Labs    08/22/19 0519  NA 138  K 3.8  CL 103  CO2 28  GLUCOSE 81  BUN 19  CREATININE 0.48  CALCIUM 8.7*    Physical Exam: BP 127/78 (BP Location: Right Arm)   Pulse 65   Temp 97.7 F (36.5 C)   Resp 18   Ht 5' (1.524 m)   Wt 63.9 kg   SpO2 93%   BMI 27.51 kg/m  Constitutional: No distress . Vital signs reviewed. HENT: Normocephalic.   Atraumatic. Eyes: EOMI. No discharge. Cardiovascular: No JVD. Respiratory: Normal effort.  No stridor. GI: Non-distended. Skin: Warm and dry.  Intact. Psych: Normal mood.  Normal behavior. Musc: Bilateral hallux valgus deformities Left upper extremity edema improving Neurological: Alert Follows simple commands.   HOH Motor:  Left upper extremity: Shoulder abduction, elbow flexion/extension 4-/5, handgrip 3+-4 -/5 with apraxia, unchanged Left lower extremity: 4-/5 hip flexion, knee extension, 3+/5 ankle dorsiflexion, stable  Assessment/Plan: 1. Functional deficits secondary to right pontine infarct which require 3+ hours per day of interdisciplinary therapy in a comprehensive inpatient rehab setting.  Physiatrist is providing close team supervision and 24 hour management of active medical problems listed below.  Physiatrist and rehab team continue to assess barriers to discharge/monitor patient progress toward functional and medical goals  Care Tool:  Bathing    Body parts bathed by patient: Left arm, Chest, Abdomen, Right upper leg, Left upper leg, Face, Front perineal area, Right lower leg, Left lower leg, Right arm, Buttocks   Body parts bathed by helper: Buttocks     Bathing assist Assist Level: Contact Guard/Touching assist     Upper Body Dressing/Undressing Upper body dressing   What is the patient wearing?: Pull over shirt    Upper body assist Assist Level: Minimal Assistance - Patient > 75%    Lower Body Dressing/Undressing Lower body dressing      What is the patient wearing?: Pants, Underwear/pull up     Lower body assist Assist for lower body dressing: Moderate Assistance - Patient 50 - 74%     Toileting Toileting    Toileting assist Assist for toileting: Minimal Assistance - Patient > 75%     Transfers Chair/bed transfer  Transfers assist     Chair/bed transfer assist level: Minimal Assistance - Patient > 75% Chair/bed transfer assistive  device: Programmer, multimedia   Ambulation assist  Assist level: Contact Guard/Touching assist Assistive device: Walker-rolling Max distance: 170ft   Walk 10 feet activity   Assist     Assist level: Contact Guard/Touching assist Assistive device: Walker-rolling   Walk 50 feet activity   Assist Walk 50 feet with 2 turns activity did not occur: Safety/medical concerns  Assist level: Contact Guard/Touching assist Assistive device: Walker-rolling    Walk 150 feet activity   Assist Walk 150 feet activity did not occur: Safety/medical concerns  Assist level: Contact Guard/Touching assist Assistive device: Walker-rolling    Walk 10 feet on uneven surface  activity   Assist Walk 10 feet on uneven surfaces activity did not occur: Safety/medical concerns         Wheelchair     Assist Will patient use wheelchair at discharge?: No(tbd)             Wheelchair 50 feet with 2 turns activity    Assist            Wheelchair 150 feet activity     Assist            Medical Problem List and Plan: 1.  Left-sided weakness secondary to small right pontine infarction secondary small vessel disease on 07/30/2019.  Continue CIR PT, OT  2.  Antithrombotics: -DVT/anticoagulation: Lovenox  Left upper extremity Doppler reviewed, negative for DVT             -antiplatelet therapy: Aspirin 81 mg daily and Plavix 75 mg daily x3 weeks then aspirin alone 3. Pain Management: Tylenol as needed for right shoulder pain  Baclofen nightly started on 11/15 for spasticity, increased to 20 on 11/17, increased to 30 on 11/20 with improvement 4. Mood: Xanax 0.25 mg bid scheduled, in addition to q12 prn doses  Lexapro 5 daily started on 11/23, appears improved  Team to provide emotional support             -antipsychotic agents: N/A 5. Neuropsych: This patient is capable of making decisions on her own behalf. 6. Skin/Wound Care: Routine skin  checks 7. Fluids/Electrolytes/Nutrition: Routine in and outs.    BMP within acceptable range on 12/4 8.  History of CLL.  Followed outpatient by Dr. Marko Plume.    Afebrile  WBCs 24.7 on 12/4  Recheck Wednesday 9.  Hypertension.  Patient on Lotrel 5-20 mg daily as needed prior to admission.  Labile on 12/4             Monitor with increased mobility 10.  Thrombocytopenia             Platelets 116 on 12/4  Recheck Wednesday 11.  Sleep disturbance:   See #3,4  Chamomile tea ordered as well  Improving overall 12. Hypoalbuminemia   Supplement initiated on 11/17 13. Slow transit constipation  Improving 14.  Acute blood loss anemia  Hemoglobin 11.9 on 12/4 15.  Macular degeneration  Home PreserVision tabs ordered 16.  Intermittent wheezing- inaudible today except with auscultation   Per discussion with therapies, intermittent and relieved with activity  Afebrile   Sputum culture ordered, pending  Chest x-ray personally reviewed, unremarkable for infection  Flutter valve ordered, continue  Albuterol as needed ordered   Improving  LOS: 22 days A FACE TO FACE EVALUATION WAS PERFORMED  Charlett Blake 08/23/2019, 8:56 AM

## 2019-08-23 NOTE — Progress Notes (Signed)
Physical Therapy Session Note  Patient Details  Name: Abigail Moreno MRN: [id number] Date of Birth: [birth year]  Today's Date: 08/23/2019 PT Individual Time: [id number] PT Individual Time Calculation (min): 64 min   Short Term Goals: Week 4:  PT Short Term Goal 1 (Week 4): = to LTGs based on ELOS  Skilled Therapeutic Interventions/Progress Updates:   Pt received sitting in w/c and agreeable to therapy session. Donned jacket with mod assist for pulling around back and threading R arm.  Transported to/from gym in w/c. Sit<>stands using RW with CGA and pt demonstrating improving anterior trunk weight shift/lean and able to sequence steps of transfer without cuing. Gait training 145ft using RW with CGA and cuing throughout for increased L LE foot clearance and increasing gait speed 3x standing rest break during and pt reporting some R shoulder discomfort due to prolonged weightbearing. Gait training 42ft without AD with min/mod assist for balance and manual facilitation for R/L lateral weight shift during stance and for increased gait speeds as well as cuing for increased L LE foot clearance. Standing with B UE support on RW performed L LE NMR task targeting improved hip/knee flexion during swing of stepping forwards/backwards over orange tube with CGA/min assist for balance to pt fatigue. Gait training ~59ft using RW to stairs with CGA for steadying and manual facilitation for increased R lateral weight shift on stance limb for increased L LE foot clearance during swing. Ascended/descended 4 steps using L HR with side step pattern and min assist for lifting up onto step during ascent and CGA initially for descent but then on 3rd and 4th step required min assist for increased L hip abduction for improved foot placement - pt demonstrating significantly improving motor planning for this task. Pt requesting to return to room for toileting and to change LB clothing. Transported back to room in w/c. Gait  training ~37ft x2 in/out of bathroom using RW with CGA and min assist for balance when going over threshold. Stand>sit on toilet with CGA and sit>stand with min assist for lifting into stance. Sitting on toilet performed LB clothing management with max assist for threading LEs and in standing with CGA for steadying pulled pants over hips with mod assist for time management and due to L UE paresis pt unable to grasp and pull pants up. Pt left sitting in recliner with needs in reach, chair alarm on, and  BLEs elevated for edema management.   Therapy Documentation Precautions:  Precautions Precautions: Fall Precaution Comments: L hemiparesis Restrictions Weight Bearing Restrictions: No  Pain:   Some R shoulder discomfort when ambulating long distance with RW due to prolonged weightbearing - provided seated rest breaks for pain management   Therapy/Group: Individual Therapy  Tawana Scale, PT, DPT 08/23/2019, 2:25 PM

## 2019-08-24 ENCOUNTER — Inpatient Hospital Stay (HOSPITAL_COMMUNITY): Payer: PPO | Admitting: Physical Therapy

## 2019-08-24 ENCOUNTER — Inpatient Hospital Stay (HOSPITAL_COMMUNITY): Payer: PPO

## 2019-08-24 LAB — EXPECTORATED SPUTUM ASSESSMENT W GRAM STAIN, RFLX TO RESP C

## 2019-08-24 NOTE — Progress Notes (Signed)
Physical Therapy Session Note  Patient Details  Name: Abigail Moreno MRN: [id number] Date of Birth: May 17, 1929  Today's Date: 08/24/2019 PT Individual Time: 1003-1100 PT Individual Time Calculation (min): 57 min   Short Term Goals: Week 4:  PT Short Term Goal 1 (Week 4): = to LTGs based on ELOS  Skilled Therapeutic Interventions/Progress Updates:    Pt seated in w/c upon PT arrival, agreeable to therapy tx and denies pain. Pt ambulated from room>gym x 150 ft with RW and CGA-min assist, pt with trendelenburg gait secondary to L hip abductor weakness, pt also continues with decreased L foot clearance and step length, cues to correct this. Pt ambulated x 60 ft to the steps. Pt ascended/descended 4 steps with L rail for B UE support and mod assist, step to pattern with cues for techniques and foot placements, during descending therapist assisting with L foot placement to limit adduction/narrow BOS. Pt ambulated x 20 ft to the mat with RW and CGA. Pt worked on hip abductor strength to perform side steps in place x 10 per LE with UE support on RW and theraband around knees. Performed x 20 seated hip abduction with theraband. Pt performed x 10 sit<>stands this session without UE support working on LE strength, theraband around knees for hip abductor activation, cues for techniques and anterior weightshift, "nose over toes." Pt performed sidestepping in each directin 2 x 8 ft with theraband around knees and UE support holding therapist's elbows, min assist working on hip strength and dynamic balance. Ambulated back to room x 200 ft with RW CGA-min assist, and left with needs in reach and chair alarm set, in w/c.   Therapy Documentation Precautions:  Precautions Precautions: Fall Precaution Comments: L hemiparesis Restrictions Weight Bearing Restrictions: No    Therapy/Group: Individual Therapy  Netta Corrigan, PT, DPT, CSRS 08/24/2019, 7:48 AM

## 2019-08-24 NOTE — Progress Notes (Signed)
[facility] PHYSICAL MEDICINE & REHABILITATION PROGRESS NOTE  Subjective/Complaints:  No issues overnite breathing is ok   ROS: Denies CP, SOB, N/V/D  Objective: Vital Signs: Blood pressure 130/75, pulse 70, temperature 97.7 F (36.5 C), temperature source Oral, resp. rate 19, height 5' (1.524 m), weight 63.9 kg, SpO2 94 %. No results found. Recent Labs    08/22/19 0519  WBC 24.7*  HGB 11.9*  HCT 38.0  PLT 116*   Recent Labs    08/22/19 0519  NA 138  K 3.8  CL 103  CO2 28  GLUCOSE 81  BUN 19  CREATININE 0.48  CALCIUM 8.7*    Physical Exam: BP 130/75 (BP Location: Right Arm)   Pulse 70   Temp 97.7 F (36.5 C) (Oral)   Resp 19   Ht 5' (1.524 m)   Wt 63.9 kg   SpO2 94%   BMI 27.51 kg/m  Constitutional: No distress . Vital signs reviewed. HENT: Normocephalic.  Atraumatic. Eyes: EOMI. No discharge. Cardiovascular: No JVD. Respiratory: Normal effort.  No stridor. GI: Non-distended. Skin: Warm and dry.  Intact. Psych: Normal mood.  Normal behavior. Musc: Bilateral hallux valgus deformities Left upper extremity edema improving Neurological: Alert Follows simple commands.   HOH Motor:  Left upper extremity: Shoulder abduction, elbow flexion/extension 4-/5, handgrip 3+-4 -/5 with apraxia, unchanged Left lower extremity: 4-/5 hip flexion, knee extension, 3+/5 ankle dorsiflexion, stable  Assessment/Plan: 1. Functional deficits secondary to right pontine infarct which require 3+ hours per day of interdisciplinary therapy in a comprehensive inpatient rehab setting.  Physiatrist is providing close team supervision and 24 hour management of active medical problems listed below.  Physiatrist and rehab team continue to assess barriers to discharge/monitor patient progress toward functional and medical goals  Care Tool:  Bathing    Body parts bathed by patient: Left arm, Chest, Abdomen, Right upper leg, Left upper leg, Face, Front perineal area, Right lower leg,  Left lower leg, Right arm, Buttocks   Body parts bathed by helper: Buttocks     Bathing assist Assist Level: Contact Guard/Touching assist     Upper Body Dressing/Undressing Upper body dressing   What is the patient wearing?: Pull over shirt    Upper body assist Assist Level: Minimal Assistance - Patient > 75%    Lower Body Dressing/Undressing Lower body dressing      What is the patient wearing?: Pants, Underwear/pull up     Lower body assist Assist for lower body dressing: Moderate Assistance - Patient 50 - 74%     Toileting Toileting    Toileting assist Assist for toileting: Minimal Assistance - Patient > 75%     Transfers Chair/bed transfer  Transfers assist     Chair/bed transfer assist level: Minimal Assistance - Patient > 75% Chair/bed transfer assistive device: Museum/gallery exhibitions officer assist      Assist level: Contact Guard/Touching assist Assistive device: Walker-rolling Max distance: 167ft   Walk 10 feet activity   Assist     Assist level: Contact Guard/Touching assist Assistive device: Walker-rolling   Walk 50 feet activity   Assist Walk 50 feet with 2 turns activity did not occur: Safety/medical concerns  Assist level: Contact Guard/Touching assist Assistive device: Walker-rolling    Walk 150 feet activity   Assist Walk 150 feet activity did not occur: Safety/medical concerns  Assist level: Contact Guard/Touching assist Assistive device: Walker-rolling    Walk 10 feet on uneven surface  activity   Assist Walk 10  feet on uneven surfaces activity did not occur: Safety/medical concerns         Wheelchair     Assist Will patient use wheelchair at discharge?: No(tbd)             Wheelchair 50 feet with 2 turns activity    Assist            Wheelchair 150 feet activity     Assist            Medical Problem List and Plan: 1.  Left-sided weakness secondary to small  right pontine infarction secondary small vessel disease on 07/30/2019.  Continue CIR PT, OT  2.  Antithrombotics: -DVT/anticoagulation: Lovenox  Left upper extremity Doppler reviewed, negative for DVT             -antiplatelet therapy: Aspirin 81 mg daily and Plavix 75 mg daily x3 weeks then aspirin alone 3. Pain Management: Tylenol as needed for right shoulder pain  Baclofen nightly started on 11/15 for spasticity, increased to 20 on 11/17, increased to 30 on 11/20 with improvement 4. Mood: Xanax 0.25 mg bid scheduled, in addition to q12 prn doses  Lexapro 5 daily started on 11/23, appears improved  Team to provide emotional support             -antipsychotic agents: N/A 5. Neuropsych: This patient is capable of making decisions on her own behalf. 6. Skin/Wound Care: Routine skin checks 7. Fluids/Electrolytes/Nutrition: Routine in and outs.    BMP within acceptable range on 12/4 8.  History of CLL.  Followed outpatient by Dr. Marko Plume.    Afebrile  WBCs 24.7 on 12/4  Recheck Wednesday 9.  Hypertension.  Patient on Lotrel 5-20 mg daily as needed prior to admission.   Vitals:   08/23/19 1950 08/24/19 0526  BP: 122/66 130/75  Pulse: 69 70  Resp: 19 19  Temp: 97.7 F (36.5 C) 97.7 F (36.5 C)  SpO2: 96% 94%  controlled 12/6             Monitor with increased mobility 10.  Thrombocytopenia             Platelets 116 on 12/4  Recheck Wednesday 11.  Sleep disturbance:   See #3,4  Chamomile tea ordered as well  Improving overall 12. Hypoalbuminemia   Supplement initiated on 11/17 13. Slow transit constipation  Improving 14.  Acute blood loss anemia  Hemoglobin 11.9 on 12/4 15.  Macular degeneration  Home PreserVision tabs ordered 16.  Intermittent wheezing- inaudible today except with auscultation   Per discussion with therapies, intermittent and relieved with activity  Afebrile   Sputum culture ordered, pending  Chest x-ray personally reviewed, unremarkable for  infection  Flutter valve ordered, continue  Albuterol as needed ordered   Improving  LOS: 23 days A FACE TO FACE EVALUATION WAS PERFORMED  Charlett Blake 08/24/2019, 9:53 AM

## 2019-08-24 NOTE — Progress Notes (Signed)
Physical Therapy Session Note  Patient Details  Name: Abigail Moreno MRN: [id number] Date of Birth: 23-Jul-1929  Today's Date: 08/24/2019 PT Individual Time: [id number] PT Individual Time Calculation (min): 38 min   Short Term Goals: Week 4:  PT Short Term Goal 1 (Week 4): = to LTGs based on ELOS  Skilled Therapeutic Interventions/Progress Updates:   Pt in w/c and agreeable to therapy, denies pain. Pt choosing to ambulate to therapy gym this session. Pt ambulated 125' and 50' to therapy gym w/ CGA-min assist using RW. Min manual assist for R lateral weight shifting during L swing phase. Practiced stair negotiation w/ L rail x4 steps, performed going sideways w/ min-mod assist to ascend and min assist to descend. 1-2 verbal/visual cues for technique and step placement, otherwise pt performing remainder w/ good technique. Ambulated back to room, 100' and 32' w/ CGA using RW. Continued verbal cues for increased L foot clearance and tactile cues for R lateral weight shifting during swing. Pt performing all sit<>stands w/ supervision-CGA and managing LUE on RW w/o assist from therapist. Seated rest breaks throughout session 2/2 fatigue and increased work of breathing. Returned to room and ended session in w/c, all needs in reach.   Therapy Documentation Precautions:  Precautions Precautions: Fall Precaution Comments: L hemiparesis Restrictions Weight Bearing Restrictions: No Vital Signs: Therapy Vitals Temp: (!) 97.4 F (36.3 C) Temp Source: Oral Pulse Rate: 72 Resp: 19 BP: 111/74 Patient Position (if appropriate): Sitting Oxygen Therapy SpO2: 94 % O2 Device: Room Air  Therapy/Group: Individual Therapy  Londan Coplen Clent Demark 08/24/2019, 3:29 PM

## 2019-08-24 NOTE — Plan of Care (Signed)
  Problem: Consults Goal: RH STROKE PATIENT EDUCATION Description: See Patient Education module for education specifics  Outcome: Progressing Goal: Nutrition Consult-if indicated Outcome: Progressing   Problem: RH BOWEL ELIMINATION Goal: RH STG MANAGE BOWEL WITH ASSISTANCE Description: STG Manage Bowel with min Assistance. Outcome: Progressing Goal: RH STG MANAGE BOWEL W/MEDICATION W/ASSISTANCE Description: STG Manage Bowel with Medication with min Assistance. Outcome: Progressing   Problem: RH BLADDER ELIMINATION Goal: RH STG MANAGE BLADDER WITH ASSISTANCE Description: STG Manage Bladder With min Assistance Outcome: Progressing   Problem: RH SKIN INTEGRITY Goal: RH STG MAINTAIN SKIN INTEGRITY WITH ASSISTANCE Description: STG Maintain Skin Integrity With min Assistance. Outcome: Progressing Goal: RH STG ABLE TO PERFORM INCISION/WOUND CARE W/ASSISTANCE Description: STG Able To Perform Incision/Wound Care With min Assistance. Outcome: Progressing   Problem: RH SAFETY Goal: RH STG ADHERE TO SAFETY PRECAUTIONS W/ASSISTANCE/DEVICE Description: STG Adhere to Safety Precautions With Cues and reminder Assistance/Device. Outcome: Progressing   Problem: RH COGNITION-NURSING Goal: RH STG USES MEMORY AIDS/STRATEGIES W/ASSIST TO PROBLEM SOLVE Description: STG Uses Memory Aids/Strategies With cues and reminders Assistance to Problem Solve. Outcome: Progressing Goal: RH STG ANTICIPATES NEEDS/CALLS FOR ASSIST W/ASSIST/CUES Description: STG Anticipates Needs/Calls for Assist With cues and reminders Assistance/Cues. Outcome: Progressing   Problem: RH PAIN MANAGEMENT Goal: RH STG PAIN MANAGED AT OR BELOW PT'S PAIN GOAL Description: Pain less than 2. Outcome: Progressing   Problem: RH KNOWLEDGE DEFICIT Goal: RH STG INCREASE KNOWLEDGE OF STROKE PROPHYLAXIS Description: Pt will be able to demonstrate understanding of adherence to medications to prevent stroke, and follow the DASH diet with  mod I assist using handouts. Outcome: Progressing

## 2019-08-25 ENCOUNTER — Inpatient Hospital Stay (HOSPITAL_COMMUNITY): Payer: PPO | Admitting: Occupational Therapy

## 2019-08-25 ENCOUNTER — Inpatient Hospital Stay (HOSPITAL_COMMUNITY): Payer: PPO

## 2019-08-25 NOTE — Plan of Care (Signed)
  Problem: Consults Goal: RH STROKE PATIENT EDUCATION Description: See Patient Education module for education specifics  Outcome: Progressing Goal: Nutrition Consult-if indicated Outcome: Progressing   Problem: RH BOWEL ELIMINATION Goal: RH STG MANAGE BOWEL WITH ASSISTANCE Description: STG Manage Bowel with min Assistance. Outcome: Progressing Goal: RH STG MANAGE BOWEL W/MEDICATION W/ASSISTANCE Description: STG Manage Bowel with Medication with min Assistance. Outcome: Progressing   Problem: RH BLADDER ELIMINATION Goal: RH STG MANAGE BLADDER WITH ASSISTANCE Description: STG Manage Bladder With min Assistance Outcome: Progressing   Problem: RH SKIN INTEGRITY Goal: RH STG MAINTAIN SKIN INTEGRITY WITH ASSISTANCE Description: STG Maintain Skin Integrity With min Assistance. Outcome: Progressing Goal: RH STG ABLE TO PERFORM INCISION/WOUND CARE W/ASSISTANCE Description: STG Able To Perform Incision/Wound Care With min Assistance. Outcome: Progressing   Problem: RH SAFETY Goal: RH STG ADHERE TO SAFETY PRECAUTIONS W/ASSISTANCE/DEVICE Description: STG Adhere to Safety Precautions With Cues and reminder Assistance/Device. Outcome: Progressing   Problem: RH COGNITION-NURSING Goal: RH STG USES MEMORY AIDS/STRATEGIES W/ASSIST TO PROBLEM SOLVE Description: STG Uses Memory Aids/Strategies With cues and reminders Assistance to Problem Solve. Outcome: Progressing Goal: RH STG ANTICIPATES NEEDS/CALLS FOR ASSIST W/ASSIST/CUES Description: STG Anticipates Needs/Calls for Assist With cues and reminders Assistance/Cues. Outcome: Progressing   Problem: RH PAIN MANAGEMENT Goal: RH STG PAIN MANAGED AT OR BELOW PT'S PAIN GOAL Description: Pain less than 2. Outcome: Progressing   Problem: RH KNOWLEDGE DEFICIT Goal: RH STG INCREASE KNOWLEDGE OF STROKE PROPHYLAXIS Description: Pt will be able to demonstrate understanding of adherence to medications to prevent stroke, and follow the DASH diet with  mod I assist using handouts. Outcome: Progressing

## 2019-08-25 NOTE — Progress Notes (Signed)
Physical Therapy Session Note  Patient Details  Name: Abigail Moreno MRN: [id number] Date of Birth: 1929/01/03  Today's Date: 08/25/2019 PT Individual Time: 1500-1535 PT Individual Time Calculation (min): 35 min   Short Term Goals: Week 4:  PT Short Term Goal 1 (Week 4): = to LTGs based on ELOS  Skilled Therapeutic Interventions/Progress Updates:     Patient in recliner in room upon PT arrival. Patient alert and agreeable to PT session. Patient reported 6-7/10 R shoulder pain during session, RN made aware. PT provided repositioning, rest breaks, and distraction as pain interventions throughout session. L AFO and B tennis shoes donned prior to session.   Focused session on gait training and stair training in preparation for d/c this week.   Therapeutic Activity: Bed Mobility: Patient performed sit to supine with min A for LE management in a flat bed without use of bed rails. Provided verbal cues for scooping L LE with R to lift LEs into the bed, however, patient had difficulty with this due to fatigue from ambulation this session. Transfers: Patient performed sit to/from stand x3 with min-mod A x1 and CGA x2 using RW. Provided verbal cues for leaning far forward to stand.  Gait Training:  Patient ambulated 165 feet x2 using RW with L hand splint and L AFO with CGA-close supervision. Ambulated with decreased L foot clearance, decreased gait speed, decreased B step length L>R, and decreased hip/knee flexion in swing. Provided verbal cues for looking ahead and increased L foot clearance. She went up/down 4-6" steps using a L rails with B UE support and side stepping technique with step-to pattern leading with R while ascending and L while descending with min A. Provided manual facilitation and cues for L LE placement during descent. Patient reported increased R shoulder pain, see above, and fatigue after gait training and requested to take a nap before her next session this afternoon. Patient  missed 10 min of skilled PT due to pain/fatigue.   Patient in bed at end of session with breaks locked, bed alarm set, and all needs within reach.    Therapy Documentation Precautions:  Precautions Precautions: Fall Precaution Comments: L hemiparesis Restrictions Weight Bearing Restrictions: No General: PT Amount of Missed Time (min): 10 Minutes PT Missed Treatment Reason: Pain;Patient fatigue(R shoulder pain)    Therapy/Group: Individual Therapy  Tyson Masin L Mallory Enriques PT, DPT  08/25/2019, 4:21 PM

## 2019-08-25 NOTE — Progress Notes (Signed)
Abigail Moreno PHYSICAL MEDICINE & REHABILITATION PROGRESS NOTE  Subjective/Complaints:  Abigail Moreno continues to have mild right shoulder pain, for which she would like Tylenol which has been helping. She has some left knee stiffness which feels better than usual. She has been having some difficulty sleeping due to wheezing at night. Refused oxygen as she did not feel SOB.   ROS: Denies CP, SOB, N/V/D  Objective: Vital Signs: Blood pressure 135/72, pulse 72, temperature 98.5 F (36.9 C), resp. rate 18, height 5' (1.524 m), weight 63.9 kg, SpO2 92 %. No results found. No results for input(s): WBC, HGB, HCT, PLT in the last 72 hours. No results for input(s): NA, K, CL, CO2, GLUCOSE, BUN, CREATININE, CALCIUM in the last 72 hours.  Physical Exam: BP 135/72 (BP Location: Right Arm)   Pulse 72   Temp 98.5 F (36.9 C)   Resp 18   Ht 5' (1.524 m)   Wt 63.9 kg   SpO2 92%   BMI 27.51 kg/m  Constitutional: No distress . Vital signs reviewed. HENT: Normocephalic.  Atraumatic. Eyes: EOMI. No discharge. Cardiovascular: No JVD. Respiratory: Normal effort.  No stridor. GI: Non-distended. Skin: Warm and dry.  Intact. Psych: Normal mood.  Normal behavior. Musc: Bilateral hallux valgus deformities Left upper extremity edema improving Neurological: Alert Follows simple commands.   HOH Motor:  Left upper extremity: Shoulder abduction, elbow flexion/extension 4-/5, handgrip 4 -/5 with apraxia. Left lower extremity: 4-/5 hip flexion, knee extension, 3+/5 ankle dorsiflexion, stable  Assessment/Plan: 1. Functional deficits secondary to right pontine infarct which require 3+ hours per day of interdisciplinary therapy in a comprehensive inpatient rehab setting.  Physiatrist is providing close team supervision and 24 hour management of active medical problems listed below.  Physiatrist and rehab team continue to assess barriers to discharge/monitor patient progress toward functional and  medical goals  Care Tool:  Bathing    Body parts bathed by patient: Left arm, Chest, Abdomen, Right upper leg, Left upper leg, Face, Front perineal area, Right lower leg, Left lower leg, Right arm, Buttocks   Body parts bathed by helper: Buttocks     Bathing assist Assist Level: Contact Guard/Touching assist     Upper Body Dressing/Undressing Upper body dressing   What is the patient wearing?: Pull over shirt    Upper body assist Assist Level: Minimal Assistance - Patient > 75%    Lower Body Dressing/Undressing Lower body dressing      What is the patient wearing?: Pants, Underwear/pull up     Lower body assist Assist for lower body dressing: Moderate Assistance - Patient 50 - 74%     Toileting Toileting    Toileting assist Assist for toileting: Minimal Assistance - Patient > 75%     Transfers Chair/bed transfer  Transfers assist     Chair/bed transfer assist level: Contact Guard/Touching assist Chair/bed transfer assistive device: Armrests   Locomotion Ambulation   Ambulation assist      Assist level: Minimal Assistance - Patient > 75% Assistive device: Walker-rolling Max distance: 125'   Walk 10 feet activity   Assist     Assist level: Minimal Assistance - Patient > 75% Assistive device: Walker-rolling   Walk 50 feet activity   Assist Walk 50 feet with 2 turns activity did not occur: Safety/medical concerns  Assist level: Minimal Assistance - Patient > 75% Assistive device: Walker-rolling    Walk 150 feet activity   Assist Walk 150 feet activity did not occur: Safety/medical concerns  Assist level: Contact  Guard/Touching assist Assistive device: Walker-rolling    Walk 10 feet on uneven surface  activity   Assist Walk 10 feet on uneven surfaces activity did not occur: Safety/medical concerns         Wheelchair     Assist Will patient use wheelchair at discharge?: No(tbd)             Wheelchair 50 feet with 2  turns activity    Assist            Wheelchair 150 feet activity     Assist            Medical Problem List and Plan: 1.  Left-sided weakness secondary to small right pontine infarction secondary small vessel disease on 07/30/2019.  Continue CIR PT, OT  2.  Antithrombotics: -DVT/anticoagulation: Lovenox  Left upper extremity Doppler reviewed, negative for DVT             -antiplatelet therapy: Aspirin 81 mg daily and Plavix 75 mg daily x3 weeks then aspirin alone 3. Pain Management: Tylenol as needed for right shoulder pain  Baclofen nightly started on 11/15 for spasticity, increased to 20 on 11/17, increased to 30 on 11/20 with improvement 4. Mood: Xanax 0.25 mg bid scheduled, in addition to q12 prn doses  Lexapro 5 daily started on 11/23, appears improved  Team to provide emotional support             -antipsychotic agents: N/A 5. Neuropsych: This patient is capable of making decisions on her own behalf. 6. Skin/Wound Care: Routine skin checks 7. Fluids/Electrolytes/Nutrition: Routine in and outs.    BMP within acceptable range on 12/4 8.  History of CLL.  Followed outpatient by Dr. Marko Plume.    Afebrile  WBCs 24.7 on 12/4  Recheck Wednesday 9.  Hypertension.  Patient on Lotrel 5-20 mg daily as needed prior to admission.   Vitals:   08/24/19 1942 08/25/19 0530  BP: 137/76 135/72  Pulse: 74 72  Resp: 19 18  Temp: 97.6 F (36.4 C) 98.5 F (36.9 C)  SpO2: 93% 92%  controlled 12/6, 12/7             Monitor with increased mobility 10.  Thrombocytopenia             Platelets 116 on 12/4  Recheck Wednesday 11.  Sleep disturbance:   See #3,4  Chamomile tea ordered as well  Improving overall 12. Hypoalbuminemia   Supplement initiated on 11/17 13. Slow transit constipation  Improving 14.  Acute blood loss anemia  Hemoglobin 11.9 on 12/4 15.  Macular degeneration  Home PreserVision tabs ordered 16.  Intermittent wheezing- inaudible today except with  auscultation   Per discussion with therapies, intermittent and relieved with activity  Afebrile   Sputum culture ordered, pending  Chest x-ray personally reviewed, unremarkable for infection  Flutter valve ordered, continue  Albuterol as needed ordered   Improving  Advised that Abigail Moreno use IS 10x per hour to prevent atelectasis.  LOS: 24 days A FACE TO FACE EVALUATION WAS PERFORMED  Devon Pretty P Aakash Hollomon 08/25/2019, 10:10 AM

## 2019-08-25 NOTE — Progress Notes (Signed)
Occupational Therapy Session Note  Patient Details  Name: Abigail Moreno MRN: [id number] Date of Birth: 1929-04-23  Today's Date: 08/25/2019 OT Individual Time: 3845-3646 OT Individual Time Calculation (min): 75 min    Short Term Goals: Week 4:  OT Short Term Goal 1 (Week 4): STGs = LTGs  Skilled Therapeutic Interventions/Progress Updates:    Pt received in w/c dressed and ready for the day. She did want to brush her teeth.  Now that her L hand is more functional, she was able to open and place toothpaste on brush using L hand as an active A without A.  Applied lotion to legs and donned TEDs and AFO for pt,she assisted by pushing her feet down into shoes.   Pt taken to gym and used RW to transfer to mat.  Therapy session focused on increasing LUE AROM.  LUE NMR exercises: - B hands on light dowel bar pushing forward and back above thighs, increased range to 60 degrees with support A under elbow - B hands on bar with elbow curls with A to support L elbow -single arm on dowel bar at a vertical angle rotating bar inward 10x, outward 10x with small rotations that pt was able to do herself, 3 sets, switched to R arm for RTC ROM -grasp strengthening with blue foam block, upgraded from yellow block light resistance -tip pinch picking up dominoes placed on their sides.  Initially tried to pick up flat marbles and kidney beans but they were too small to grasp.  Tip pinch strengthening with foam block.    Placed saebo stim go on L deltoid for cyclic unattended estim for 60 min.    Saebo Stim Go 330 pulse width 35 Hz pulse rate On 8 sec/ off 8 sec Ramp up/ down 2 sec Symmetrical Biphasic wave form  Max intensity 177m at 500 Ohm load   Pt ambulated back to room with RW with min A.  Set up in recliner with chair alarm set and all needs met.  Returned at 11am to [employer] shoulder. Pt tolerated well.   Therapy Documentation Precautions:  Precautions Precautions:  Fall Precaution Comments: L hemiparesis Restrictions Weight Bearing Restrictions: No    Vital Signs: Therapy Vitals Temp: 98.5 F (36.9 C) Pulse Rate: 72 Resp: 18 BP: 135/72 Patient Position (if appropriate): Lying Oxygen Therapy SpO2: 92 % O2 Device: Room Air Pain: Pain Assessment Pain Score: 0-No pain ADL: ADL Grooming: Setup Where Assessed-Grooming: Sitting at sink Upper Body Bathing: Supervision/safety Where Assessed-Upper Body Bathing: Shower Lower Body Bathing: Contact guard Where Assessed-Lower Body Bathing: Shower Upper Body Dressing: Minimal assistance Where Assessed-Upper Body Dressing: Edge of bed Lower Body Dressing: Moderate assistance Where Assessed-Lower Body Dressing: Wheelchair Toileting: Minimal assistance Where Assessed-Toileting: TGlass blower/designer MPsychiatric nurseMethod: ACounselling psychologist GEnergy manager MEnvironmental education officerMethod: AHeritage manager Grab bars, Transfer tub bench  Therapy/Group: Individual Therapy  SAlcorn12/03/2019, 9:17 AM

## 2019-08-25 NOTE — Progress Notes (Signed)
Physical Therapy Session Note  Patient Details  Name: Abigail Moreno MRN: [id number] Date of Birth: 1929-08-16  Today's Date: 08/25/2019 PT Individual Time: 1631-1700 PT Individual Time Calculation (min): 29 min   Short Term Goals: Week 4:  PT Short Term Goal 1 (Week 4): = to LTGs based on ELOS  Skilled Therapeutic Interventions/Progress Updates:    Pt supine in bed upon PT arrival, agreeable to therapy tx and denies pain. Pt transferred to sitting with supervision. Seated EOB therapist donned shoes and L AFO for time management. Pt performed sit>Stand with RW and CGA, pt ambulated from room<>Dayroom this session 2 x 100 ft with min assist and cues for increased L step length/foot clearance. Pt worked on LE strength to perform x 10 sit<>Stands from the mat without AD, CGA. Pt worked on L foot clearance and NMR to perform toe taps on aerobic step x 10 and to perform side steps in place x 10, UE support on RW with min assist and facilitation for R lateral weightshift. Pt left seated in w/c at end of session with needs in reach and chair alarm set.    Therapy Documentation Precautions:  Precautions Precautions: Fall Precaution Comments: L hemiparesis Restrictions Weight Bearing Restrictions: No    Therapy/Group: Individual Therapy  Netta Corrigan, PT, DPT, CSRS 08/25/2019, 7:58 AM

## 2019-08-25 NOTE — Progress Notes (Signed)
Social Work Discharge Note   The overall goal for the admission was met for:   Discharge location: Yes-HOME TO DAUGHTER'S WHERE DAUGHTER CAN PROVIDE 24 HR SUPERVISION  Length of Stay: Yes-26 DAYS  Discharge activity level: Yes-SUPERVISION-CGA LEVEL  Home/community participation: Yes  Services provided included: MD, RD, PT, OT, SLP, RN, CM, Pharmacy, Neuropsych and SW  Financial Services: Private Insurance: [employer]  Follow-up services arranged: Home Health: [facility]-PT & OT, DME: [facility], [facility] and Patient/Family has no preference for HH/DME agencies  Comments (or additional information):DAUGHTER AND SON CAME IN FOR EDUCATION AND IT WENT WELL. GOING TO DAUGHTER'S HOME WHERE SHE CAN PROVIDE 24 HR SUPERVISION-CURRENTLY WORKING FROM HOME.  Patient/Family verbalized understanding of follow-up arrangements: Yes  Individual responsible for coordination of the follow-up plan: MALINDA-DAUGHTER  Confirmed correct DME delivered: Elease Hashimoto 08/25/2019    Elease Hashimoto

## 2019-08-25 NOTE — Progress Notes (Signed)
Occupational Therapy Session Note  Patient Details  Name: Abigail Moreno MRN: [id number] Date of Birth: 02-May-1929  Today's Date: 08/25/2019 OT Individual Time: [id number] OT Individual Time Calculation (min): 55 min    Short Term Goals: Week 4:  OT Short Term Goal 1 (Week 4): STGs = LTGs  Skilled Therapeutic Interventions/Progress Updates:    [name] began session with functional mobility from the bedside recliner to the therapy gym with min guard assist using the RW with hand splint attachment on the left side.  Once in the gym, began with weightbearing in standing and using the LUE to wash the high/low table with the LUE to targets given.  Min facilitation needed to extend her left elbow completely.  She then progressed to the Box and Blocks test where she completed 10 blocks with the LUE and 32 with the right.  She continued to work on picking up and placing blocks with the LUE for exercise after the time trial.  Finished FM work by picking up large round pegs from the container and placing them in the peg board.  Increased time and min assist needed for manipulation of the pegs at time to place five of them.  She returned to the room with min guard assist and increased time using the RW for support.  She transferred to the recliner to rest and wait for lunch.  Call button and phone in reach with chair alarm in place.     Therapy Documentation Precautions:  Precautions Precautions: Fall Precaution Comments: L hemiparesis Restrictions Weight Bearing Restrictions: No   Pain: Pain Assessment Pain Scale: Faces Pain Score: 0-No pain Faces Pain Scale: Hurts a little bit Pain Location: Shoulder Pain Orientation: Right Pain Descriptors / Indicators: Discomfort Pain Onset: With Activity Pain Intervention(s): Repositioned;Emotional support ADL: See Care Tool Section for details of mobility  Therapy/Group: Individual Therapy  Darya Bigler OTR/L 08/25/2019, 12:07 PM

## 2019-08-25 NOTE — Discharge Summary (Signed)
Physician Discharge Summary  Patient ID: Abigail Moreno MRN: [id number] DOB/AGE: 83-Jul-1930 83 y.o.  Admit date: 08/01/2019 Discharge date: 08/27/2019  Discharge Diagnoses:  Principal Problem:   Right pontine CVA [facility]) Active Problems:   Acute CVA (cerebrovascular accident) ([place])   Personal history of CLL (chronic lymphocytic leukemia)   Labile blood pressure   Muscle spasm of left lower extremity   Sleep disturbance   Slow transit constipation   Benign essential HTN   Acute blood loss anemia   Reactive depression   Cough productive of purulent sputum   Peripheral edema Thrombocytopenia  Discharged Condition: Stable  Significant Diagnostic Studies: Ct Angio Head W Or Wo Contrast  Result Date: 07/30/2019 CLINICAL DATA:  Dizziness and syncope. Head trauma. EXAM: CT ANGIOGRAPHY HEAD AND NECK TECHNIQUE: Multidetector CT imaging of the head and neck was performed using the standard protocol during bolus administration of intravenous contrast. Multiplanar CT image reconstructions and MIPs were obtained to evaluate the vascular anatomy. Carotid stenosis measurements (when applicable) are obtained utilizing NASCET criteria, using the distal internal carotid diameter as the denominator. CONTRAST:  5mL OMNIPAQUE IOHEXOL 350 MG/ML SOLN COMPARISON:  None. FINDINGS: CTA NECK FINDINGS SKELETON: There is no bony spinal canal stenosis. No lytic or blastic lesion. OTHER NECK: Normal pharynx, larynx and major salivary glands. No cervical lymphadenopathy. Unremarkable thyroid gland. UPPER CHEST: No pneumothorax or pleural effusion. No nodules or masses. AORTIC ARCH: There is mild calcific atherosclerosis of the aortic arch. There is no aneurysm, dissection or hemodynamically significant stenosis of the visualized portion of the aorta. Conventional 3 vessel aortic branching pattern. The visualized proximal subclavian arteries are widely patent. RIGHT CAROTID SYSTEM: No dissection, occlusion or  aneurysm. There is mixed density atherosclerosis extending into the proximal ICA, resulting in less than 50% stenosis. LEFT CAROTID SYSTEM: Normal without aneurysm, dissection or stenosis. VERTEBRAL ARTERIES: Right dominant configuration. Both origins are clearly patent. There is no dissection, occlusion or flow-limiting stenosis to the skull base (V1-V3 segments). CTA HEAD FINDINGS POSTERIOR CIRCULATION: --Vertebral arteries: Normal V4 segments. --Posterior inferior cerebellar arteries (PICA): Patent origins from the vertebral arteries. --Anterior inferior cerebellar arteries (AICA): Patent origins from the basilar artery. --Basilar artery: Normal. --Superior cerebellar arteries: Normal. --Posterior cerebral arteries: Normal. Both originate from the basilar artery. Posterior communicating arteries (p-comm) are diminutive or absent. ANTERIOR CIRCULATION: --Intracranial internal carotid arteries: Normal. --Anterior cerebral arteries (ACA): Normal. Both A1 segments are present. Patent anterior communicating artery (a-comm). --Middle cerebral arteries (MCA): Normal. VENOUS SINUSES: As permitted by contrast timing, patent. ANATOMIC VARIANTS: None Review of the MIP images confirms the above findings. IMPRESSION: 1. No emergent large vessel occlusion or high-grade stenosis of the intracranial arteries. 2. No dissection, aneurysm or hemodynamically significant stenosis of the carotid or vertebral arteries. 3. Aortic Atherosclerosis (ICD10-I70.0). Electronically Signed   By: Ulyses Jarred M.D.   On: 07/30/2019 22:16   Dg Chest 2 View  Result Date: 08/21/2019 CLINICAL DATA:  Productive cough. EXAM: CHEST - 2 VIEW COMPARISON:  07/30/2019 FINDINGS: Lungs are adequately inflated without focal airspace consolidation or effusion. Mild stable cardiomegaly. Remainder of the exam is unchanged. IMPRESSION: Stable exam. No active cardiopulmonary disease. Electronically Signed   By: Marin Olp M.D.   On: 08/21/2019 15:15   Dg  Chest 2 View  Result Date: 07/30/2019 CLINICAL DATA:  Left-sided chest pain. EXAM: CHEST - 2 VIEW COMPARISON:  02/28/2017 FINDINGS: The heart size and mediastinal contours are within normal limits. Both lungs are clear. The visualized skeletal structures are unremarkable.  Aortic atherosclerosis. IMPRESSION: 1. No active cardiopulmonary disease. 2. Aortic atherosclerosis. Electronically Signed   By: Lorriane Shire M.D.   On: 07/30/2019 18:50   Ct Head Wo Contrast  Result Date: 07/30/2019 CLINICAL DATA:  Status post fall with a blow to the head. Initial encounter. EXAM: CT HEAD WITHOUT CONTRAST CT MAXILLOFACIAL WITHOUT CONTRAST TECHNIQUE: Multidetector CT imaging of the head and maxillofacial structures were performed using the standard protocol without intravenous contrast. Multiplanar CT image reconstructions of the maxillofacial structures were also generated. COMPARISON:  None. FINDINGS: CT HEAD FINDINGS Brain: No evidence of acute infarction, hemorrhage, hydrocephalus, extra-axial collection or mass lesion/mass effect. Chronic microvascular ischemic change noted. Vascular: No hyperdense vessel or unexpected calcification. Skull: Intact.  No focal lesion. Other: Soft tissue contusion on the forehead is seen. CT MAXILLOFACIAL FINDINGS Osseous: No fracture or mandibular dislocation. No destructive process. Orbits: Negative. No traumatic or inflammatory finding. Status post cataract surgery. Sinuses: Tiny mucous retention cyst or polyp right maxillary sinus is noted. Trace bilateral mastoid effusions. Soft tissues: Negative. IMPRESSION: Soft tissue contusion on the forehead without underlying fracture. No other acute abnormality head or face. Chronic microvascular ischemic change. Electronically Signed   By: Inge Rise M.D.   On: 07/30/2019 11:18   Ct Angio Neck W Or Wo Contrast  Result Date: 07/30/2019 CLINICAL DATA:  Dizziness and syncope. Head trauma. EXAM: CT ANGIOGRAPHY HEAD AND NECK  TECHNIQUE: Multidetector CT imaging of the head and neck was performed using the standard protocol during bolus administration of intravenous contrast. Multiplanar CT image reconstructions and MIPs were obtained to evaluate the vascular anatomy. Carotid stenosis measurements (when applicable) are obtained utilizing NASCET criteria, using the distal internal carotid diameter as the denominator. CONTRAST:  52mL OMNIPAQUE IOHEXOL 350 MG/ML SOLN COMPARISON:  None. FINDINGS: CTA NECK FINDINGS SKELETON: There is no bony spinal canal stenosis. No lytic or blastic lesion. OTHER NECK: Normal pharynx, larynx and major salivary glands. No cervical lymphadenopathy. Unremarkable thyroid gland. UPPER CHEST: No pneumothorax or pleural effusion. No nodules or masses. AORTIC ARCH: There is mild calcific atherosclerosis of the aortic arch. There is no aneurysm, dissection or hemodynamically significant stenosis of the visualized portion of the aorta. Conventional 3 vessel aortic branching pattern. The visualized proximal subclavian arteries are widely patent. RIGHT CAROTID SYSTEM: No dissection, occlusion or aneurysm. There is mixed density atherosclerosis extending into the proximal ICA, resulting in less than 50% stenosis. LEFT CAROTID SYSTEM: Normal without aneurysm, dissection or stenosis. VERTEBRAL ARTERIES: Right dominant configuration. Both origins are clearly patent. There is no dissection, occlusion or flow-limiting stenosis to the skull base (V1-V3 segments). CTA HEAD FINDINGS POSTERIOR CIRCULATION: --Vertebral arteries: Normal V4 segments. --Posterior inferior cerebellar arteries (PICA): Patent origins from the vertebral arteries. --Anterior inferior cerebellar arteries (AICA): Patent origins from the basilar artery. --Basilar artery: Normal. --Superior cerebellar arteries: Normal. --Posterior cerebral arteries: Normal. Both originate from the basilar artery. Posterior communicating arteries (p-comm) are diminutive or  absent. ANTERIOR CIRCULATION: --Intracranial internal carotid arteries: Normal. --Anterior cerebral arteries (ACA): Normal. Both A1 segments are present. Patent anterior communicating artery (a-comm). --Middle cerebral arteries (MCA): Normal. VENOUS SINUSES: As permitted by contrast timing, patent. ANATOMIC VARIANTS: None Review of the MIP images confirms the above findings. IMPRESSION: 1. No emergent large vessel occlusion or high-grade stenosis of the intracranial arteries. 2. No dissection, aneurysm or hemodynamically significant stenosis of the carotid or vertebral arteries. 3. Aortic Atherosclerosis (ICD10-I70.0). Electronically Signed   By: Ulyses Jarred M.D.   On: 07/30/2019 22:16   Mr  Angio Head Wo Contrast  Result Date: 07/31/2019 CLINICAL DATA:  Leukemia. Left-sided weakness. EXAM: MRI HEAD WITHOUT CONTRAST MRA HEAD WITHOUT CONTRAST TECHNIQUE: Multiplanar, multiecho pulse sequences of the brain and surrounding structures were obtained without intravenous contrast. Angiographic images of the head were obtained using MRA technique without contrast. COMPARISON:  None. FINDINGS: MRI HEAD FINDINGS BRAIN: Small acute infarct of the superior right pons. Early confluent hyperintense T2-weighted signal of the periventricular and deep white matter, most commonly due to chronic ischemic microangiopathy. The cerebral and cerebellar volume are age-appropriate. There is no hydrocephalus. The midline structures are normal. VASCULAR: The major intracranial arterial and venous sinus flow voids are normal. Susceptibility-sensitive sequences show no chronic microhemorrhage or superficial siderosis. SKULL AND UPPER CERVICAL SPINE: Calvarial bone marrow signal is normal. There is no skull base mass. The visualized upper cervical spine and soft tissues are normal. SINUSES/ORBITS: There are no fluid levels or advanced mucosal thickening. The mastoid air cells and middle ear cavities are free of fluid. The orbits are normal.  MRA HEAD FINDINGS POSTERIOR CIRCULATION: --Vertebral arteries: Normal V4 segments. --Posterior inferior cerebellar arteries (PICA): Patent origins from the vertebral arteries. --Anterior inferior cerebellar arteries (AICA): Patent origins from the basilar artery. --Basilar artery: Normal. --Superior cerebellar arteries: Normal. --Posterior cerebral arteries: Normal. Both originate from the basilar artery. Posterior communicating arteries (p-comm) are diminutive or absent. ANTERIOR CIRCULATION: --Intracranial internal carotid arteries: Normal. --Anterior cerebral arteries (ACA): Normal. Both A1 segments are present. Patent anterior communicating artery (a-comm). --Middle cerebral arteries (MCA): Normal. IMPRESSION: 1. 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 No hemorrhage or mass effect. 2. Normal intracranial MRA. 3. Chronic ischemic microangiopathy. Electronically Signed   By: Ulyses Jarred M.D.   On: 07/31/2019 00:29   Mr Brain Wo Contrast (neuro Protocol)  Result Date: 07/31/2019 CLINICAL DATA:  Leukemia. Left-sided weakness. EXAM: MRI HEAD WITHOUT CONTRAST MRA HEAD WITHOUT CONTRAST TECHNIQUE: Multiplanar, multiecho pulse sequences of the brain and surrounding structures were obtained without intravenous contrast. Angiographic images of the head were obtained using MRA technique without contrast. COMPARISON:  None. FINDINGS: MRI HEAD FINDINGS BRAIN: Small acute infarct of the superior right pons. Early confluent hyperintense T2-weighted signal of the periventricular and deep white matter, most commonly due to chronic ischemic microangiopathy. The cerebral and cerebellar volume are age-appropriate. There is no hydrocephalus. The midline structures are normal. VASCULAR: The major intracranial arterial and venous sinus flow voids are normal. Susceptibility-sensitive sequences show no chronic microhemorrhage or superficial siderosis. SKULL AND UPPER CERVICAL SPINE: Calvarial bone marrow signal is normal.  There is no skull base mass. The visualized upper cervical spine and soft tissues are normal. SINUSES/ORBITS: There are no fluid levels or advanced mucosal thickening. The mastoid air cells and middle ear cavities are free of fluid. The orbits are normal. MRA HEAD FINDINGS POSTERIOR CIRCULATION: --Vertebral arteries: Normal V4 segments. --Posterior inferior cerebellar arteries (PICA): Patent origins from the vertebral arteries. --Anterior inferior cerebellar arteries (AICA): Patent origins from the basilar artery. --Basilar artery: Normal. --Superior cerebellar arteries: Normal. --Posterior cerebral arteries: Normal. Both originate from the basilar artery. Posterior communicating arteries (p-comm) are diminutive or absent. ANTERIOR CIRCULATION: --Intracranial internal carotid arteries: Normal. --Anterior cerebral arteries (ACA): Normal. Both A1 segments are present. Patent anterior communicating artery (a-comm). --Middle cerebral arteries (MCA): Normal. IMPRESSION: 1. 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 No hemorrhage or mass effect. 2. Normal intracranial MRA. 3. Chronic ischemic microangiopathy. Electronically Signed   By: Ulyses Jarred M.D.   On: 07/31/2019 00:29  Vas [place] Upper Extremity Venous Duplex  Result Date: 08/21/2019 UPPER VENOUS STUDY  Indications: Edema Limitations: Restricted mobility. Comparison Study: No prior study. Performing Technologist: Maudry Mayhew MHA, RDMS, RVT, RDCS  Examination Guidelines: A complete evaluation includes B-mode imaging, spectral Doppler, color Doppler, and power Doppler as needed of all accessible portions of each vessel. Bilateral testing is considered an integral part of a complete examination. Limited examinations for reoccurring indications may be performed as noted.  Right Findings: +----------+------------+---------+-----------+----------+--------------+ RIGHT     CompressiblePhasicitySpontaneousProperties   Summary      +----------+------------+---------+-----------+----------+--------------+ Subclavian                                          Not visualized +----------+------------+---------+-----------+----------+--------------+  Left Findings: +----------+------------+---------+-----------+----------+--------------+ LEFT      CompressiblePhasicitySpontaneousProperties   Summary     +----------+------------+---------+-----------+----------+--------------+ IJV           Full                                                 +----------+------------+---------+-----------+----------+--------------+ Subclavian    Full                                                 +----------+------------+---------+-----------+----------+--------------+ Axillary      Full                                                 +----------+------------+---------+-----------+----------+--------------+ Brachial      Full                                                 +----------+------------+---------+-----------+----------+--------------+ Radial        Full                                                 +----------+------------+---------+-----------+----------+--------------+ Ulnar         Full                                                 +----------+------------+---------+-----------+----------+--------------+ Cephalic                                            Not visualized +----------+------------+---------+-----------+----------+--------------+ Basilic       Full                                                 +----------+------------+---------+-----------+----------+--------------+  Summary:  Left: No evidence of deep vein thrombosis in the upper extremity. No evidence of superficial vein thrombosis in the upper extremity. This was a limited study.  *See table(s) above for measurements and observations.  Diagnosing physician: Servando Snare MD Electronically signed by Servando Snare MD  on 08/21/2019 at 6:12:56 PM.    Final    Ct Maxillofacial Wo Contrast  Result Date: 07/30/2019 CLINICAL DATA:  Status post fall with a blow to the head. Initial encounter. EXAM: CT HEAD WITHOUT CONTRAST CT MAXILLOFACIAL WITHOUT CONTRAST TECHNIQUE: Multidetector CT imaging of the head and maxillofacial structures were performed using the standard protocol without intravenous contrast. Multiplanar CT image reconstructions of the maxillofacial structures were also generated. COMPARISON:  None. FINDINGS: CT HEAD FINDINGS Brain: No evidence of acute infarction, hemorrhage, hydrocephalus, extra-axial collection or mass lesion/mass effect. Chronic microvascular ischemic change noted. Vascular: No hyperdense vessel or unexpected calcification. Skull: Intact.  No focal lesion. Other: Soft tissue contusion on the forehead is seen. CT MAXILLOFACIAL FINDINGS Osseous: No fracture or mandibular dislocation. No destructive process. Orbits: Negative. No traumatic or inflammatory finding. Status post cataract surgery. Sinuses: Tiny mucous retention cyst or polyp right maxillary sinus is noted. Trace bilateral mastoid effusions. Soft tissues: Negative. IMPRESSION: Soft tissue contusion on the forehead without underlying fracture. No other acute abnormality head or face. Chronic microvascular ischemic change. Electronically Signed   By: Inge Rise M.D.   On: 07/30/2019 11:18    Labs:  Basic Metabolic Panel: Recent Labs  Lab 08/22/19 0519  NA 138  K 3.8  CL 103  CO2 28  GLUCOSE 81  BUN 19  CREATININE 0.48  CALCIUM 8.7*    CBC: Recent Labs  Lab 08/22/19 0519  WBC 24.7*  NEUTROABS 1.5*  HGB 11.9*  HCT 38.0  MCV 97.4  PLT 116*    CBG: No results for input(s): GLUCAP in the last 168 hours.  Family history.  Maternal grandmother with pancreatic cancer, sister with hypertension as well as hyperlipidemia, father with myocardial infarction.  Denies colon cancer or diabetes mellitus. Brief HPI:    Abigail Moreno is a 85 y.o. right-handed female with history of CLL under observation by Dr. Marko Plume, hypertension as well as hyperlipidemia.  Patient lives alone independent prior to admission daughter assist as needed.  Presented 07/30/2019 with left-sided weakness and fall after syncopal episode landing on her face.  Cranial CT scan unremarkable for acute intracranial abnormality.  There was a soft tissue contusion on the forehead without underlying fracture.  CT maxillofacial negative for fracture.  Patient did not receive TPA.  CT angiogram of head and neck with no emergent large vessel occlusion or stenosis.  MRI of the brain showed small acute infarct of the superior right pons.  Echocardiogram with ejection fraction of 70% without emboli.  Neurology consulted aspirin and Plavix x3 weeks then aspirin alone.  Subcutaneous Lovenox for DVT prophylaxis.  Tolerating a regular diet.  Patient was admitted for a comprehensive rehab program   Hospital Course: Abigail Moreno was admitted to rehab 08/01/2019 for inpatient therapies to consist of PT, ST and OT at least three hours five days a week. Past admission physiatrist, therapy team and rehab RN have worked together to provide customized collaborative inpatient rehab.  Pertaining to patient right pontine infarction secondary to small vessel disease.  Continue aspirin Plavix x3 weeks then aspirin alone follow-up neurology services.  Subcutaneous Lovenox for DVT prophylaxis venous Doppler studies of left upper extremity negative.  Patient intolerant  to statin maintained on fish oil.  Pain managed with use of baclofen nightly for spasticity titrated as needed with improvement.  Mood stabilization with scheduled Xanax patient continued on Lexapro with emotional support provided.  History of CLL followed by Dr. Marko Plume latest WBC of 24,700.  Blood pressures remain controlled patient had been on Lotrel as needed as needed prior to admission and monitored.   Patient to follow-up with PCP.   Blood pressures were monitored on TID basis and controlled  Abigail Moreno is continent of bowel and bladder.  She has made gains during rehab stay and is therapies  Abigail Moreno will continue to receive follow up therapies   after discharge  Rehab course: During patient's stay in rehab weekly team conferences were held to monitor patient's progress, set goals and discuss barriers to discharge. At admission, patient required minimal assist ambulate 6 feet rolling walker, moderate assist sit to stand, moderate assist supine to sit, min to mod assist sit to supine.  Set up upper body bathing moderate assist lower body bathing set up upper body dressing moderate assist lower body dressing  Physical exam.  Blood pressure 140/79 pulse 79 temperature 97.5 respirations 18 oxygen saturations 93% room air Constitutional well-developed well-nourished HEENT multiple bruises to the forehead and face with edema Eyes.  Pupils round and reactive to light no discharge without nystagmus Neck.  Supple nontender no tracheal deviation no thyromegaly Respiratory.  Effort normal no respiratory distress without wheeze GI.  Soft nontender positive bowel sounds without rebound Cardiac regular rate rhythm without murmur extra sounds or murmur heard Musculoskeletal.  No edema or tenderness in extremities Neurological.  Alert follows commands provides name and age somewhat confused on certain dates limited medical historian patient very hard of hearing.  Motor.  Right upper extremity right lower extremity 5 out of 5 proximal to distal.  Left upper extremity shoulder abduction elbow flexion extension 3+ out of 5 hand grip 3 out of 5 with apraxia.  Left lower extremity 4- out of 5 proximal to distal  /She  has had improvement in activity tolerance, balance, postural control as well as ability to compensate for deficits. Abigail Moreno has had improvement in functional use RUE/LUE  and RLE/LLE as well as improvement  in awareness.  Patient perform sit to supine with minimal assist for lower extremity management and a flat bed without use of bed rails.  Perform sit to stand x3 with min mod assist x1 contact-guard assist x2 using rolling walker.  Ambulates 165 feet x 2 rolling walker left hand splint left AFO contact-guard assist.  Up-and-down four 6 inch steps using left rail bilateral upper extremity support.  Patient uses left upper extremity to wash at sink side.  Needed some assist for lower body bathing dressing and manipulation of clothing.  Full family teaching completed plan discharge to home       Disposition: Discharge disposition: 01-Home or Self Care     Discharge to home   Diet: Regular  Special Instructions: No driving smoking or alcohol    Medications at discharge. 1.  Tylenol as needed 2.  Xanax 0.25 mg p.o. twice daily 3.  Aspirin 81 mg p.o. daily 4.  Baclofen 30 mg p.o. daily 5.  Lexapro 5 mg p.o. daily 6.  MiraLAX daily hold for loose stool 7.  PreserVision 2 capsules twice daily 8.  Fish oil 1000 mg p.o. twice daily 9.  Ferrous sulfate 325 mg 3 times daily    Discharge Instructions    Ambulatory  referral to Neurology   Complete by: As directed    An appointment is requested in approximately 4 weeks right pontine infarction   Ambulatory referral to Physical Medicine Rehab   Complete by: As directed    Moderate complexity follow-up 1 to 2 weeks right pontine infarction      Follow-up Information    Jamse Arn, MD Follow up.   Specialty: Physical Medicine and Rehabilitation Why: Office to call for appointment Contact information: [place] [id number] [phone]           Signed: Cathlyn Parsons 08/27/2019, 5:13 AM

## 2019-08-26 ENCOUNTER — Inpatient Hospital Stay (HOSPITAL_COMMUNITY): Payer: PPO | Admitting: Physical Therapy

## 2019-08-26 ENCOUNTER — Inpatient Hospital Stay (HOSPITAL_COMMUNITY): Payer: PPO

## 2019-08-26 ENCOUNTER — Inpatient Hospital Stay (HOSPITAL_COMMUNITY): Payer: PPO | Admitting: Occupational Therapy

## 2019-08-26 MED ORDER — ALPRAZOLAM 0.25 MG PO TABS: 0.2500 mg | ORAL_TABLET | Freq: Two times a day (BID) | ORAL | 0 refills | Status: AC | PRN

## 2019-08-26 MED ORDER — ACETAMINOPHEN 325 MG PO TABS: 650.0000 mg | ORAL_TABLET | ORAL | Status: AC | PRN

## 2019-08-26 MED ORDER — FISH OIL 1000 MG PO CAPS: 1000.0000 mg | ORAL_CAPSULE | Freq: Two times a day (BID) | ORAL | 0 refills | Status: AC

## 2019-08-26 MED ORDER — ESCITALOPRAM OXALATE 5 MG PO TABS: 5.0000 mg | ORAL_TABLET | Freq: Every day | ORAL | 1 refills | Status: AC

## 2019-08-26 MED ORDER — POLYETHYLENE GLYCOL 3350 17 G PO PACK: 17.0000 g | PACK | Freq: Every day | ORAL | 0 refills | Status: DC

## 2019-08-26 MED ORDER — BACLOFEN 10 MG PO TABS: 30.0000 mg | ORAL_TABLET | Freq: Every day | ORAL | 1 refills | Status: AC

## 2019-08-26 NOTE — Progress Notes (Signed)
Occupational Therapy Discharge Summary  Patient Details  Name: BECCI BATTY MRN: [id number] Date of Birth: 04/26/1929     Patient has met 8 of 10 long term goals due to improved activity tolerance, improved balance, postural control, ability to compensate for deficits, functional use of  LEFT upper and LEFT lower extremity and improved coordination.  Patient to discharge at [facility] Assist level.  Patient's care partner is independent to provide the necessary physical assistance at discharge.    Reasons goals not met: Pt continues to need CGA versus S with her toilet and shower transfers ambulating using a RW. On admission, she required mod-max with a squat pivot so this is a dramatic improvement.  Recommendation:  Patient will benefit from ongoing skilled OT services in home health setting to continue to advance functional skills in the area of BADL and iADL.  Equipment: transfer tub bench  Reasons for discharge: treatment goals met  Patient/family agrees with progress made and goals achieved: Yes  OT Discharge ADL ADL Eating: Independent Grooming: Independent Where Assessed-Grooming: Sitting at sink Upper Body Bathing: Supervision/safety Where Assessed-Upper Body Bathing: Shower Lower Body Bathing: Supervision/safety Where Assessed-Lower Body Bathing: Shower Upper Body Dressing: Minimal assistance Where Assessed-Upper Body Dressing: Chair Lower Body Dressing: Minimal assistance Where Assessed-Lower Body Dressing: Chair Toileting: Minimal assistance Where Assessed-Toileting: Glass blower/designer: Therapist, music Method: Counselling psychologist: Energy manager: Curator Method: Heritage manager: Grab bars, Gaffer Baseline Vision/History: Wears glasses Wears Glasses: Reading only Patient Visual Report: No change from baseline Vision Assessment?:  No apparent visual deficits Perception  Perception: Within Functional Limits Praxis Praxis: Intact Cognition Overall Cognitive Status: Within Functional Limits for tasks assessed Arousal/Alertness: Awake/alert Orientation Level: Oriented X4 Attention: Focused;Sustained Focused Attention: Appears intact Sustained Attention: Appears intact Memory: Appears intact Awareness: Appears intact Safety/Judgment: Appears intact Sensation Sensation Light Touch: Appears Intact Hot/Cold: Not tested Proprioception: Impaired by gross assessment(continues to require cuing for L LE positioning during functional mobility due to impaired proprioception) Stereognosis: Not tested Coordination Gross Motor Movements are Fluid and Coordinated: No Fine Motor Movements are Fluid and Coordinated: No Coordination and Movement Description: gross motor movements continue to be impaired due to L hemiparesis, impaired L LE motor control/coordination, and impaired balance Heel Shin Test: impaired L LE 9 Hole Peg Test: box and block test 10 on L, 32 on R Motor  Motor Motor: Hemiplegia;Abnormal postural alignment and control;Abnormal tone Motor - Discharge Observations: L hemiparesis though improved Mobility  Bed Mobility Bed Mobility: Supine to Sit;Sit to Supine  Trunk/Postural Assessment  Postural Control Protective Responses: delayed, but improved from admission Postural Limitations: flexed trunk in sitting and standing  Balance Static Sitting Balance Static Sitting - Level of Assistance: 7: Independent Dynamic Sitting Balance Dynamic Sitting - Level of Assistance: 5: Stand by assistance Static Standing Balance Static Standing - Level of Assistance: 5: Stand by assistance Dynamic Standing Balance Dynamic Standing - Level of Assistance: 4: Min assist Extremity/Trunk Assessment RUE Assessment General Strength Comments: old rotator cuff injury 0-100 range 4/5 strength LUE Assessment Active Range of  Motion (AROM) Comments: 60 degrees of shoulder flexion and abduction, 110 elbow flexion, full finger flex and ext General Strength Comments: hand 3+/5, shoulder 3-/5 Brunstrum level for arm: Stage IV Movement is deviating from synergy Brunstrum level for hand: Stage V Independence from basic synergies LUE Tone LUE Tone: Within Functional Limits   Rosine Solecki 08/26/2019, 12:26 PM

## 2019-08-26 NOTE — Progress Notes (Signed)
[facility] PHYSICAL MEDICINE & REHABILITATION PROGRESS NOTE  Subjective/Complaints:  Denies shoulder, knee, or other pain this morning. Very pleased with her progress in rehab.  Denies constipation.  ROS: Denies CP, SOB, N/V/D  Objective: Vital Signs: Blood pressure (!) 141/75, pulse 70, temperature 98.1 F (36.7 C), temperature source Oral, resp. rate 17, height 5' (1.524 m), weight 63.9 kg, SpO2 93 %. No results found. No results for input(s): WBC, HGB, HCT, PLT in the last 72 hours. No results for input(s): NA, K, CL, CO2, GLUCOSE, BUN, CREATININE, CALCIUM in the last 72 hours.  Physical Exam: BP (!) 141/75 (BP Location: Right Arm)   Pulse 70   Temp 98.1 F (36.7 C) (Oral)   Resp 17   Ht 5' (1.524 m)   Wt 63.9 kg   SpO2 93%   BMI 27.51 kg/m  Constitutional: No distress . Vital signs reviewed. HENT: Normocephalic.  Atraumatic. Eyes: EOMI. No discharge. Cardiovascular: No JVD. Respiratory: Normal effort.  No stridor. GI: Non-distended. Skin: Warm and dry.  Intact. Psych: Normal mood.  Normal behavior. Musc: Bilateral hallux valgus deformities Left upper extremity edema improving Neurological: Alert Follows simple commands.   HOH Motor:  Left upper extremity: Shoulder abduction, elbow flexion/extension 4/5, handgrip 4/5 with apraxia. Left lower extremity: 4-/5 hip flexion, knee extension, 4/5 ankle dorsiflexion, stable  Assessment/Plan: 1. Functional deficits secondary to right pontine infarct which require 3+ hours per day of interdisciplinary therapy in a comprehensive inpatient rehab setting.  Physiatrist is providing close team supervision and 24 hour management of active medical problems listed below.  Physiatrist and rehab team continue to assess barriers to discharge/monitor patient progress toward functional and medical goals  Care Tool:  Bathing    Body parts bathed by patient: Left arm, Chest, Abdomen, Right upper leg, Left upper leg, Face, Front  perineal area, Right lower leg, Left lower leg, Right arm, Buttocks   Body parts bathed by helper: Buttocks     Bathing assist Assist Level: Supervision/Verbal cueing     Upper Body Dressing/Undressing Upper body dressing   What is the patient wearing?: Pull over shirt    Upper body assist Assist Level: Minimal Assistance - Patient > 75%    Lower Body Dressing/Undressing Lower body dressing      What is the patient wearing?: Pants, Underwear/pull up     Lower body assist Assist for lower body dressing: Minimal Assistance - Patient > 75%     Toileting Toileting    Toileting assist Assist for toileting: Minimal Assistance - Patient > 75%     Transfers Chair/bed transfer  Transfers assist     Chair/bed transfer assist level: Contact Guard/Touching assist Chair/bed transfer assistive device: Programmer, multimedia   Ambulation assist      Assist level: Contact Guard/Touching assist Assistive device: Walker-rolling Max distance: 165'   Walk 10 feet activity   Assist     Assist level: Contact Guard/Touching assist Assistive device: Walker-rolling   Walk 50 feet activity   Assist Walk 50 feet with 2 turns activity did not occur: Safety/medical concerns  Assist level: Contact Guard/Touching assist Assistive device: Walker-rolling    Walk 150 feet activity   Assist Walk 150 feet activity did not occur: Safety/medical concerns  Assist level: Contact Guard/Touching assist Assistive device: Walker-rolling    Walk 10 feet on uneven surface  activity   Assist Walk 10 feet on uneven surfaces activity did not occur: Safety/medical concerns  Wheelchair     Assist Will patient use wheelchair at discharge?: No(tbd)             Wheelchair 50 feet with 2 turns activity    Assist            Wheelchair 150 feet activity     Assist            Medical Problem List and Plan: 1.  Left-sided weakness  secondary to small right pontine infarction secondary small vessel disease on 07/30/2019.  Continue CIR PT, OT  2.  Antithrombotics: -DVT/anticoagulation: Lovenox  Left upper extremity Doppler reviewed, negative for DVT             -antiplatelet therapy: Aspirin 81 mg daily and Plavix 75 mg daily x3 weeks then aspirin alone 3. Pain Management: Tylenol as needed for right shoulder pain  Baclofen nightly started on 11/15 for spasticity, increased to 20 on 11/17, increased to 30 on 11/20 with improvement 4. Mood: Xanax 0.25 mg bid scheduled, in addition to q12 prn doses  Lexapro 5 daily started on 11/23, appears improved  Team to provide emotional support             -antipsychotic agents: N/A 5. Neuropsych: This patient is capable of making decisions on her own behalf. 6. Skin/Wound Care: Routine skin checks 7. Fluids/Electrolytes/Nutrition: Routine in and outs.    BMP within acceptable range on 12/4 8.  History of CLL.  Followed outpatient by Dr. Marko Plume.    Afebrile  WBCs 24.7 on 12/4  Recheck Wednesday 9.  Hypertension.  Patient on Lotrel 5-20 mg daily as needed prior to admission.   Vitals:   08/25/19 1956 08/26/19 0625  BP: 118/70 (!) 141/75  Pulse: 67 70  Resp: 17 17  Temp: 98.2 F (36.8 C) 98.1 F (36.7 C)  SpO2: 94% 93%  controlled 12/6, 12/7             Monitor with increased mobility 10.  Thrombocytopenia             Platelets 116 on 12/4  Recheck Wednesday 11.  Sleep disturbance:   See #3,4  Chamomile tea ordered as well  Improving overall 12. Hypoalbuminemia   Supplement initiated on 11/17 13. Slow transit constipation  Improving 14.  Acute blood loss anemia  Hemoglobin 11.9 on 12/4 15.  Macular degeneration  Home PreserVision tabs ordered 16.  Intermittent wheezing- inaudible today except with auscultation   Per discussion with therapies, intermittent and relieved with activity  Afebrile   Sputum culture ordered, pending  Chest x-ray personally reviewed,  unremarkable for infection  Flutter valve ordered, continue  Albuterol as needed ordered   Improving  Advised that Mrs. Blakenship use IS 10x per hour to prevent atelectasis.  LOS: 25 days A FACE TO FACE EVALUATION WAS PERFORMED  Martha Clan P Jaymes Hang 08/26/2019, 11:52 AM

## 2019-08-26 NOTE — Discharge Instructions (Signed)
Inpatient Rehab Discharge Instructions  SELENNA HOMMEL Discharge date and time: No discharge date for patient encounter.   Activities/Precautions/ Functional Status: Activity: activity as tolerated Diet: regular diet Wound Care: none needed Functional status:  ___ No restrictions     ___ Walk up steps independently ___ 24/7 supervision/assistance   ___ Walk up steps with assistance ___ Intermittent supervision/assistance  ___ Bathe/dress independently ___ Walk with walker     _x__ Bathe/dress with assistance ___ Walk Independently    ___ Shower independently ___ Walk with assistance    ___ Shower with assistance ___ No alcohol     ___ Return to work/school ________  Special Instructions: No driving smoking or alcohol     COMMUNITY REFERRALS UPON DISCHARGE:    Home Health:   PT & OT     Agency:[facility]   Phone:[phone]   Date of last service:08/27/2019  Medical Equipment/Items Ordered:WHEELCHAIR, Vassie Moselle & TUB BENCH  Agency/Supplier:[facility]   [phone]   STROKE/TIA DISCHARGE INSTRUCTIONS SMOKING Cigarette smoking nearly doubles your risk of having a stroke & is the single most alterable risk factor  If you smoke or have smoked in the last 12 months, you are advised to quit smoking for your health.  Most of the excess cardiovascular risk related to smoking disappears within a year of stopping.  Ask you doctor about anti-smoking medications  [place] Quit Line: 1-800-QUIT NOW  Free Smoking Cessation Classes [phone]  CHOLESTEROL Know your levels; limit fat & cholesterol in your diet  Lipid Panel     Component Value Date/Time   CHOL 188 07/31/2019 0547   TRIG 67 07/31/2019 0547   HDL 45 07/31/2019 0547   CHOLHDL 4.2 07/31/2019 0547   VLDL 13 07/31/2019 0547   [place] 130 (H) 07/31/2019 0547      Many patients benefit from treatment even if their cholesterol is at goal.  Goal: Total Cholesterol (CHOL) less than 160  Goal:   Triglycerides (TRIG) less than 150  Goal:  HDL greater than 40  Goal:  LDL (LDLCALC) less than 100   BLOOD PRESSURE American Stroke Association blood pressure target is less that 120/80 mm/Hg  Your discharge blood pressure is:  BP: (!) 148/81  Monitor your blood pressure  Limit your salt and alcohol intake  Many individuals will require more than one medication for high blood pressure  DIABETES (A1c is a blood sugar average for last 3 months) Goal HGBA1c is under 7% (HBGA1c is blood sugar average for last 3 months)  Diabetes: No known diagnosis of diabetes    Lab Results  Component Value Date   HGBA1C 5.0 07/31/2019     Your HGBA1c can be lowered with medications, healthy diet, and exercise.  Check your blood sugar as directed by your physician  Call your physician if you experience unexplained or low blood sugars.  PHYSICAL ACTIVITY/REHABILITATION Goal is 30 minutes at least 4 days per week  Activity: Increase activity slowly, Therapies: Physical Therapy: Home Health Return to work:   Activity decreases your risk of heart attack and stroke and makes your heart stronger.  It helps control your weight and blood pressure; helps you relax and can improve your mood.  Participate in a regular exercise program.  Talk with your doctor about the best form of exercise for you (dancing, walking, swimming, cycling).  DIET/WEIGHT Goal is to maintain a healthy weight  Your discharge diet is:  Diet Order  Diet regular Room service appropriate? Yes; Fluid consistency: Thin  Diet effective now              liquids Your height is:  Height: 5' (152.4 cm) Your current weight is: Weight: 63.2 kg Your Body Mass Index (BMI) is:  BMI (Calculated): 27.21  Following the type of diet specifically designed for you will help prevent another stroke.  Your goal weight range is:    Your goal Body Mass Index (BMI) is 19-24.  Healthy food habits can help reduce 3 risk factors for  stroke:  High cholesterol, hypertension, and excess weight.  RESOURCES Stroke/Support Group:  Call [phone]   STROKE EDUCATION PROVIDED/REVIEWED AND GIVEN TO PATIENT Stroke warning signs and symptoms How to activate emergency medical system (call 911). Medications prescribed at discharge. Need for follow-up after discharge. Personal risk factors for stroke. Pneumonia vaccine given:  Flu vaccine given:  My questions have been answered, the writing is legible, and I understand these instructions.  I will adhere to these goals & educational materials that have been provided to me after my discharge from the hospital.      My questions have been answered and I understand these instructions. I will adhere to these goals and the provided educational materials after my discharge from the hospital.  Patient/Caregiver Signature _______________________________ Date __________  Clinician Signature _______________________________________ Date __________  Please bring this form and your medication list with you to all your follow-up doctor's appointments.

## 2019-08-26 NOTE — Progress Notes (Signed)
Physical Therapy Session Note  Patient Details  Name: Abigail Moreno MRN: [id number] Date of Birth: Jul 25, 1929  Today's Date: 08/26/2019 PT Individual Time: [id number] PT Individual Time Calculation (min): 43 min   Short Term Goals: Week 4:  PT Short Term Goal 1 (Week 4): = to LTGs based on ELOS  Skilled Therapeutic Interventions/Progress Updates:     Pt seated in recliner upon PT arrival, agreeable to therapy tx and denies pain. Pt performed ist>stand with supervision and performed stand pivot to w/c with RW and supervision. Pt transported to the gym for time management. Pt ambulated x 10 ft to the mat with RW and supervision, cues for L foot placement with turn to sit. Sit>supine with min assist for L LE management, rolling on the mat with CGA and cues for techniques. Pt performed therex this session for HEP, handout provided. Performed the following exercises, 2 x 10 each with cues for techniques/proper form:    Exercises  Supine Bridge - 10 reps - 2 sets - 1x daily - 7x weekly  Supine Heel Slides - 10 reps - 2 sets - 1x daily - 7x weekly  Clamshell - 10 reps - 2 sets - 1x daily - 7x weekly  Calf Stretch- with Caregiver - 2 x 30 sec Seated Hip Abduction with Resistance - 10 reps - 2 sets - 1x daily - 7x weekly  Seated March - 10 reps - 2 sets - 1x daily - 7x weekly  Seated Calf Stretch with Strap - 2 x 30 sec Pt transferred supine>sit with min assist for trunk elevation. Stand pivot back to w/c with CGA and RW. Pt transported back to room and ambulated to/from bathroom 2 x 10 ft with RW and supervision, supervision for standing balance during clothing management, continent of bladder. Pt left in recliner with needs in reach and chair alarm set.   Therapy Documentation Precautions:  Precautions Precautions: Fall Precaution Comments: L hemiparesis Restrictions Weight Bearing Restrictions: No    Therapy/Group: Individual Therapy  Netta Corrigan, PT, DPT, CSRS 08/26/2019,  1:21 PM

## 2019-08-26 NOTE — Progress Notes (Signed)
Occupational Therapy Session Note  Patient Details  Name: [name] MRN: [id number] Date of Birth: June 23, 1929  Today's Date: 08/26/2019 OT Individual Time: 0355-9741 OT Individual Time Calculation (min): 75 min    Short Term Goals: Week 4:  OT Short Term Goal 1 (Week 4): STGs = LTGs  Skilled Therapeutic Interventions/Progress Updates:    Pt completed self care this morning, see ADL documentation below.  She then ambulated with CGA with RW to day room.  Worked on LUE pushing and pulling on NUstep bike holding onto bar well demonstrating improved grasp. Alternated between arm and legs to vary muscles challenged.  Pt ambulated back to her room with CGA and needed min A one time as her L toe caught and she lost her balance.    Created a HEP and set it up on her ipad. Pt practiced each exercise and reviewed them.   Pt resting in recliner with belt alarm on and all needs met.    Placed estim on L deltoid for cyclic unattended estim for 60 min.  Removed at 11am.  Pt tolerated well.  Saebo Stim Go 330 pulse width 35 Hz pulse rate On 8 sec/ off 8 sec Ramp up/ down 2 sec Symmetrical Biphasic wave form  Max intensity 133m at 500 Ohm load   Therapy Documentation Precautions:  Precautions Precautions: Fall Precaution Comments: L hemiparesis Restrictions Weight Bearing Restrictions: No    Vital Signs: Therapy Vitals Temp: 98.1 F (36.7 C) Temp Source: Oral Pulse Rate: 70 Resp: 17 BP: (!) 141/75 Patient Position (if appropriate): Sitting Oxygen Therapy SpO2: 93 % O2 Device: Room Air Pain: Pain Assessment Pain Scale: 0-10 Pain Score: 0-No pain Pain Type: Chronic pain Pain Location: Shoulder Pain Orientation: Right Pain Descriptors / Indicators: Aching;Discomfort Pain Frequency: Intermittent Pain Onset: With Activity Pain Intervention(s): Medication (See eMAR) ADL: ADL Eating: Independent Grooming: Independent Where Assessed-Grooming: Sitting at  sink Upper Body Bathing: Supervision/safety Where Assessed-Upper Body Bathing: Shower Lower Body Bathing: Supervision/safety Where Assessed-Lower Body Bathing: Shower Upper Body Dressing: Minimal assistance Where Assessed-Upper Body Dressing: Chair Lower Body Dressing: Minimal assistance Where Assessed-Lower Body Dressing: Chair Toileting: Minimal assistance Where Assessed-Toileting: TGlass blower/designer CTherapist, musicMethod: ACounselling psychologist GEnergy manager CCuratorMethod: AHeritage manager Grab bars, Transfer tub bench   Therapy/Group: Individual Therapy  , 08/26/2019, 10:05 AM

## 2019-08-26 NOTE — Progress Notes (Signed)
Physical Therapy Discharge Summary  Patient Details  Name: Abigail Moreno MRN: [id number] Date of Birth: [birth year]  Today's Date: 08/26/2019 PT Individual Time: 1112-1206 PT Individual Time Calculation (min): 54 min    Patient has met 3 of 7 long term goals due to improved activity tolerance, improved balance, improved postural control, increased strength, increased range of motion, ability to compensate for deficits, functional use of  left upper extremity and left lower extremity, improved attention, improved awareness and improved coordination.  Patient to discharge at an ambulatory level Rudd. Patient's care partners, daughter and son, attended hands-on family training and are independent to provide the necessary physical and cognitive assistance at discharge.  Reasons goals not met: Patient did not meet supervision bed mobility goal as she continues to require min assist for B LE management sit>supine in the bed and trunk upright exiting the bed. Patient did not meet her supervision car transfer goal as she requires min assist for L LE management in/out of the vehicle as well as min assist for lifting into standing from low sedan height. Patient did not meet her CGA stair navigation goal though improved significantly pt continues to require min assist for lifting on ascent.  Recommendation:  Patient will benefit from ongoing skilled PT services in home health setting to continue to advance safe functional mobility, address ongoing impairments in L hemiparesis, L LE motor control/coordination, gait training, stair navigation, transfer training, standing balance, and minimize fall risk.  Equipment: RW and wheelchair  Reasons for discharge: treatment goals met and discharge from hospital  Patient/family agrees with progress made and goals achieved: Yes  Skilled Therapeutic Interventions/Progress Updates:  Patient received sitting in recliner and agreeable to therapy session.  Sit>stand recliner>RW with pt requiring significant time to come to standing but able to do with supervision - pt placing L UE on/off RW without assistance. Gait training ~175f to gym using RW with close supervision and intermittent cuing for increased L LE foot clearance and step length. Ascended/descended 4 steps using B UE support on L HR via side stepping pattern with min assist for lifting on ascent and CGA for steadying on descent with pt able to place L LE properly without manual facilitation on descent - min cuing throughout for improved foot placement on steps. Transported to ortho gym in w/c. Ambulatory simulated car transfer (sedan height) using RW with close supervision during ambulation and turning to sit then min assist for L LE placement in/out of car and coming to standing from low height of vehicle. Ambulated ~173fup/down ramp using RW with CGA for steadying and cuing for safe AD management as well as ensuring L LE foot clearance during swing. Transported back to room in w/c. Stand pivot to recliner using RW with close supervision throughout and min cuing for increased L LE backwards step towards chair for improved balance. Therapist reiterated education regarding pt's need for her daughter to provide close supervision during all standing/ambulatory tasks as well as reinforced need to wear shoes with toe cap and L LE AFO for ambulation with RW. Pt left seated in recliner with needs in reach and seat belt alarm on.   PT Discharge Precautions/Restrictions Precautions Precautions: Fall Precaution Comments: L hemiparesis Restrictions Weight Bearing Restrictions: No Pain Pain Assessment Pain Scale: 0-10 Pain Score: 6  Pain Type: Chronic pain Pain Location: Shoulder Pain Orientation: Right Pain Descriptors / Indicators: Aching;Discomfort Pain Frequency: Intermittent Pain Onset: With Activity Pain Intervention(s): Repositioned;Rest;Relaxation Perception  Perception Perception:  Within Functional  Limits Praxis Praxis: Intact  Cognition Overall Cognitive Status: Within Functional Limits for tasks assessed Arousal/Alertness: Awake/alert Orientation Level: Oriented X4 Attention: Focused;Sustained Focused Attention: Appears intact Sustained Attention: Appears intact Memory: Appears intact Awareness: Appears intact Safety/Judgment: Appears intact Sensation Sensation Light Touch: Appears Intact Hot/Cold: Not tested Proprioception: Impaired by gross assessment(continues to require cuing for L LE positioning during functional mobility due to impaired proprioception) Stereognosis: Not tested Coordination Gross Motor Movements are Fluid and Coordinated: No Fine Motor Movements are Fluid and Coordinated: No Coordination and Movement Description: gross motor movements continue to be impaired due to L hemiparesis, impaired L LE motor control/coordination, and impaired balance Heel Shin Test: impaired L LE 9 Hole Peg Test: box and block test 10 on L, 24 on R Motor  Motor Motor: Hemiplegia;Abnormal postural alignment and control;Abnormal tone Motor - Discharge Observations: L hemiparesis though improved  Mobility  Bed Mobility Bed Mobility: Supine to Sit;Sit to Supine Supine to Sit: Minimal Assistance - Patient > 75% Sit to Supine: Minimal Assistance - Patient > 75% Transfers Transfers: Sit to Stand;Stand to Sit;Stand Pivot Transfers Sit to Stand: Supervision/Verbal cueing Stand to Sit: Supervision/Verbal cueing Stand Pivot Transfers: Supervision/Verbal cueing Stand Pivot Transfer Details: Verbal cues for technique;Verbal cues for sequencing;Verbal cues for gait pattern;Verbal cues for safe use of DME/AE Transfer (Assistive device): Rolling walker Locomotion  Gait Ambulation: Yes Gait Assistance: Supervision/Verbal cueing Gait Distance (Feet): 150 Feet Assistive device: Rolling walker Gait Assistance Details: Verbal cues for technique;Verbal cues for gait  pattern Gait Gait: Yes Gait Pattern: Impaired Gait Pattern: Decreased hip/knee flexion - left;Decreased step length - left;Decreased stance time - right;Poor foot clearance - left Gait velocity: significantly decreased Stairs / Additional Locomotion Stairs: Yes Stairs Assistance: Minimal Assistance - Patient > 75%;Contact Guard/Touching assist Stair Management Technique: One rail Left Number of Stairs: 4 Height of Stairs: 6 Ramp: Contact Guard/touching assist Wheelchair Mobility Wheelchair Mobility: No  Trunk/Postural Assessment  Cervical Assessment Cervical Assessment: Exceptions to WFL(flexed head position) Thoracic Assessment Thoracic Assessment: Exceptions to WFL(thoracic kyphosis) Lumbar Assessment Lumbar Assessment: Exceptions to WFL(posterior pelvic tilt) Postural Control Postural Control: Deficits on evaluation Righting Reactions: delayed, but improved from admission Protective Responses: delayed, but improved from admission Postural Limitations: decreased with flexed trunk in sitting and standing  Balance Balance Balance Assessed: Yes Static Sitting Balance Static Sitting - Level of Assistance: 7: Independent Dynamic Sitting Balance Dynamic Sitting - Level of Assistance: 5: Stand by assistance Static Standing Balance Static Standing - Level of Assistance: 5: Stand by assistance Dynamic Standing Balance Dynamic Standing - Level of Assistance: 5: Stand by assistance;4: Min assist Extremity Assessment      RLE Assessment RLE Assessment: Within Functional Limits General Strength Comments: strength grossly 4+/5 throughout LLE Assessment LLE Assessment: Exceptions to [facility] LLE Strength Left Hip Flexion: 3+/5 Left Hip Extension: 3/5 Left Knee Flexion: 3-/5 Left Knee Extension: 3+/5 Left Ankle Dorsiflexion: 2+/5 Left Ankle Plantar Flexion: 2+/5    Tawana Scale, PT, DPT 08/26/2019, 7:56 AM

## 2019-08-27 LAB — CULTURE, RESPIRATORY W GRAM STAIN: Culture: NORMAL

## 2019-08-27 NOTE — Plan of Care (Signed)
Discharge instructions provided to pt and son by Silvestre Mesi, Progress. Pt d/c with personal belongings and equipment. Pt verbalized and understanding of d/c instructions as well as medications and follow up appts.

## 2019-08-27 NOTE — Progress Notes (Signed)
Equipment not at bedside at this time. Son to pick up pt this am

## 2019-08-27 NOTE — Progress Notes (Signed)
Picked up home med from pharmacy, returned to pt.

## 2019-08-29 ENCOUNTER — Telehealth: Payer: Self-pay

## 2019-08-29 DIAGNOSIS — Z96643 Presence of artificial hip joint, bilateral: Secondary | ICD-10-CM | POA: Diagnosis not present

## 2019-08-29 DIAGNOSIS — K5901 Slow transit constipation: Secondary | ICD-10-CM | POA: Diagnosis not present

## 2019-08-29 DIAGNOSIS — R32 Unspecified urinary incontinence: Secondary | ICD-10-CM | POA: Diagnosis not present

## 2019-08-29 DIAGNOSIS — C9110 Chronic lymphocytic leukemia of B-cell type not having achieved remission: Secondary | ICD-10-CM | POA: Diagnosis not present

## 2019-08-29 DIAGNOSIS — D473 Essential (hemorrhagic) thrombocythemia: Secondary | ICD-10-CM | POA: Diagnosis not present

## 2019-08-29 DIAGNOSIS — Z9181 History of falling: Secondary | ICD-10-CM | POA: Diagnosis not present

## 2019-08-29 DIAGNOSIS — H3530 Unspecified macular degeneration: Secondary | ICD-10-CM | POA: Diagnosis not present

## 2019-08-29 DIAGNOSIS — E785 Hyperlipidemia, unspecified: Secondary | ICD-10-CM | POA: Diagnosis not present

## 2019-08-29 DIAGNOSIS — M810 Age-related osteoporosis without current pathological fracture: Secondary | ICD-10-CM | POA: Diagnosis not present

## 2019-08-29 DIAGNOSIS — M19049 Primary osteoarthritis, unspecified hand: Secondary | ICD-10-CM | POA: Diagnosis not present

## 2019-08-29 DIAGNOSIS — I69354 Hemiplegia and hemiparesis following cerebral infarction affecting left non-dominant side: Secondary | ICD-10-CM | POA: Diagnosis not present

## 2019-08-29 DIAGNOSIS — H9193 Unspecified hearing loss, bilateral: Secondary | ICD-10-CM | POA: Diagnosis not present

## 2019-08-29 DIAGNOSIS — I10 Essential (primary) hypertension: Secondary | ICD-10-CM | POA: Diagnosis not present

## 2019-08-29 DIAGNOSIS — M1711 Unilateral primary osteoarthritis, right knee: Secondary | ICD-10-CM | POA: Diagnosis not present

## 2019-08-29 DIAGNOSIS — F419 Anxiety disorder, unspecified: Secondary | ICD-10-CM | POA: Diagnosis not present

## 2019-08-29 DIAGNOSIS — Z853 Personal history of malignant neoplasm of breast: Secondary | ICD-10-CM | POA: Diagnosis not present

## 2019-08-29 NOTE — Telephone Encounter (Signed)
Transitional Care call--daughter Malinda   1. Are you/is patient experiencing any problems since coming home? No Are there any questions regarding any aspect of care? No 2. Are there any questions regarding medications administration/dosing? No Are meds being taken as prescribed? Yes  Patient should review meds with caller to confirm 3. Have there been any falls? No 4. Has Home Health been to the house and/or have they contacted you? Yes If not, have you tried to contact them? Can we help you contact them? 5. Are bowels and bladder emptying properly? Yes Are there any unexpected incontinence issues? No If applicable, is patient following bowel/bladder programs? 6. Any fevers, problems with breathing, unexpected pain? No 7. Are there any skin problems or new areas of breakdown? No 8. Has the patient/family member arranged specialty MD follow up (ie cardiology/neurology/renal/surgical/etc)? Yes Can we help arrange? 9. Does the patient need any other services or support that we can help arrange? No 10. Are caregivers following through as expected in assisting the patient? Yes 11. Has the patient quit smoking, drinking alcohol, or using drugs as recommended? Yes  Appointment time 9:20 am arrive time 9:40 am with Eunice 1st then Dr. Posey Pronto [phone]

## 2019-09-02 DIAGNOSIS — I69354 Hemiplegia and hemiparesis following cerebral infarction affecting left non-dominant side: Secondary | ICD-10-CM | POA: Diagnosis not present

## 2019-09-02 DIAGNOSIS — L304 Erythema intertrigo: Secondary | ICD-10-CM | POA: Diagnosis not present

## 2019-09-02 DIAGNOSIS — C9110 Chronic lymphocytic leukemia of B-cell type not having achieved remission: Secondary | ICD-10-CM | POA: Diagnosis not present

## 2019-09-02 DIAGNOSIS — I10 Essential (primary) hypertension: Secondary | ICD-10-CM | POA: Diagnosis not present

## 2019-09-02 DIAGNOSIS — Z7189 Other specified counseling: Secondary | ICD-10-CM | POA: Diagnosis not present

## 2019-09-03 DIAGNOSIS — R2689 Other abnormalities of gait and mobility: Secondary | ICD-10-CM | POA: Diagnosis not present

## 2019-09-10 ENCOUNTER — Other Ambulatory Visit: Payer: Self-pay

## 2019-09-10 ENCOUNTER — Encounter: Payer: Self-pay | Admitting: Registered Nurse

## 2019-09-10 ENCOUNTER — Encounter: Payer: PPO | Attending: Registered Nurse | Admitting: Registered Nurse

## 2019-09-10 VITALS — BP 138/86 | HR 60 | Temp 97.5°F

## 2019-09-10 DIAGNOSIS — I10 Essential (primary) hypertension: Secondary | ICD-10-CM | POA: Insufficient documentation

## 2019-09-10 DIAGNOSIS — H3530 Unspecified macular degeneration: Secondary | ICD-10-CM | POA: Diagnosis not present

## 2019-09-10 DIAGNOSIS — I6350 Cerebral infarction due to unspecified occlusion or stenosis of unspecified cerebral artery: Secondary | ICD-10-CM | POA: Insufficient documentation

## 2019-09-10 DIAGNOSIS — M19049 Primary osteoarthritis, unspecified hand: Secondary | ICD-10-CM | POA: Diagnosis not present

## 2019-09-10 DIAGNOSIS — I69354 Hemiplegia and hemiparesis following cerebral infarction affecting left non-dominant side: Secondary | ICD-10-CM | POA: Diagnosis not present

## 2019-09-10 DIAGNOSIS — R32 Unspecified urinary incontinence: Secondary | ICD-10-CM | POA: Diagnosis not present

## 2019-09-10 DIAGNOSIS — E785 Hyperlipidemia, unspecified: Secondary | ICD-10-CM | POA: Diagnosis not present

## 2019-09-10 DIAGNOSIS — D473 Essential (hemorrhagic) thrombocythemia: Secondary | ICD-10-CM | POA: Diagnosis not present

## 2019-09-10 DIAGNOSIS — C9110 Chronic lymphocytic leukemia of B-cell type not having achieved remission: Secondary | ICD-10-CM | POA: Diagnosis not present

## 2019-09-10 DIAGNOSIS — Z856 Personal history of leukemia: Secondary | ICD-10-CM | POA: Insufficient documentation

## 2019-09-10 DIAGNOSIS — M1711 Unilateral primary osteoarthritis, right knee: Secondary | ICD-10-CM | POA: Diagnosis not present

## 2019-09-10 DIAGNOSIS — F329 Major depressive disorder, single episode, unspecified: Secondary | ICD-10-CM | POA: Insufficient documentation

## 2019-09-10 DIAGNOSIS — Z9181 History of falling: Secondary | ICD-10-CM | POA: Diagnosis not present

## 2019-09-10 DIAGNOSIS — M810 Age-related osteoporosis without current pathological fracture: Secondary | ICD-10-CM | POA: Diagnosis not present

## 2019-09-10 DIAGNOSIS — F419 Anxiety disorder, unspecified: Secondary | ICD-10-CM | POA: Diagnosis not present

## 2019-09-10 DIAGNOSIS — K5901 Slow transit constipation: Secondary | ICD-10-CM | POA: Diagnosis not present

## 2019-09-10 DIAGNOSIS — Z96643 Presence of artificial hip joint, bilateral: Secondary | ICD-10-CM | POA: Diagnosis not present

## 2019-09-10 DIAGNOSIS — Z853 Personal history of malignant neoplasm of breast: Secondary | ICD-10-CM | POA: Diagnosis not present

## 2019-09-10 DIAGNOSIS — H9193 Unspecified hearing loss, bilateral: Secondary | ICD-10-CM | POA: Diagnosis not present

## 2019-09-10 NOTE — Progress Notes (Signed)
Subjective:    Patient ID: Abigail Moreno, female    DOB: 11-01-1928, 85 y.o.   MRN: [id number]  HPI: Abigail Moreno is a 85 y.o. female who is here for transitional care visit in follow up of her right pontine CVA, Essential Hypertension, reactive depression and personal history of CLL.  She presented to [facility] Emergency Room on 07/30/2019 she had a syncopal episode landing on her face and persistent weakness of left upper and left lower extremity. Neurology was consulted, recommended aspirin and plavix x 3 weeks then aspirin alone.  CT Angio Head W or WO Contrast IMPRESSION: 1. No emergent large vessel occlusion or high-grade stenosis of the intracranial arteries. 2. No dissection, aneurysm or hemodynamically significant stenosis of the carotid or vertebral arteries. 3. Aortic Atherosclerosis (ICD10-I70.0).  MR Brain WO Contrast:  IMPRESSION: 1. 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 No hemorrhage or mass effect. 2. Normal intracranial MRA. 3. Chronic ischemic microangiopathy.  [name] was admitted to inpatient rehabilitation on 08/01/2019 and discharged home on 08/27/2019. She is receiving Home Health Therapy with [facility]. Also reports she has a good appetite. She states she has pain in her right shoulder. She rated her pain 0, on health and history form. Her current exercise regime is walking with her walker in the home.   Arrived in wheelchair Son Abigail Moreno in room all questions answered.   Pain Inventory Average Pain 3 Pain Right Now 0 My pain is aching  In the last 24 hours, has pain interfered with the following? General activity 0 Relation with others 0 Enjoyment of life 0 What TIME of day is your pain at its worst? night Sleep (in general) Good  Pain is worse with: n/a Pain improves with: medication Relief from Meds: 10  Mobility walk with assistance use a walker ability to climb steps?  yes do you drive?  no use a  wheelchair  Function retired I need assistance with the following:  feeding, dressing, bathing, toileting and meal prep Do you have any goals in this area?  yes  Neuro/Psych bladder control problems weakness trouble walking anxiety  Prior Studies transitional care  Physicians involved in your care transitional care   Family History  Problem Relation Age of Onset  . Pancreatic cancer Maternal Grandmother   . Cancer Paternal Grandmother        ? type   . Cancer Other        breast-1st paternal cousin-no treatment, liver cancer-2nd maternal cousin  . Hypertension Mother   . Hypertension Sister        x2  . High Cholesterol Sister        x2  . Heart attack Father    Social History   Socioeconomic History  . Marital status: Married    Spouse name: Not on file  . Number of children: Not on file  . Years of education: Not on file  . Highest education level: Not on file  Occupational History  . Not on file  Tobacco Use  . Smoking status: Former Smoker    Packs/day: 0.25    Years: 2.00    Pack years: 0.50    Types: Cigarettes    Quit date: 09/18/1958    Years since quitting: 61.0  . Smokeless tobacco: Never Used  Substance and Sexual Activity  . Alcohol use: No    Alcohol/week: 0.0 standard drinks  . Drug use: No  . Sexual activity: Not Currently  Birth control/protection: Surgical    Comment: hysterectomy  Other Topics Concern  . Not on file  Social History Narrative  . Not on file   Social Determinants of Health   Financial Resource Strain:   . Difficulty of Paying Living Expenses: Not on file  Food Insecurity:   . Worried About Charity fundraiser in the Last Year: Not on file  . Ran Out of Food in the Last Year: Not on file  Transportation Needs:   . Lack of Transportation (Medical): Not on file  . Lack of Transportation (Non-Medical): Not on file  Physical Activity:   . Days of Exercise per Week: Not on file  . Minutes of Exercise per Session:  Not on file  Stress:   . Feeling of Stress : Not on file  Social Connections:   . Frequency of Communication with Friends and Family: Not on file  . Frequency of Social Gatherings with Friends and Family: Not on file  . Attends Religious Services: Not on file  . Active Member of Clubs or Organizations: Not on file  . Attends Archivist Meetings: Not on file  . Marital Status: Not on file   Past Surgical History:  Procedure Laterality Date  . ABDOMINAL HYSTERECTOMY  1972   UNILATERAL OOPHORECTOMY( LEFT)  . APPENDECTOMY    . EYE SURGERY Bilateral    cataracts  . INCONTINENCE SURGERY    . MASTECTOMY PARTIAL / LUMPECTOMY W/ AXILLARY LYMPHADENECTOMY  FEBRUARY 2004   LEFT BREAST  . OTHER SURGICAL HISTORY     cysts removed from breasts   . TOTAL HIP ARTHROPLASTY  12/26/2011   Procedure: TOTAL HIP ARTHROPLASTY ANTERIOR APPROACH;  Surgeon: Mauri Pole, MD;  Location: [facility];  Service: Orthopedics;  Laterality: Right;  . TOTAL HIP ARTHROPLASTY Left 09/17/2018   Procedure: TOTAL HIP ARTHROPLASTY ANTERIOR APPROACH;  Surgeon: Paralee Cancel, MD;  Location: [facility];  Service: Orthopedics;  Laterality: Left;  70 mins   Past Medical History:  Diagnosis Date  . Anxiety   . Arthritis of knee  FALL 2012   RIGHT KNEE, hips, and fingers  . Breast cancer ([place]) FEBRUARY 2004   T1, NO, ER/PR POSITIVE LEFT BREAST  . CLL (chronic lymphocytic leukemia) ([place]) 2004   Dr. Marko Plume is oncologist  . Dyspnea    occ sob with exertion  . History of blood transfusion    with first and maybe second delivery  . Hyperlipemia   . Hypertension   . Macular degeneration    both eyes  . Osteoporosis    borderline  . UTI (urinary tract infection)    on nitrofuratonin bid x 7 days started 09-12-18 am   BP (!) 151/84   Pulse 65   Temp (!) 97.5 F (36.4 C)   SpO2 92%   Opioid Risk Score:   Fall Risk Score:  `1  Depression screen PHQ 2/9  No flowsheet data found.  Review of Systems    Constitutional: Negative.   HENT: Negative.   Eyes: Positive for pain.  Respiratory: Negative.   Gastrointestinal: Negative.   Endocrine: Negative.   Genitourinary: Positive for difficulty urinating.  Musculoskeletal: Positive for gait problem.  Skin: Negative.   Allergic/Immunologic: Negative.   Neurological: Positive for weakness and headaches.  Psychiatric/Behavioral: The patient is nervous/anxious.   All other systems reviewed and are negative.      Objective:   Physical Exam Vitals and nursing note reviewed.  Constitutional:      Appearance:  Normal appearance.  Cardiovascular:     Rate and Rhythm: Normal rate and regular rhythm.     Pulses: Normal pulses.     Heart sounds: Normal heart sounds.  Musculoskeletal:     Cervical back: Normal range of motion and neck supple.     Comments: Normal Muscle Bulk and Muscle Testing Reveals:  Upper Extremities: Right: Full ROM and Muscle Strength 5/5 Left: Decreased ROM 30 Degrees and Muscle Strength 4/5 Lower Extremities: Full ROM and Muscle Strength 5/5 Arrived in wheelchair  Skin:    General: Skin is warm and dry.  Neurological:     Mental Status: She is alert and oriented to person, place, and time.  Psychiatric:        Mood and Affect: Mood normal.        Behavior: Behavior normal.           Assessment & Plan:  1. Right pontine CVA: Continue Home Health Therapy with [facility]: Has a HFU appointment with Neurology.  2.Essential Hypertension: Continue current medication regimen: PCP Following. Continue to Monitor.  3.Reactive depression: Continue Current Medication Regimen. PCP Following.  4. Personal history of CLL.PCP Following. Continue to Monitor.   20 minutes of face to face patient care time was spent during this visit. All questions were encouraged and answered.

## 2019-09-22 DIAGNOSIS — M19049 Primary osteoarthritis, unspecified hand: Secondary | ICD-10-CM | POA: Diagnosis not present

## 2019-09-22 DIAGNOSIS — H9193 Unspecified hearing loss, bilateral: Secondary | ICD-10-CM | POA: Diagnosis not present

## 2019-09-22 DIAGNOSIS — D473 Essential (hemorrhagic) thrombocythemia: Secondary | ICD-10-CM | POA: Diagnosis not present

## 2019-09-22 DIAGNOSIS — C9110 Chronic lymphocytic leukemia of B-cell type not having achieved remission: Secondary | ICD-10-CM | POA: Diagnosis not present

## 2019-09-22 DIAGNOSIS — Z96643 Presence of artificial hip joint, bilateral: Secondary | ICD-10-CM | POA: Diagnosis not present

## 2019-09-22 DIAGNOSIS — E785 Hyperlipidemia, unspecified: Secondary | ICD-10-CM | POA: Diagnosis not present

## 2019-09-22 DIAGNOSIS — F419 Anxiety disorder, unspecified: Secondary | ICD-10-CM | POA: Diagnosis not present

## 2019-09-22 DIAGNOSIS — R32 Unspecified urinary incontinence: Secondary | ICD-10-CM | POA: Diagnosis not present

## 2019-09-22 DIAGNOSIS — M1711 Unilateral primary osteoarthritis, right knee: Secondary | ICD-10-CM | POA: Diagnosis not present

## 2019-09-22 DIAGNOSIS — Z853 Personal history of malignant neoplasm of breast: Secondary | ICD-10-CM | POA: Diagnosis not present

## 2019-09-22 DIAGNOSIS — K5901 Slow transit constipation: Secondary | ICD-10-CM | POA: Diagnosis not present

## 2019-09-22 DIAGNOSIS — M810 Age-related osteoporosis without current pathological fracture: Secondary | ICD-10-CM | POA: Diagnosis not present

## 2019-09-22 DIAGNOSIS — I69354 Hemiplegia and hemiparesis following cerebral infarction affecting left non-dominant side: Secondary | ICD-10-CM | POA: Diagnosis not present

## 2019-09-22 DIAGNOSIS — Z9181 History of falling: Secondary | ICD-10-CM | POA: Diagnosis not present

## 2019-09-22 DIAGNOSIS — I10 Essential (primary) hypertension: Secondary | ICD-10-CM | POA: Diagnosis not present

## 2019-09-22 DIAGNOSIS — H3530 Unspecified macular degeneration: Secondary | ICD-10-CM | POA: Diagnosis not present

## 2019-09-26 DIAGNOSIS — H43813 Vitreous degeneration, bilateral: Secondary | ICD-10-CM | POA: Diagnosis not present

## 2019-09-26 DIAGNOSIS — H353213 Exudative age-related macular degeneration, right eye, with inactive scar: Secondary | ICD-10-CM | POA: Diagnosis not present

## 2019-09-26 DIAGNOSIS — H35423 Microcystoid degeneration of retina, bilateral: Secondary | ICD-10-CM | POA: Diagnosis not present

## 2019-09-26 DIAGNOSIS — H353221 Exudative age-related macular degeneration, left eye, with active choroidal neovascularization: Secondary | ICD-10-CM | POA: Diagnosis not present

## 2019-10-04 DIAGNOSIS — R2689 Other abnormalities of gait and mobility: Secondary | ICD-10-CM | POA: Diagnosis not present

## 2019-10-07 DIAGNOSIS — Z853 Personal history of malignant neoplasm of breast: Secondary | ICD-10-CM | POA: Diagnosis not present

## 2019-10-07 DIAGNOSIS — M19049 Primary osteoarthritis, unspecified hand: Secondary | ICD-10-CM | POA: Diagnosis not present

## 2019-10-07 DIAGNOSIS — C9110 Chronic lymphocytic leukemia of B-cell type not having achieved remission: Secondary | ICD-10-CM | POA: Diagnosis not present

## 2019-10-07 DIAGNOSIS — I10 Essential (primary) hypertension: Secondary | ICD-10-CM | POA: Diagnosis not present

## 2019-10-07 DIAGNOSIS — D473 Essential (hemorrhagic) thrombocythemia: Secondary | ICD-10-CM | POA: Diagnosis not present

## 2019-10-07 DIAGNOSIS — R32 Unspecified urinary incontinence: Secondary | ICD-10-CM | POA: Diagnosis not present

## 2019-10-07 DIAGNOSIS — I69354 Hemiplegia and hemiparesis following cerebral infarction affecting left non-dominant side: Secondary | ICD-10-CM | POA: Diagnosis not present

## 2019-10-07 DIAGNOSIS — K5901 Slow transit constipation: Secondary | ICD-10-CM | POA: Diagnosis not present

## 2019-10-07 DIAGNOSIS — Z96643 Presence of artificial hip joint, bilateral: Secondary | ICD-10-CM | POA: Diagnosis not present

## 2019-10-07 DIAGNOSIS — E785 Hyperlipidemia, unspecified: Secondary | ICD-10-CM | POA: Diagnosis not present

## 2019-10-07 DIAGNOSIS — Z9181 History of falling: Secondary | ICD-10-CM | POA: Diagnosis not present

## 2019-10-07 DIAGNOSIS — H3530 Unspecified macular degeneration: Secondary | ICD-10-CM | POA: Diagnosis not present

## 2019-10-07 DIAGNOSIS — M810 Age-related osteoporosis without current pathological fracture: Secondary | ICD-10-CM | POA: Diagnosis not present

## 2019-10-07 DIAGNOSIS — H9193 Unspecified hearing loss, bilateral: Secondary | ICD-10-CM | POA: Diagnosis not present

## 2019-10-07 DIAGNOSIS — M1711 Unilateral primary osteoarthritis, right knee: Secondary | ICD-10-CM | POA: Diagnosis not present

## 2019-10-07 DIAGNOSIS — F419 Anxiety disorder, unspecified: Secondary | ICD-10-CM | POA: Diagnosis not present

## 2019-10-16 ENCOUNTER — Ambulatory Visit: Payer: PPO | Admitting: Adult Health

## 2019-10-16 ENCOUNTER — Other Ambulatory Visit: Payer: Self-pay

## 2019-10-16 ENCOUNTER — Encounter: Payer: PPO | Admitting: Physical Medicine & Rehabilitation

## 2019-10-16 ENCOUNTER — Encounter: Payer: Self-pay | Admitting: Adult Health

## 2019-10-16 VITALS — BP 130/74 | HR 78 | Temp 97.3°F | Ht 60.0 in | Wt 141.0 lb

## 2019-10-16 DIAGNOSIS — I6350 Cerebral infarction due to unspecified occlusion or stenosis of unspecified cerebral artery: Secondary | ICD-10-CM

## 2019-10-16 DIAGNOSIS — E785 Hyperlipidemia, unspecified: Secondary | ICD-10-CM | POA: Diagnosis not present

## 2019-10-16 DIAGNOSIS — I10 Essential (primary) hypertension: Secondary | ICD-10-CM | POA: Diagnosis not present

## 2019-10-16 NOTE — Progress Notes (Signed)
I agree with the above plan 

## 2019-10-16 NOTE — Progress Notes (Signed)
[facility] [address] [place]. [place] [address] [phone]       HOSPITAL FOLLOW UP NOTE  [name] Date of Birth:  1928/12/03 Medical Record Number:  [id number]   Reason for Referral:  hospital stroke follow up    CHIEF COMPLAINT:  Chief Complaint  Patient presents with  . Hospitalization Follow-up    Son preesnt. Rm 9. No new concerns at this time. Patient mentioned that she can do her ADLs by herself. She is curretly working with PT while she lives with her daughter until she moves into Assisted Living.     HPI: Abigail Wolny Blankenshipis being seen today for in office hospital follow-up regarding right pontine infarct secondary to small vessel disease on 07/30/2019.  History obtained from patient, son and chart review. Reviewed all radiology images and labs personally.  Abigail Moreno is a 86 y.o. female with history of HTN and HLD who presented on 07/30/2019 with L sided weakness and fall after syncopal episode.  Evaluated by Dr. Leonie Man and stroke team with stroke work-up revealing small right pontine infarct as evidenced on MRI secondary to small vessel disease.  MRA unremarkable.  2D echo showed an EF of 65 to 70% without cardiac source of embolus identified.  Recommended DAPT for 3 weeks then aspirin alone as not previously on antithrombotic.  HTN stable.  No history or evidence of DM with A1c 5.0.  LDL 130 and recommend continuation of fish oil due to history of statin intolerance.  Other stroke risk factors include advanced age and former tobacco use without prior history of stroke.  Other active problems include macular degeneration, history of breast cancer and CLL.  Evaluated by therapies and recommended discharge to CIR for ongoing therapy on 08/01/2019.  She was started on baclofen during CIR admission due to spasticity pain as well as initiation of Xanax and Lexapro for mood stabilization.  She made good progress during CIR admission  and was discharged home with recommendation of home therapy on 08/27/2019.  Abigail Moreno is a 86 year old female who is being seen today for hospital follow-up accompanied by her son.  Residual deficits of LUE weakness and occasional left leg weakness but greatly improving.  Continues to work with home health therapy with [facility].  She continues to live with her daughter but has been able to become more independent with daily activities.  She will be transitioning to [facility] tomorrow and plans on continuing therapies.  She is able to ambulate with rolling walker but will use wheelchair for long distance.  She was completely independent prior to her stroke living alone.  Depression has been overall stable and continues on Lexapro 5 mg daily and Xanax as needed.  Completed 3 weeks DAPT and continues on aspirin alone without bleeding or bruising.  Continues on fish oil for HLD management.  Blood pressure today 130/74.  Denies new or worsening stroke/TIA symptoms.  No further concerns at this time.     ROS:   14 system review of systems performed and negative with exception of fatigue, hearing loss, incontinence, aching muscles, depression and anxiety  PMH:  Past Medical History:  Diagnosis Date  . Anxiety   . Arthritis of knee  FALL 2012   RIGHT KNEE, hips, and fingers  . Breast cancer ([place]) FEBRUARY 2004   T1, NO, ER/PR POSITIVE LEFT BREAST  . CLL (chronic lymphocytic leukemia) ([place]) 2004   Dr. Marko Plume is oncologist  . Dyspnea  occ sob with exertion  . History of blood transfusion    with first and maybe second delivery  . Hyperlipemia   . Hypertension   . Macular degeneration    both eyes  . Osteoporosis    borderline  . UTI (urinary tract infection)    on nitrofuratonin bid x 7 days started 09-12-18 am    PSH:  Past Surgical History:  Procedure Laterality Date  . ABDOMINAL HYSTERECTOMY  1972   UNILATERAL OOPHORECTOMY( LEFT)  . APPENDECTOMY    . EYE SURGERY  Bilateral    cataracts  . INCONTINENCE SURGERY    . MASTECTOMY PARTIAL / LUMPECTOMY W/ AXILLARY LYMPHADENECTOMY  FEBRUARY 2004   LEFT BREAST  . OTHER SURGICAL HISTORY     cysts removed from breasts   . TOTAL HIP ARTHROPLASTY  12/26/2011   Procedure: TOTAL HIP ARTHROPLASTY ANTERIOR APPROACH;  Surgeon: Mauri Pole, MD;  Location: [facility];  Service: Orthopedics;  Laterality: Right;  . TOTAL HIP ARTHROPLASTY Left 09/17/2018   Procedure: TOTAL HIP ARTHROPLASTY ANTERIOR APPROACH;  Surgeon: Paralee Cancel, MD;  Location: [facility];  Service: Orthopedics;  Laterality: Left;  70 mins    Social History:  Social History   Socioeconomic History  . Marital status: Married    Spouse name: Not on file  . Number of children: Not on file  . Years of education: Not on file  . Highest education level: Not on file  Occupational History  . Not on file  Tobacco Use  . Smoking status: Former Smoker    Packs/day: 0.25    Years: 2.00    Pack years: 0.50    Types: Cigarettes    Quit date: 09/18/1958    Years since quitting: 61.1  . Smokeless tobacco: Never Used  Substance and Sexual Activity  . Alcohol use: No    Alcohol/week: 0.0 standard drinks  . Drug use: No  . Sexual activity: Not Currently    Birth control/protection: Surgical    Comment: hysterectomy  Other Topics Concern  . Not on file  Social History Narrative  . Not on file   Social Determinants of Health   Financial Resource Strain:   . Difficulty of Paying Living Expenses: Not on file  Food Insecurity:   . Worried About Charity fundraiser in the Last Year: Not on file  . Ran Out of Food in the Last Year: Not on file  Transportation Needs:   . Lack of Transportation (Medical): Not on file  . Lack of Transportation (Non-Medical): Not on file  Physical Activity:   . Days of Exercise per Week: Not on file  . Minutes of Exercise per Session: Not on file  Stress:   . Feeling of Stress : Not on file  Social Connections:   .  Frequency of Communication with Friends and Family: Not on file  . Frequency of Social Gatherings with Friends and Family: Not on file  . Attends Religious Services: Not on file  . Active Member of Clubs or Organizations: Not on file  . Attends Archivist Meetings: Not on file  . Marital Status: Not on file  Intimate Partner Violence:   . Fear of Current or Ex-Partner: Not on file  . Emotionally Abused: Not on file  . Physically Abused: Not on file  . Sexually Abused: Not on file    Family History:  Family History  Problem Relation Age of Onset  . Pancreatic cancer Maternal Grandmother   . Cancer  Paternal Grandmother        ? type   . Cancer Other        breast-1st paternal cousin-no treatment, liver cancer-2nd maternal cousin  . Hypertension Mother   . Hypertension Sister        x2  . High Cholesterol Sister        x2  . Heart attack Father     Medications:   Current Outpatient Medications on File Prior to Visit  Medication Sig Dispense Refill  . acetaminophen (TYLENOL) 325 MG tablet Take 2 tablets (650 mg total) by mouth every 4 (four) hours as needed for mild pain (or temp > 37.5 C (99.5 F)). (Patient taking differently: Take 650 mg by mouth as needed for mild pain (or temp > 37.5 C (99.5 F)). )    . ALPRAZolam (XANAX) 0.25 MG tablet Take 1 tablet (0.25 mg total) by mouth 2 (two) times daily as needed for anxiety. 30 tablet 0  . aspirin EC 81 MG tablet Take 81 mg by mouth daily.    . baclofen (LIORESAL) 10 MG tablet Take 3 tablets (30 mg total) by mouth daily at 8 pm. (Patient taking differently: Take 10 mg by mouth daily at 8 pm. ) 90 tablet 1  . Calcium Carb-Cholecalciferol (CALCIUM 500 + D3 PO) Take 1 tablet by mouth 2 (two) times daily.     . Cinnamon 500 MG capsule Take 500 mg by mouth 2 (two) times daily.     Marland Kitchen CRANBERRY PO Take 1 tablet by mouth 2 (two) times daily.     Marland Kitchen escitalopram (LEXAPRO) 5 MG tablet Take 1 tablet (5 mg total) by mouth daily. (Patient  taking differently: Take 5 mg by mouth at bedtime. ) 30 tablet 1  . loratadine (CLARITIN) 10 MG tablet Take 10 mg by mouth daily at 12 noon.     . Multiple Vitamin (MULTIVITAMIN WITH MINERALS) TABS tablet Take 1 tablet by mouth daily. Women's One A Day 50+ Multivitamin    . Multiple Vitamins-Minerals (PRESERVISION AREDS 2 PO) Take 1 tablet by mouth 2 (two) times daily.    . Omega-3 Fatty Acids (FISH OIL) 1000 MG CAPS Take 1 capsule (1,000 mg total) by mouth 2 (two) times daily. 60 capsule 0  . docusate sodium (COLACE) 100 MG capsule Take 1 capsule (100 mg total) by mouth 2 (two) times daily. (Patient not taking: Reported on 10/16/2019) 10 capsule 0  . ferrous sulfate (FERROUSUL) 325 (65 FE) MG tablet Take 1 tablet (325 mg total) by mouth 3 (three) times daily with meals. (Patient not taking: Reported on 10/16/2019)  3  . polyethylene glycol (MIRALAX / GLYCOLAX) 17 g packet Take 17 g by mouth daily. (Patient not taking: Reported on 10/16/2019) 14 each 0   No current facility-administered medications on file prior to visit.    Allergies:   Allergies  Allergen Reactions  . Colesevelam Hcl Other (See Comments)     aches  . Ezetimibe Other (See Comments)    aches  . Procaine Hcl Swelling    nocaine=swelling  . Rosuvastatin Other (See Comments)    aches  . Simvastatin Other (See Comments)    aches  . Statins Other (See Comments)    aches  . Sulfa Antibiotics Other (See Comments)    Bacteria in intestines      Physical Exam  Vitals:   10/16/19 1304  BP: 130/74  Pulse: 78  Temp: (!) 97.3 F (36.3 C)  TempSrc: Oral  Weight:  141 lb (64 kg)  Height: 5' (1.524 m)   Body mass index is 27.54 kg/m. No exam data present  Depression screen [facility] 2/9 09/10/2019  Decreased Interest 1  Down, Depressed, Hopeless 0  PHQ - 2 Score 1  Altered sleeping 0  Tired, decreased energy 0  Change in appetite 1  Feeling bad or failure about yourself  1  Trouble concentrating 0  Moving slowly or  fidgety/restless 0  Suicidal thoughts 0  PHQ-9 Score 3  Difficult doing work/chores Somewhat difficult     General: well developed, well nourished,  pleasant elderly Caucasian female, seated, in no evident distress Head: head normocephalic and atraumatic.   Neck: supple with no carotid or supraclavicular bruits Cardiovascular: regular rate and rhythm, no murmurs Musculoskeletal: no deformity Skin:  no rash/petichiae Vascular:  Normal pulses all extremities   Neurologic Exam Mental Status: Awake and fully alert.   Normal speech and language.  Oriented to place and time. Recent and remote memory intact. Attention span, concentration and fund of knowledge appropriate. Mood and affect appropriate.  Cranial Nerves: Fundoscopic exam reveals sharp disc margins. Pupils equal, briskly reactive to light. Extraocular movements full without nystagmus. Visual fields full to confrontation. Hearing intact. Facial sensation intact.  Slight left lower facial weakness. Motor: Normal bulk and tone. Normal strength in all tested extremity muscles except LUE 4/5 and 4+/5 left ankle dorsiflexion weakness. Sensory.: intact to touch , pinprick , position and vibratory sensation.  Coordination: Rapid alternating movements normal in all extremities except slightly diminished left hand. Finger-to-nose and heel-to-shin performed accurately bilaterally. Gait and Station: Deferred as patient nonambulatory Reflexes: 1+ and symmetric. Toes downgoing.     NIHSS  2 Modified Rankin  3    Diagnostic Data (Labs, Imaging, Testing)  CT HEAD WO CONTRAST 07/30/2019 IMPRESSION: Soft tissue contusion on the forehead without underlying fracture. No other acute abnormality head or face. Chronic microvascular ischemic change.  CT ANGIO HEAD W OR WO CONTRAST CT ANGIO NECK W OR WO CONTRAST 07/30/2019 IMPRESSION: 1. No emergent large vessel occlusion or high-grade stenosis of the intracranial arteries. 2. No dissection,  aneurysm or hemodynamically significant stenosis of the carotid or vertebral arteries. 3. Aortic Atherosclerosis (ICD10-I70.0).  MR BRAIN WO CONTRAST 07/30/2019 IMPRESSION: 1. 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 No hemorrhage or mass effect. 2. Normal intracranial MRA. 3. Chronic ischemic microangiopathy.  MR MRA HEAD  07/30/2019 IMPRESSION: 1. 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 No hemorrhage or mass effect. 2. Normal intracranial MRA. 3. Chronic ischemic microangiopathy.  ECHOCARDIOGRAM 07/31/2019 IMPRESSIONS  1. Left ventricular ejection fraction, by visual estimation, is 65 to 70%. The left ventricle has normal function. There is mildly increased left ventricular hypertrophy.  2. The left ventricle has no regional wall motion abnormalities.  3. Global right ventricle has normal systolic function.The right ventricular size is normal. No increase in right ventricular wall thickness.  4. Left atrial size was normal.  5. Right atrial size was normal.  6. Presence of pericardial fat pad.  7. The pericardial effusion is circumferential.  8. Trivial pericardial effusion is present.  9. Mild mitral annular calcification. 10. The mitral valve is degenerative. No evidence of mitral valve regurgitation. 11. The tricuspid valve is grossly normal. Tricuspid valve regurgitation is trivial. 12. The aortic valve is tricuspid. Aortic valve regurgitation is not visualized. 13. The aortic valve is calcified and sclerotic. There is functional fusion of the RCC/LCC but this does not appear to represent a bicuspid  valve (86 year old patient). There is no significant stenosis or regurgitation present. 14. There is Mild calcification of the aortic valve. 15. There is Mild thickening of the aortic valve. 16. The pulmonic valve was grossly normal. Pulmonic valve regurgitation is not visualized. 17. There is mild dilatation of the ascending aorta measuring 40 mm. 18. TR  signal is inadequate for assessing pulmonary artery systolic pressure. 19. The inferior vena cava is normal in size with greater than 50% respiratory variability, suggesting right atrial pressure of 3 mmHg.     ASSESSMENT: Abigail Moreno is a 86 y.o. year old female presented with left sided weakness on 07/30/2019 with stroke work up showing small right pontine infarct secondary to small vessel disease. Vascular risk factors include HTN, HLD, advanced age and former tobacco use.  Has been recovering well from a stroke standpoint with mild left lower facial weakness, residual LUE weakness and mild LLE weakness with ongoing improvement    PLAN:  1. Right pontine stroke: Continue aspirin 81 mg daily  and fish oil for secondary stroke prevention. Maintain strict control of hypertension with blood pressure goal below 130/90, diabetes with hemoglobin A1c goal below 6.5% and cholesterol with LDL cholesterol (bad cholesterol) goal below 70 mg/dL.  I also advised the patient to eat a healthy diet with plenty of whole grains, cereals, fruits and vegetables, exercise regularly with at least 30 minutes of continuous activity daily and maintain ideal body weight. 2. HTN: Advised to continue current treatment regimen.  Today's BP stable.  Advised to continue to monitor at home along with continued follow-up with PCP for management 3. HLD: Advised to continue current treatment regimen along with continued follow-up with PCP for future prescribing and monitoring of lipid panel 4. Left-sided weakness: Greatly improved with ongoing participation in therapy.  Advised to continue ongoing participation with therapies in ALF which she will be transitioning to tomorrow.  Advised son to call office if ALF needs additional orders to continue therapy.  Advised to continue to use rolling walker at all times for fall prevention    Follow up in 3 months or call earlier if needed   Greater than 50% of time during this  45 minute visit was spent on counseling, explanation of diagnosis of right pontine stroke, reviewing risk factor management of HTN and HLD, planning of further management along with potential future management, and discussion with patient and family answering all questions.    Frann Rider, AGNP-BC  [facility] [address] [place], [place] [address]  Phone [phone] Fax [phone] Note: This document was prepared with digital dictation and possible smart phrase technology. Any transcriptional errors that result from this process are unintentional.

## 2019-10-16 NOTE — Patient Instructions (Addendum)
Continue aspirin 81 mg daily  and fish oil for secondary stroke prevention  Continue to follow up with PCP regarding cholesterol and blood pressure management   Continue to work with therapies for ongoing improvement  Continue to monitor blood pressure at home  Maintain strict control of hypertension with blood pressure goal below 130/90, diabetes with hemoglobin A1c goal below 6.5% and cholesterol with LDL cholesterol (bad cholesterol) goal below 70 mg/dL. I also advised the patient to eat a healthy diet with plenty of whole grains, cereals, fruits and vegetables, exercise regularly and maintain ideal body weight.  Followup in the future with me in 3 months or call earlier if needed       Thank you for coming to see [place] at [facility]. I hope we have been able to provide you high quality care today.  You may receive a patient satisfaction survey over the next few weeks. We would appreciate your feedback and comments so that we may continue to improve ourselves and the health of our patients.

## 2019-10-24 DIAGNOSIS — I6389 Other cerebral infarction: Secondary | ICD-10-CM | POA: Diagnosis not present

## 2019-10-24 DIAGNOSIS — H3533 Angioid streaks of macula: Secondary | ICD-10-CM | POA: Diagnosis not present

## 2019-10-24 DIAGNOSIS — E7849 Other hyperlipidemia: Secondary | ICD-10-CM | POA: Diagnosis not present

## 2019-10-24 DIAGNOSIS — R262 Difficulty in walking, not elsewhere classified: Secondary | ICD-10-CM | POA: Diagnosis not present

## 2019-10-24 DIAGNOSIS — R252 Cramp and spasm: Secondary | ICD-10-CM | POA: Diagnosis not present

## 2019-10-24 DIAGNOSIS — F39 Unspecified mood [affective] disorder: Secondary | ICD-10-CM | POA: Diagnosis not present

## 2019-10-24 DIAGNOSIS — F1320 Sedative, hypnotic or anxiolytic dependence, uncomplicated: Secondary | ICD-10-CM | POA: Diagnosis not present

## 2019-10-31 DIAGNOSIS — H3530 Unspecified macular degeneration: Secondary | ICD-10-CM | POA: Diagnosis not present

## 2019-10-31 DIAGNOSIS — R2689 Other abnormalities of gait and mobility: Secondary | ICD-10-CM | POA: Diagnosis not present

## 2019-10-31 DIAGNOSIS — I69354 Hemiplegia and hemiparesis following cerebral infarction affecting left non-dominant side: Secondary | ICD-10-CM | POA: Diagnosis not present

## 2019-11-04 DIAGNOSIS — R2689 Other abnormalities of gait and mobility: Secondary | ICD-10-CM | POA: Diagnosis not present

## 2019-11-04 DIAGNOSIS — B372 Candidiasis of skin and nail: Secondary | ICD-10-CM | POA: Diagnosis not present

## 2019-11-18 DIAGNOSIS — H3530 Unspecified macular degeneration: Secondary | ICD-10-CM | POA: Diagnosis not present

## 2019-11-18 DIAGNOSIS — I69354 Hemiplegia and hemiparesis following cerebral infarction affecting left non-dominant side: Secondary | ICD-10-CM | POA: Diagnosis not present

## 2019-11-18 DIAGNOSIS — R2689 Other abnormalities of gait and mobility: Secondary | ICD-10-CM | POA: Diagnosis not present

## 2019-11-18 DIAGNOSIS — M1990 Unspecified osteoarthritis, unspecified site: Secondary | ICD-10-CM | POA: Diagnosis not present

## 2019-11-18 DIAGNOSIS — I10 Essential (primary) hypertension: Secondary | ICD-10-CM | POA: Diagnosis not present

## 2019-11-21 DIAGNOSIS — I6789 Other cerebrovascular disease: Secondary | ICD-10-CM | POA: Diagnosis not present

## 2019-11-21 DIAGNOSIS — R300 Dysuria: Secondary | ICD-10-CM | POA: Diagnosis not present

## 2019-11-25 DIAGNOSIS — N390 Urinary tract infection, site not specified: Secondary | ICD-10-CM | POA: Diagnosis not present

## 2019-11-26 DIAGNOSIS — F39 Unspecified mood [affective] disorder: Secondary | ICD-10-CM | POA: Diagnosis not present

## 2019-11-26 DIAGNOSIS — F419 Anxiety disorder, unspecified: Secondary | ICD-10-CM | POA: Diagnosis not present

## 2019-12-02 DIAGNOSIS — R2689 Other abnormalities of gait and mobility: Secondary | ICD-10-CM | POA: Diagnosis not present

## 2019-12-19 DIAGNOSIS — I69354 Hemiplegia and hemiparesis following cerebral infarction affecting left non-dominant side: Secondary | ICD-10-CM | POA: Diagnosis not present

## 2019-12-19 DIAGNOSIS — M1990 Unspecified osteoarthritis, unspecified site: Secondary | ICD-10-CM | POA: Diagnosis not present

## 2019-12-19 DIAGNOSIS — R2689 Other abnormalities of gait and mobility: Secondary | ICD-10-CM | POA: Diagnosis not present

## 2019-12-19 DIAGNOSIS — I10 Essential (primary) hypertension: Secondary | ICD-10-CM | POA: Diagnosis not present

## 2019-12-19 DIAGNOSIS — H3530 Unspecified macular degeneration: Secondary | ICD-10-CM | POA: Diagnosis not present

## 2019-12-31 DIAGNOSIS — F419 Anxiety disorder, unspecified: Secondary | ICD-10-CM | POA: Diagnosis not present

## 2019-12-31 DIAGNOSIS — F39 Unspecified mood [affective] disorder: Secondary | ICD-10-CM | POA: Diagnosis not present

## 2020-01-02 DIAGNOSIS — H35423 Microcystoid degeneration of retina, bilateral: Secondary | ICD-10-CM | POA: Diagnosis not present

## 2020-01-02 DIAGNOSIS — H43813 Vitreous degeneration, bilateral: Secondary | ICD-10-CM | POA: Diagnosis not present

## 2020-01-02 DIAGNOSIS — R2689 Other abnormalities of gait and mobility: Secondary | ICD-10-CM | POA: Diagnosis not present

## 2020-01-02 DIAGNOSIS — H353213 Exudative age-related macular degeneration, right eye, with inactive scar: Secondary | ICD-10-CM | POA: Diagnosis not present

## 2020-01-02 DIAGNOSIS — H353221 Exudative age-related macular degeneration, left eye, with active choroidal neovascularization: Secondary | ICD-10-CM | POA: Diagnosis not present

## 2020-01-15 ENCOUNTER — Ambulatory Visit: Payer: PPO | Admitting: Adult Health

## 2020-01-16 DIAGNOSIS — S0083XA Contusion of other part of head, initial encounter: Secondary | ICD-10-CM | POA: Diagnosis not present

## 2020-01-16 DIAGNOSIS — R269 Unspecified abnormalities of gait and mobility: Secondary | ICD-10-CM | POA: Diagnosis not present

## 2020-01-16 DIAGNOSIS — Z888 Allergy status to other drugs, medicaments and biological substances status: Secondary | ICD-10-CM | POA: Diagnosis not present

## 2020-01-16 DIAGNOSIS — N3000 Acute cystitis without hematuria: Secondary | ICD-10-CM | POA: Diagnosis not present

## 2020-01-16 DIAGNOSIS — D72829 Elevated white blood cell count, unspecified: Secondary | ICD-10-CM | POA: Diagnosis not present

## 2020-01-16 DIAGNOSIS — S0003XA Contusion of scalp, initial encounter: Secondary | ICD-10-CM | POA: Diagnosis not present

## 2020-01-16 DIAGNOSIS — C9510 Chronic leukemia of unspecified cell type not having achieved remission: Secondary | ICD-10-CM | POA: Diagnosis not present

## 2020-01-16 DIAGNOSIS — Z043 Encounter for examination and observation following other accident: Secondary | ICD-10-CM | POA: Diagnosis not present

## 2020-01-16 DIAGNOSIS — G8191 Hemiplegia, unspecified affecting right dominant side: Secondary | ICD-10-CM | POA: Diagnosis not present

## 2020-01-16 DIAGNOSIS — G8911 Acute pain due to trauma: Secondary | ICD-10-CM | POA: Diagnosis not present

## 2020-01-16 DIAGNOSIS — S0990XA Unspecified injury of head, initial encounter: Secondary | ICD-10-CM | POA: Diagnosis not present

## 2020-01-16 DIAGNOSIS — Z882 Allergy status to sulfonamides status: Secondary | ICD-10-CM | POA: Diagnosis not present

## 2020-01-20 DIAGNOSIS — N3000 Acute cystitis without hematuria: Secondary | ICD-10-CM | POA: Diagnosis not present

## 2020-01-20 DIAGNOSIS — H3530 Unspecified macular degeneration: Secondary | ICD-10-CM | POA: Diagnosis not present

## 2020-01-20 DIAGNOSIS — R262 Difficulty in walking, not elsewhere classified: Secondary | ICD-10-CM | POA: Diagnosis not present

## 2020-01-21 DIAGNOSIS — F4322 Adjustment disorder with anxiety: Secondary | ICD-10-CM | POA: Diagnosis not present

## 2020-01-22 DIAGNOSIS — H3530 Unspecified macular degeneration: Secondary | ICD-10-CM | POA: Diagnosis not present

## 2020-01-22 DIAGNOSIS — I69354 Hemiplegia and hemiparesis following cerebral infarction affecting left non-dominant side: Secondary | ICD-10-CM | POA: Diagnosis not present

## 2020-01-22 DIAGNOSIS — I10 Essential (primary) hypertension: Secondary | ICD-10-CM | POA: Diagnosis not present

## 2020-01-22 DIAGNOSIS — M1990 Unspecified osteoarthritis, unspecified site: Secondary | ICD-10-CM | POA: Diagnosis not present

## 2020-01-22 DIAGNOSIS — R2689 Other abnormalities of gait and mobility: Secondary | ICD-10-CM | POA: Diagnosis not present

## 2020-01-30 DIAGNOSIS — B372 Candidiasis of skin and nail: Secondary | ICD-10-CM | POA: Diagnosis not present

## 2020-02-01 DIAGNOSIS — R2689 Other abnormalities of gait and mobility: Secondary | ICD-10-CM | POA: Diagnosis not present

## 2020-02-03 DIAGNOSIS — F39 Unspecified mood [affective] disorder: Secondary | ICD-10-CM | POA: Diagnosis not present

## 2020-02-03 DIAGNOSIS — R262 Difficulty in walking, not elsewhere classified: Secondary | ICD-10-CM | POA: Diagnosis not present

## 2020-02-03 DIAGNOSIS — I6789 Other cerebrovascular disease: Secondary | ICD-10-CM | POA: Diagnosis not present

## 2020-02-03 DIAGNOSIS — R319 Hematuria, unspecified: Secondary | ICD-10-CM | POA: Diagnosis not present

## 2020-02-03 DIAGNOSIS — N390 Urinary tract infection, site not specified: Secondary | ICD-10-CM | POA: Diagnosis not present

## 2020-02-04 ENCOUNTER — Other Ambulatory Visit: Payer: Self-pay

## 2020-02-04 ENCOUNTER — Encounter: Payer: Self-pay | Admitting: Adult Health

## 2020-02-04 ENCOUNTER — Ambulatory Visit (INDEPENDENT_AMBULATORY_CARE_PROVIDER_SITE_OTHER): Payer: PPO | Admitting: Adult Health

## 2020-02-04 VITALS — BP 151/87 | HR 57 | Ht 60.0 in | Wt 137.0 lb

## 2020-02-04 DIAGNOSIS — I6350 Cerebral infarction due to unspecified occlusion or stenosis of unspecified cerebral artery: Secondary | ICD-10-CM

## 2020-02-04 DIAGNOSIS — R252 Cramp and spasm: Secondary | ICD-10-CM

## 2020-02-04 DIAGNOSIS — I10 Essential (primary) hypertension: Secondary | ICD-10-CM

## 2020-02-04 DIAGNOSIS — E785 Hyperlipidemia, unspecified: Secondary | ICD-10-CM | POA: Diagnosis not present

## 2020-02-04 NOTE — Progress Notes (Signed)
[facility] [address] [place]. [place] [address] [phone]       STROKE FOLLOW UP NOTE  [name] Date of Birth:  [birth year] Medical Record Number:  [id number]   Reason for Referral: stroke follow up    CHIEF COMPLAINT:  Chief Complaint  Patient presents with  . Follow-up    stroke fu, rm 9, with facility staff, pt reports left leg cramps, no other concerns     HPI:  Today, 02/04/2020, [name] returns for follow up regarding right pontine stroke in 07/2019.  Since prior visit, she has moved to [facility] assisted living and is accompanied by facility staff.  Residual deficits of mild LUE weakness but endorses improvement.  Ambulates with rolling walker without any recent falls.  Continues to work with therapies.  Continues on aspirin and fish oil for secondary stroke prevention. Blood pressure today 151/87. She does report left ankle and foot cramping that has been present since rehab.  She reports eating a banana at night and will not experience cramps.  She is questioning if this is stroke related.  No further concerns at this time.     History provided for reference purposes only Initial visit 10/16/2019 JM: Abigail Moreno is a 86 year old female who is being seen today for hospital follow-up accompanied by her son.  Residual deficits of LUE weakness and occasional left leg weakness but greatly improving.  Continues to work with home health therapy with [facility].  She continues to live with her daughter but has been able to become more independent with daily activities.  She will be transitioning to [facility] tomorrow and plans on continuing therapies.  She is able to ambulate with rolling walker but will use wheelchair for long distance.  She was completely independent prior to her stroke living alone.  Depression has been overall stable and continues on Lexapro 5 mg daily and Xanax as needed.  Completed 3 weeks DAPT and  continues on aspirin alone without bleeding or bruising.  Continues on fish oil for HLD management.  Blood pressure today 130/74.  Denies new or worsening stroke/TIA symptoms.  No further concerns at this time.   Stroke admission 07/30/2019: Abigail Moreno is a 86 y.o. female with history of HTN and HLD who presented on 07/30/2019 with L sided weakness and fall after syncopal episode.  Evaluated by Dr. Leonie Man and stroke team with stroke work-up revealing small right pontine infarct as evidenced on MRI secondary to small vessel disease.  MRA unremarkable.  2D echo showed an EF of 65 to 70% without cardiac source of embolus identified.  Recommended DAPT for 3 weeks then aspirin alone as not previously on antithrombotic.  HTN stable.  No history or evidence of DM with A1c 5.0.  LDL 130 and recommend continuation of fish oil due to history of statin intolerance.  Other stroke risk factors include advanced age and former tobacco use without prior history of stroke.  Other active problems include macular degeneration, history of breast cancer and CLL.  Evaluated by therapies and recommended discharge to CIR for ongoing therapy on 08/01/2019.  She was started on baclofen during CIR admission due to spasticity pain as well as initiation of Xanax and Lexapro for mood stabilization.  She made good progress during CIR admission and was discharged home with recommendation of home therapy on 08/27/2019.     ROS:   14 system review of systems performed and negative with exception of weakness,  joint pain and cramping  PMH:  Past Medical History:  Diagnosis Date  . Anxiety   . Arthritis of knee  FALL 2012   RIGHT KNEE, hips, and fingers  . Breast cancer ([place]) FEBRUARY 2004   T1, NO, ER/PR POSITIVE LEFT BREAST  . CLL (chronic lymphocytic leukemia) ([place]) 2004   Dr. Marko Plume is oncologist  . Dyspnea    occ sob with exertion  . History of blood transfusion    with first and maybe second delivery  .  Hyperlipemia   . Hypertension   . Macular degeneration    both eyes  . Osteoporosis    borderline  . UTI (urinary tract infection)    on nitrofuratonin bid x 7 days started 09-12-18 am    PSH:  Past Surgical History:  Procedure Laterality Date  . ABDOMINAL HYSTERECTOMY  1972   UNILATERAL OOPHORECTOMY( LEFT)  . APPENDECTOMY    . EYE SURGERY Bilateral    cataracts  . INCONTINENCE SURGERY    . MASTECTOMY PARTIAL / LUMPECTOMY W/ AXILLARY LYMPHADENECTOMY  FEBRUARY 2004   LEFT BREAST  . OTHER SURGICAL HISTORY     cysts removed from breasts   . TOTAL HIP ARTHROPLASTY  12/26/2011   Procedure: TOTAL HIP ARTHROPLASTY ANTERIOR APPROACH;  Surgeon: Mauri Pole, MD;  Location: [facility];  Service: Orthopedics;  Laterality: Right;  . TOTAL HIP ARTHROPLASTY Left 09/17/2018   Procedure: TOTAL HIP ARTHROPLASTY ANTERIOR APPROACH;  Surgeon: Paralee Cancel, MD;  Location: [facility];  Service: Orthopedics;  Laterality: Left;  70 mins    Social History:  Social History   Socioeconomic History  . Marital status: Married    Spouse name: Not on file  . Number of children: Not on file  . Years of education: Not on file  . Highest education level: Not on file  Occupational History  . Not on file  Tobacco Use  . Smoking status: Former Smoker    Packs/day: 0.25    Years: 2.00    Pack years: 0.50    Types: Cigarettes    Quit date: 09/18/1958    Years since quitting: 61.4  . Smokeless tobacco: Never Used  Substance and Sexual Activity  . Alcohol use: No    Alcohol/week: 0.0 standard drinks  . Drug use: No  . Sexual activity: Not Currently    Birth control/protection: Surgical    Comment: hysterectomy  Other Topics Concern  . Not on file  Social History Narrative  . Not on file   Social Determinants of Health   Financial Resource Strain:   . Difficulty of Paying Living Expenses:   Food Insecurity:   . Worried About Charity fundraiser in the Last Year:   . Arboriculturist in the Last Year:    Transportation Needs:   . Film/video editor (Medical):   Marland Kitchen Lack of Transportation (Non-Medical):   Physical Activity:   . Days of Exercise per Week:   . Minutes of Exercise per Session:   Stress:   . Feeling of Stress :   Social Connections:   . Frequency of Communication with Friends and Family:   . Frequency of Social Gatherings with Friends and Family:   . Attends Religious Services:   . Active Member of Clubs or Organizations:   . Attends Archivist Meetings:   Marland Kitchen Marital Status:   Intimate Partner Violence:   . Fear of Current or Ex-Partner:   . Emotionally Abused:   Marland Kitchen Physically  Abused:   . Sexually Abused:     Family History:  Family History  Problem Relation Age of Onset  . Pancreatic cancer Maternal Grandmother   . Cancer Paternal Grandmother        ? type   . Cancer Other        breast-1st paternal cousin-no treatment, liver cancer-2nd maternal cousin  . Hypertension Mother   . Hypertension Sister        x2  . High Cholesterol Sister        x2  . Heart attack Father     Medications:   Current Outpatient Medications on File Prior to Visit  Medication Sig Dispense Refill  . acetaminophen (TYLENOL) 325 MG tablet Take 2 tablets (650 mg total) by mouth every 4 (four) hours as needed for mild pain (or temp > 37.5 C (99.5 F)). (Patient taking differently: Take 650 mg by mouth as needed for mild pain (or temp > 37.5 C (99.5 F)). )    . ALPRAZolam (XANAX) 0.25 MG tablet Take 1 tablet (0.25 mg total) by mouth 2 (two) times daily as needed for anxiety. 30 tablet 0  . aspirin EC 81 MG tablet Take 81 mg by mouth daily.    . baclofen (LIORESAL) 10 MG tablet Take 3 tablets (30 mg total) by mouth daily at 8 pm. (Patient taking differently: Take 10 mg by mouth daily at 8 pm. ) 90 tablet 1  . Calcium Carb-Cholecalciferol (CALCIUM 500 + D3 PO) Take 1 tablet by mouth 2 (two) times daily.     . Cinnamon 500 MG capsule Take 500 mg by mouth 2 (two) times daily.     Marland Kitchen  CRANBERRY PO Take 1 tablet by mouth 2 (two) times daily.     Marland Kitchen escitalopram (LEXAPRO) 5 MG tablet Take 1 tablet (5 mg total) by mouth daily. (Patient taking differently: Take 5 mg by mouth at bedtime. ) 30 tablet 1  . loratadine (CLARITIN) 10 MG tablet Take 10 mg by mouth daily at 12 noon.     . Multiple Vitamin (MULTIVITAMIN WITH MINERALS) TABS tablet Take 1 tablet by mouth daily. Women's One A Day 50+ Multivitamin    . Multiple Vitamins-Minerals (PRESERVISION AREDS 2 PO) Take 1 tablet by mouth 2 (two) times daily.    . Omega-3 Fatty Acids (FISH OIL) 1000 MG CAPS Take 1 capsule (1,000 mg total) by mouth 2 (two) times daily. 60 capsule 0   No current facility-administered medications on file prior to visit.    Allergies:   Allergies  Allergen Reactions  . Colesevelam Hcl Other (See Comments)     aches  . Ezetimibe Other (See Comments)    aches  . Procaine Hcl Swelling    nocaine=swelling  . Rosuvastatin Other (See Comments)    aches  . Simvastatin Other (See Comments)    aches  . Statins Other (See Comments)    aches  . Sulfa Antibiotics Other (See Comments)    Bacteria in intestines      Physical Exam  Vitals:   02/04/20 1231  BP: (!) 151/87  Pulse: (!) 57  Weight: 137 lb (62.1 kg)  Height: 5' (1.524 m)   Body mass index is 26.76 kg/m. No exam data present  General: Frail pleasant elderly Caucasian female, seated, in no evident distress Head: head normocephalic and atraumatic.   Neck: supple with no carotid or supraclavicular bruits Cardiovascular: regular rate and rhythm, no murmurs Musculoskeletal: no deformity Skin:  no rash/petichiae Vascular:  Normal pulses all extremities   Neurologic Exam Mental Status: Awake and fully alert. Normal speech and language. Oriented to place and time. Recent and remote memory intact. Attention span, concentration and fund of knowledge appropriate. Mood and affect appropriate.  Cranial Nerves: Pupils equal, briskly reactive to  light. Extraocular movements full without nystagmus. Visual fields full to confrontation. Hearing intact. Facial sensation intact.  Face, tongue and palate symmetric Motor: Normal bulk and tone. Normal strength in all tested extremity muscles except LUE mild drift and decreased hand dexterity Sensory.: intact to touch , pinprick , position and vibratory sensation.  Coordination: Rapid alternating movements normal in all extremities except slightly diminished left hand. Finger-to-nose and heel-to-shin performed accurately bilaterally. Gait and Station: Able to stand without assistance.  Stance is slightly hunched.  Gait demonstrates slow short steps with use of rolling walker Reflexes: 1+ and symmetric. Toes downgoing.       ASSESSMENT: Abigail Moreno is a 86 y.o. year old female presented with left sided weakness on 07/30/2019 with stroke work up showing small right pontine infarct secondary to small vessel disease. Vascular risk factors include HTN, HLD, advanced age and former tobacco use.  Residual stroke deficits of mild LUE weakness with ongoing improvement.  Complains of occasional left foot cramping which has been ongoing since rehab    PLAN:  1. Right pontine stroke:  -Continue aspirin 81 mg daily  and fish oil for secondary stroke prevention.  -Maintain strict control of hypertension with blood pressure goal below 130/90, diabetes with hemoglobin A1c goal below 6.5% and cholesterol with LDL cholesterol (bad cholesterol) goal below 70 mg/dL.  I also advised the patient to eat a healthy diet with plenty of whole grains, cereals, fruits and vegetables, exercise regularly with at least 30 minutes of continuous activity daily and maintain ideal body weight. 2. Left foot cramping: Likely not stroke related as no residual left leg weakness.  Advised to speak further with PCP or physical therapy in regards to further evaluation or exercises for possible benefit 3. HTN: Stable but slightly  on higher end.  Continue to follow with PCP for monitoring management 4. HLD: Continue fish oil and ongoing follow-up with PCP for monitoring and management   Overall stable from stroke standpoint and is monitored closely at ALF therefore recommend follow-up as needed   I spent 25 minutes of face-to-face and non-face-to-face time with patient and facility staff.  This included previsit chart review, lab review, study review, order entry, electronic health record documentation, patient education     Frann Rider, [facility]  [facility] [address] [place], [place] [address]  Phone [phone] Fax [phone] Note: This document was prepared with digital dictation and possible smart phrase technology. Any transcriptional errors that result from this process are unintentional.

## 2020-02-04 NOTE — Patient Instructions (Signed)
Continue aspirin 81 mg daily and fish oil for secondary stroke prevention  Continue to follow up with PCP regarding cholesterol and blood pressure management   Discuss further with your primary doctor or physical therapy in regards to foot cramping. At times, cramping is unknown but could possibly be due to planter fasciitis which can be further looked into by therapy  Continue to work with therapies for ongoing improvement  Continue to monitor blood pressure at home  Maintain strict control of hypertension with blood pressure goal below 130/90, diabetes with hemoglobin A1c goal below 6.5% and cholesterol with LDL cholesterol (bad cholesterol) goal below 70 mg/dL. I also advised the patient to eat a healthy diet with plenty of whole grains, cereals, fruits and vegetables, exercise regularly and maintain ideal body weight.  Recovering well from a stroke standpoint without further recommendations or needs. Recommend follow up as needed or call with any questions or concerns regarding stroke       Thank you for coming to see [place] at [facility]. I hope we have been able to provide you high quality care today.  You may receive a patient satisfaction survey over the next few weeks. We would appreciate your feedback and comments so that we may continue to improve ourselves and the health of our patients.

## 2020-02-06 NOTE — Progress Notes (Signed)
I agree with the above plan 

## 2020-02-09 DIAGNOSIS — F39 Unspecified mood [affective] disorder: Secondary | ICD-10-CM | POA: Diagnosis not present

## 2020-02-09 DIAGNOSIS — F419 Anxiety disorder, unspecified: Secondary | ICD-10-CM | POA: Diagnosis not present

## 2020-02-10 DIAGNOSIS — R252 Cramp and spasm: Secondary | ICD-10-CM | POA: Diagnosis not present

## 2020-02-12 DIAGNOSIS — Z79899 Other long term (current) drug therapy: Secondary | ICD-10-CM | POA: Diagnosis not present

## 2020-02-12 DIAGNOSIS — D649 Anemia, unspecified: Secondary | ICD-10-CM | POA: Diagnosis not present

## 2020-02-12 DIAGNOSIS — E612 Magnesium deficiency: Secondary | ICD-10-CM | POA: Diagnosis not present

## 2020-02-12 DIAGNOSIS — Z13228 Encounter for screening for other metabolic disorders: Secondary | ICD-10-CM | POA: Diagnosis not present

## 2020-02-13 DIAGNOSIS — Z1231 Encounter for screening mammogram for malignant neoplasm of breast: Secondary | ICD-10-CM | POA: Diagnosis not present

## 2020-02-17 DIAGNOSIS — G2581 Restless legs syndrome: Secondary | ICD-10-CM | POA: Diagnosis not present

## 2020-02-18 DIAGNOSIS — M1990 Unspecified osteoarthritis, unspecified site: Secondary | ICD-10-CM | POA: Diagnosis not present

## 2020-02-18 DIAGNOSIS — I10 Essential (primary) hypertension: Secondary | ICD-10-CM | POA: Diagnosis not present

## 2020-02-18 DIAGNOSIS — R2689 Other abnormalities of gait and mobility: Secondary | ICD-10-CM | POA: Diagnosis not present

## 2020-02-18 DIAGNOSIS — H3530 Unspecified macular degeneration: Secondary | ICD-10-CM | POA: Diagnosis not present

## 2020-02-18 DIAGNOSIS — I69354 Hemiplegia and hemiparesis following cerebral infarction affecting left non-dominant side: Secondary | ICD-10-CM | POA: Diagnosis not present

## 2020-02-25 DIAGNOSIS — F4322 Adjustment disorder with anxiety: Secondary | ICD-10-CM | POA: Diagnosis not present

## 2020-03-03 DIAGNOSIS — R2689 Other abnormalities of gait and mobility: Secondary | ICD-10-CM | POA: Diagnosis not present

## 2020-03-15 DIAGNOSIS — F419 Anxiety disorder, unspecified: Secondary | ICD-10-CM | POA: Diagnosis not present

## 2020-03-15 DIAGNOSIS — F39 Unspecified mood [affective] disorder: Secondary | ICD-10-CM | POA: Diagnosis not present

## 2020-03-23 DIAGNOSIS — I10 Essential (primary) hypertension: Secondary | ICD-10-CM | POA: Diagnosis not present

## 2020-03-23 DIAGNOSIS — R2689 Other abnormalities of gait and mobility: Secondary | ICD-10-CM | POA: Diagnosis not present

## 2020-03-23 DIAGNOSIS — I69354 Hemiplegia and hemiparesis following cerebral infarction affecting left non-dominant side: Secondary | ICD-10-CM | POA: Diagnosis not present

## 2020-03-23 DIAGNOSIS — M1990 Unspecified osteoarthritis, unspecified site: Secondary | ICD-10-CM | POA: Diagnosis not present

## 2020-03-23 DIAGNOSIS — H3530 Unspecified macular degeneration: Secondary | ICD-10-CM | POA: Diagnosis not present

## 2020-04-02 DIAGNOSIS — R2689 Other abnormalities of gait and mobility: Secondary | ICD-10-CM | POA: Diagnosis not present

## 2020-04-12 DIAGNOSIS — F419 Anxiety disorder, unspecified: Secondary | ICD-10-CM | POA: Diagnosis not present

## 2020-04-12 DIAGNOSIS — F39 Unspecified mood [affective] disorder: Secondary | ICD-10-CM | POA: Diagnosis not present

## 2020-04-21 DIAGNOSIS — H3530 Unspecified macular degeneration: Secondary | ICD-10-CM | POA: Diagnosis not present

## 2020-04-21 DIAGNOSIS — F4322 Adjustment disorder with anxiety: Secondary | ICD-10-CM | POA: Diagnosis not present

## 2020-04-21 DIAGNOSIS — M1990 Unspecified osteoarthritis, unspecified site: Secondary | ICD-10-CM | POA: Diagnosis not present

## 2020-04-21 DIAGNOSIS — I10 Essential (primary) hypertension: Secondary | ICD-10-CM | POA: Diagnosis not present

## 2020-04-21 DIAGNOSIS — R2689 Other abnormalities of gait and mobility: Secondary | ICD-10-CM | POA: Diagnosis not present

## 2020-04-21 DIAGNOSIS — I69354 Hemiplegia and hemiparesis following cerebral infarction affecting left non-dominant side: Secondary | ICD-10-CM | POA: Diagnosis not present

## 2020-04-23 DIAGNOSIS — H353221 Exudative age-related macular degeneration, left eye, with active choroidal neovascularization: Secondary | ICD-10-CM | POA: Diagnosis not present

## 2020-04-23 DIAGNOSIS — H353213 Exudative age-related macular degeneration, right eye, with inactive scar: Secondary | ICD-10-CM | POA: Diagnosis not present

## 2020-04-23 DIAGNOSIS — H35423 Microcystoid degeneration of retina, bilateral: Secondary | ICD-10-CM | POA: Diagnosis not present

## 2020-04-23 DIAGNOSIS — H43812 Vitreous degeneration, left eye: Secondary | ICD-10-CM | POA: Diagnosis not present

## 2020-04-29 ENCOUNTER — Ambulatory Visit: Payer: PPO | Admitting: Adult Health

## 2020-05-03 DIAGNOSIS — R2689 Other abnormalities of gait and mobility: Secondary | ICD-10-CM | POA: Diagnosis not present

## 2020-05-10 DIAGNOSIS — F39 Unspecified mood [affective] disorder: Secondary | ICD-10-CM | POA: Diagnosis not present

## 2020-05-10 DIAGNOSIS — F419 Anxiety disorder, unspecified: Secondary | ICD-10-CM | POA: Diagnosis not present

## 2020-05-11 DIAGNOSIS — I6789 Other cerebrovascular disease: Secondary | ICD-10-CM | POA: Diagnosis not present

## 2020-05-11 DIAGNOSIS — F1320 Sedative, hypnotic or anxiolytic dependence, uncomplicated: Secondary | ICD-10-CM | POA: Diagnosis not present

## 2020-05-11 DIAGNOSIS — F39 Unspecified mood [affective] disorder: Secondary | ICD-10-CM | POA: Diagnosis not present

## 2020-05-11 DIAGNOSIS — H3530 Unspecified macular degeneration: Secondary | ICD-10-CM | POA: Diagnosis not present

## 2020-05-19 DIAGNOSIS — I69354 Hemiplegia and hemiparesis following cerebral infarction affecting left non-dominant side: Secondary | ICD-10-CM | POA: Diagnosis not present

## 2020-05-19 DIAGNOSIS — R2689 Other abnormalities of gait and mobility: Secondary | ICD-10-CM | POA: Diagnosis not present

## 2020-05-19 DIAGNOSIS — H3530 Unspecified macular degeneration: Secondary | ICD-10-CM | POA: Diagnosis not present

## 2020-05-19 DIAGNOSIS — I10 Essential (primary) hypertension: Secondary | ICD-10-CM | POA: Diagnosis not present

## 2020-05-19 DIAGNOSIS — M1990 Unspecified osteoarthritis, unspecified site: Secondary | ICD-10-CM | POA: Diagnosis not present

## 2020-05-27 DIAGNOSIS — F419 Anxiety disorder, unspecified: Secondary | ICD-10-CM | POA: Diagnosis not present

## 2020-05-27 DIAGNOSIS — F39 Unspecified mood [affective] disorder: Secondary | ICD-10-CM | POA: Diagnosis not present

## 2020-06-03 DIAGNOSIS — R2689 Other abnormalities of gait and mobility: Secondary | ICD-10-CM | POA: Diagnosis not present

## 2020-06-11 DIAGNOSIS — H35423 Microcystoid degeneration of retina, bilateral: Secondary | ICD-10-CM | POA: Diagnosis not present

## 2020-06-11 DIAGNOSIS — H43812 Vitreous degeneration, left eye: Secondary | ICD-10-CM | POA: Diagnosis not present

## 2020-06-11 DIAGNOSIS — H353213 Exudative age-related macular degeneration, right eye, with inactive scar: Secondary | ICD-10-CM | POA: Diagnosis not present

## 2020-06-11 DIAGNOSIS — H353221 Exudative age-related macular degeneration, left eye, with active choroidal neovascularization: Secondary | ICD-10-CM | POA: Diagnosis not present

## 2020-06-21 DIAGNOSIS — I69354 Hemiplegia and hemiparesis following cerebral infarction affecting left non-dominant side: Secondary | ICD-10-CM | POA: Diagnosis not present

## 2020-06-21 DIAGNOSIS — R2689 Other abnormalities of gait and mobility: Secondary | ICD-10-CM | POA: Diagnosis not present

## 2020-06-21 DIAGNOSIS — I10 Essential (primary) hypertension: Secondary | ICD-10-CM | POA: Diagnosis not present

## 2020-06-21 DIAGNOSIS — H3530 Unspecified macular degeneration: Secondary | ICD-10-CM | POA: Diagnosis not present

## 2020-06-21 DIAGNOSIS — M1990 Unspecified osteoarthritis, unspecified site: Secondary | ICD-10-CM | POA: Diagnosis not present

## 2020-07-01 DIAGNOSIS — F419 Anxiety disorder, unspecified: Secondary | ICD-10-CM | POA: Diagnosis not present

## 2020-07-01 DIAGNOSIS — F39 Unspecified mood [affective] disorder: Secondary | ICD-10-CM | POA: Diagnosis not present

## 2020-07-03 DIAGNOSIS — R2689 Other abnormalities of gait and mobility: Secondary | ICD-10-CM | POA: Diagnosis not present

## 2020-07-13 DIAGNOSIS — W1789XA Other fall from one level to another, initial encounter: Secondary | ICD-10-CM | POA: Diagnosis not present

## 2020-07-13 DIAGNOSIS — R21 Rash and other nonspecific skin eruption: Secondary | ICD-10-CM | POA: Diagnosis not present

## 2020-07-21 DIAGNOSIS — H3530 Unspecified macular degeneration: Secondary | ICD-10-CM | POA: Diagnosis not present

## 2020-07-21 DIAGNOSIS — R2689 Other abnormalities of gait and mobility: Secondary | ICD-10-CM | POA: Diagnosis not present

## 2020-07-21 DIAGNOSIS — I69354 Hemiplegia and hemiparesis following cerebral infarction affecting left non-dominant side: Secondary | ICD-10-CM | POA: Diagnosis not present

## 2020-07-21 DIAGNOSIS — I10 Essential (primary) hypertension: Secondary | ICD-10-CM | POA: Diagnosis not present

## 2020-07-21 DIAGNOSIS — M1990 Unspecified osteoarthritis, unspecified site: Secondary | ICD-10-CM | POA: Diagnosis not present

## 2020-07-27 DIAGNOSIS — F1320 Sedative, hypnotic or anxiolytic dependence, uncomplicated: Secondary | ICD-10-CM | POA: Diagnosis not present

## 2020-07-27 DIAGNOSIS — R262 Difficulty in walking, not elsewhere classified: Secondary | ICD-10-CM | POA: Diagnosis not present

## 2020-07-27 DIAGNOSIS — E7849 Other hyperlipidemia: Secondary | ICD-10-CM | POA: Diagnosis not present

## 2020-07-27 DIAGNOSIS — I6789 Other cerebrovascular disease: Secondary | ICD-10-CM | POA: Diagnosis not present

## 2020-07-27 DIAGNOSIS — R252 Cramp and spasm: Secondary | ICD-10-CM | POA: Diagnosis not present

## 2020-07-28 DIAGNOSIS — H353213 Exudative age-related macular degeneration, right eye, with inactive scar: Secondary | ICD-10-CM | POA: Diagnosis not present

## 2020-07-28 DIAGNOSIS — H43812 Vitreous degeneration, left eye: Secondary | ICD-10-CM | POA: Diagnosis not present

## 2020-07-28 DIAGNOSIS — H35423 Microcystoid degeneration of retina, bilateral: Secondary | ICD-10-CM | POA: Diagnosis not present

## 2020-07-28 DIAGNOSIS — H353221 Exudative age-related macular degeneration, left eye, with active choroidal neovascularization: Secondary | ICD-10-CM | POA: Diagnosis not present

## 2020-07-29 DIAGNOSIS — F39 Unspecified mood [affective] disorder: Secondary | ICD-10-CM | POA: Diagnosis not present

## 2020-07-29 DIAGNOSIS — F419 Anxiety disorder, unspecified: Secondary | ICD-10-CM | POA: Diagnosis not present

## 2020-08-03 DIAGNOSIS — R2689 Other abnormalities of gait and mobility: Secondary | ICD-10-CM | POA: Diagnosis not present

## 2020-08-11 DIAGNOSIS — F39 Unspecified mood [affective] disorder: Secondary | ICD-10-CM | POA: Diagnosis not present

## 2020-08-11 DIAGNOSIS — F419 Anxiety disorder, unspecified: Secondary | ICD-10-CM | POA: Diagnosis not present

## 2020-08-23 DIAGNOSIS — R2689 Other abnormalities of gait and mobility: Secondary | ICD-10-CM | POA: Diagnosis not present

## 2020-08-23 DIAGNOSIS — H3530 Unspecified macular degeneration: Secondary | ICD-10-CM | POA: Diagnosis not present

## 2020-08-23 DIAGNOSIS — M1990 Unspecified osteoarthritis, unspecified site: Secondary | ICD-10-CM | POA: Diagnosis not present

## 2020-08-23 DIAGNOSIS — I10 Essential (primary) hypertension: Secondary | ICD-10-CM | POA: Diagnosis not present

## 2020-08-23 DIAGNOSIS — I69354 Hemiplegia and hemiparesis following cerebral infarction affecting left non-dominant side: Secondary | ICD-10-CM | POA: Diagnosis not present

## 2020-09-02 DIAGNOSIS — R2689 Other abnormalities of gait and mobility: Secondary | ICD-10-CM | POA: Diagnosis not present

## 2020-09-09 DIAGNOSIS — F39 Unspecified mood [affective] disorder: Secondary | ICD-10-CM | POA: Diagnosis not present

## 2020-09-09 DIAGNOSIS — F419 Anxiety disorder, unspecified: Secondary | ICD-10-CM | POA: Diagnosis not present

## 2020-09-13 DIAGNOSIS — F39 Unspecified mood [affective] disorder: Secondary | ICD-10-CM | POA: Diagnosis not present

## 2020-09-13 DIAGNOSIS — F419 Anxiety disorder, unspecified: Secondary | ICD-10-CM | POA: Diagnosis not present

## 2020-09-21 DIAGNOSIS — R2689 Other abnormalities of gait and mobility: Secondary | ICD-10-CM | POA: Diagnosis not present

## 2020-09-21 DIAGNOSIS — I69354 Hemiplegia and hemiparesis following cerebral infarction affecting left non-dominant side: Secondary | ICD-10-CM | POA: Diagnosis not present

## 2020-09-21 DIAGNOSIS — F419 Anxiety disorder, unspecified: Secondary | ICD-10-CM | POA: Diagnosis not present

## 2020-09-21 DIAGNOSIS — F39 Unspecified mood [affective] disorder: Secondary | ICD-10-CM | POA: Diagnosis not present

## 2020-09-21 DIAGNOSIS — H3530 Unspecified macular degeneration: Secondary | ICD-10-CM | POA: Diagnosis not present

## 2020-09-21 DIAGNOSIS — M1990 Unspecified osteoarthritis, unspecified site: Secondary | ICD-10-CM | POA: Diagnosis not present

## 2020-09-22 DIAGNOSIS — H353213 Exudative age-related macular degeneration, right eye, with inactive scar: Secondary | ICD-10-CM | POA: Diagnosis not present

## 2020-09-22 DIAGNOSIS — H43812 Vitreous degeneration, left eye: Secondary | ICD-10-CM | POA: Diagnosis not present

## 2020-09-22 DIAGNOSIS — H35423 Microcystoid degeneration of retina, bilateral: Secondary | ICD-10-CM | POA: Diagnosis not present

## 2020-09-22 DIAGNOSIS — H353221 Exudative age-related macular degeneration, left eye, with active choroidal neovascularization: Secondary | ICD-10-CM | POA: Diagnosis not present

## 2020-09-30 DIAGNOSIS — F39 Unspecified mood [affective] disorder: Secondary | ICD-10-CM | POA: Diagnosis not present

## 2020-09-30 DIAGNOSIS — F419 Anxiety disorder, unspecified: Secondary | ICD-10-CM | POA: Diagnosis not present

## 2020-10-12 DIAGNOSIS — R0689 Other abnormalities of breathing: Secondary | ICD-10-CM | POA: Diagnosis not present

## 2020-10-12 DIAGNOSIS — J309 Allergic rhinitis, unspecified: Secondary | ICD-10-CM | POA: Diagnosis not present

## 2020-10-13 DIAGNOSIS — R0989 Other specified symptoms and signs involving the circulatory and respiratory systems: Secondary | ICD-10-CM | POA: Diagnosis not present

## 2020-10-19 DIAGNOSIS — R300 Dysuria: Secondary | ICD-10-CM | POA: Diagnosis not present

## 2020-10-19 DIAGNOSIS — R0989 Other specified symptoms and signs involving the circulatory and respiratory systems: Secondary | ICD-10-CM | POA: Diagnosis not present

## 2020-10-20 DIAGNOSIS — Z03818 Encounter for observation for suspected exposure to other biological agents ruled out: Secondary | ICD-10-CM | POA: Diagnosis not present

## 2020-10-22 DIAGNOSIS — H3530 Unspecified macular degeneration: Secondary | ICD-10-CM | POA: Diagnosis not present

## 2020-10-22 DIAGNOSIS — R269 Unspecified abnormalities of gait and mobility: Secondary | ICD-10-CM | POA: Diagnosis not present

## 2020-10-22 DIAGNOSIS — F1320 Sedative, hypnotic or anxiolytic dependence, uncomplicated: Secondary | ICD-10-CM | POA: Diagnosis not present

## 2020-10-22 DIAGNOSIS — I679 Cerebrovascular disease, unspecified: Secondary | ICD-10-CM | POA: Diagnosis not present

## 2020-10-27 DIAGNOSIS — Z20828 Contact with and (suspected) exposure to other viral communicable diseases: Secondary | ICD-10-CM | POA: Diagnosis not present

## 2020-11-01 DIAGNOSIS — F419 Anxiety disorder, unspecified: Secondary | ICD-10-CM | POA: Diagnosis not present

## 2020-11-01 DIAGNOSIS — F39 Unspecified mood [affective] disorder: Secondary | ICD-10-CM | POA: Diagnosis not present

## 2020-11-02 DIAGNOSIS — I509 Heart failure, unspecified: Secondary | ICD-10-CM | POA: Diagnosis not present

## 2020-11-02 DIAGNOSIS — D72828 Other elevated white blood cell count: Secondary | ICD-10-CM | POA: Diagnosis not present

## 2020-11-02 DIAGNOSIS — R7989 Other specified abnormal findings of blood chemistry: Secondary | ICD-10-CM | POA: Diagnosis not present

## 2020-11-02 DIAGNOSIS — R062 Wheezing: Secondary | ICD-10-CM | POA: Diagnosis not present

## 2020-11-02 DIAGNOSIS — I10 Essential (primary) hypertension: Secondary | ICD-10-CM | POA: Diagnosis not present

## 2020-11-03 DIAGNOSIS — I509 Heart failure, unspecified: Secondary | ICD-10-CM | POA: Diagnosis not present

## 2020-11-03 DIAGNOSIS — R062 Wheezing: Secondary | ICD-10-CM | POA: Diagnosis not present

## 2020-11-03 DIAGNOSIS — I10 Essential (primary) hypertension: Secondary | ICD-10-CM | POA: Diagnosis not present

## 2020-11-03 DIAGNOSIS — Z03818 Encounter for observation for suspected exposure to other biological agents ruled out: Secondary | ICD-10-CM | POA: Diagnosis not present

## 2020-11-04 DIAGNOSIS — N390 Urinary tract infection, site not specified: Secondary | ICD-10-CM | POA: Diagnosis not present

## 2020-11-09 DIAGNOSIS — N390 Urinary tract infection, site not specified: Secondary | ICD-10-CM | POA: Diagnosis not present

## 2020-11-09 DIAGNOSIS — R0989 Other specified symptoms and signs involving the circulatory and respiratory systems: Secondary | ICD-10-CM | POA: Diagnosis not present

## 2020-11-09 DIAGNOSIS — D72828 Other elevated white blood cell count: Secondary | ICD-10-CM | POA: Diagnosis not present

## 2020-11-11 DIAGNOSIS — D649 Anemia, unspecified: Secondary | ICD-10-CM | POA: Diagnosis not present

## 2020-11-12 DIAGNOSIS — D6949 Other primary thrombocytopenia: Secondary | ICD-10-CM | POA: Diagnosis not present

## 2020-11-12 DIAGNOSIS — C9510 Chronic leukemia of unspecified cell type not having achieved remission: Secondary | ICD-10-CM | POA: Diagnosis not present

## 2020-11-12 DIAGNOSIS — F1320 Sedative, hypnotic or anxiolytic dependence, uncomplicated: Secondary | ICD-10-CM | POA: Diagnosis not present

## 2020-11-12 DIAGNOSIS — R7989 Other specified abnormal findings of blood chemistry: Secondary | ICD-10-CM | POA: Diagnosis not present

## 2020-11-17 DIAGNOSIS — H35423 Microcystoid degeneration of retina, bilateral: Secondary | ICD-10-CM | POA: Diagnosis not present

## 2020-11-17 DIAGNOSIS — H353221 Exudative age-related macular degeneration, left eye, with active choroidal neovascularization: Secondary | ICD-10-CM | POA: Diagnosis not present

## 2020-11-17 DIAGNOSIS — H43812 Vitreous degeneration, left eye: Secondary | ICD-10-CM | POA: Diagnosis not present

## 2020-11-17 DIAGNOSIS — H353213 Exudative age-related macular degeneration, right eye, with inactive scar: Secondary | ICD-10-CM | POA: Diagnosis not present

## 2020-11-24 DIAGNOSIS — D649 Anemia, unspecified: Secondary | ICD-10-CM | POA: Diagnosis not present

## 2020-11-25 DIAGNOSIS — F419 Anxiety disorder, unspecified: Secondary | ICD-10-CM | POA: Diagnosis not present

## 2020-11-25 DIAGNOSIS — F39 Unspecified mood [affective] disorder: Secondary | ICD-10-CM | POA: Diagnosis not present

## 2020-11-30 DIAGNOSIS — R140 Abdominal distension (gaseous): Secondary | ICD-10-CM | POA: Diagnosis not present

## 2020-11-30 DIAGNOSIS — K3184 Gastroparesis: Secondary | ICD-10-CM | POA: Diagnosis not present

## 2020-11-30 DIAGNOSIS — C9510 Chronic leukemia of unspecified cell type not having achieved remission: Secondary | ICD-10-CM | POA: Diagnosis not present

## 2020-12-01 DIAGNOSIS — I69354 Hemiplegia and hemiparesis following cerebral infarction affecting left non-dominant side: Secondary | ICD-10-CM | POA: Diagnosis not present

## 2020-12-01 DIAGNOSIS — M79601 Pain in right arm: Secondary | ICD-10-CM | POA: Diagnosis not present

## 2020-12-01 DIAGNOSIS — M6281 Muscle weakness (generalized): Secondary | ICD-10-CM | POA: Diagnosis not present

## 2020-12-01 DIAGNOSIS — R109 Unspecified abdominal pain: Secondary | ICD-10-CM | POA: Diagnosis not present

## 2020-12-01 DIAGNOSIS — M150 Primary generalized (osteo)arthritis: Secondary | ICD-10-CM | POA: Diagnosis not present

## 2020-12-02 DIAGNOSIS — F419 Anxiety disorder, unspecified: Secondary | ICD-10-CM | POA: Diagnosis not present

## 2020-12-02 DIAGNOSIS — F39 Unspecified mood [affective] disorder: Secondary | ICD-10-CM | POA: Diagnosis not present

## 2020-12-14 DIAGNOSIS — K5909 Other constipation: Secondary | ICD-10-CM | POA: Diagnosis not present

## 2020-12-21 DIAGNOSIS — M6281 Muscle weakness (generalized): Secondary | ICD-10-CM | POA: Diagnosis not present

## 2020-12-21 DIAGNOSIS — M150 Primary generalized (osteo)arthritis: Secondary | ICD-10-CM | POA: Diagnosis not present

## 2020-12-21 DIAGNOSIS — L308 Other specified dermatitis: Secondary | ICD-10-CM | POA: Diagnosis not present

## 2020-12-21 DIAGNOSIS — M79601 Pain in right arm: Secondary | ICD-10-CM | POA: Diagnosis not present

## 2020-12-21 DIAGNOSIS — I69354 Hemiplegia and hemiparesis following cerebral infarction affecting left non-dominant side: Secondary | ICD-10-CM | POA: Diagnosis not present

## 2020-12-22 DIAGNOSIS — F39 Unspecified mood [affective] disorder: Secondary | ICD-10-CM | POA: Diagnosis not present

## 2020-12-22 DIAGNOSIS — F419 Anxiety disorder, unspecified: Secondary | ICD-10-CM | POA: Diagnosis not present

## 2020-12-23 DIAGNOSIS — F419 Anxiety disorder, unspecified: Secondary | ICD-10-CM | POA: Diagnosis not present

## 2020-12-23 DIAGNOSIS — F39 Unspecified mood [affective] disorder: Secondary | ICD-10-CM | POA: Diagnosis not present

## 2021-01-11 DIAGNOSIS — R300 Dysuria: Secondary | ICD-10-CM | POA: Diagnosis not present

## 2021-01-12 DIAGNOSIS — R300 Dysuria: Secondary | ICD-10-CM | POA: Diagnosis not present

## 2021-01-18 DIAGNOSIS — M150 Primary generalized (osteo)arthritis: Secondary | ICD-10-CM | POA: Diagnosis not present

## 2021-01-18 DIAGNOSIS — M6281 Muscle weakness (generalized): Secondary | ICD-10-CM | POA: Diagnosis not present

## 2021-01-18 DIAGNOSIS — M79601 Pain in right arm: Secondary | ICD-10-CM | POA: Diagnosis not present

## 2021-01-18 DIAGNOSIS — R262 Difficulty in walking, not elsewhere classified: Secondary | ICD-10-CM | POA: Diagnosis not present

## 2021-01-18 DIAGNOSIS — D649 Anemia, unspecified: Secondary | ICD-10-CM | POA: Diagnosis not present

## 2021-01-18 DIAGNOSIS — I69354 Hemiplegia and hemiparesis following cerebral infarction affecting left non-dominant side: Secondary | ICD-10-CM | POA: Diagnosis not present

## 2021-01-18 DIAGNOSIS — E119 Type 2 diabetes mellitus without complications: Secondary | ICD-10-CM | POA: Diagnosis not present

## 2021-01-18 DIAGNOSIS — R278 Other lack of coordination: Secondary | ICD-10-CM | POA: Diagnosis not present

## 2021-01-19 DIAGNOSIS — H353221 Exudative age-related macular degeneration, left eye, with active choroidal neovascularization: Secondary | ICD-10-CM | POA: Diagnosis not present

## 2021-01-19 DIAGNOSIS — H43812 Vitreous degeneration, left eye: Secondary | ICD-10-CM | POA: Diagnosis not present

## 2021-01-19 DIAGNOSIS — H353213 Exudative age-related macular degeneration, right eye, with inactive scar: Secondary | ICD-10-CM | POA: Diagnosis not present

## 2021-01-19 DIAGNOSIS — H35423 Microcystoid degeneration of retina, bilateral: Secondary | ICD-10-CM | POA: Diagnosis not present

## 2021-01-20 DIAGNOSIS — F419 Anxiety disorder, unspecified: Secondary | ICD-10-CM | POA: Diagnosis not present

## 2021-01-20 DIAGNOSIS — F39 Unspecified mood [affective] disorder: Secondary | ICD-10-CM | POA: Diagnosis not present

## 2021-02-04 DIAGNOSIS — F39 Unspecified mood [affective] disorder: Secondary | ICD-10-CM | POA: Diagnosis not present

## 2021-02-04 DIAGNOSIS — F419 Anxiety disorder, unspecified: Secondary | ICD-10-CM | POA: Diagnosis not present

## 2021-02-08 DIAGNOSIS — G2581 Restless legs syndrome: Secondary | ICD-10-CM | POA: Diagnosis not present

## 2021-02-08 DIAGNOSIS — M1388 Other specified arthritis, other site: Secondary | ICD-10-CM | POA: Diagnosis not present

## 2021-02-16 DIAGNOSIS — M150 Primary generalized (osteo)arthritis: Secondary | ICD-10-CM | POA: Diagnosis not present

## 2021-02-16 DIAGNOSIS — M6281 Muscle weakness (generalized): Secondary | ICD-10-CM | POA: Diagnosis not present

## 2021-02-16 DIAGNOSIS — I69354 Hemiplegia and hemiparesis following cerebral infarction affecting left non-dominant side: Secondary | ICD-10-CM | POA: Diagnosis not present

## 2021-02-16 DIAGNOSIS — M79601 Pain in right arm: Secondary | ICD-10-CM | POA: Diagnosis not present

## 2021-02-16 DIAGNOSIS — R278 Other lack of coordination: Secondary | ICD-10-CM | POA: Diagnosis not present

## 2021-02-16 DIAGNOSIS — R262 Difficulty in walking, not elsewhere classified: Secondary | ICD-10-CM | POA: Diagnosis not present

## 2021-02-17 DIAGNOSIS — F39 Unspecified mood [affective] disorder: Secondary | ICD-10-CM | POA: Diagnosis not present

## 2021-02-17 DIAGNOSIS — F419 Anxiety disorder, unspecified: Secondary | ICD-10-CM | POA: Diagnosis not present

## 2021-02-21 IMAGING — CT CT ANGIO NECK
1 of 7 series · 14 of 46 positions shown, 18 images · IV contrast (OMNI)
Comparison: None.

CLINICAL DATA: Dizziness and syncope. Head trauma.

EXAM:
CT ANGIOGRAPHY HEAD AND NECK
TECHNIQUE: Multidetector CT imaging of the head and neck was performed using
the standard protocol during bolus administration of intravenous
contrast. Multiplanar CT image reconstructions and MIPs were
obtained to evaluate the vascular anatomy. Carotid stenosis
measurements (when applicable) are obtained utilizing NASCET
criteria, using the distal internal carotid diameter as the
denominator.
CONTRAST:  75mL OMNIPAQUE IOHEXOL 350 MG/ML SOLN

[Series 12: thin · axial · 0.42mm/px · z∈[-255,+37]mm · 14 of 633 slices shown, 18 images]
[im 28/633  soft-tissue]
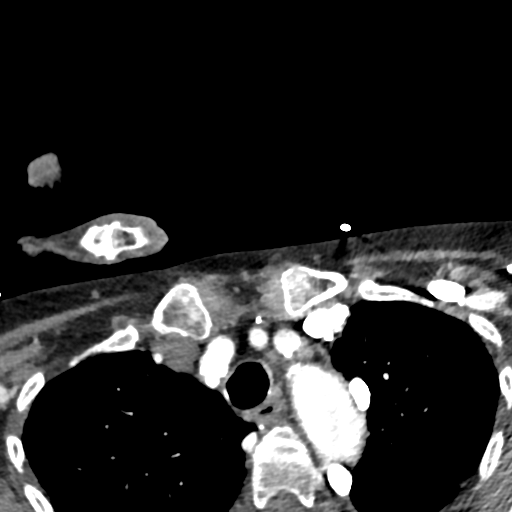
[im 28/633  bone]
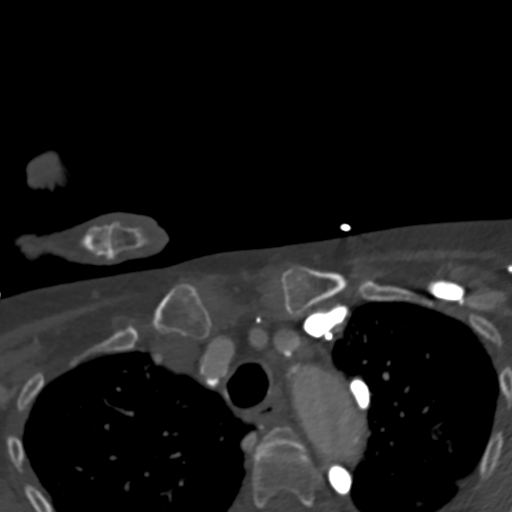
[im 83/633  soft-tissue]
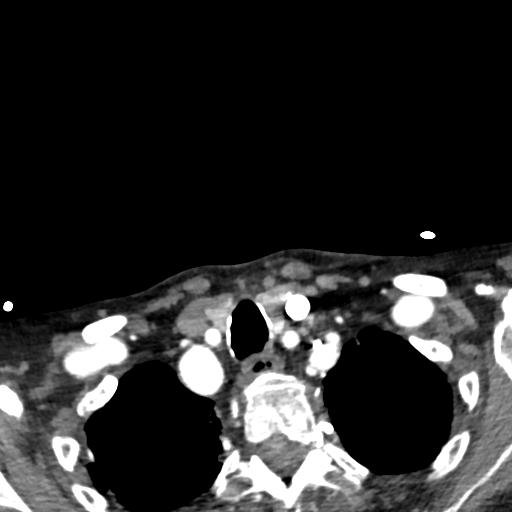
[im 138/633  soft-tissue]
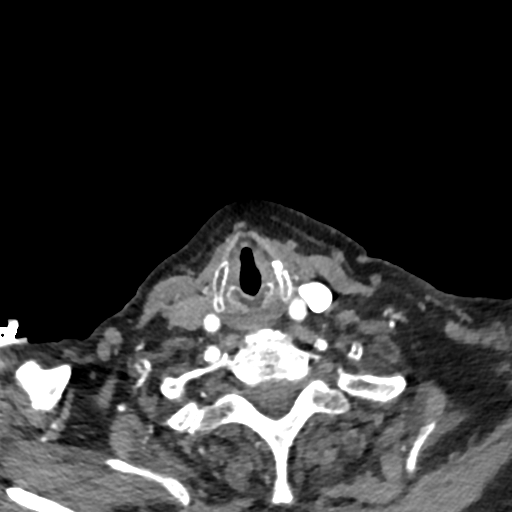
[im 193/633  soft-tissue]
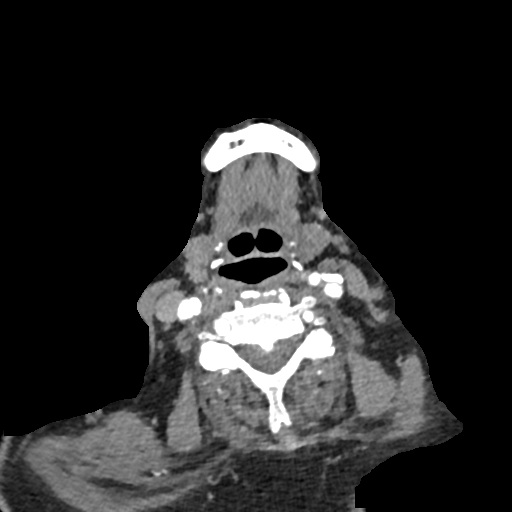
[im 248/633  soft-tissue]
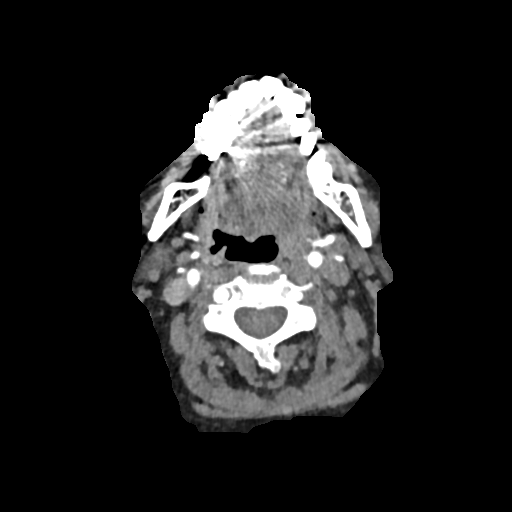
[im 303/633  soft-tissue]
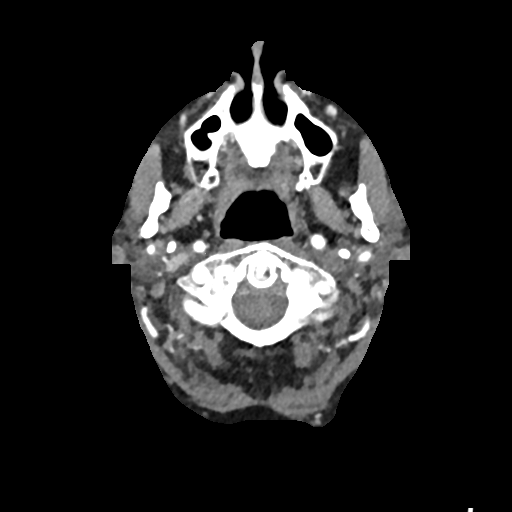
[im 330/633  soft-tissue]
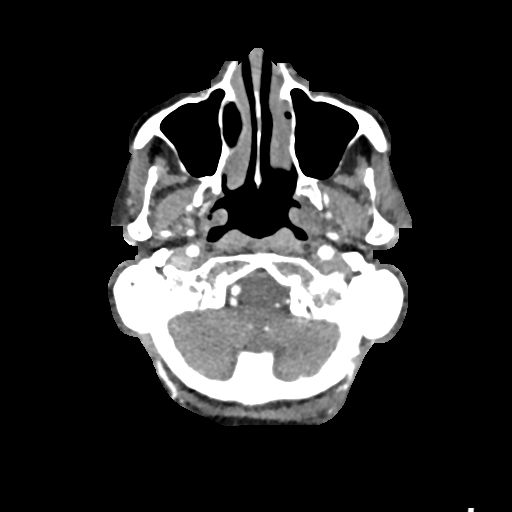
[im 385/633  soft-tissue]
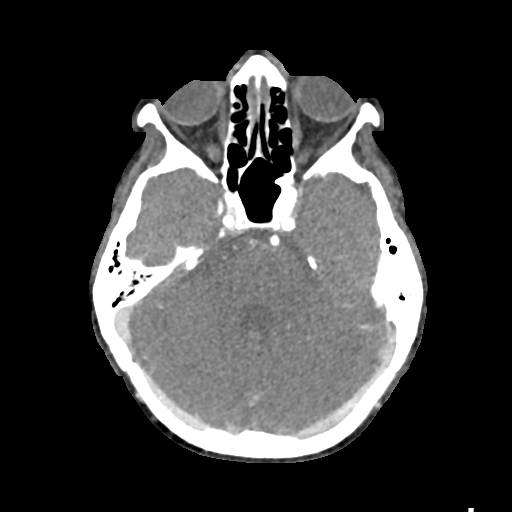
[im 440/633  soft-tissue]
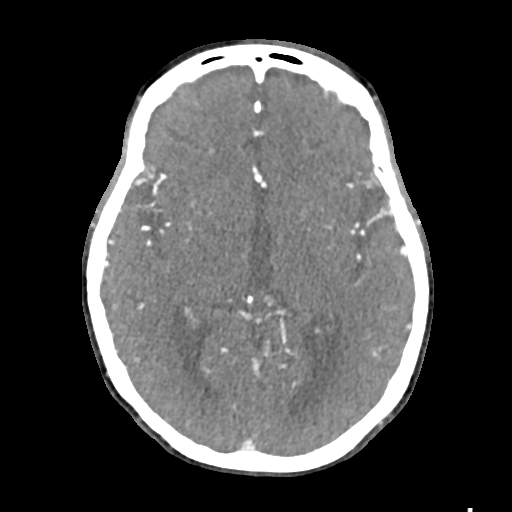
[im 440/633  bone]
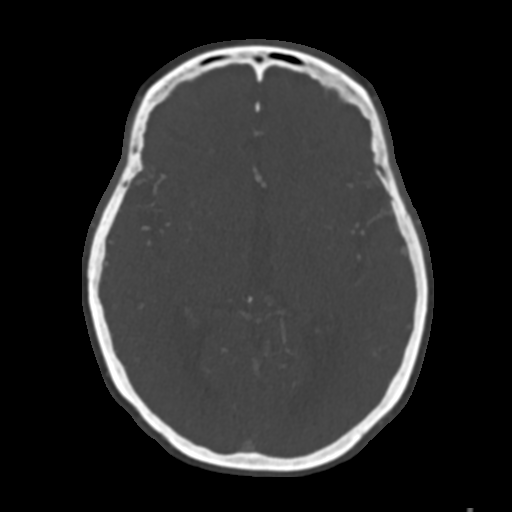
[im 495/633  soft-tissue]
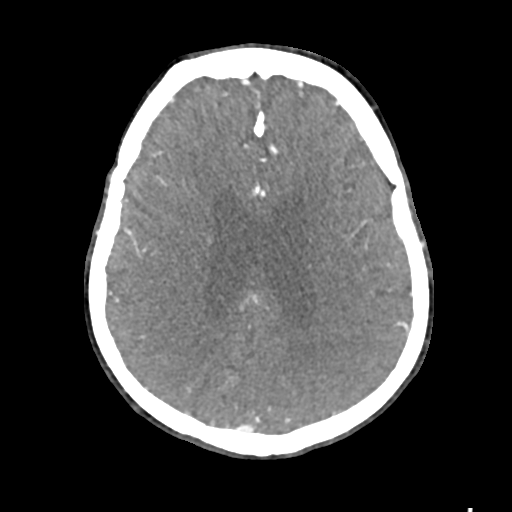
[im 523/633  lung]
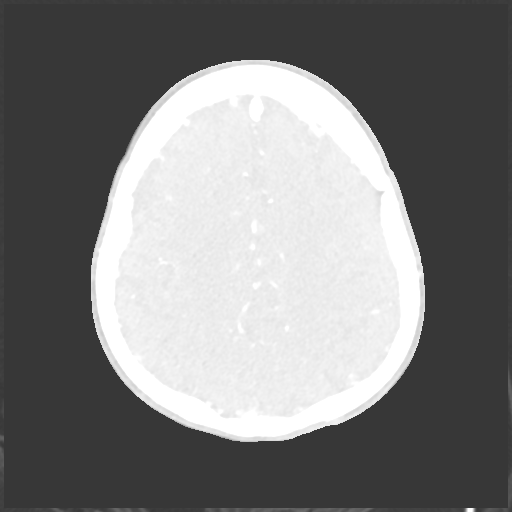
[im 550/633  soft-tissue]
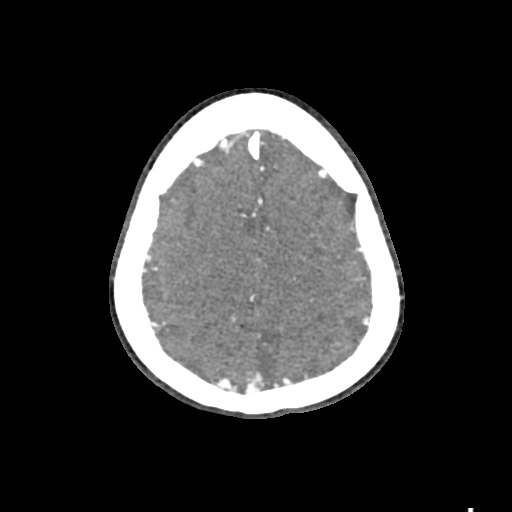
[im 550/633  lung]
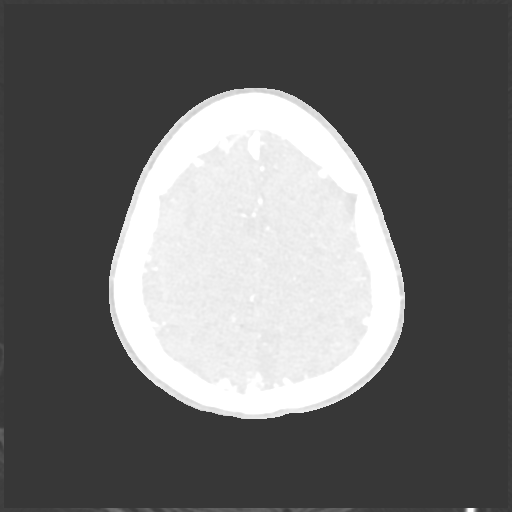
[im 578/633  lung]
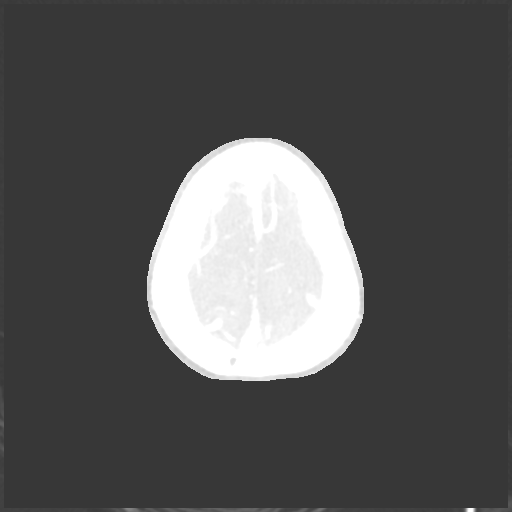
[im 605/633  soft-tissue]
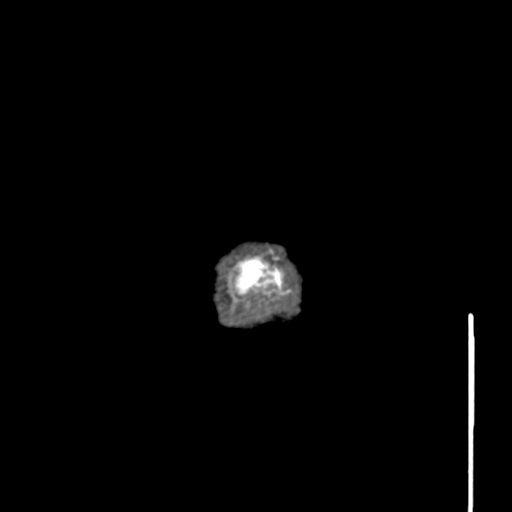
[im 605/633  lung]
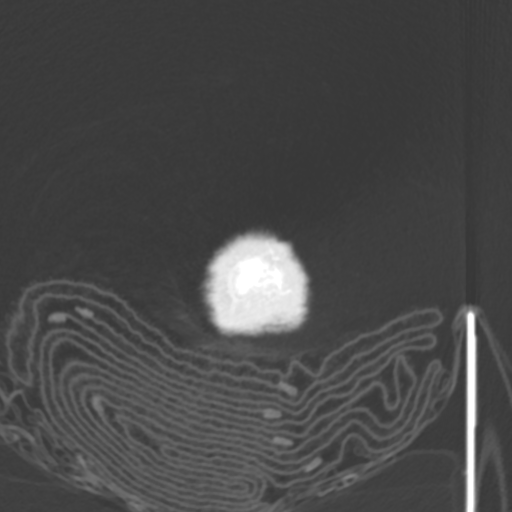

[14 of 46 positions shown; findings below may reference images not displayed]

FINDINGS: CTA NECK FINDINGS

SKELETON: There is no bony spinal canal stenosis. No lytic or
blastic lesion.

OTHER NECK: Normal pharynx, larynx and major salivary glands. No
cervical lymphadenopathy. Unremarkable thyroid gland.

UPPER CHEST: No pneumothorax or pleural effusion. No nodules or
masses.

AORTIC ARCH:

There is mild calcific atherosclerosis of the aortic arch. There is
no aneurysm, dissection or hemodynamically significant stenosis of
the visualized portion of the aorta. Conventional 3 vessel aortic
branching pattern. The visualized proximal subclavian arteries are
widely patent.

RIGHT CAROTID SYSTEM: No dissection, occlusion or aneurysm. There is
mixed density atherosclerosis extending into the proximal ICA,
resulting in less than 50% stenosis.

LEFT CAROTID SYSTEM: Normal without aneurysm, dissection or
stenosis.

VERTEBRAL ARTERIES: Right dominant configuration. Both origins are
clearly patent. There is no dissection, occlusion or flow-limiting
stenosis to the skull base (V1-V3 segments).

CTA HEAD FINDINGS

POSTERIOR CIRCULATION:

--Vertebral arteries: Normal V4 segments.

--Posterior inferior cerebellar arteries (PICA): Patent origins from
the vertebral arteries.

--Anterior inferior cerebellar arteries (AICA): Patent origins from
the basilar artery.

--Basilar artery: Normal.

--Superior cerebellar arteries: Normal.

--Posterior cerebral arteries: Normal. Both originate from the
basilar artery. Posterior communicating arteries (p-comm) are
diminutive or absent.

ANTERIOR CIRCULATION:

--Intracranial internal carotid arteries: Normal.

--Anterior cerebral arteries (ACA): Normal. Both A1 segments are
present. Patent anterior communicating artery (a-comm).

--Middle cerebral arteries (MCA): Normal.

VENOUS SINUSES: As permitted by contrast timing, patent.

ANATOMIC VARIANTS: None

Review of the MIP images confirms the above findings.
IMPRESSION: 1. No emergent large vessel occlusion or high-grade stenosis of the
intracranial arteries.
2. No dissection, aneurysm or hemodynamically significant stenosis
of the carotid or vertebral arteries.
3. Aortic Atherosclerosis (NACR5-KC6.6).

## 2021-02-21 IMAGING — CT CT HEAD W/O CM
4 series · 16 of 47 positions shown, 18 images · non-contrast
Comparison: None.

CLINICAL DATA: Status post fall with a blow to the head. Initial
encounter.

EXAM:
CT HEAD WITHOUT CONTRAST
CT MAXILLOFACIAL WITHOUT CONTRAST
TECHNIQUE: Multidetector CT imaging of the head and maxillofacial structures
were performed using the standard protocol without intravenous
contrast. Multiplanar CT image reconstructions of the maxillofacial
structures were also generated.

[Series 3: head without · axial · non-contrast · 0.42mm/px · z∈[-71,+49]mm · 7 of 34 slices shown, 9 images]
[im 5/34  brain]
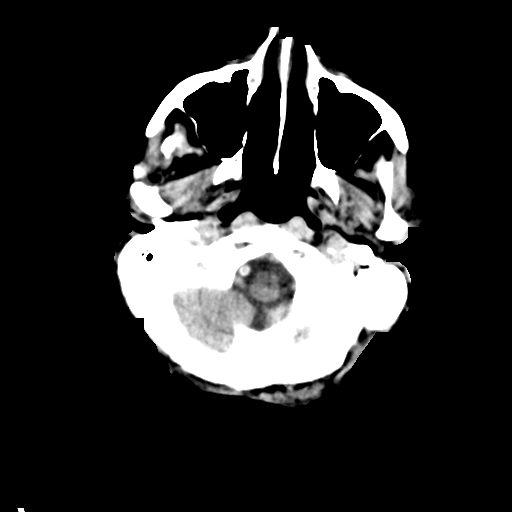
[im 5/34  bone]
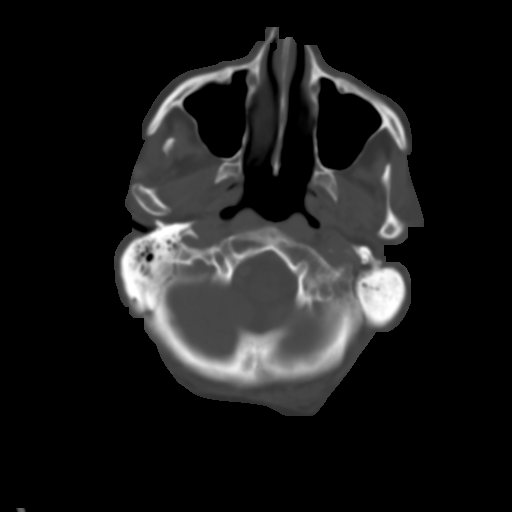
[im 9/34  brain]
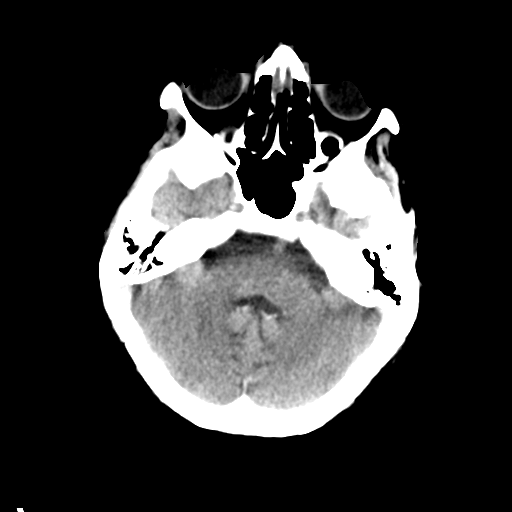
[im 13/34  brain]
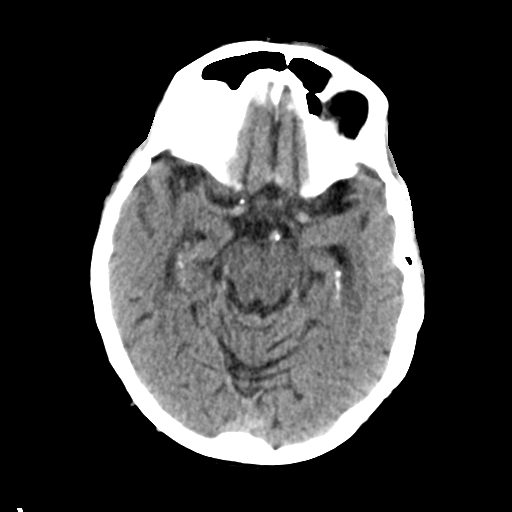
[im 17/34  brain]
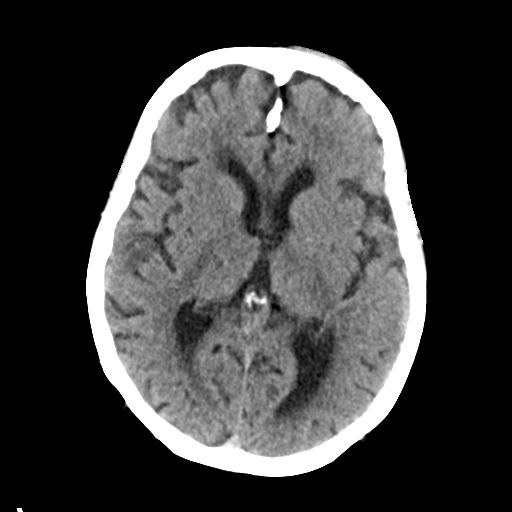
[im 21/34  brain]
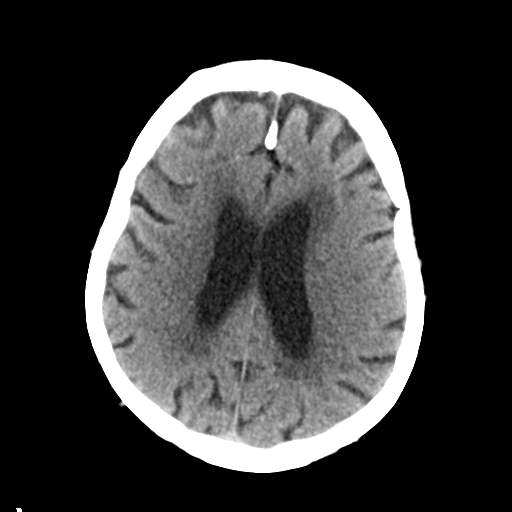
[im 21/34  bone]
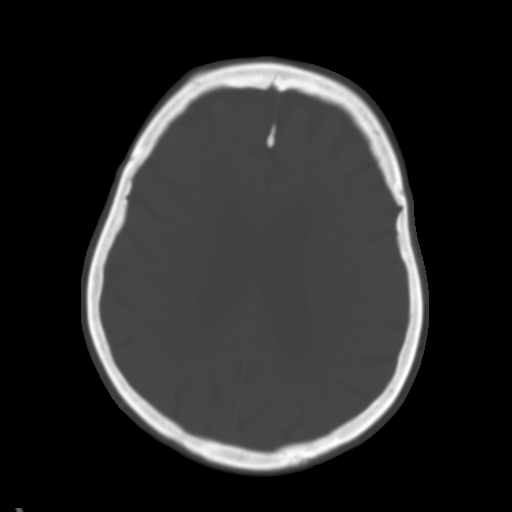
[im 25/34  brain]
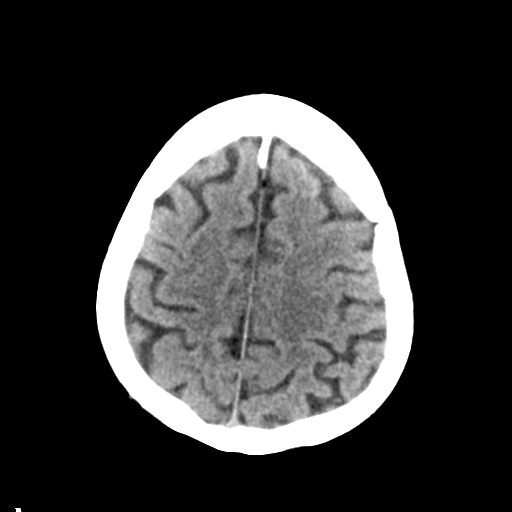
[im 29/34  brain]
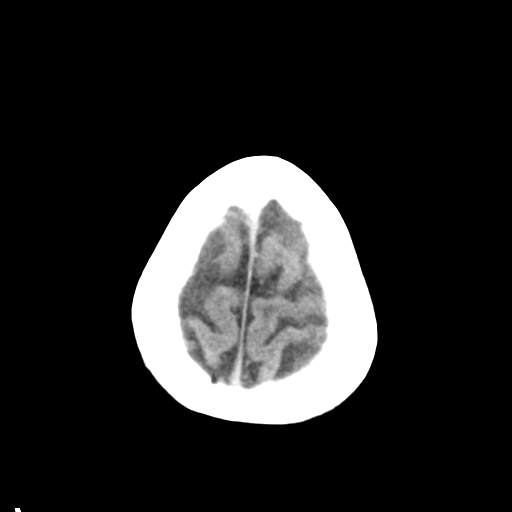

[Series 4: head bone · axial · 0.42mm/px · z∈[-75,-43]mm · 3 of 84 slices shown]
[im 9/84  bone]
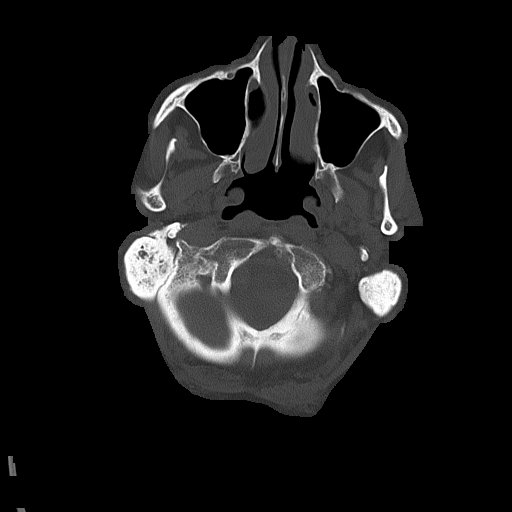
[im 17/84  bone]
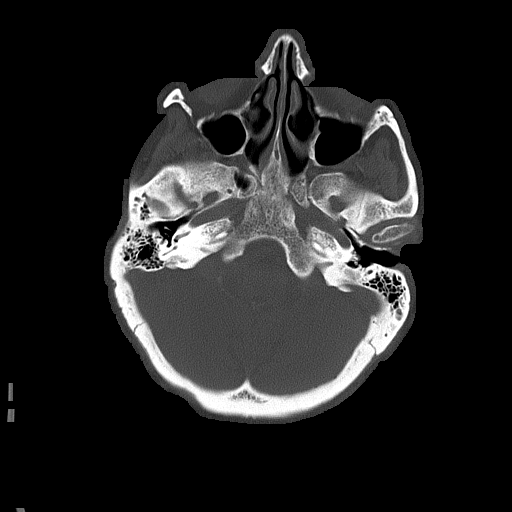
[im 25/84  bone]
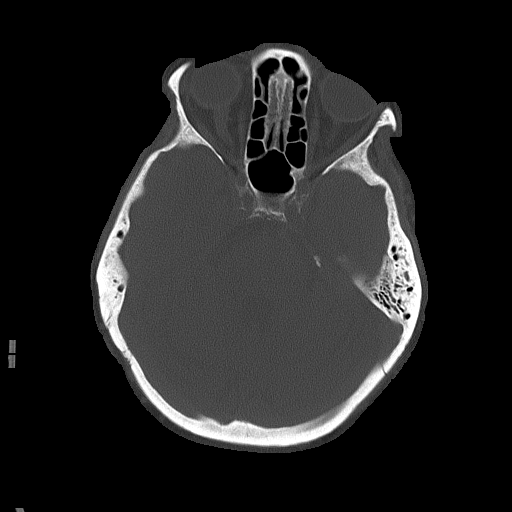

[Series 5: head without cor · coronal · non-contrast · 0.32mm/px · 3 of 67 slices shown]
[im 23/67  brain]
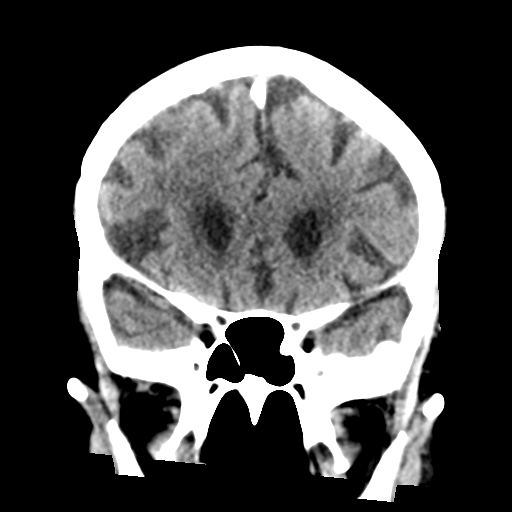
[im 30/67  brain]
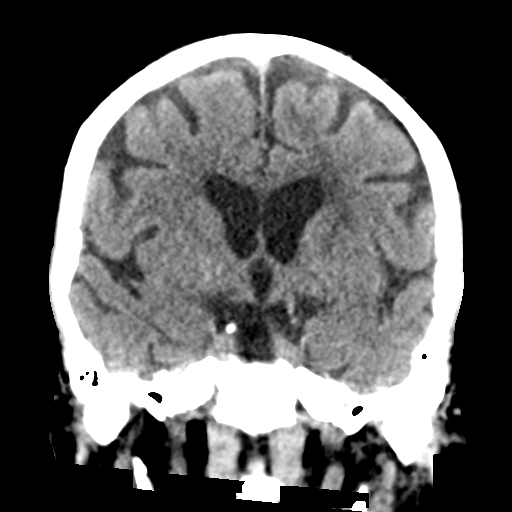
[im 37/67  brain]
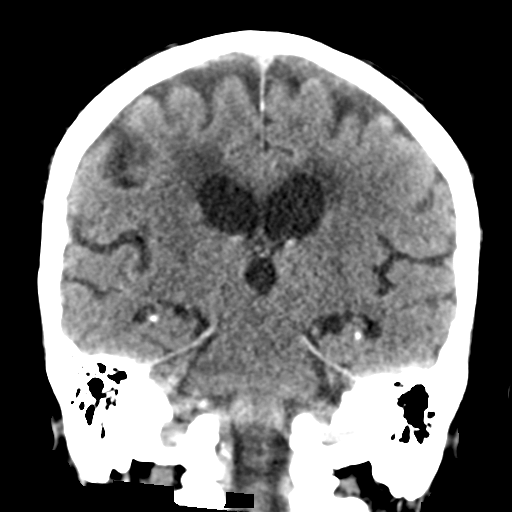

[Series 6: head without sag · sagittal · non-contrast · 0.32mm/px · 3 of 52 slices shown]
[im 18/52  brain]
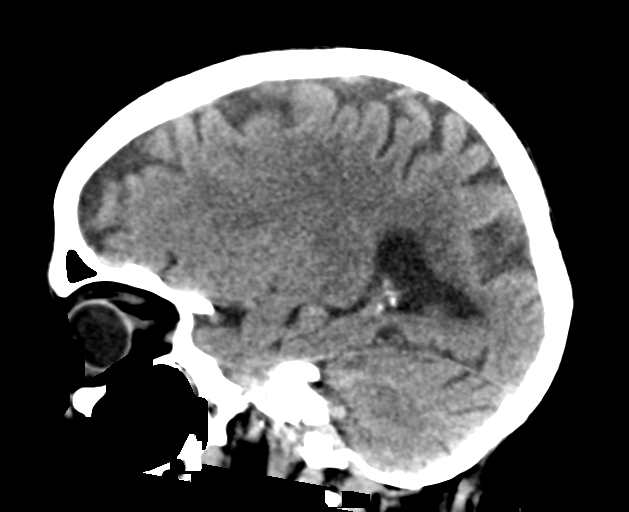
[im 26/52  brain]
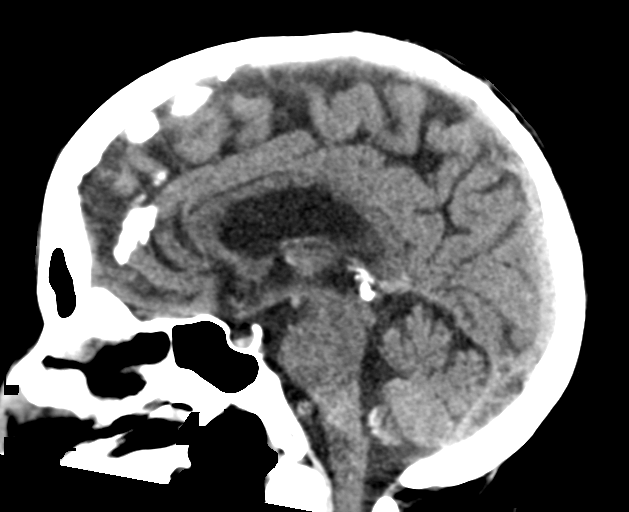
[im 35/52  brain]
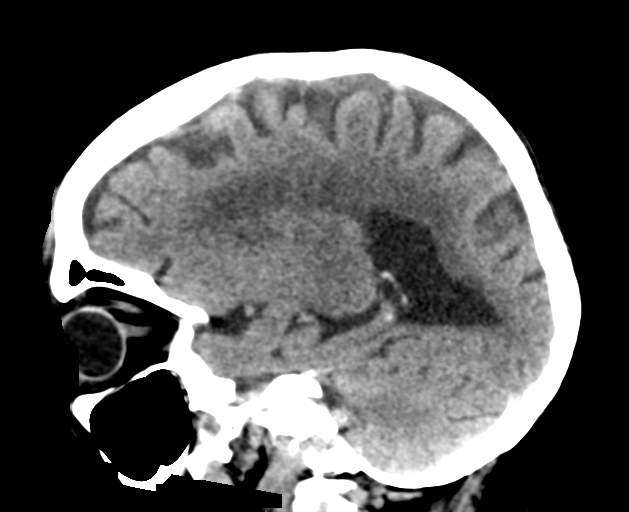

[16 of 47 positions shown; findings below may reference images not displayed]

FINDINGS: CT HEAD FINDINGS

Brain: No evidence of acute infarction, hemorrhage, hydrocephalus,
extra-axial collection or mass lesion/mass effect. Chronic
microvascular ischemic change noted.

Vascular: No hyperdense vessel or unexpected calcification.

Skull: Intact.  No focal lesion.

Other: Soft tissue contusion on the forehead is seen.

CT MAXILLOFACIAL FINDINGS

Osseous: No fracture or mandibular dislocation. No destructive
process.

Orbits: Negative. No traumatic or inflammatory finding. Status post
cataract surgery.

Sinuses: Tiny mucous retention cyst or polyp right maxillary sinus
is noted. Trace bilateral mastoid effusions.

Soft tissues: Negative.
IMPRESSION: Soft tissue contusion on the forehead without underlying fracture.
No other acute abnormality head or face.

Chronic microvascular ischemic change.

## 2021-02-22 DIAGNOSIS — Z03818 Encounter for observation for suspected exposure to other biological agents ruled out: Secondary | ICD-10-CM | POA: Diagnosis not present

## 2021-02-22 DIAGNOSIS — F330 Major depressive disorder, recurrent, mild: Secondary | ICD-10-CM | POA: Diagnosis not present

## 2021-02-22 DIAGNOSIS — F419 Anxiety disorder, unspecified: Secondary | ICD-10-CM | POA: Diagnosis not present

## 2021-03-08 DIAGNOSIS — N3281 Overactive bladder: Secondary | ICD-10-CM | POA: Diagnosis not present

## 2021-03-08 DIAGNOSIS — B372 Candidiasis of skin and nail: Secondary | ICD-10-CM | POA: Diagnosis not present

## 2021-03-15 DIAGNOSIS — R946 Abnormal results of thyroid function studies: Secondary | ICD-10-CM | POA: Diagnosis not present

## 2021-03-17 DIAGNOSIS — F39 Unspecified mood [affective] disorder: Secondary | ICD-10-CM | POA: Diagnosis not present

## 2021-03-17 DIAGNOSIS — F419 Anxiety disorder, unspecified: Secondary | ICD-10-CM | POA: Diagnosis not present

## 2021-03-18 DIAGNOSIS — R262 Difficulty in walking, not elsewhere classified: Secondary | ICD-10-CM | POA: Diagnosis not present

## 2021-03-18 DIAGNOSIS — M6281 Muscle weakness (generalized): Secondary | ICD-10-CM | POA: Diagnosis not present

## 2021-03-18 DIAGNOSIS — R278 Other lack of coordination: Secondary | ICD-10-CM | POA: Diagnosis not present

## 2021-03-18 DIAGNOSIS — M150 Primary generalized (osteo)arthritis: Secondary | ICD-10-CM | POA: Diagnosis not present

## 2021-03-18 DIAGNOSIS — I69854 Hemiplegia and hemiparesis following other cerebrovascular disease affecting left non-dominant side: Secondary | ICD-10-CM | POA: Diagnosis not present

## 2021-03-28 DIAGNOSIS — F419 Anxiety disorder, unspecified: Secondary | ICD-10-CM | POA: Diagnosis not present

## 2021-03-28 DIAGNOSIS — F39 Unspecified mood [affective] disorder: Secondary | ICD-10-CM | POA: Diagnosis not present

## 2021-03-29 DIAGNOSIS — F3341 Major depressive disorder, recurrent, in partial remission: Secondary | ICD-10-CM | POA: Diagnosis not present

## 2021-03-29 DIAGNOSIS — L989 Disorder of the skin and subcutaneous tissue, unspecified: Secondary | ICD-10-CM | POA: Diagnosis not present

## 2021-03-31 DIAGNOSIS — F419 Anxiety disorder, unspecified: Secondary | ICD-10-CM | POA: Diagnosis not present

## 2021-03-31 DIAGNOSIS — F39 Unspecified mood [affective] disorder: Secondary | ICD-10-CM | POA: Diagnosis not present

## 2021-04-08 DIAGNOSIS — H43812 Vitreous degeneration, left eye: Secondary | ICD-10-CM | POA: Diagnosis not present

## 2021-04-08 DIAGNOSIS — H353221 Exudative age-related macular degeneration, left eye, with active choroidal neovascularization: Secondary | ICD-10-CM | POA: Diagnosis not present

## 2021-04-08 DIAGNOSIS — H353213 Exudative age-related macular degeneration, right eye, with inactive scar: Secondary | ICD-10-CM | POA: Diagnosis not present

## 2021-04-08 DIAGNOSIS — H35423 Microcystoid degeneration of retina, bilateral: Secondary | ICD-10-CM | POA: Diagnosis not present

## 2021-04-18 DIAGNOSIS — R262 Difficulty in walking, not elsewhere classified: Secondary | ICD-10-CM | POA: Diagnosis not present

## 2021-04-18 DIAGNOSIS — R278 Other lack of coordination: Secondary | ICD-10-CM | POA: Diagnosis not present

## 2021-04-18 DIAGNOSIS — M150 Primary generalized (osteo)arthritis: Secondary | ICD-10-CM | POA: Diagnosis not present

## 2021-04-18 DIAGNOSIS — I69854 Hemiplegia and hemiparesis following other cerebrovascular disease affecting left non-dominant side: Secondary | ICD-10-CM | POA: Diagnosis not present

## 2021-04-18 DIAGNOSIS — M6281 Muscle weakness (generalized): Secondary | ICD-10-CM | POA: Diagnosis not present

## 2021-04-26 DIAGNOSIS — E0590 Thyrotoxicosis, unspecified without thyrotoxic crisis or storm: Secondary | ICD-10-CM | POA: Diagnosis not present

## 2021-05-03 DIAGNOSIS — C9510 Chronic leukemia of unspecified cell type not having achieved remission: Secondary | ICD-10-CM | POA: Diagnosis not present

## 2021-05-03 DIAGNOSIS — F39 Unspecified mood [affective] disorder: Secondary | ICD-10-CM | POA: Diagnosis not present

## 2021-05-03 DIAGNOSIS — D72829 Elevated white blood cell count, unspecified: Secondary | ICD-10-CM | POA: Diagnosis not present

## 2021-05-03 DIAGNOSIS — E039 Hypothyroidism, unspecified: Secondary | ICD-10-CM | POA: Diagnosis not present

## 2021-05-12 DIAGNOSIS — F419 Anxiety disorder, unspecified: Secondary | ICD-10-CM | POA: Diagnosis not present

## 2021-05-12 DIAGNOSIS — F39 Unspecified mood [affective] disorder: Secondary | ICD-10-CM | POA: Diagnosis not present

## 2021-05-13 DIAGNOSIS — F39 Unspecified mood [affective] disorder: Secondary | ICD-10-CM | POA: Diagnosis not present

## 2021-05-13 DIAGNOSIS — F419 Anxiety disorder, unspecified: Secondary | ICD-10-CM | POA: Diagnosis not present

## 2021-05-19 DIAGNOSIS — F5105 Insomnia due to other mental disorder: Secondary | ICD-10-CM | POA: Diagnosis not present

## 2021-05-19 DIAGNOSIS — F32A Depression, unspecified: Secondary | ICD-10-CM | POA: Diagnosis not present

## 2021-05-19 DIAGNOSIS — F419 Anxiety disorder, unspecified: Secondary | ICD-10-CM | POA: Diagnosis not present

## 2021-05-25 DIAGNOSIS — I69854 Hemiplegia and hemiparesis following other cerebrovascular disease affecting left non-dominant side: Secondary | ICD-10-CM | POA: Diagnosis not present

## 2021-05-25 DIAGNOSIS — M6281 Muscle weakness (generalized): Secondary | ICD-10-CM | POA: Diagnosis not present

## 2021-05-25 DIAGNOSIS — R262 Difficulty in walking, not elsewhere classified: Secondary | ICD-10-CM | POA: Diagnosis not present

## 2021-05-25 DIAGNOSIS — R278 Other lack of coordination: Secondary | ICD-10-CM | POA: Diagnosis not present

## 2021-05-25 DIAGNOSIS — M150 Primary generalized (osteo)arthritis: Secondary | ICD-10-CM | POA: Diagnosis not present

## 2021-05-31 DIAGNOSIS — F419 Anxiety disorder, unspecified: Secondary | ICD-10-CM | POA: Diagnosis not present

## 2021-05-31 DIAGNOSIS — F39 Unspecified mood [affective] disorder: Secondary | ICD-10-CM | POA: Diagnosis not present

## 2021-05-31 DIAGNOSIS — L089 Local infection of the skin and subcutaneous tissue, unspecified: Secondary | ICD-10-CM | POA: Diagnosis not present

## 2021-06-03 DIAGNOSIS — E785 Hyperlipidemia, unspecified: Secondary | ICD-10-CM | POA: Diagnosis not present

## 2021-06-03 DIAGNOSIS — C9510 Chronic leukemia of unspecified cell type not having achieved remission: Secondary | ICD-10-CM | POA: Diagnosis not present

## 2021-06-03 DIAGNOSIS — L089 Local infection of the skin and subcutaneous tissue, unspecified: Secondary | ICD-10-CM | POA: Diagnosis not present

## 2021-06-03 DIAGNOSIS — I69354 Hemiplegia and hemiparesis following cerebral infarction affecting left non-dominant side: Secondary | ICD-10-CM | POA: Diagnosis not present

## 2021-06-03 DIAGNOSIS — I110 Hypertensive heart disease with heart failure: Secondary | ICD-10-CM | POA: Diagnosis not present

## 2021-06-03 DIAGNOSIS — N3281 Overactive bladder: Secondary | ICD-10-CM | POA: Diagnosis not present

## 2021-06-03 DIAGNOSIS — E039 Hypothyroidism, unspecified: Secondary | ICD-10-CM | POA: Diagnosis not present

## 2021-06-03 DIAGNOSIS — K5900 Constipation, unspecified: Secondary | ICD-10-CM | POA: Diagnosis not present

## 2021-06-03 DIAGNOSIS — F3341 Major depressive disorder, recurrent, in partial remission: Secondary | ICD-10-CM | POA: Diagnosis not present

## 2021-06-03 DIAGNOSIS — I700 Atherosclerosis of aorta: Secondary | ICD-10-CM | POA: Diagnosis not present

## 2021-06-03 DIAGNOSIS — D72829 Elevated white blood cell count, unspecified: Secondary | ICD-10-CM | POA: Diagnosis not present

## 2021-06-03 DIAGNOSIS — H3530 Unspecified macular degeneration: Secondary | ICD-10-CM | POA: Diagnosis not present

## 2021-06-03 DIAGNOSIS — I509 Heart failure, unspecified: Secondary | ICD-10-CM | POA: Diagnosis not present

## 2021-06-03 DIAGNOSIS — Z87891 Personal history of nicotine dependence: Secondary | ICD-10-CM | POA: Diagnosis not present

## 2021-06-03 DIAGNOSIS — M159 Polyosteoarthritis, unspecified: Secondary | ICD-10-CM | POA: Diagnosis not present

## 2021-06-03 DIAGNOSIS — F39 Unspecified mood [affective] disorder: Secondary | ICD-10-CM | POA: Diagnosis not present

## 2021-06-03 DIAGNOSIS — Z853 Personal history of malignant neoplasm of breast: Secondary | ICD-10-CM | POA: Diagnosis not present

## 2021-06-03 DIAGNOSIS — Z7982 Long term (current) use of aspirin: Secondary | ICD-10-CM | POA: Diagnosis not present

## 2021-06-09 DIAGNOSIS — F39 Unspecified mood [affective] disorder: Secondary | ICD-10-CM | POA: Diagnosis not present

## 2021-06-09 DIAGNOSIS — F419 Anxiety disorder, unspecified: Secondary | ICD-10-CM | POA: Diagnosis not present

## 2021-06-10 DIAGNOSIS — F419 Anxiety disorder, unspecified: Secondary | ICD-10-CM | POA: Diagnosis not present

## 2021-06-10 DIAGNOSIS — F39 Unspecified mood [affective] disorder: Secondary | ICD-10-CM | POA: Diagnosis not present

## 2021-06-20 DIAGNOSIS — I110 Hypertensive heart disease with heart failure: Secondary | ICD-10-CM | POA: Diagnosis not present

## 2021-06-20 DIAGNOSIS — F39 Unspecified mood [affective] disorder: Secondary | ICD-10-CM | POA: Diagnosis not present

## 2021-06-20 DIAGNOSIS — K5900 Constipation, unspecified: Secondary | ICD-10-CM | POA: Diagnosis not present

## 2021-06-20 DIAGNOSIS — H3530 Unspecified macular degeneration: Secondary | ICD-10-CM | POA: Diagnosis not present

## 2021-06-20 DIAGNOSIS — N3281 Overactive bladder: Secondary | ICD-10-CM | POA: Diagnosis not present

## 2021-06-20 DIAGNOSIS — F3341 Major depressive disorder, recurrent, in partial remission: Secondary | ICD-10-CM | POA: Diagnosis not present

## 2021-06-20 DIAGNOSIS — M159 Polyosteoarthritis, unspecified: Secondary | ICD-10-CM | POA: Diagnosis not present

## 2021-06-20 DIAGNOSIS — L089 Local infection of the skin and subcutaneous tissue, unspecified: Secondary | ICD-10-CM | POA: Diagnosis not present

## 2021-06-20 DIAGNOSIS — Z7982 Long term (current) use of aspirin: Secondary | ICD-10-CM | POA: Diagnosis not present

## 2021-06-20 DIAGNOSIS — Z853 Personal history of malignant neoplasm of breast: Secondary | ICD-10-CM | POA: Diagnosis not present

## 2021-06-20 DIAGNOSIS — C9510 Chronic leukemia of unspecified cell type not having achieved remission: Secondary | ICD-10-CM | POA: Diagnosis not present

## 2021-06-20 DIAGNOSIS — Z87891 Personal history of nicotine dependence: Secondary | ICD-10-CM | POA: Diagnosis not present

## 2021-06-20 DIAGNOSIS — E785 Hyperlipidemia, unspecified: Secondary | ICD-10-CM | POA: Diagnosis not present

## 2021-06-20 DIAGNOSIS — I509 Heart failure, unspecified: Secondary | ICD-10-CM | POA: Diagnosis not present

## 2021-06-20 DIAGNOSIS — I700 Atherosclerosis of aorta: Secondary | ICD-10-CM | POA: Diagnosis not present

## 2021-06-20 DIAGNOSIS — I69354 Hemiplegia and hemiparesis following cerebral infarction affecting left non-dominant side: Secondary | ICD-10-CM | POA: Diagnosis not present

## 2021-06-20 DIAGNOSIS — D72829 Elevated white blood cell count, unspecified: Secondary | ICD-10-CM | POA: Diagnosis not present

## 2021-06-20 DIAGNOSIS — E039 Hypothyroidism, unspecified: Secondary | ICD-10-CM | POA: Diagnosis not present

## 2021-06-22 DIAGNOSIS — M6281 Muscle weakness (generalized): Secondary | ICD-10-CM | POA: Diagnosis not present

## 2021-06-22 DIAGNOSIS — M150 Primary generalized (osteo)arthritis: Secondary | ICD-10-CM | POA: Diagnosis not present

## 2021-06-22 DIAGNOSIS — R278 Other lack of coordination: Secondary | ICD-10-CM | POA: Diagnosis not present

## 2021-06-22 DIAGNOSIS — I69854 Hemiplegia and hemiparesis following other cerebrovascular disease affecting left non-dominant side: Secondary | ICD-10-CM | POA: Diagnosis not present

## 2021-06-22 DIAGNOSIS — R262 Difficulty in walking, not elsewhere classified: Secondary | ICD-10-CM | POA: Diagnosis not present

## 2021-06-24 DIAGNOSIS — F39 Unspecified mood [affective] disorder: Secondary | ICD-10-CM | POA: Diagnosis not present

## 2021-06-24 DIAGNOSIS — F419 Anxiety disorder, unspecified: Secondary | ICD-10-CM | POA: Diagnosis not present

## 2021-06-29 DIAGNOSIS — H353213 Exudative age-related macular degeneration, right eye, with inactive scar: Secondary | ICD-10-CM | POA: Diagnosis not present

## 2021-06-29 DIAGNOSIS — H353221 Exudative age-related macular degeneration, left eye, with active choroidal neovascularization: Secondary | ICD-10-CM | POA: Diagnosis not present

## 2021-06-29 DIAGNOSIS — H43812 Vitreous degeneration, left eye: Secondary | ICD-10-CM | POA: Diagnosis not present

## 2021-07-01 DIAGNOSIS — F39 Unspecified mood [affective] disorder: Secondary | ICD-10-CM | POA: Diagnosis not present

## 2021-07-01 DIAGNOSIS — I679 Cerebrovascular disease, unspecified: Secondary | ICD-10-CM | POA: Diagnosis not present

## 2021-07-01 DIAGNOSIS — F1320 Sedative, hypnotic or anxiolytic dependence, uncomplicated: Secondary | ICD-10-CM | POA: Diagnosis not present

## 2021-07-01 DIAGNOSIS — R3915 Urgency of urination: Secondary | ICD-10-CM | POA: Diagnosis not present

## 2021-07-07 DIAGNOSIS — F419 Anxiety disorder, unspecified: Secondary | ICD-10-CM | POA: Diagnosis not present

## 2021-07-07 DIAGNOSIS — F39 Unspecified mood [affective] disorder: Secondary | ICD-10-CM | POA: Diagnosis not present

## 2021-07-15 DIAGNOSIS — F39 Unspecified mood [affective] disorder: Secondary | ICD-10-CM | POA: Diagnosis not present

## 2021-07-15 DIAGNOSIS — F419 Anxiety disorder, unspecified: Secondary | ICD-10-CM | POA: Diagnosis not present

## 2021-07-19 DIAGNOSIS — E039 Hypothyroidism, unspecified: Secondary | ICD-10-CM | POA: Diagnosis not present

## 2021-07-19 DIAGNOSIS — R262 Difficulty in walking, not elsewhere classified: Secondary | ICD-10-CM | POA: Diagnosis not present

## 2021-07-19 DIAGNOSIS — R251 Tremor, unspecified: Secondary | ICD-10-CM | POA: Diagnosis not present

## 2021-07-19 DIAGNOSIS — F3341 Major depressive disorder, recurrent, in partial remission: Secondary | ICD-10-CM | POA: Diagnosis not present

## 2021-07-21 DIAGNOSIS — M150 Primary generalized (osteo)arthritis: Secondary | ICD-10-CM | POA: Diagnosis not present

## 2021-07-21 DIAGNOSIS — R262 Difficulty in walking, not elsewhere classified: Secondary | ICD-10-CM | POA: Diagnosis not present

## 2021-07-21 DIAGNOSIS — R278 Other lack of coordination: Secondary | ICD-10-CM | POA: Diagnosis not present

## 2021-07-21 DIAGNOSIS — I69854 Hemiplegia and hemiparesis following other cerebrovascular disease affecting left non-dominant side: Secondary | ICD-10-CM | POA: Diagnosis not present

## 2021-07-21 DIAGNOSIS — M6281 Muscle weakness (generalized): Secondary | ICD-10-CM | POA: Diagnosis not present

## 2021-07-22 DIAGNOSIS — M6281 Muscle weakness (generalized): Secondary | ICD-10-CM | POA: Diagnosis not present

## 2021-07-22 DIAGNOSIS — R262 Difficulty in walking, not elsewhere classified: Secondary | ICD-10-CM | POA: Diagnosis not present

## 2021-07-22 DIAGNOSIS — F419 Anxiety disorder, unspecified: Secondary | ICD-10-CM | POA: Diagnosis not present

## 2021-07-22 DIAGNOSIS — F39 Unspecified mood [affective] disorder: Secondary | ICD-10-CM | POA: Diagnosis not present

## 2021-07-22 DIAGNOSIS — I69854 Hemiplegia and hemiparesis following other cerebrovascular disease affecting left non-dominant side: Secondary | ICD-10-CM | POA: Diagnosis not present

## 2021-07-22 DIAGNOSIS — M150 Primary generalized (osteo)arthritis: Secondary | ICD-10-CM | POA: Diagnosis not present

## 2021-07-22 DIAGNOSIS — R278 Other lack of coordination: Secondary | ICD-10-CM | POA: Diagnosis not present

## 2021-07-25 DIAGNOSIS — R262 Difficulty in walking, not elsewhere classified: Secondary | ICD-10-CM | POA: Diagnosis not present

## 2021-07-25 DIAGNOSIS — I69854 Hemiplegia and hemiparesis following other cerebrovascular disease affecting left non-dominant side: Secondary | ICD-10-CM | POA: Diagnosis not present

## 2021-07-25 DIAGNOSIS — R278 Other lack of coordination: Secondary | ICD-10-CM | POA: Diagnosis not present

## 2021-07-25 DIAGNOSIS — M150 Primary generalized (osteo)arthritis: Secondary | ICD-10-CM | POA: Diagnosis not present

## 2021-07-25 DIAGNOSIS — M6281 Muscle weakness (generalized): Secondary | ICD-10-CM | POA: Diagnosis not present

## 2021-07-26 DIAGNOSIS — R262 Difficulty in walking, not elsewhere classified: Secondary | ICD-10-CM | POA: Diagnosis not present

## 2021-07-26 DIAGNOSIS — R946 Abnormal results of thyroid function studies: Secondary | ICD-10-CM | POA: Diagnosis not present

## 2021-07-26 DIAGNOSIS — R9401 Abnormal electroencephalogram [EEG]: Secondary | ICD-10-CM | POA: Diagnosis not present

## 2021-07-26 DIAGNOSIS — R799 Abnormal finding of blood chemistry, unspecified: Secondary | ICD-10-CM | POA: Diagnosis not present

## 2021-07-27 DIAGNOSIS — M6281 Muscle weakness (generalized): Secondary | ICD-10-CM | POA: Diagnosis not present

## 2021-07-27 DIAGNOSIS — R278 Other lack of coordination: Secondary | ICD-10-CM | POA: Diagnosis not present

## 2021-07-27 DIAGNOSIS — M150 Primary generalized (osteo)arthritis: Secondary | ICD-10-CM | POA: Diagnosis not present

## 2021-07-27 DIAGNOSIS — R262 Difficulty in walking, not elsewhere classified: Secondary | ICD-10-CM | POA: Diagnosis not present

## 2021-07-27 DIAGNOSIS — I69854 Hemiplegia and hemiparesis following other cerebrovascular disease affecting left non-dominant side: Secondary | ICD-10-CM | POA: Diagnosis not present

## 2021-07-29 DIAGNOSIS — R0989 Other specified symptoms and signs involving the circulatory and respiratory systems: Secondary | ICD-10-CM | POA: Diagnosis not present

## 2021-07-29 DIAGNOSIS — D696 Thrombocytopenia, unspecified: Secondary | ICD-10-CM | POA: Diagnosis not present

## 2021-07-29 DIAGNOSIS — J4520 Mild intermittent asthma, uncomplicated: Secondary | ICD-10-CM | POA: Diagnosis not present

## 2021-07-29 DIAGNOSIS — C9590 Leukemia, unspecified not having achieved remission: Secondary | ICD-10-CM | POA: Diagnosis not present

## 2021-08-01 DIAGNOSIS — M6281 Muscle weakness (generalized): Secondary | ICD-10-CM | POA: Diagnosis not present

## 2021-08-01 DIAGNOSIS — M150 Primary generalized (osteo)arthritis: Secondary | ICD-10-CM | POA: Diagnosis not present

## 2021-08-01 DIAGNOSIS — I69854 Hemiplegia and hemiparesis following other cerebrovascular disease affecting left non-dominant side: Secondary | ICD-10-CM | POA: Diagnosis not present

## 2021-08-01 DIAGNOSIS — R262 Difficulty in walking, not elsewhere classified: Secondary | ICD-10-CM | POA: Diagnosis not present

## 2021-08-01 DIAGNOSIS — R278 Other lack of coordination: Secondary | ICD-10-CM | POA: Diagnosis not present

## 2021-08-02 DIAGNOSIS — F39 Unspecified mood [affective] disorder: Secondary | ICD-10-CM | POA: Diagnosis not present

## 2021-08-02 DIAGNOSIS — F419 Anxiety disorder, unspecified: Secondary | ICD-10-CM | POA: Diagnosis not present

## 2021-08-03 DIAGNOSIS — R262 Difficulty in walking, not elsewhere classified: Secondary | ICD-10-CM | POA: Diagnosis not present

## 2021-08-03 DIAGNOSIS — M6281 Muscle weakness (generalized): Secondary | ICD-10-CM | POA: Diagnosis not present

## 2021-08-03 DIAGNOSIS — M150 Primary generalized (osteo)arthritis: Secondary | ICD-10-CM | POA: Diagnosis not present

## 2021-08-03 DIAGNOSIS — R278 Other lack of coordination: Secondary | ICD-10-CM | POA: Diagnosis not present

## 2021-08-03 DIAGNOSIS — I69854 Hemiplegia and hemiparesis following other cerebrovascular disease affecting left non-dominant side: Secondary | ICD-10-CM | POA: Diagnosis not present

## 2021-08-05 DIAGNOSIS — F39 Unspecified mood [affective] disorder: Secondary | ICD-10-CM | POA: Diagnosis not present

## 2021-08-05 DIAGNOSIS — F419 Anxiety disorder, unspecified: Secondary | ICD-10-CM | POA: Diagnosis not present

## 2021-08-08 DIAGNOSIS — M6281 Muscle weakness (generalized): Secondary | ICD-10-CM | POA: Diagnosis not present

## 2021-08-08 DIAGNOSIS — R262 Difficulty in walking, not elsewhere classified: Secondary | ICD-10-CM | POA: Diagnosis not present

## 2021-08-08 DIAGNOSIS — I69854 Hemiplegia and hemiparesis following other cerebrovascular disease affecting left non-dominant side: Secondary | ICD-10-CM | POA: Diagnosis not present

## 2021-08-08 DIAGNOSIS — M150 Primary generalized (osteo)arthritis: Secondary | ICD-10-CM | POA: Diagnosis not present

## 2021-08-08 DIAGNOSIS — R278 Other lack of coordination: Secondary | ICD-10-CM | POA: Diagnosis not present

## 2021-08-09 DIAGNOSIS — R278 Other lack of coordination: Secondary | ICD-10-CM | POA: Diagnosis not present

## 2021-08-09 DIAGNOSIS — M6281 Muscle weakness (generalized): Secondary | ICD-10-CM | POA: Diagnosis not present

## 2021-08-09 DIAGNOSIS — R262 Difficulty in walking, not elsewhere classified: Secondary | ICD-10-CM | POA: Diagnosis not present

## 2021-08-09 DIAGNOSIS — M150 Primary generalized (osteo)arthritis: Secondary | ICD-10-CM | POA: Diagnosis not present

## 2021-08-09 DIAGNOSIS — I69854 Hemiplegia and hemiparesis following other cerebrovascular disease affecting left non-dominant side: Secondary | ICD-10-CM | POA: Diagnosis not present

## 2021-08-15 DIAGNOSIS — R262 Difficulty in walking, not elsewhere classified: Secondary | ICD-10-CM | POA: Diagnosis not present

## 2021-08-15 DIAGNOSIS — I69854 Hemiplegia and hemiparesis following other cerebrovascular disease affecting left non-dominant side: Secondary | ICD-10-CM | POA: Diagnosis not present

## 2021-08-15 DIAGNOSIS — M150 Primary generalized (osteo)arthritis: Secondary | ICD-10-CM | POA: Diagnosis not present

## 2021-08-15 DIAGNOSIS — M6281 Muscle weakness (generalized): Secondary | ICD-10-CM | POA: Diagnosis not present

## 2021-08-15 DIAGNOSIS — R278 Other lack of coordination: Secondary | ICD-10-CM | POA: Diagnosis not present

## 2021-08-17 DIAGNOSIS — F419 Anxiety disorder, unspecified: Secondary | ICD-10-CM | POA: Diagnosis not present

## 2021-08-17 DIAGNOSIS — F39 Unspecified mood [affective] disorder: Secondary | ICD-10-CM | POA: Diagnosis not present

## 2021-08-18 DIAGNOSIS — I69854 Hemiplegia and hemiparesis following other cerebrovascular disease affecting left non-dominant side: Secondary | ICD-10-CM | POA: Diagnosis not present

## 2021-08-18 DIAGNOSIS — R262 Difficulty in walking, not elsewhere classified: Secondary | ICD-10-CM | POA: Diagnosis not present

## 2021-08-18 DIAGNOSIS — R278 Other lack of coordination: Secondary | ICD-10-CM | POA: Diagnosis not present

## 2021-08-18 DIAGNOSIS — M6281 Muscle weakness (generalized): Secondary | ICD-10-CM | POA: Diagnosis not present

## 2021-08-18 DIAGNOSIS — M150 Primary generalized (osteo)arthritis: Secondary | ICD-10-CM | POA: Diagnosis not present

## 2021-08-26 DIAGNOSIS — F1320 Sedative, hypnotic or anxiolytic dependence, uncomplicated: Secondary | ICD-10-CM | POA: Diagnosis not present

## 2021-08-26 DIAGNOSIS — L989 Disorder of the skin and subcutaneous tissue, unspecified: Secondary | ICD-10-CM | POA: Diagnosis not present

## 2021-08-31 DIAGNOSIS — H35423 Microcystoid degeneration of retina, bilateral: Secondary | ICD-10-CM | POA: Diagnosis not present

## 2021-08-31 DIAGNOSIS — H353221 Exudative age-related macular degeneration, left eye, with active choroidal neovascularization: Secondary | ICD-10-CM | POA: Diagnosis not present

## 2021-08-31 DIAGNOSIS — H353213 Exudative age-related macular degeneration, right eye, with inactive scar: Secondary | ICD-10-CM | POA: Diagnosis not present

## 2021-08-31 DIAGNOSIS — H43812 Vitreous degeneration, left eye: Secondary | ICD-10-CM | POA: Diagnosis not present

## 2021-09-06 DIAGNOSIS — F330 Major depressive disorder, recurrent, mild: Secondary | ICD-10-CM | POA: Diagnosis not present

## 2021-09-06 DIAGNOSIS — F419 Anxiety disorder, unspecified: Secondary | ICD-10-CM | POA: Diagnosis not present

## 2021-09-08 DIAGNOSIS — F419 Anxiety disorder, unspecified: Secondary | ICD-10-CM | POA: Diagnosis not present

## 2021-09-08 DIAGNOSIS — F39 Unspecified mood [affective] disorder: Secondary | ICD-10-CM | POA: Diagnosis not present

## 2021-09-19 DIAGNOSIS — R262 Difficulty in walking, not elsewhere classified: Secondary | ICD-10-CM | POA: Diagnosis not present

## 2021-09-19 DIAGNOSIS — M6281 Muscle weakness (generalized): Secondary | ICD-10-CM | POA: Diagnosis not present

## 2021-09-19 DIAGNOSIS — M150 Primary generalized (osteo)arthritis: Secondary | ICD-10-CM | POA: Diagnosis not present

## 2021-09-19 DIAGNOSIS — R278 Other lack of coordination: Secondary | ICD-10-CM | POA: Diagnosis not present

## 2021-09-19 DIAGNOSIS — I69854 Hemiplegia and hemiparesis following other cerebrovascular disease affecting left non-dominant side: Secondary | ICD-10-CM | POA: Diagnosis not present

## 2021-09-30 DIAGNOSIS — F419 Anxiety disorder, unspecified: Secondary | ICD-10-CM | POA: Diagnosis not present

## 2021-09-30 DIAGNOSIS — F39 Unspecified mood [affective] disorder: Secondary | ICD-10-CM | POA: Diagnosis not present

## 2021-10-05 DIAGNOSIS — H353213 Exudative age-related macular degeneration, right eye, with inactive scar: Secondary | ICD-10-CM | POA: Diagnosis not present

## 2021-10-05 DIAGNOSIS — H43812 Vitreous degeneration, left eye: Secondary | ICD-10-CM | POA: Diagnosis not present

## 2021-10-05 DIAGNOSIS — H35423 Microcystoid degeneration of retina, bilateral: Secondary | ICD-10-CM | POA: Diagnosis not present

## 2021-10-05 DIAGNOSIS — H353221 Exudative age-related macular degeneration, left eye, with active choroidal neovascularization: Secondary | ICD-10-CM | POA: Diagnosis not present

## 2021-10-13 DIAGNOSIS — F39 Unspecified mood [affective] disorder: Secondary | ICD-10-CM | POA: Diagnosis not present

## 2021-10-13 DIAGNOSIS — F419 Anxiety disorder, unspecified: Secondary | ICD-10-CM | POA: Diagnosis not present

## 2021-10-18 DIAGNOSIS — F419 Anxiety disorder, unspecified: Secondary | ICD-10-CM | POA: Diagnosis not present

## 2021-10-18 DIAGNOSIS — F39 Unspecified mood [affective] disorder: Secondary | ICD-10-CM | POA: Diagnosis not present

## 2021-10-19 DIAGNOSIS — M150 Primary generalized (osteo)arthritis: Secondary | ICD-10-CM | POA: Diagnosis not present

## 2021-10-19 DIAGNOSIS — R278 Other lack of coordination: Secondary | ICD-10-CM | POA: Diagnosis not present

## 2021-10-19 DIAGNOSIS — R262 Difficulty in walking, not elsewhere classified: Secondary | ICD-10-CM | POA: Diagnosis not present

## 2021-10-19 DIAGNOSIS — M6281 Muscle weakness (generalized): Secondary | ICD-10-CM | POA: Diagnosis not present

## 2021-10-19 DIAGNOSIS — I69854 Hemiplegia and hemiparesis following other cerebrovascular disease affecting left non-dominant side: Secondary | ICD-10-CM | POA: Diagnosis not present

## 2021-10-25 DIAGNOSIS — F39 Unspecified mood [affective] disorder: Secondary | ICD-10-CM | POA: Diagnosis not present

## 2021-10-25 DIAGNOSIS — R0989 Other specified symptoms and signs involving the circulatory and respiratory systems: Secondary | ICD-10-CM | POA: Diagnosis not present

## 2021-10-25 DIAGNOSIS — R0981 Nasal congestion: Secondary | ICD-10-CM | POA: Diagnosis not present

## 2021-10-25 DIAGNOSIS — J029 Acute pharyngitis, unspecified: Secondary | ICD-10-CM | POA: Diagnosis not present

## 2021-10-26 DIAGNOSIS — R0989 Other specified symptoms and signs involving the circulatory and respiratory systems: Secondary | ICD-10-CM | POA: Diagnosis not present

## 2021-10-28 DIAGNOSIS — F39 Unspecified mood [affective] disorder: Secondary | ICD-10-CM | POA: Diagnosis not present

## 2021-10-28 DIAGNOSIS — I679 Cerebrovascular disease, unspecified: Secondary | ICD-10-CM | POA: Diagnosis not present

## 2021-10-28 DIAGNOSIS — C9510 Chronic leukemia of unspecified cell type not having achieved remission: Secondary | ICD-10-CM | POA: Diagnosis not present

## 2021-10-28 DIAGNOSIS — F419 Anxiety disorder, unspecified: Secondary | ICD-10-CM | POA: Diagnosis not present

## 2021-10-28 DIAGNOSIS — F1320 Sedative, hypnotic or anxiolytic dependence, uncomplicated: Secondary | ICD-10-CM | POA: Diagnosis not present

## 2021-10-28 DIAGNOSIS — R269 Unspecified abnormalities of gait and mobility: Secondary | ICD-10-CM | POA: Diagnosis not present

## 2021-11-03 DIAGNOSIS — F419 Anxiety disorder, unspecified: Secondary | ICD-10-CM | POA: Diagnosis not present

## 2021-11-03 DIAGNOSIS — F39 Unspecified mood [affective] disorder: Secondary | ICD-10-CM | POA: Diagnosis not present

## 2021-11-10 DIAGNOSIS — F39 Unspecified mood [affective] disorder: Secondary | ICD-10-CM | POA: Diagnosis not present

## 2021-11-10 DIAGNOSIS — G479 Sleep disorder, unspecified: Secondary | ICD-10-CM | POA: Diagnosis not present

## 2021-11-10 DIAGNOSIS — F419 Anxiety disorder, unspecified: Secondary | ICD-10-CM | POA: Diagnosis not present

## 2021-11-17 DIAGNOSIS — M150 Primary generalized (osteo)arthritis: Secondary | ICD-10-CM | POA: Diagnosis not present

## 2021-11-17 DIAGNOSIS — I69854 Hemiplegia and hemiparesis following other cerebrovascular disease affecting left non-dominant side: Secondary | ICD-10-CM | POA: Diagnosis not present

## 2021-11-17 DIAGNOSIS — R278 Other lack of coordination: Secondary | ICD-10-CM | POA: Diagnosis not present

## 2021-11-17 DIAGNOSIS — R262 Difficulty in walking, not elsewhere classified: Secondary | ICD-10-CM | POA: Diagnosis not present

## 2021-11-17 DIAGNOSIS — M6281 Muscle weakness (generalized): Secondary | ICD-10-CM | POA: Diagnosis not present

## 2021-11-18 DIAGNOSIS — F419 Anxiety disorder, unspecified: Secondary | ICD-10-CM | POA: Diagnosis not present

## 2021-11-18 DIAGNOSIS — F39 Unspecified mood [affective] disorder: Secondary | ICD-10-CM | POA: Diagnosis not present

## 2021-11-29 DIAGNOSIS — F39 Unspecified mood [affective] disorder: Secondary | ICD-10-CM | POA: Diagnosis not present

## 2021-11-29 DIAGNOSIS — F419 Anxiety disorder, unspecified: Secondary | ICD-10-CM | POA: Diagnosis not present

## 2021-12-08 DIAGNOSIS — F39 Unspecified mood [affective] disorder: Secondary | ICD-10-CM | POA: Diagnosis not present

## 2021-12-08 DIAGNOSIS — G479 Sleep disorder, unspecified: Secondary | ICD-10-CM | POA: Diagnosis not present

## 2021-12-08 DIAGNOSIS — F419 Anxiety disorder, unspecified: Secondary | ICD-10-CM | POA: Diagnosis not present

## 2021-12-15 DIAGNOSIS — F419 Anxiety disorder, unspecified: Secondary | ICD-10-CM | POA: Diagnosis not present

## 2021-12-15 DIAGNOSIS — F39 Unspecified mood [affective] disorder: Secondary | ICD-10-CM | POA: Diagnosis not present

## 2021-12-19 DIAGNOSIS — M150 Primary generalized (osteo)arthritis: Secondary | ICD-10-CM | POA: Diagnosis not present

## 2021-12-19 DIAGNOSIS — I69854 Hemiplegia and hemiparesis following other cerebrovascular disease affecting left non-dominant side: Secondary | ICD-10-CM | POA: Diagnosis not present

## 2021-12-19 DIAGNOSIS — R278 Other lack of coordination: Secondary | ICD-10-CM | POA: Diagnosis not present

## 2021-12-19 DIAGNOSIS — M6281 Muscle weakness (generalized): Secondary | ICD-10-CM | POA: Diagnosis not present

## 2021-12-19 DIAGNOSIS — R262 Difficulty in walking, not elsewhere classified: Secondary | ICD-10-CM | POA: Diagnosis not present

## 2021-12-20 DIAGNOSIS — N390 Urinary tract infection, site not specified: Secondary | ICD-10-CM | POA: Diagnosis not present

## 2021-12-20 DIAGNOSIS — R300 Dysuria: Secondary | ICD-10-CM | POA: Diagnosis not present

## 2021-12-20 DIAGNOSIS — R070 Pain in throat: Secondary | ICD-10-CM | POA: Diagnosis not present

## 2021-12-20 DIAGNOSIS — K219 Gastro-esophageal reflux disease without esophagitis: Secondary | ICD-10-CM | POA: Diagnosis not present

## 2021-12-23 DIAGNOSIS — R8271 Bacteriuria: Secondary | ICD-10-CM | POA: Diagnosis not present

## 2021-12-28 DIAGNOSIS — F419 Anxiety disorder, unspecified: Secondary | ICD-10-CM | POA: Diagnosis not present

## 2021-12-28 DIAGNOSIS — F39 Unspecified mood [affective] disorder: Secondary | ICD-10-CM | POA: Diagnosis not present

## 2022-01-03 DIAGNOSIS — B379 Candidiasis, unspecified: Secondary | ICD-10-CM | POA: Diagnosis not present

## 2022-01-03 DIAGNOSIS — C9590 Leukemia, unspecified not having achieved remission: Secondary | ICD-10-CM | POA: Diagnosis not present

## 2022-01-03 DIAGNOSIS — B372 Candidiasis of skin and nail: Secondary | ICD-10-CM | POA: Diagnosis not present

## 2022-01-03 DIAGNOSIS — N390 Urinary tract infection, site not specified: Secondary | ICD-10-CM | POA: Diagnosis not present

## 2022-01-06 DIAGNOSIS — F419 Anxiety disorder, unspecified: Secondary | ICD-10-CM | POA: Diagnosis not present

## 2022-01-06 DIAGNOSIS — G479 Sleep disorder, unspecified: Secondary | ICD-10-CM | POA: Diagnosis not present

## 2022-01-06 DIAGNOSIS — F39 Unspecified mood [affective] disorder: Secondary | ICD-10-CM | POA: Diagnosis not present

## 2022-01-07 DIAGNOSIS — C9110 Chronic lymphocytic leukemia of B-cell type not having achieved remission: Secondary | ICD-10-CM | POA: Diagnosis not present

## 2022-01-07 DIAGNOSIS — G459 Transient cerebral ischemic attack, unspecified: Secondary | ICD-10-CM | POA: Diagnosis not present

## 2022-01-07 DIAGNOSIS — R404 Transient alteration of awareness: Secondary | ICD-10-CM | POA: Diagnosis not present

## 2022-01-07 DIAGNOSIS — Z20822 Contact with and (suspected) exposure to covid-19: Secondary | ICD-10-CM | POA: Diagnosis not present

## 2022-01-07 DIAGNOSIS — I10 Essential (primary) hypertension: Secondary | ICD-10-CM | POA: Diagnosis not present

## 2022-01-07 DIAGNOSIS — R0602 Shortness of breath: Secondary | ICD-10-CM | POA: Diagnosis not present

## 2022-01-07 DIAGNOSIS — Z7401 Bed confinement status: Secondary | ICD-10-CM | POA: Diagnosis not present

## 2022-01-07 DIAGNOSIS — R0902 Hypoxemia: Secondary | ICD-10-CM | POA: Diagnosis not present

## 2022-01-08 DIAGNOSIS — R0902 Hypoxemia: Secondary | ICD-10-CM | POA: Diagnosis not present

## 2022-01-08 DIAGNOSIS — Z20822 Contact with and (suspected) exposure to covid-19: Secondary | ICD-10-CM | POA: Diagnosis not present

## 2022-01-08 DIAGNOSIS — C9110 Chronic lymphocytic leukemia of B-cell type not having achieved remission: Secondary | ICD-10-CM | POA: Diagnosis not present

## 2022-01-10 DIAGNOSIS — G479 Sleep disorder, unspecified: Secondary | ICD-10-CM | POA: Diagnosis not present

## 2022-01-10 DIAGNOSIS — R0902 Hypoxemia: Secondary | ICD-10-CM | POA: Diagnosis not present

## 2022-01-10 DIAGNOSIS — C9510 Chronic leukemia of unspecified cell type not having achieved remission: Secondary | ICD-10-CM | POA: Diagnosis not present

## 2022-01-10 DIAGNOSIS — F419 Anxiety disorder, unspecified: Secondary | ICD-10-CM | POA: Diagnosis not present

## 2022-01-10 DIAGNOSIS — F39 Unspecified mood [affective] disorder: Secondary | ICD-10-CM | POA: Diagnosis not present

## 2022-01-10 DIAGNOSIS — C9590 Leukemia, unspecified not having achieved remission: Secondary | ICD-10-CM | POA: Diagnosis not present

## 2022-01-10 DIAGNOSIS — R590 Localized enlarged lymph nodes: Secondary | ICD-10-CM | POA: Diagnosis not present

## 2022-01-16 DIAGNOSIS — R6889 Other general symptoms and signs: Secondary | ICD-10-CM | POA: Diagnosis not present

## 2022-01-17 DIAGNOSIS — B372 Candidiasis of skin and nail: Secondary | ICD-10-CM | POA: Diagnosis not present

## 2022-01-17 DIAGNOSIS — R0902 Hypoxemia: Secondary | ICD-10-CM | POA: Diagnosis not present

## 2022-01-17 DIAGNOSIS — T17308A Unspecified foreign body in larynx causing other injury, initial encounter: Secondary | ICD-10-CM | POA: Diagnosis not present

## 2022-01-17 DIAGNOSIS — R070 Pain in throat: Secondary | ICD-10-CM | POA: Diagnosis not present

## 2022-01-18 DIAGNOSIS — M6281 Muscle weakness (generalized): Secondary | ICD-10-CM | POA: Diagnosis not present

## 2022-01-18 DIAGNOSIS — M150 Primary generalized (osteo)arthritis: Secondary | ICD-10-CM | POA: Diagnosis not present

## 2022-01-18 DIAGNOSIS — I69854 Hemiplegia and hemiparesis following other cerebrovascular disease affecting left non-dominant side: Secondary | ICD-10-CM | POA: Diagnosis not present

## 2022-01-18 DIAGNOSIS — R262 Difficulty in walking, not elsewhere classified: Secondary | ICD-10-CM | POA: Diagnosis not present

## 2022-01-18 DIAGNOSIS — R278 Other lack of coordination: Secondary | ICD-10-CM | POA: Diagnosis not present

## 2022-01-20 ENCOUNTER — Telehealth: Payer: Self-pay | Admitting: Hematology

## 2022-01-20 NOTE — Telephone Encounter (Signed)
Scheduled appt per 5/5 referral. Representative at [facility] is aware of appt and will arrange transportation for pt.  ?

## 2022-01-25 DIAGNOSIS — H35423 Microcystoid degeneration of retina, bilateral: Secondary | ICD-10-CM | POA: Diagnosis not present

## 2022-01-25 DIAGNOSIS — H43813 Vitreous degeneration, bilateral: Secondary | ICD-10-CM | POA: Diagnosis not present

## 2022-01-25 DIAGNOSIS — H353213 Exudative age-related macular degeneration, right eye, with inactive scar: Secondary | ICD-10-CM | POA: Diagnosis not present

## 2022-01-25 DIAGNOSIS — H353221 Exudative age-related macular degeneration, left eye, with active choroidal neovascularization: Secondary | ICD-10-CM | POA: Diagnosis not present

## 2022-01-26 DIAGNOSIS — F39 Unspecified mood [affective] disorder: Secondary | ICD-10-CM | POA: Diagnosis not present

## 2022-01-26 DIAGNOSIS — F419 Anxiety disorder, unspecified: Secondary | ICD-10-CM | POA: Diagnosis not present

## 2022-02-02 DIAGNOSIS — F419 Anxiety disorder, unspecified: Secondary | ICD-10-CM | POA: Diagnosis not present

## 2022-02-02 DIAGNOSIS — F39 Unspecified mood [affective] disorder: Secondary | ICD-10-CM | POA: Diagnosis not present

## 2022-02-02 DIAGNOSIS — G479 Sleep disorder, unspecified: Secondary | ICD-10-CM | POA: Diagnosis not present

## 2022-02-08 ENCOUNTER — Inpatient Hospital Stay: Payer: PPO | Attending: Hematology | Admitting: Hematology

## 2022-02-08 VITALS — BP 134/73 | HR 58 | Temp 97.5°F | Resp 18 | Wt 131.7 lb

## 2022-02-08 DIAGNOSIS — F39 Unspecified mood [affective] disorder: Secondary | ICD-10-CM | POA: Diagnosis not present

## 2022-02-08 DIAGNOSIS — C9110 Chronic lymphocytic leukemia of B-cell type not having achieved remission: Secondary | ICD-10-CM | POA: Insufficient documentation

## 2022-02-08 DIAGNOSIS — Z87891 Personal history of nicotine dependence: Secondary | ICD-10-CM | POA: Diagnosis not present

## 2022-02-08 DIAGNOSIS — Z853 Personal history of malignant neoplasm of breast: Secondary | ICD-10-CM | POA: Insufficient documentation

## 2022-02-08 DIAGNOSIS — F419 Anxiety disorder, unspecified: Secondary | ICD-10-CM | POA: Diagnosis not present

## 2022-02-08 NOTE — Progress Notes (Signed)
HEMATOLOGY/ONCOLOGY CLINIC NOTE  Date of Service: 02/08/2022  Patient Care Team: Raymondo Band, MD as PCP - General (Family Medicine)  CHIEF COMPLAINTS/PURPOSE OF CONSULTATION:  Mx of CLL  ONCOLOGIC HISTORY CLL was diagnosed in 2004 and has not required intervention; the slightly progressive thrombocytopenia has not been symptomatic. Abdominal US in Oct 2012 had normal splenic volume. Platelets have been 90-100k back to 2011.  She had two short courses of prednisone at just 20 mg daily x 3 every other week in early 2013, tolerated without difficulty and some increase in platelets noted.     PMH is also significant for an early stage left breast cancer in 2004, treated with lumpectomy with sentinel node evaluation, local RT and 5 years of [facility] thru 12-2007. She had bilateral 3D mammograms at [facility] 11-24-15, still heterogeneously dense breast tissue but without mamographic findings of concern.  HISTORY OF PRESENTING ILLNESS:  Please see previous note for details on initial presentation  INTERVAL HISTORY:  Abigail Moreno is a wonderful 88 y.o. female who has been referred to [place] by Dr Keenan Bachelor, Rozanna Boer, MD for evaluation and consultation after recent labs showing persistent leukocytosis and thrombocytopenia. She presents today accompanied by her niece. She reports She is doing well.  She notes no new changes or concerns within last 6 months.  She is eating and drinking well.  She notes minor weight loss due to minor appetite decrease and food at facility. We discussed trying boost or ensure which she is agreeable to.   She reports some issues swallowing once in a while.   No fever, chills, night sweats. No new lumps, bumps, or lesions/rashes. No abdominal pain or change in bowel habits. No new or unexpected weight loss. No SOB or chest pain. No other new or acute focal symptoms.  She currently lives in assisted living.  Labs done 01/07/2022 were reviewed in  detail. CBC shows elevated WBC count of 36.3k and decreased platelet count of 121k. CMP unremarkable.  MEDICAL HISTORY:  Past Medical History:  Diagnosis Date   Anxiety    Arthritis of knee  FALL 2012   RIGHT KNEE, hips, and fingers   Breast cancer ([place]) FEBRUARY 2004   T1, NO, ER/PR POSITIVE LEFT BREAST   CLL (chronic lymphocytic leukemia) ([place]) 2004   Dr. Marko Plume is oncologist   Dyspnea    occ sob with exertion   History of blood transfusion    with first and maybe second delivery   Hyperlipemia    Hypertension    Macular degeneration    both eyes   Osteoporosis    borderline   UTI (urinary tract infection)    on nitrofuratonin bid x 7 days started 09-12-18 am    SURGICAL HISTORY: Past Surgical History:  Procedure Laterality Date   ABDOMINAL HYSTERECTOMY  1972   UNILATERAL OOPHORECTOMY( LEFT)   APPENDECTOMY     EYE SURGERY Bilateral    cataracts   INCONTINENCE SURGERY     MASTECTOMY PARTIAL / LUMPECTOMY W/ AXILLARY LYMPHADENECTOMY  FEBRUARY 2004   LEFT BREAST   OTHER SURGICAL HISTORY     cysts removed from breasts    TOTAL HIP ARTHROPLASTY  12/26/2011   Procedure: TOTAL HIP ARTHROPLASTY ANTERIOR APPROACH;  Surgeon: Mauri Pole, MD;  Location: [facility];  Service: Orthopedics;  Laterality: Right;   TOTAL HIP ARTHROPLASTY Left 09/17/2018   Procedure: TOTAL HIP ARTHROPLASTY ANTERIOR APPROACH;  Surgeon: Paralee Cancel, MD;  Location: [facility];  Service: Orthopedics;  Laterality: Left;  70 mins    SOCIAL HISTORY: Social History   Socioeconomic History   Marital status: Married    Spouse name: Not on file   Number of children: Not on file   Years of education: Not on file   Highest education level: Not on file  Occupational History   Not on file  Tobacco Use   Smoking status: Former    Packs/day: 0.25    Years: 2.00    Pack years: 0.50    Types: Cigarettes    Quit date: 09/18/1958    Years since quitting: 63.4   Smokeless tobacco: Never  Vaping Use   Vaping  Use: Never used  Substance and Sexual Activity   Alcohol use: No    Alcohol/week: 0.0 standard drinks   Drug use: No   Sexual activity: Not Currently    Birth control/protection: Surgical    Comment: hysterectomy  Other Topics Concern   Not on file  Social History Narrative   Not on file   Social Determinants of Health   Financial Resource Strain: Not on file  Food Insecurity: Not on file  Transportation Needs: Not on file  Physical Activity: Not on file  Stress: Not on file  Social Connections: Not on file  Intimate Partner Violence: Not on file    FAMILY HISTORY: Family History  Problem Relation Age of Onset   Pancreatic cancer Maternal Grandmother    Cancer Paternal Grandmother        ? type    Cancer Other        breast-1st paternal cousin-no treatment, liver cancer-2nd maternal cousin   Hypertension Mother    Hypertension Sister        x2   High Cholesterol Sister        x2   Heart attack Father     ALLERGIES:  is allergic to colesevelam hcl, ezetimibe, procaine hcl, rosuvastatin, simvastatin, statins, and sulfa antibiotics.  MEDICATIONS:  Current Outpatient Medications  Medication Sig Dispense Refill   acetaminophen (TYLENOL) 325 MG tablet Take 2 tablets (650 mg total) by mouth every 4 (four) hours as needed for mild pain (or temp > 37.5 C (99.5 F)). (Patient taking differently: Take 650 mg by mouth as needed for mild pain (or temp > 37.5 C (99.5 F)). )     ALPRAZolam (XANAX) 0.25 MG tablet Take 1 tablet (0.25 mg total) by mouth 2 (two) times daily as needed for anxiety. 30 tablet 0   aspirin EC 81 MG tablet Take 81 mg by mouth daily.     baclofen (LIORESAL) 10 MG tablet Take 3 tablets (30 mg total) by mouth daily at 8 pm. (Patient taking differently: Take 10 mg by mouth daily at 8 pm. ) 90 tablet 1   Calcium Carb-Cholecalciferol (CALCIUM 500 + D3 PO) Take 1 tablet by mouth 2 (two) times daily.      Cinnamon 500 MG capsule Take 500 mg by mouth 2 (two) times  daily.      CRANBERRY PO Take 1 tablet by mouth 2 (two) times daily.      escitalopram (LEXAPRO) 5 MG tablet Take 1 tablet (5 mg total) by mouth daily. (Patient taking differently: Take 5 mg by mouth at bedtime. ) 30 tablet 1   loratadine (CLARITIN) 10 MG tablet Take 10 mg by mouth daily at 12 noon.      Multiple Vitamin (MULTIVITAMIN WITH MINERALS) TABS tablet Take 1 tablet by mouth daily. Women's One A Day 50+  Multivitamin     Multiple Vitamins-Minerals (PRESERVISION AREDS 2 PO) Take 1 tablet by mouth 2 (two) times daily.     Omega-3 Fatty Acids (FISH OIL) 1000 MG CAPS Take 1 capsule (1,000 mg total) by mouth 2 (two) times daily. 60 capsule 0   No current facility-administered medications for this visit.    REVIEW OF SYSTEMS:    10 Point review of Systems was done is negative except as noted above.  PHYSICAL EXAMINATION: ECOG PERFORMANCE STATUS: 1 - Symptomatic but completely ambulatory  . Vitals:   02/08/22 1106  BP: 134/73  Pulse: (!) 58  Resp: 18  Temp: (!) 97.5 F (36.4 C)  SpO2: 94%   .Body mass index is 25.72 kg/m. NAD GENERAL:alert, in no acute distress and comfortable SKIN: no acute rashes, no significant lesions EYES: conjunctiva are pink and non-injected, sclera anicteric NECK: supple, no JVD LYMPH:  no palpable lymphadenopathy in the cervical, axillary or inguinal regions LUNGS: clear to auscultation b/l with normal respiratory effort HEART: regular rate & rhythm ABDOMEN:  normoactive bowel sounds , non tender, not distended. Extremity: no pedal edema PSYCH: alert & oriented x 3 with fluent speech NEURO: no focal motor/sensory deficits  LABORATORY DATA:  I have reviewed the data as listed  .    Latest Ref Rng & Units 08/22/2019    5:19 AM 08/20/2019    5:13 AM 08/14/2019    5:10 AM  CBC  WBC 4.0 - 10.5 K/uL 24.7   22.3   25.8    Hemoglobin 12.0 - 15.0 g/dL 11.9   12.2   11.8    Hematocrit 36.0 - 46.0 % 38.0   38.1   37.5    Platelets 150 - 400 K/uL  116   106   125      .    Latest Ref Rng & Units 08/22/2019    5:19 AM 08/15/2019    6:08 AM 08/14/2019    5:10 AM  CMP  Glucose 70 - 99 mg/dL 81    91    BUN 8 - 23 mg/dL 19    20    Creatinine 0.44 - 1.00 mg/dL 0.48   0.53   0.50    Sodium 135 - 145 mmol/L 138    140    Potassium 3.5 - 5.1 mmol/L 3.8    4.3    Chloride 98 - 111 mmol/L 103    104    CO2 22 - 32 mmol/L 28    26    Calcium 8.9 - 10.3 mg/dL 8.7    8.7     . Lab Results  Component Value Date   LDH 176 11/15/2018      RADIOGRAPHIC STUDIES: I have personally reviewed the radiological images as listed and agreed with the findings in the report. No results found.  ASSESSMENT & PLAN:   88 y.o. very pleasant female with   #1 CLL . Unknown cytogenetics.  Patient has had some chronic thrombocytopenia that is stable in the high 90k range and remains unchanged. Her cytopenia has been responsive to steroids in the past which might suggest an immune competent to it. Plan -Patient has no new constitutional symptoms and her lab shows stable WBC counts no anemia and stable thrombocytopenia . -No indication for treatment of the CLL at this time    #2 history of early stage left breast cancer 2004 status post treatment as noted above. Currently has no new concerns of breast lumps or regional  lymphadenopathy.    Mammograms [facility] 3D - as per patient's report was done in March 2018 and showed no new concerns. Known to have dense breasts.  Plan -I discussed available labs with the patient in details Labs done 01/07/2022 were reviewed in detail. CBC shows elevated WBC count of 36.3k and decreased platelet count of 121k. CMP unremarkable. -No evidence of breast cancer progression at this time. -Continue breast self-examination and physician examination every 6 months with primary care physician . -Patient's chart reviewed in detail -RTC in 11 months with labs.  Follow up: RTC with Dr Irene Limbo with labs in 11 months  All  of the patients questions were answered with apparent satisfaction. The patient knows to call the clinic with any problems, questions or concerns.  I spent 30 minutes counseling the patient face to face. The total time spent in the appointment was 40 minutes and more than 50% was on counseling and direct patient cares.    Sullivan Lone MD [place] [facility] Hematology/Oncology Physician [facility]  (Office):       [phone] (Work cell):  [phone] (Fax):           [phone]   I, Melene Muller, am acting as scribe for Fabienne Bruns, MD.   .I have reviewed the above documentation for accuracy and completeness, and I agree with the above.  Brunetta Genera MD

## 2022-02-09 ENCOUNTER — Telehealth: Payer: Self-pay | Admitting: Hematology

## 2022-02-09 NOTE — Telephone Encounter (Signed)
Scheduled per 5/24 los, pt has been called and confirmed  

## 2022-02-17 DIAGNOSIS — M150 Primary generalized (osteo)arthritis: Secondary | ICD-10-CM | POA: Diagnosis not present

## 2022-02-17 DIAGNOSIS — M6281 Muscle weakness (generalized): Secondary | ICD-10-CM | POA: Diagnosis not present

## 2022-02-17 DIAGNOSIS — R278 Other lack of coordination: Secondary | ICD-10-CM | POA: Diagnosis not present

## 2022-02-17 DIAGNOSIS — R262 Difficulty in walking, not elsewhere classified: Secondary | ICD-10-CM | POA: Diagnosis not present

## 2022-02-17 DIAGNOSIS — I69854 Hemiplegia and hemiparesis following other cerebrovascular disease affecting left non-dominant side: Secondary | ICD-10-CM | POA: Diagnosis not present

## 2022-02-22 DIAGNOSIS — F39 Unspecified mood [affective] disorder: Secondary | ICD-10-CM | POA: Diagnosis not present

## 2022-02-22 DIAGNOSIS — F419 Anxiety disorder, unspecified: Secondary | ICD-10-CM | POA: Diagnosis not present

## 2022-03-08 DIAGNOSIS — G479 Sleep disorder, unspecified: Secondary | ICD-10-CM | POA: Diagnosis not present

## 2022-03-08 DIAGNOSIS — F419 Anxiety disorder, unspecified: Secondary | ICD-10-CM | POA: Diagnosis not present

## 2022-03-08 DIAGNOSIS — F39 Unspecified mood [affective] disorder: Secondary | ICD-10-CM | POA: Diagnosis not present

## 2022-03-09 DIAGNOSIS — F330 Major depressive disorder, recurrent, mild: Secondary | ICD-10-CM | POA: Diagnosis not present

## 2022-03-09 DIAGNOSIS — F419 Anxiety disorder, unspecified: Secondary | ICD-10-CM | POA: Diagnosis not present

## 2022-03-29 DIAGNOSIS — F419 Anxiety disorder, unspecified: Secondary | ICD-10-CM | POA: Diagnosis not present

## 2022-03-29 DIAGNOSIS — F39 Unspecified mood [affective] disorder: Secondary | ICD-10-CM | POA: Diagnosis not present

## 2022-03-30 DIAGNOSIS — F419 Anxiety disorder, unspecified: Secondary | ICD-10-CM | POA: Diagnosis not present

## 2022-03-30 DIAGNOSIS — F39 Unspecified mood [affective] disorder: Secondary | ICD-10-CM | POA: Diagnosis not present

## 2022-03-30 DIAGNOSIS — G479 Sleep disorder, unspecified: Secondary | ICD-10-CM | POA: Diagnosis not present

## 2022-04-03 DIAGNOSIS — F39 Unspecified mood [affective] disorder: Secondary | ICD-10-CM | POA: Diagnosis not present

## 2022-04-03 DIAGNOSIS — Z79899 Other long term (current) drug therapy: Secondary | ICD-10-CM | POA: Diagnosis not present

## 2022-04-03 DIAGNOSIS — G479 Sleep disorder, unspecified: Secondary | ICD-10-CM | POA: Diagnosis not present

## 2022-04-03 DIAGNOSIS — F419 Anxiety disorder, unspecified: Secondary | ICD-10-CM | POA: Diagnosis not present

## 2022-04-05 DIAGNOSIS — H35423 Microcystoid degeneration of retina, bilateral: Secondary | ICD-10-CM | POA: Diagnosis not present

## 2022-04-05 DIAGNOSIS — H353213 Exudative age-related macular degeneration, right eye, with inactive scar: Secondary | ICD-10-CM | POA: Diagnosis not present

## 2022-04-05 DIAGNOSIS — H43813 Vitreous degeneration, bilateral: Secondary | ICD-10-CM | POA: Diagnosis not present

## 2022-04-05 DIAGNOSIS — H353221 Exudative age-related macular degeneration, left eye, with active choroidal neovascularization: Secondary | ICD-10-CM | POA: Diagnosis not present

## 2022-04-07 DIAGNOSIS — R269 Unspecified abnormalities of gait and mobility: Secondary | ICD-10-CM | POA: Diagnosis not present

## 2022-04-07 DIAGNOSIS — E538 Deficiency of other specified B group vitamins: Secondary | ICD-10-CM | POA: Diagnosis not present

## 2022-04-07 DIAGNOSIS — I10 Essential (primary) hypertension: Secondary | ICD-10-CM | POA: Diagnosis not present

## 2022-04-07 DIAGNOSIS — E039 Hypothyroidism, unspecified: Secondary | ICD-10-CM | POA: Diagnosis not present

## 2022-04-11 DIAGNOSIS — F419 Anxiety disorder, unspecified: Secondary | ICD-10-CM | POA: Diagnosis not present

## 2022-04-11 DIAGNOSIS — F39 Unspecified mood [affective] disorder: Secondary | ICD-10-CM | POA: Diagnosis not present

## 2022-04-18 DIAGNOSIS — F3341 Major depressive disorder, recurrent, in partial remission: Secondary | ICD-10-CM | POA: Diagnosis not present

## 2022-04-18 DIAGNOSIS — C9510 Chronic leukemia of unspecified cell type not having achieved remission: Secondary | ICD-10-CM | POA: Diagnosis not present

## 2022-04-18 DIAGNOSIS — I10 Essential (primary) hypertension: Secondary | ICD-10-CM | POA: Diagnosis not present

## 2022-04-18 DIAGNOSIS — E039 Hypothyroidism, unspecified: Secondary | ICD-10-CM | POA: Diagnosis not present

## 2022-04-25 DIAGNOSIS — E538 Deficiency of other specified B group vitamins: Secondary | ICD-10-CM | POA: Diagnosis not present

## 2022-04-25 DIAGNOSIS — M1990 Unspecified osteoarthritis, unspecified site: Secondary | ICD-10-CM | POA: Diagnosis not present

## 2022-04-25 DIAGNOSIS — R5381 Other malaise: Secondary | ICD-10-CM | POA: Diagnosis not present

## 2022-04-25 DIAGNOSIS — E119 Type 2 diabetes mellitus without complications: Secondary | ICD-10-CM | POA: Diagnosis not present

## 2022-04-25 DIAGNOSIS — K219 Gastro-esophageal reflux disease without esophagitis: Secondary | ICD-10-CM | POA: Diagnosis not present

## 2022-04-26 DIAGNOSIS — F39 Unspecified mood [affective] disorder: Secondary | ICD-10-CM | POA: Diagnosis not present

## 2022-04-26 DIAGNOSIS — F419 Anxiety disorder, unspecified: Secondary | ICD-10-CM | POA: Diagnosis not present

## 2022-04-27 DIAGNOSIS — F39 Unspecified mood [affective] disorder: Secondary | ICD-10-CM | POA: Diagnosis not present

## 2022-04-27 DIAGNOSIS — F419 Anxiety disorder, unspecified: Secondary | ICD-10-CM | POA: Diagnosis not present

## 2022-04-27 DIAGNOSIS — G479 Sleep disorder, unspecified: Secondary | ICD-10-CM | POA: Diagnosis not present

## 2022-04-27 DIAGNOSIS — N390 Urinary tract infection, site not specified: Secondary | ICD-10-CM | POA: Diagnosis not present

## 2022-04-28 DIAGNOSIS — N390 Urinary tract infection, site not specified: Secondary | ICD-10-CM | POA: Diagnosis not present

## 2022-05-02 DIAGNOSIS — R238 Other skin changes: Secondary | ICD-10-CM | POA: Diagnosis not present

## 2022-05-02 DIAGNOSIS — R899 Unspecified abnormal finding in specimens from other organs, systems and tissues: Secondary | ICD-10-CM | POA: Diagnosis not present

## 2022-05-02 DIAGNOSIS — N390 Urinary tract infection, site not specified: Secondary | ICD-10-CM | POA: Diagnosis not present

## 2022-05-04 DIAGNOSIS — E785 Hyperlipidemia, unspecified: Secondary | ICD-10-CM | POA: Diagnosis not present

## 2022-05-04 DIAGNOSIS — Z7982 Long term (current) use of aspirin: Secondary | ICD-10-CM | POA: Diagnosis not present

## 2022-05-04 DIAGNOSIS — I69354 Hemiplegia and hemiparesis following cerebral infarction affecting left non-dominant side: Secondary | ICD-10-CM | POA: Diagnosis not present

## 2022-05-04 DIAGNOSIS — I10 Essential (primary) hypertension: Secondary | ICD-10-CM | POA: Diagnosis not present

## 2022-05-08 ENCOUNTER — Encounter: Payer: Self-pay | Admitting: *Deleted

## 2022-05-08 NOTE — Patient Outreach (Signed)
  Care Coordination   05/08/2022 Name: CLELIA TRABUCCO MRN: [id number] DOB: [birth year]   Care Coordination Outreach Attempts:  An unsuccessful telephone outreach was attempted today to offer the patient information about available care coordination services as a benefit of their health plan.   Follow Up Plan:  No further outreach attempts will be made at this time. We have been unable to contact the patient to offer or enroll patient in care coordination services PT RESIDES IN AN ALF NOT ELIGIBLE.  Encounter Outcome:  No Answer  Care Coordination Interventions Activated:  No   Care Coordination Interventions:  No, not indicated    SIG Cletus Mehlhoff C. Myrtie Neither, MSN, [facility] Gerontological Nurse Practitioner [facility] Care Management [phone]

## 2022-05-09 DIAGNOSIS — I10 Essential (primary) hypertension: Secondary | ICD-10-CM | POA: Diagnosis not present

## 2022-05-10 DIAGNOSIS — F39 Unspecified mood [affective] disorder: Secondary | ICD-10-CM | POA: Diagnosis not present

## 2022-05-10 DIAGNOSIS — F419 Anxiety disorder, unspecified: Secondary | ICD-10-CM | POA: Diagnosis not present

## 2022-05-23 DIAGNOSIS — I10 Essential (primary) hypertension: Secondary | ICD-10-CM | POA: Diagnosis not present

## 2022-05-23 DIAGNOSIS — E785 Hyperlipidemia, unspecified: Secondary | ICD-10-CM | POA: Diagnosis not present

## 2022-05-23 DIAGNOSIS — Z7982 Long term (current) use of aspirin: Secondary | ICD-10-CM | POA: Diagnosis not present

## 2022-05-23 DIAGNOSIS — I69354 Hemiplegia and hemiparesis following cerebral infarction affecting left non-dominant side: Secondary | ICD-10-CM | POA: Diagnosis not present

## 2022-05-24 DIAGNOSIS — F419 Anxiety disorder, unspecified: Secondary | ICD-10-CM | POA: Diagnosis not present

## 2022-05-24 DIAGNOSIS — F39 Unspecified mood [affective] disorder: Secondary | ICD-10-CM | POA: Diagnosis not present

## 2022-05-26 DIAGNOSIS — F39 Unspecified mood [affective] disorder: Secondary | ICD-10-CM | POA: Diagnosis not present

## 2022-05-26 DIAGNOSIS — F419 Anxiety disorder, unspecified: Secondary | ICD-10-CM | POA: Diagnosis not present

## 2022-05-26 DIAGNOSIS — G479 Sleep disorder, unspecified: Secondary | ICD-10-CM | POA: Diagnosis not present

## 2022-05-30 DIAGNOSIS — M1990 Unspecified osteoarthritis, unspecified site: Secondary | ICD-10-CM | POA: Diagnosis not present

## 2022-05-30 DIAGNOSIS — R5381 Other malaise: Secondary | ICD-10-CM | POA: Diagnosis not present

## 2022-05-30 DIAGNOSIS — M792 Neuralgia and neuritis, unspecified: Secondary | ICD-10-CM | POA: Diagnosis not present

## 2022-05-30 DIAGNOSIS — E119 Type 2 diabetes mellitus without complications: Secondary | ICD-10-CM | POA: Diagnosis not present

## 2022-06-01 DIAGNOSIS — F419 Anxiety disorder, unspecified: Secondary | ICD-10-CM | POA: Diagnosis not present

## 2022-06-01 DIAGNOSIS — G479 Sleep disorder, unspecified: Secondary | ICD-10-CM | POA: Diagnosis not present

## 2022-06-01 DIAGNOSIS — F39 Unspecified mood [affective] disorder: Secondary | ICD-10-CM | POA: Diagnosis not present

## 2022-06-06 DIAGNOSIS — F419 Anxiety disorder, unspecified: Secondary | ICD-10-CM | POA: Diagnosis not present

## 2022-06-06 DIAGNOSIS — F39 Unspecified mood [affective] disorder: Secondary | ICD-10-CM | POA: Diagnosis not present

## 2022-06-12 DIAGNOSIS — E785 Hyperlipidemia, unspecified: Secondary | ICD-10-CM | POA: Diagnosis not present

## 2022-06-12 DIAGNOSIS — Z7982 Long term (current) use of aspirin: Secondary | ICD-10-CM | POA: Diagnosis not present

## 2022-06-12 DIAGNOSIS — I10 Essential (primary) hypertension: Secondary | ICD-10-CM | POA: Diagnosis not present

## 2022-06-12 DIAGNOSIS — I69354 Hemiplegia and hemiparesis following cerebral infarction affecting left non-dominant side: Secondary | ICD-10-CM | POA: Diagnosis not present

## 2022-06-14 DIAGNOSIS — H35423 Microcystoid degeneration of retina, bilateral: Secondary | ICD-10-CM | POA: Diagnosis not present

## 2022-06-14 DIAGNOSIS — H353213 Exudative age-related macular degeneration, right eye, with inactive scar: Secondary | ICD-10-CM | POA: Diagnosis not present

## 2022-06-14 DIAGNOSIS — H353221 Exudative age-related macular degeneration, left eye, with active choroidal neovascularization: Secondary | ICD-10-CM | POA: Diagnosis not present

## 2022-06-14 DIAGNOSIS — H43813 Vitreous degeneration, bilateral: Secondary | ICD-10-CM | POA: Diagnosis not present

## 2022-06-19 DIAGNOSIS — Z7982 Long term (current) use of aspirin: Secondary | ICD-10-CM | POA: Diagnosis not present

## 2022-06-19 DIAGNOSIS — F5105 Insomnia due to other mental disorder: Secondary | ICD-10-CM | POA: Diagnosis not present

## 2022-06-19 DIAGNOSIS — Z79899 Other long term (current) drug therapy: Secondary | ICD-10-CM | POA: Diagnosis not present

## 2022-06-19 DIAGNOSIS — F39 Unspecified mood [affective] disorder: Secondary | ICD-10-CM | POA: Diagnosis not present

## 2022-06-19 DIAGNOSIS — I10 Essential (primary) hypertension: Secondary | ICD-10-CM | POA: Diagnosis not present

## 2022-06-19 DIAGNOSIS — F03918 Unspecified dementia, unspecified severity, with other behavioral disturbance: Secondary | ICD-10-CM | POA: Diagnosis not present

## 2022-06-19 DIAGNOSIS — I69354 Hemiplegia and hemiparesis following cerebral infarction affecting left non-dominant side: Secondary | ICD-10-CM | POA: Diagnosis not present

## 2022-06-19 DIAGNOSIS — E785 Hyperlipidemia, unspecified: Secondary | ICD-10-CM | POA: Diagnosis not present

## 2022-06-19 DIAGNOSIS — F419 Anxiety disorder, unspecified: Secondary | ICD-10-CM | POA: Diagnosis not present

## 2022-06-20 DIAGNOSIS — R8290 Unspecified abnormal findings in urine: Secondary | ICD-10-CM | POA: Diagnosis not present

## 2022-06-21 DIAGNOSIS — Z7982 Long term (current) use of aspirin: Secondary | ICD-10-CM | POA: Diagnosis not present

## 2022-06-21 DIAGNOSIS — E785 Hyperlipidemia, unspecified: Secondary | ICD-10-CM | POA: Diagnosis not present

## 2022-06-21 DIAGNOSIS — F419 Anxiety disorder, unspecified: Secondary | ICD-10-CM | POA: Diagnosis not present

## 2022-06-21 DIAGNOSIS — I69354 Hemiplegia and hemiparesis following cerebral infarction affecting left non-dominant side: Secondary | ICD-10-CM | POA: Diagnosis not present

## 2022-06-21 DIAGNOSIS — I10 Essential (primary) hypertension: Secondary | ICD-10-CM | POA: Diagnosis not present

## 2022-06-21 DIAGNOSIS — F39 Unspecified mood [affective] disorder: Secondary | ICD-10-CM | POA: Diagnosis not present

## 2022-06-26 DIAGNOSIS — Z888 Allergy status to other drugs, medicaments and biological substances status: Secondary | ICD-10-CM | POA: Diagnosis not present

## 2022-06-26 DIAGNOSIS — R0789 Other chest pain: Secondary | ICD-10-CM | POA: Diagnosis not present

## 2022-06-26 DIAGNOSIS — T17920A Food in respiratory tract, part unspecified causing asphyxiation, initial encounter: Secondary | ICD-10-CM | POA: Diagnosis not present

## 2022-06-26 DIAGNOSIS — Z882 Allergy status to sulfonamides status: Secondary | ICD-10-CM | POA: Diagnosis not present

## 2022-06-26 DIAGNOSIS — S299XXA Unspecified injury of thorax, initial encounter: Secondary | ICD-10-CM | POA: Diagnosis not present

## 2022-06-27 DIAGNOSIS — R1310 Dysphagia, unspecified: Secondary | ICD-10-CM | POA: Diagnosis not present

## 2022-06-27 DIAGNOSIS — F3341 Major depressive disorder, recurrent, in partial remission: Secondary | ICD-10-CM | POA: Diagnosis not present

## 2022-06-27 DIAGNOSIS — C9510 Chronic leukemia of unspecified cell type not having achieved remission: Secondary | ICD-10-CM | POA: Diagnosis not present

## 2022-06-27 DIAGNOSIS — J4520 Mild intermittent asthma, uncomplicated: Secondary | ICD-10-CM | POA: Diagnosis not present

## 2022-06-29 DIAGNOSIS — Z7982 Long term (current) use of aspirin: Secondary | ICD-10-CM | POA: Diagnosis not present

## 2022-06-29 DIAGNOSIS — I69354 Hemiplegia and hemiparesis following cerebral infarction affecting left non-dominant side: Secondary | ICD-10-CM | POA: Diagnosis not present

## 2022-06-29 DIAGNOSIS — E785 Hyperlipidemia, unspecified: Secondary | ICD-10-CM | POA: Diagnosis not present

## 2022-06-29 DIAGNOSIS — I10 Essential (primary) hypertension: Secondary | ICD-10-CM | POA: Diagnosis not present

## 2022-07-10 DIAGNOSIS — R059 Cough, unspecified: Secondary | ICD-10-CM | POA: Diagnosis not present

## 2022-07-11 DIAGNOSIS — R0689 Other abnormalities of breathing: Secondary | ICD-10-CM | POA: Diagnosis not present

## 2022-07-12 DIAGNOSIS — E785 Hyperlipidemia, unspecified: Secondary | ICD-10-CM | POA: Diagnosis not present

## 2022-07-12 DIAGNOSIS — Z7982 Long term (current) use of aspirin: Secondary | ICD-10-CM | POA: Diagnosis not present

## 2022-07-12 DIAGNOSIS — I69354 Hemiplegia and hemiparesis following cerebral infarction affecting left non-dominant side: Secondary | ICD-10-CM | POA: Diagnosis not present

## 2022-07-12 DIAGNOSIS — I10 Essential (primary) hypertension: Secondary | ICD-10-CM | POA: Diagnosis not present

## 2022-07-12 DIAGNOSIS — M6289 Other specified disorders of muscle: Secondary | ICD-10-CM | POA: Diagnosis not present

## 2022-07-13 DIAGNOSIS — R8290 Unspecified abnormal findings in urine: Secondary | ICD-10-CM | POA: Diagnosis not present

## 2022-07-20 DIAGNOSIS — F419 Anxiety disorder, unspecified: Secondary | ICD-10-CM | POA: Diagnosis not present

## 2022-07-20 DIAGNOSIS — F330 Major depressive disorder, recurrent, mild: Secondary | ICD-10-CM | POA: Diagnosis not present

## 2022-07-20 DIAGNOSIS — E663 Overweight: Secondary | ICD-10-CM | POA: Diagnosis not present

## 2022-07-22 DIAGNOSIS — R8290 Unspecified abnormal findings in urine: Secondary | ICD-10-CM | POA: Diagnosis not present

## 2022-07-24 DIAGNOSIS — F39 Unspecified mood [affective] disorder: Secondary | ICD-10-CM | POA: Diagnosis not present

## 2022-07-24 DIAGNOSIS — Z79899 Other long term (current) drug therapy: Secondary | ICD-10-CM | POA: Diagnosis not present

## 2022-07-24 DIAGNOSIS — F03918 Unspecified dementia, unspecified severity, with other behavioral disturbance: Secondary | ICD-10-CM | POA: Diagnosis not present

## 2022-07-24 DIAGNOSIS — F5105 Insomnia due to other mental disorder: Secondary | ICD-10-CM | POA: Diagnosis not present

## 2022-07-24 DIAGNOSIS — F419 Anxiety disorder, unspecified: Secondary | ICD-10-CM | POA: Diagnosis not present

## 2022-08-01 DIAGNOSIS — N390 Urinary tract infection, site not specified: Secondary | ICD-10-CM | POA: Diagnosis not present

## 2022-08-01 DIAGNOSIS — B952 Enterococcus as the cause of diseases classified elsewhere: Secondary | ICD-10-CM | POA: Diagnosis not present

## 2022-08-03 DIAGNOSIS — I69354 Hemiplegia and hemiparesis following cerebral infarction affecting left non-dominant side: Secondary | ICD-10-CM | POA: Diagnosis not present

## 2022-08-03 DIAGNOSIS — Z7982 Long term (current) use of aspirin: Secondary | ICD-10-CM | POA: Diagnosis not present

## 2022-08-03 DIAGNOSIS — I10 Essential (primary) hypertension: Secondary | ICD-10-CM | POA: Diagnosis not present

## 2022-08-03 DIAGNOSIS — E785 Hyperlipidemia, unspecified: Secondary | ICD-10-CM | POA: Diagnosis not present

## 2022-08-03 DIAGNOSIS — M6289 Other specified disorders of muscle: Secondary | ICD-10-CM | POA: Diagnosis not present

## 2022-08-08 DIAGNOSIS — D72829 Elevated white blood cell count, unspecified: Secondary | ICD-10-CM | POA: Diagnosis not present

## 2022-08-08 DIAGNOSIS — D509 Iron deficiency anemia, unspecified: Secondary | ICD-10-CM | POA: Diagnosis not present

## 2022-08-14 DIAGNOSIS — I10 Essential (primary) hypertension: Secondary | ICD-10-CM | POA: Diagnosis not present

## 2022-08-14 DIAGNOSIS — M6289 Other specified disorders of muscle: Secondary | ICD-10-CM | POA: Diagnosis not present

## 2022-08-14 DIAGNOSIS — I69354 Hemiplegia and hemiparesis following cerebral infarction affecting left non-dominant side: Secondary | ICD-10-CM | POA: Diagnosis not present

## 2022-08-14 DIAGNOSIS — E785 Hyperlipidemia, unspecified: Secondary | ICD-10-CM | POA: Diagnosis not present

## 2022-08-14 DIAGNOSIS — Z7982 Long term (current) use of aspirin: Secondary | ICD-10-CM | POA: Diagnosis not present

## 2022-08-15 DIAGNOSIS — D72829 Elevated white blood cell count, unspecified: Secondary | ICD-10-CM | POA: Diagnosis not present

## 2022-08-15 DIAGNOSIS — N390 Urinary tract infection, site not specified: Secondary | ICD-10-CM | POA: Diagnosis not present

## 2022-08-16 DIAGNOSIS — Z7982 Long term (current) use of aspirin: Secondary | ICD-10-CM | POA: Diagnosis not present

## 2022-08-16 DIAGNOSIS — I10 Essential (primary) hypertension: Secondary | ICD-10-CM | POA: Diagnosis not present

## 2022-08-16 DIAGNOSIS — F419 Anxiety disorder, unspecified: Secondary | ICD-10-CM | POA: Diagnosis not present

## 2022-08-16 DIAGNOSIS — E663 Overweight: Secondary | ICD-10-CM | POA: Diagnosis not present

## 2022-08-16 DIAGNOSIS — M6289 Other specified disorders of muscle: Secondary | ICD-10-CM | POA: Diagnosis not present

## 2022-08-16 DIAGNOSIS — F330 Major depressive disorder, recurrent, mild: Secondary | ICD-10-CM | POA: Diagnosis not present

## 2022-08-16 DIAGNOSIS — E785 Hyperlipidemia, unspecified: Secondary | ICD-10-CM | POA: Diagnosis not present

## 2022-08-16 DIAGNOSIS — I69354 Hemiplegia and hemiparesis following cerebral infarction affecting left non-dominant side: Secondary | ICD-10-CM | POA: Diagnosis not present

## 2022-08-18 DIAGNOSIS — R8290 Unspecified abnormal findings in urine: Secondary | ICD-10-CM | POA: Diagnosis not present

## 2022-08-21 DIAGNOSIS — Z7982 Long term (current) use of aspirin: Secondary | ICD-10-CM | POA: Diagnosis not present

## 2022-08-21 DIAGNOSIS — I10 Essential (primary) hypertension: Secondary | ICD-10-CM | POA: Diagnosis not present

## 2022-08-21 DIAGNOSIS — E785 Hyperlipidemia, unspecified: Secondary | ICD-10-CM | POA: Diagnosis not present

## 2022-08-21 DIAGNOSIS — I69354 Hemiplegia and hemiparesis following cerebral infarction affecting left non-dominant side: Secondary | ICD-10-CM | POA: Diagnosis not present

## 2022-08-21 DIAGNOSIS — M6289 Other specified disorders of muscle: Secondary | ICD-10-CM | POA: Diagnosis not present

## 2022-08-22 DIAGNOSIS — Z66 Do not resuscitate: Secondary | ICD-10-CM | POA: Diagnosis not present

## 2022-08-22 DIAGNOSIS — N3281 Overactive bladder: Secondary | ICD-10-CM | POA: Diagnosis not present

## 2022-08-24 DIAGNOSIS — E785 Hyperlipidemia, unspecified: Secondary | ICD-10-CM | POA: Diagnosis not present

## 2022-08-24 DIAGNOSIS — F39 Unspecified mood [affective] disorder: Secondary | ICD-10-CM | POA: Diagnosis not present

## 2022-08-24 DIAGNOSIS — Z79899 Other long term (current) drug therapy: Secondary | ICD-10-CM | POA: Diagnosis not present

## 2022-08-24 DIAGNOSIS — F03918 Unspecified dementia, unspecified severity, with other behavioral disturbance: Secondary | ICD-10-CM | POA: Diagnosis not present

## 2022-08-24 DIAGNOSIS — F5105 Insomnia due to other mental disorder: Secondary | ICD-10-CM | POA: Diagnosis not present

## 2022-08-24 DIAGNOSIS — I69354 Hemiplegia and hemiparesis following cerebral infarction affecting left non-dominant side: Secondary | ICD-10-CM | POA: Diagnosis not present

## 2022-08-24 DIAGNOSIS — M6289 Other specified disorders of muscle: Secondary | ICD-10-CM | POA: Diagnosis not present

## 2022-08-24 DIAGNOSIS — Z7982 Long term (current) use of aspirin: Secondary | ICD-10-CM | POA: Diagnosis not present

## 2022-08-24 DIAGNOSIS — I10 Essential (primary) hypertension: Secondary | ICD-10-CM | POA: Diagnosis not present

## 2022-08-24 DIAGNOSIS — F419 Anxiety disorder, unspecified: Secondary | ICD-10-CM | POA: Diagnosis not present

## 2022-08-28 DIAGNOSIS — E785 Hyperlipidemia, unspecified: Secondary | ICD-10-CM | POA: Diagnosis not present

## 2022-08-28 DIAGNOSIS — I69354 Hemiplegia and hemiparesis following cerebral infarction affecting left non-dominant side: Secondary | ICD-10-CM | POA: Diagnosis not present

## 2022-08-28 DIAGNOSIS — I10 Essential (primary) hypertension: Secondary | ICD-10-CM | POA: Diagnosis not present

## 2022-08-28 DIAGNOSIS — Z7982 Long term (current) use of aspirin: Secondary | ICD-10-CM | POA: Diagnosis not present

## 2022-08-28 DIAGNOSIS — M6289 Other specified disorders of muscle: Secondary | ICD-10-CM | POA: Diagnosis not present

## 2022-08-29 DIAGNOSIS — N3281 Overactive bladder: Secondary | ICD-10-CM | POA: Diagnosis not present

## 2022-08-30 DIAGNOSIS — H43813 Vitreous degeneration, bilateral: Secondary | ICD-10-CM | POA: Diagnosis not present

## 2022-08-30 DIAGNOSIS — F419 Anxiety disorder, unspecified: Secondary | ICD-10-CM | POA: Diagnosis not present

## 2022-08-30 DIAGNOSIS — H353221 Exudative age-related macular degeneration, left eye, with active choroidal neovascularization: Secondary | ICD-10-CM | POA: Diagnosis not present

## 2022-08-30 DIAGNOSIS — H353213 Exudative age-related macular degeneration, right eye, with inactive scar: Secondary | ICD-10-CM | POA: Diagnosis not present

## 2022-08-30 DIAGNOSIS — F39 Unspecified mood [affective] disorder: Secondary | ICD-10-CM | POA: Diagnosis not present

## 2022-08-31 DIAGNOSIS — E785 Hyperlipidemia, unspecified: Secondary | ICD-10-CM | POA: Diagnosis not present

## 2022-08-31 DIAGNOSIS — M6289 Other specified disorders of muscle: Secondary | ICD-10-CM | POA: Diagnosis not present

## 2022-08-31 DIAGNOSIS — I69354 Hemiplegia and hemiparesis following cerebral infarction affecting left non-dominant side: Secondary | ICD-10-CM | POA: Diagnosis not present

## 2022-08-31 DIAGNOSIS — Z7982 Long term (current) use of aspirin: Secondary | ICD-10-CM | POA: Diagnosis not present

## 2022-08-31 DIAGNOSIS — I10 Essential (primary) hypertension: Secondary | ICD-10-CM | POA: Diagnosis not present

## 2022-09-12 DIAGNOSIS — M792 Neuralgia and neuritis, unspecified: Secondary | ICD-10-CM | POA: Diagnosis not present

## 2022-09-12 DIAGNOSIS — C9510 Chronic leukemia of unspecified cell type not having achieved remission: Secondary | ICD-10-CM | POA: Diagnosis not present

## 2022-09-12 DIAGNOSIS — I129 Hypertensive chronic kidney disease with stage 1 through stage 4 chronic kidney disease, or unspecified chronic kidney disease: Secondary | ICD-10-CM | POA: Diagnosis not present

## 2022-09-12 DIAGNOSIS — N182 Chronic kidney disease, stage 2 (mild): Secondary | ICD-10-CM | POA: Diagnosis not present

## 2022-09-21 DIAGNOSIS — F03918 Unspecified dementia, unspecified severity, with other behavioral disturbance: Secondary | ICD-10-CM | POA: Diagnosis not present

## 2022-09-21 DIAGNOSIS — F39 Unspecified mood [affective] disorder: Secondary | ICD-10-CM | POA: Diagnosis not present

## 2022-09-21 DIAGNOSIS — F419 Anxiety disorder, unspecified: Secondary | ICD-10-CM | POA: Diagnosis not present

## 2022-09-21 DIAGNOSIS — F5105 Insomnia due to other mental disorder: Secondary | ICD-10-CM | POA: Diagnosis not present

## 2022-09-21 DIAGNOSIS — Z79899 Other long term (current) drug therapy: Secondary | ICD-10-CM | POA: Diagnosis not present

## 2022-09-25 DIAGNOSIS — F419 Anxiety disorder, unspecified: Secondary | ICD-10-CM | POA: Diagnosis not present

## 2022-09-25 DIAGNOSIS — Z79899 Other long term (current) drug therapy: Secondary | ICD-10-CM | POA: Diagnosis not present

## 2022-09-25 DIAGNOSIS — F5105 Insomnia due to other mental disorder: Secondary | ICD-10-CM | POA: Diagnosis not present

## 2022-09-25 DIAGNOSIS — F39 Unspecified mood [affective] disorder: Secondary | ICD-10-CM | POA: Diagnosis not present

## 2022-09-26 DIAGNOSIS — F419 Anxiety disorder, unspecified: Secondary | ICD-10-CM | POA: Diagnosis not present

## 2022-09-26 DIAGNOSIS — C9510 Chronic leukemia of unspecified cell type not having achieved remission: Secondary | ICD-10-CM | POA: Diagnosis not present

## 2022-09-27 DIAGNOSIS — F419 Anxiety disorder, unspecified: Secondary | ICD-10-CM | POA: Diagnosis not present

## 2022-09-27 DIAGNOSIS — F330 Major depressive disorder, recurrent, mild: Secondary | ICD-10-CM | POA: Diagnosis not present

## 2022-10-06 DIAGNOSIS — H3530 Unspecified macular degeneration: Secondary | ICD-10-CM | POA: Diagnosis not present

## 2022-10-06 DIAGNOSIS — F1320 Sedative, hypnotic or anxiolytic dependence, uncomplicated: Secondary | ICD-10-CM | POA: Diagnosis not present

## 2022-10-06 DIAGNOSIS — F39 Unspecified mood [affective] disorder: Secondary | ICD-10-CM | POA: Diagnosis not present

## 2022-10-06 DIAGNOSIS — R634 Abnormal weight loss: Secondary | ICD-10-CM | POA: Diagnosis not present

## 2022-10-11 DIAGNOSIS — F419 Anxiety disorder, unspecified: Secondary | ICD-10-CM | POA: Diagnosis not present

## 2022-10-11 DIAGNOSIS — E663 Overweight: Secondary | ICD-10-CM | POA: Diagnosis not present

## 2022-10-11 DIAGNOSIS — F330 Major depressive disorder, recurrent, mild: Secondary | ICD-10-CM | POA: Diagnosis not present

## 2022-10-19 DIAGNOSIS — R062 Wheezing: Secondary | ICD-10-CM | POA: Diagnosis not present

## 2022-10-19 DIAGNOSIS — R0602 Shortness of breath: Secondary | ICD-10-CM | POA: Diagnosis not present

## 2022-10-19 DIAGNOSIS — R059 Cough, unspecified: Secondary | ICD-10-CM | POA: Diagnosis not present

## 2022-10-24 DIAGNOSIS — R0981 Nasal congestion: Secondary | ICD-10-CM | POA: Diagnosis not present

## 2022-10-24 DIAGNOSIS — R058 Other specified cough: Secondary | ICD-10-CM | POA: Diagnosis not present

## 2022-10-24 DIAGNOSIS — R7989 Other specified abnormal findings of blood chemistry: Secondary | ICD-10-CM | POA: Diagnosis not present

## 2022-10-24 DIAGNOSIS — R8290 Unspecified abnormal findings in urine: Secondary | ICD-10-CM | POA: Diagnosis not present

## 2022-10-24 DIAGNOSIS — R946 Abnormal results of thyroid function studies: Secondary | ICD-10-CM | POA: Diagnosis not present

## 2022-10-24 DIAGNOSIS — R0989 Other specified symptoms and signs involving the circulatory and respiratory systems: Secondary | ICD-10-CM | POA: Diagnosis not present

## 2022-10-24 DIAGNOSIS — D519 Vitamin B12 deficiency anemia, unspecified: Secondary | ICD-10-CM | POA: Diagnosis not present

## 2022-10-26 DIAGNOSIS — G4700 Insomnia, unspecified: Secondary | ICD-10-CM | POA: Diagnosis not present

## 2022-10-26 DIAGNOSIS — F419 Anxiety disorder, unspecified: Secondary | ICD-10-CM | POA: Diagnosis not present

## 2022-10-31 DIAGNOSIS — D72829 Elevated white blood cell count, unspecified: Secondary | ICD-10-CM | POA: Diagnosis not present

## 2022-11-03 DIAGNOSIS — I119 Hypertensive heart disease without heart failure: Secondary | ICD-10-CM | POA: Diagnosis not present

## 2022-11-03 DIAGNOSIS — C9590 Leukemia, unspecified not having achieved remission: Secondary | ICD-10-CM | POA: Diagnosis not present

## 2022-11-03 DIAGNOSIS — E039 Hypothyroidism, unspecified: Secondary | ICD-10-CM | POA: Diagnosis not present

## 2022-11-03 DIAGNOSIS — E785 Hyperlipidemia, unspecified: Secondary | ICD-10-CM | POA: Diagnosis not present

## 2022-11-16 DIAGNOSIS — F331 Major depressive disorder, recurrent, moderate: Secondary | ICD-10-CM | POA: Diagnosis not present

## 2022-11-16 DIAGNOSIS — F419 Anxiety disorder, unspecified: Secondary | ICD-10-CM | POA: Diagnosis not present

## 2022-11-30 DIAGNOSIS — F419 Anxiety disorder, unspecified: Secondary | ICD-10-CM | POA: Diagnosis not present

## 2022-11-30 DIAGNOSIS — F331 Major depressive disorder, recurrent, moderate: Secondary | ICD-10-CM | POA: Diagnosis not present

## 2022-12-07 DIAGNOSIS — F331 Major depressive disorder, recurrent, moderate: Secondary | ICD-10-CM | POA: Diagnosis not present

## 2022-12-07 DIAGNOSIS — F419 Anxiety disorder, unspecified: Secondary | ICD-10-CM | POA: Diagnosis not present

## 2022-12-07 DIAGNOSIS — G4700 Insomnia, unspecified: Secondary | ICD-10-CM | POA: Diagnosis not present

## 2022-12-14 DIAGNOSIS — F331 Major depressive disorder, recurrent, moderate: Secondary | ICD-10-CM | POA: Diagnosis not present

## 2022-12-14 DIAGNOSIS — F419 Anxiety disorder, unspecified: Secondary | ICD-10-CM | POA: Diagnosis not present

## 2022-12-28 DIAGNOSIS — F419 Anxiety disorder, unspecified: Secondary | ICD-10-CM | POA: Diagnosis not present

## 2022-12-28 DIAGNOSIS — F331 Major depressive disorder, recurrent, moderate: Secondary | ICD-10-CM | POA: Diagnosis not present

## 2023-01-04 DIAGNOSIS — F331 Major depressive disorder, recurrent, moderate: Secondary | ICD-10-CM | POA: Diagnosis not present

## 2023-01-04 DIAGNOSIS — G4700 Insomnia, unspecified: Secondary | ICD-10-CM | POA: Diagnosis not present

## 2023-01-04 DIAGNOSIS — F419 Anxiety disorder, unspecified: Secondary | ICD-10-CM | POA: Diagnosis not present

## 2023-01-07 DIAGNOSIS — R442 Other hallucinations: Secondary | ICD-10-CM | POA: Diagnosis not present

## 2023-01-07 DIAGNOSIS — N3000 Acute cystitis without hematuria: Secondary | ICD-10-CM | POA: Diagnosis not present

## 2023-01-07 DIAGNOSIS — Z7982 Long term (current) use of aspirin: Secondary | ICD-10-CM | POA: Diagnosis not present

## 2023-01-07 DIAGNOSIS — Z853 Personal history of malignant neoplasm of breast: Secondary | ICD-10-CM | POA: Diagnosis not present

## 2023-01-07 DIAGNOSIS — Z90710 Acquired absence of both cervix and uterus: Secondary | ICD-10-CM | POA: Diagnosis not present

## 2023-01-07 DIAGNOSIS — J984 Other disorders of lung: Secondary | ICD-10-CM | POA: Diagnosis not present

## 2023-01-07 DIAGNOSIS — I10 Essential (primary) hypertension: Secondary | ICD-10-CM | POA: Diagnosis not present

## 2023-01-07 DIAGNOSIS — M1990 Unspecified osteoarthritis, unspecified site: Secondary | ICD-10-CM | POA: Diagnosis not present

## 2023-01-07 DIAGNOSIS — J479 Bronchiectasis, uncomplicated: Secondary | ICD-10-CM | POA: Diagnosis not present

## 2023-01-07 DIAGNOSIS — I2510 Atherosclerotic heart disease of native coronary artery without angina pectoris: Secondary | ICD-10-CM | POA: Diagnosis not present

## 2023-01-07 DIAGNOSIS — R440 Auditory hallucinations: Secondary | ICD-10-CM | POA: Diagnosis not present

## 2023-01-07 DIAGNOSIS — C9110 Chronic lymphocytic leukemia of B-cell type not having achieved remission: Secondary | ICD-10-CM | POA: Diagnosis not present

## 2023-01-07 DIAGNOSIS — Z66 Do not resuscitate: Secondary | ICD-10-CM | POA: Diagnosis not present

## 2023-01-07 DIAGNOSIS — R4182 Altered mental status, unspecified: Secondary | ICD-10-CM | POA: Diagnosis not present

## 2023-01-07 DIAGNOSIS — R0902 Hypoxemia: Secondary | ICD-10-CM | POA: Diagnosis not present

## 2023-01-07 DIAGNOSIS — R55 Syncope and collapse: Secondary | ICD-10-CM | POA: Diagnosis not present

## 2023-01-07 DIAGNOSIS — Z882 Allergy status to sulfonamides status: Secondary | ICD-10-CM | POA: Diagnosis not present

## 2023-01-07 DIAGNOSIS — R918 Other nonspecific abnormal finding of lung field: Secondary | ICD-10-CM | POA: Diagnosis not present

## 2023-01-07 DIAGNOSIS — H3530 Unspecified macular degeneration: Secondary | ICD-10-CM | POA: Diagnosis not present

## 2023-01-07 DIAGNOSIS — R441 Visual hallucinations: Secondary | ICD-10-CM | POA: Diagnosis not present

## 2023-01-07 DIAGNOSIS — J189 Pneumonia, unspecified organism: Secondary | ICD-10-CM | POA: Diagnosis not present

## 2023-01-07 DIAGNOSIS — G9340 Encephalopathy, unspecified: Secondary | ICD-10-CM | POA: Diagnosis not present

## 2023-01-07 DIAGNOSIS — Z884 Allergy status to anesthetic agent status: Secondary | ICD-10-CM | POA: Diagnosis not present

## 2023-01-07 DIAGNOSIS — I69353 Hemiplegia and hemiparesis following cerebral infarction affecting right non-dominant side: Secondary | ICD-10-CM | POA: Diagnosis not present

## 2023-01-07 DIAGNOSIS — Z79890 Hormone replacement therapy (postmenopausal): Secondary | ICD-10-CM | POA: Diagnosis not present

## 2023-01-07 DIAGNOSIS — I517 Cardiomegaly: Secondary | ICD-10-CM | POA: Diagnosis not present

## 2023-01-07 DIAGNOSIS — R54 Age-related physical debility: Secondary | ICD-10-CM | POA: Diagnosis not present

## 2023-01-07 DIAGNOSIS — Z1152 Encounter for screening for COVID-19: Secondary | ICD-10-CM | POA: Diagnosis not present

## 2023-01-07 DIAGNOSIS — R410 Disorientation, unspecified: Secondary | ICD-10-CM | POA: Diagnosis not present

## 2023-01-07 DIAGNOSIS — J9601 Acute respiratory failure with hypoxia: Secondary | ICD-10-CM | POA: Diagnosis not present

## 2023-01-07 DIAGNOSIS — M6281 Muscle weakness (generalized): Secondary | ICD-10-CM | POA: Diagnosis not present

## 2023-01-07 DIAGNOSIS — J9811 Atelectasis: Secondary | ICD-10-CM | POA: Diagnosis not present

## 2023-01-07 DIAGNOSIS — Z9660 Presence of unspecified orthopedic joint implant: Secondary | ICD-10-CM | POA: Diagnosis not present

## 2023-01-07 DIAGNOSIS — Z79899 Other long term (current) drug therapy: Secondary | ICD-10-CM | POA: Diagnosis not present

## 2023-01-08 ENCOUNTER — Telehealth: Payer: Self-pay | Admitting: Hematology

## 2023-01-08 NOTE — Telephone Encounter (Signed)
Patient's daughter called to cancel 4/24 appointments due to being hospitalized. Forwarding to RN to see how to reschedule.

## 2023-01-10 ENCOUNTER — Inpatient Hospital Stay: Payer: PPO

## 2023-01-10 ENCOUNTER — Inpatient Hospital Stay: Payer: PPO | Admitting: Hematology

## 2023-01-10 DIAGNOSIS — M6281 Muscle weakness (generalized): Secondary | ICD-10-CM | POA: Diagnosis not present

## 2023-01-12 DIAGNOSIS — H43813 Vitreous degeneration, bilateral: Secondary | ICD-10-CM | POA: Diagnosis not present

## 2023-01-12 DIAGNOSIS — H353233 Exudative age-related macular degeneration, bilateral, with inactive scar: Secondary | ICD-10-CM | POA: Diagnosis not present

## 2023-01-12 DIAGNOSIS — H04129 Dry eye syndrome of unspecified lacrimal gland: Secondary | ICD-10-CM | POA: Diagnosis not present

## 2023-01-15 DIAGNOSIS — R2689 Other abnormalities of gait and mobility: Secondary | ICD-10-CM | POA: Diagnosis not present

## 2023-01-16 DIAGNOSIS — R7989 Other specified abnormal findings of blood chemistry: Secondary | ICD-10-CM | POA: Diagnosis not present

## 2023-01-16 DIAGNOSIS — N182 Chronic kidney disease, stage 2 (mild): Secondary | ICD-10-CM | POA: Diagnosis not present

## 2023-01-16 DIAGNOSIS — C9590 Leukemia, unspecified not having achieved remission: Secondary | ICD-10-CM | POA: Diagnosis not present

## 2023-01-16 DIAGNOSIS — B372 Candidiasis of skin and nail: Secondary | ICD-10-CM | POA: Diagnosis not present

## 2023-01-16 DIAGNOSIS — M6281 Muscle weakness (generalized): Secondary | ICD-10-CM | POA: Diagnosis not present

## 2023-01-16 DIAGNOSIS — Z13228 Encounter for screening for other metabolic disorders: Secondary | ICD-10-CM | POA: Diagnosis not present

## 2023-01-16 DIAGNOSIS — C9510 Chronic leukemia of unspecified cell type not having achieved remission: Secondary | ICD-10-CM | POA: Diagnosis not present

## 2023-01-17 DIAGNOSIS — R2689 Other abnormalities of gait and mobility: Secondary | ICD-10-CM | POA: Diagnosis not present

## 2023-01-18 DIAGNOSIS — M6281 Muscle weakness (generalized): Secondary | ICD-10-CM | POA: Diagnosis not present

## 2023-01-22 DIAGNOSIS — R2689 Other abnormalities of gait and mobility: Secondary | ICD-10-CM | POA: Diagnosis not present

## 2023-01-23 DIAGNOSIS — M6281 Muscle weakness (generalized): Secondary | ICD-10-CM | POA: Diagnosis not present

## 2023-01-23 DIAGNOSIS — I10 Essential (primary) hypertension: Secondary | ICD-10-CM | POA: Diagnosis not present

## 2023-01-24 DIAGNOSIS — R2689 Other abnormalities of gait and mobility: Secondary | ICD-10-CM | POA: Diagnosis not present

## 2023-01-26 DIAGNOSIS — M6281 Muscle weakness (generalized): Secondary | ICD-10-CM | POA: Diagnosis not present

## 2023-01-29 DIAGNOSIS — R2689 Other abnormalities of gait and mobility: Secondary | ICD-10-CM | POA: Diagnosis not present

## 2023-01-30 DIAGNOSIS — M6281 Muscle weakness (generalized): Secondary | ICD-10-CM | POA: Diagnosis not present

## 2023-01-31 DIAGNOSIS — R2689 Other abnormalities of gait and mobility: Secondary | ICD-10-CM | POA: Diagnosis not present

## 2023-02-01 DIAGNOSIS — G4700 Insomnia, unspecified: Secondary | ICD-10-CM | POA: Diagnosis not present

## 2023-02-01 DIAGNOSIS — F331 Major depressive disorder, recurrent, moderate: Secondary | ICD-10-CM | POA: Diagnosis not present

## 2023-02-01 DIAGNOSIS — F419 Anxiety disorder, unspecified: Secondary | ICD-10-CM | POA: Diagnosis not present

## 2023-02-02 DIAGNOSIS — M6281 Muscle weakness (generalized): Secondary | ICD-10-CM | POA: Diagnosis not present

## 2023-02-07 DIAGNOSIS — N390 Urinary tract infection, site not specified: Secondary | ICD-10-CM | POA: Diagnosis not present

## 2023-02-08 DIAGNOSIS — M6281 Muscle weakness (generalized): Secondary | ICD-10-CM | POA: Diagnosis not present

## 2023-02-09 DIAGNOSIS — M6281 Muscle weakness (generalized): Secondary | ICD-10-CM | POA: Diagnosis not present

## 2023-02-12 DIAGNOSIS — R2689 Other abnormalities of gait and mobility: Secondary | ICD-10-CM | POA: Diagnosis not present

## 2023-02-13 DIAGNOSIS — C9510 Chronic leukemia of unspecified cell type not having achieved remission: Secondary | ICD-10-CM | POA: Diagnosis not present

## 2023-02-13 DIAGNOSIS — N3281 Overactive bladder: Secondary | ICD-10-CM | POA: Diagnosis not present

## 2023-02-13 DIAGNOSIS — D72829 Elevated white blood cell count, unspecified: Secondary | ICD-10-CM | POA: Diagnosis not present

## 2023-02-14 DIAGNOSIS — R2689 Other abnormalities of gait and mobility: Secondary | ICD-10-CM | POA: Diagnosis not present

## 2023-02-14 DIAGNOSIS — L84 Corns and callosities: Secondary | ICD-10-CM | POA: Diagnosis not present

## 2023-02-14 DIAGNOSIS — B351 Tinea unguium: Secondary | ICD-10-CM | POA: Diagnosis not present

## 2023-02-14 DIAGNOSIS — M2041 Other hammer toe(s) (acquired), right foot: Secondary | ICD-10-CM | POA: Diagnosis not present

## 2023-02-14 DIAGNOSIS — M79675 Pain in left toe(s): Secondary | ICD-10-CM | POA: Diagnosis not present

## 2023-02-19 DIAGNOSIS — R2689 Other abnormalities of gait and mobility: Secondary | ICD-10-CM | POA: Diagnosis not present

## 2023-02-20 DIAGNOSIS — R5381 Other malaise: Secondary | ICD-10-CM | POA: Diagnosis not present

## 2023-02-20 DIAGNOSIS — R8290 Unspecified abnormal findings in urine: Secondary | ICD-10-CM | POA: Diagnosis not present

## 2023-02-20 DIAGNOSIS — C9510 Chronic leukemia of unspecified cell type not having achieved remission: Secondary | ICD-10-CM | POA: Diagnosis not present

## 2023-02-26 DIAGNOSIS — R2689 Other abnormalities of gait and mobility: Secondary | ICD-10-CM | POA: Diagnosis not present

## 2023-02-27 DIAGNOSIS — N3281 Overactive bladder: Secondary | ICD-10-CM | POA: Diagnosis not present

## 2023-02-27 DIAGNOSIS — J309 Allergic rhinitis, unspecified: Secondary | ICD-10-CM | POA: Diagnosis not present

## 2023-02-27 DIAGNOSIS — J45909 Unspecified asthma, uncomplicated: Secondary | ICD-10-CM | POA: Diagnosis not present

## 2023-03-01 DIAGNOSIS — F331 Major depressive disorder, recurrent, moderate: Secondary | ICD-10-CM | POA: Diagnosis not present

## 2023-03-01 DIAGNOSIS — G4700 Insomnia, unspecified: Secondary | ICD-10-CM | POA: Diagnosis not present

## 2023-03-01 DIAGNOSIS — F419 Anxiety disorder, unspecified: Secondary | ICD-10-CM | POA: Diagnosis not present

## 2023-03-05 DIAGNOSIS — R2689 Other abnormalities of gait and mobility: Secondary | ICD-10-CM | POA: Diagnosis not present

## 2023-03-06 DIAGNOSIS — R300 Dysuria: Secondary | ICD-10-CM | POA: Diagnosis not present

## 2023-03-06 DIAGNOSIS — J309 Allergic rhinitis, unspecified: Secondary | ICD-10-CM | POA: Diagnosis not present

## 2023-03-06 DIAGNOSIS — M6281 Muscle weakness (generalized): Secondary | ICD-10-CM | POA: Diagnosis not present

## 2023-03-06 DIAGNOSIS — I509 Heart failure, unspecified: Secondary | ICD-10-CM | POA: Diagnosis not present

## 2023-03-07 DIAGNOSIS — R41841 Cognitive communication deficit: Secondary | ICD-10-CM | POA: Diagnosis not present

## 2023-03-07 DIAGNOSIS — R1312 Dysphagia, oropharyngeal phase: Secondary | ICD-10-CM | POA: Diagnosis not present

## 2023-03-07 DIAGNOSIS — M6281 Muscle weakness (generalized): Secondary | ICD-10-CM | POA: Diagnosis not present

## 2023-03-08 DIAGNOSIS — R2689 Other abnormalities of gait and mobility: Secondary | ICD-10-CM | POA: Diagnosis not present

## 2023-03-13 DIAGNOSIS — R2689 Other abnormalities of gait and mobility: Secondary | ICD-10-CM | POA: Diagnosis not present

## 2023-03-14 DIAGNOSIS — M6281 Muscle weakness (generalized): Secondary | ICD-10-CM | POA: Diagnosis not present

## 2023-03-15 DIAGNOSIS — R2689 Other abnormalities of gait and mobility: Secondary | ICD-10-CM | POA: Diagnosis not present

## 2023-03-16 DIAGNOSIS — M6281 Muscle weakness (generalized): Secondary | ICD-10-CM | POA: Diagnosis not present

## 2023-03-19 DIAGNOSIS — R2689 Other abnormalities of gait and mobility: Secondary | ICD-10-CM | POA: Diagnosis not present

## 2023-03-20 DIAGNOSIS — R1312 Dysphagia, oropharyngeal phase: Secondary | ICD-10-CM | POA: Diagnosis not present

## 2023-03-20 DIAGNOSIS — M6281 Muscle weakness (generalized): Secondary | ICD-10-CM | POA: Diagnosis not present

## 2023-03-20 DIAGNOSIS — R0989 Other specified symptoms and signs involving the circulatory and respiratory systems: Secondary | ICD-10-CM | POA: Diagnosis not present

## 2023-03-20 DIAGNOSIS — R41841 Cognitive communication deficit: Secondary | ICD-10-CM | POA: Diagnosis not present

## 2023-03-20 DIAGNOSIS — I509 Heart failure, unspecified: Secondary | ICD-10-CM | POA: Diagnosis not present

## 2023-03-20 DIAGNOSIS — R062 Wheezing: Secondary | ICD-10-CM | POA: Diagnosis not present

## 2023-03-21 DIAGNOSIS — R2689 Other abnormalities of gait and mobility: Secondary | ICD-10-CM | POA: Diagnosis not present

## 2023-03-21 DIAGNOSIS — R0989 Other specified symptoms and signs involving the circulatory and respiratory systems: Secondary | ICD-10-CM | POA: Diagnosis not present

## 2023-03-26 DIAGNOSIS — R2689 Other abnormalities of gait and mobility: Secondary | ICD-10-CM | POA: Diagnosis not present

## 2023-03-27 DIAGNOSIS — J45909 Unspecified asthma, uncomplicated: Secondary | ICD-10-CM | POA: Diagnosis not present

## 2023-03-27 DIAGNOSIS — I509 Heart failure, unspecified: Secondary | ICD-10-CM | POA: Diagnosis not present

## 2023-03-27 DIAGNOSIS — R1310 Dysphagia, unspecified: Secondary | ICD-10-CM | POA: Diagnosis not present

## 2023-03-28 DIAGNOSIS — R41841 Cognitive communication deficit: Secondary | ICD-10-CM | POA: Diagnosis not present

## 2023-03-28 DIAGNOSIS — R1312 Dysphagia, oropharyngeal phase: Secondary | ICD-10-CM | POA: Diagnosis not present

## 2023-03-28 DIAGNOSIS — M6281 Muscle weakness (generalized): Secondary | ICD-10-CM | POA: Diagnosis not present

## 2023-03-29 DIAGNOSIS — R2689 Other abnormalities of gait and mobility: Secondary | ICD-10-CM | POA: Diagnosis not present

## 2023-03-29 DIAGNOSIS — G4700 Insomnia, unspecified: Secondary | ICD-10-CM | POA: Diagnosis not present

## 2023-03-29 DIAGNOSIS — F419 Anxiety disorder, unspecified: Secondary | ICD-10-CM | POA: Diagnosis not present

## 2023-03-29 DIAGNOSIS — F331 Major depressive disorder, recurrent, moderate: Secondary | ICD-10-CM | POA: Diagnosis not present

## 2023-04-02 DIAGNOSIS — R2689 Other abnormalities of gait and mobility: Secondary | ICD-10-CM | POA: Diagnosis not present

## 2023-04-03 DIAGNOSIS — R1312 Dysphagia, oropharyngeal phase: Secondary | ICD-10-CM | POA: Diagnosis not present

## 2023-04-03 DIAGNOSIS — R41841 Cognitive communication deficit: Secondary | ICD-10-CM | POA: Diagnosis not present

## 2023-04-03 DIAGNOSIS — M6281 Muscle weakness (generalized): Secondary | ICD-10-CM | POA: Diagnosis not present

## 2023-04-04 DIAGNOSIS — M6281 Muscle weakness (generalized): Secondary | ICD-10-CM | POA: Diagnosis not present

## 2023-04-05 DIAGNOSIS — R2689 Other abnormalities of gait and mobility: Secondary | ICD-10-CM | POA: Diagnosis not present

## 2023-04-09 DIAGNOSIS — M6281 Muscle weakness (generalized): Secondary | ICD-10-CM | POA: Diagnosis not present

## 2023-04-10 DIAGNOSIS — R2689 Other abnormalities of gait and mobility: Secondary | ICD-10-CM | POA: Diagnosis not present

## 2023-04-12 DIAGNOSIS — R2689 Other abnormalities of gait and mobility: Secondary | ICD-10-CM | POA: Diagnosis not present

## 2023-04-17 DIAGNOSIS — R41841 Cognitive communication deficit: Secondary | ICD-10-CM | POA: Diagnosis not present

## 2023-04-17 DIAGNOSIS — R2689 Other abnormalities of gait and mobility: Secondary | ICD-10-CM | POA: Diagnosis not present

## 2023-04-17 DIAGNOSIS — R1312 Dysphagia, oropharyngeal phase: Secondary | ICD-10-CM | POA: Diagnosis not present

## 2023-04-18 DIAGNOSIS — M6281 Muscle weakness (generalized): Secondary | ICD-10-CM | POA: Diagnosis not present

## 2023-04-19 DIAGNOSIS — R2689 Other abnormalities of gait and mobility: Secondary | ICD-10-CM | POA: Diagnosis not present

## 2023-04-23 DIAGNOSIS — R2689 Other abnormalities of gait and mobility: Secondary | ICD-10-CM | POA: Diagnosis not present

## 2023-04-24 DIAGNOSIS — M6281 Muscle weakness (generalized): Secondary | ICD-10-CM | POA: Diagnosis not present

## 2023-04-24 DIAGNOSIS — C9510 Chronic leukemia of unspecified cell type not having achieved remission: Secondary | ICD-10-CM | POA: Diagnosis not present

## 2023-04-24 DIAGNOSIS — I130 Hypertensive heart and chronic kidney disease with heart failure and stage 1 through stage 4 chronic kidney disease, or unspecified chronic kidney disease: Secondary | ICD-10-CM | POA: Diagnosis not present

## 2023-04-24 DIAGNOSIS — R1312 Dysphagia, oropharyngeal phase: Secondary | ICD-10-CM | POA: Diagnosis not present

## 2023-04-24 DIAGNOSIS — I509 Heart failure, unspecified: Secondary | ICD-10-CM | POA: Diagnosis not present

## 2023-04-24 DIAGNOSIS — R41841 Cognitive communication deficit: Secondary | ICD-10-CM | POA: Diagnosis not present

## 2023-04-24 DIAGNOSIS — N182 Chronic kidney disease, stage 2 (mild): Secondary | ICD-10-CM | POA: Diagnosis not present

## 2023-04-25 DIAGNOSIS — R2689 Other abnormalities of gait and mobility: Secondary | ICD-10-CM | POA: Diagnosis not present

## 2023-04-26 DIAGNOSIS — M6281 Muscle weakness (generalized): Secondary | ICD-10-CM | POA: Diagnosis not present

## 2023-04-26 DIAGNOSIS — F331 Major depressive disorder, recurrent, moderate: Secondary | ICD-10-CM | POA: Diagnosis not present

## 2023-04-26 DIAGNOSIS — Z1331 Encounter for screening for depression: Secondary | ICD-10-CM | POA: Diagnosis not present

## 2023-04-26 DIAGNOSIS — G4700 Insomnia, unspecified: Secondary | ICD-10-CM | POA: Diagnosis not present

## 2023-04-26 DIAGNOSIS — F419 Anxiety disorder, unspecified: Secondary | ICD-10-CM | POA: Diagnosis not present

## 2023-04-30 DIAGNOSIS — R2689 Other abnormalities of gait and mobility: Secondary | ICD-10-CM | POA: Diagnosis not present

## 2023-05-01 DIAGNOSIS — E119 Type 2 diabetes mellitus without complications: Secondary | ICD-10-CM | POA: Diagnosis not present

## 2023-05-01 DIAGNOSIS — R41841 Cognitive communication deficit: Secondary | ICD-10-CM | POA: Diagnosis not present

## 2023-05-01 DIAGNOSIS — M6281 Muscle weakness (generalized): Secondary | ICD-10-CM | POA: Diagnosis not present

## 2023-05-01 DIAGNOSIS — R1312 Dysphagia, oropharyngeal phase: Secondary | ICD-10-CM | POA: Diagnosis not present

## 2023-05-02 DIAGNOSIS — R2689 Other abnormalities of gait and mobility: Secondary | ICD-10-CM | POA: Diagnosis not present

## 2023-05-03 DIAGNOSIS — M6281 Muscle weakness (generalized): Secondary | ICD-10-CM | POA: Diagnosis not present

## 2023-05-07 DIAGNOSIS — R41841 Cognitive communication deficit: Secondary | ICD-10-CM | POA: Diagnosis not present

## 2023-05-07 DIAGNOSIS — M6281 Muscle weakness (generalized): Secondary | ICD-10-CM | POA: Diagnosis not present

## 2023-05-07 DIAGNOSIS — R1312 Dysphagia, oropharyngeal phase: Secondary | ICD-10-CM | POA: Diagnosis not present

## 2023-05-08 DIAGNOSIS — R2689 Other abnormalities of gait and mobility: Secondary | ICD-10-CM | POA: Diagnosis not present

## 2023-05-09 DIAGNOSIS — M6281 Muscle weakness (generalized): Secondary | ICD-10-CM | POA: Diagnosis not present

## 2023-05-10 DIAGNOSIS — R2689 Other abnormalities of gait and mobility: Secondary | ICD-10-CM | POA: Diagnosis not present

## 2023-05-14 DIAGNOSIS — R2689 Other abnormalities of gait and mobility: Secondary | ICD-10-CM | POA: Diagnosis not present

## 2023-05-15 DIAGNOSIS — G4700 Insomnia, unspecified: Secondary | ICD-10-CM | POA: Diagnosis not present

## 2023-05-15 DIAGNOSIS — R1312 Dysphagia, oropharyngeal phase: Secondary | ICD-10-CM | POA: Diagnosis not present

## 2023-05-15 DIAGNOSIS — M6281 Muscle weakness (generalized): Secondary | ICD-10-CM | POA: Diagnosis not present

## 2023-05-15 DIAGNOSIS — R41841 Cognitive communication deficit: Secondary | ICD-10-CM | POA: Diagnosis not present

## 2023-05-16 DIAGNOSIS — S90121A Contusion of right lesser toe(s) without damage to nail, initial encounter: Secondary | ICD-10-CM | POA: Diagnosis not present

## 2023-05-16 DIAGNOSIS — B351 Tinea unguium: Secondary | ICD-10-CM | POA: Diagnosis not present

## 2023-05-16 DIAGNOSIS — L84 Corns and callosities: Secondary | ICD-10-CM | POA: Diagnosis not present

## 2023-05-16 DIAGNOSIS — M2041 Other hammer toe(s) (acquired), right foot: Secondary | ICD-10-CM | POA: Diagnosis not present

## 2023-05-16 DIAGNOSIS — M79675 Pain in left toe(s): Secondary | ICD-10-CM | POA: Diagnosis not present

## 2023-05-16 DIAGNOSIS — R2689 Other abnormalities of gait and mobility: Secondary | ICD-10-CM | POA: Diagnosis not present

## 2023-05-17 DIAGNOSIS — M6281 Muscle weakness (generalized): Secondary | ICD-10-CM | POA: Diagnosis not present

## 2023-05-22 DIAGNOSIS — M6281 Muscle weakness (generalized): Secondary | ICD-10-CM | POA: Diagnosis not present

## 2023-05-22 DIAGNOSIS — R1312 Dysphagia, oropharyngeal phase: Secondary | ICD-10-CM | POA: Diagnosis not present

## 2023-05-22 DIAGNOSIS — R41841 Cognitive communication deficit: Secondary | ICD-10-CM | POA: Diagnosis not present

## 2023-05-23 DIAGNOSIS — R2689 Other abnormalities of gait and mobility: Secondary | ICD-10-CM | POA: Diagnosis not present

## 2023-05-24 DIAGNOSIS — M6281 Muscle weakness (generalized): Secondary | ICD-10-CM | POA: Diagnosis not present

## 2023-05-24 DIAGNOSIS — F419 Anxiety disorder, unspecified: Secondary | ICD-10-CM | POA: Diagnosis not present

## 2023-05-24 DIAGNOSIS — G4700 Insomnia, unspecified: Secondary | ICD-10-CM | POA: Diagnosis not present

## 2023-05-24 DIAGNOSIS — F331 Major depressive disorder, recurrent, moderate: Secondary | ICD-10-CM | POA: Diagnosis not present

## 2023-05-25 DIAGNOSIS — R2689 Other abnormalities of gait and mobility: Secondary | ICD-10-CM | POA: Diagnosis not present

## 2023-05-28 DIAGNOSIS — R2689 Other abnormalities of gait and mobility: Secondary | ICD-10-CM | POA: Diagnosis not present

## 2023-05-29 DIAGNOSIS — M6281 Muscle weakness (generalized): Secondary | ICD-10-CM | POA: Diagnosis not present

## 2023-05-29 DIAGNOSIS — R1312 Dysphagia, oropharyngeal phase: Secondary | ICD-10-CM | POA: Diagnosis not present

## 2023-05-29 DIAGNOSIS — R41841 Cognitive communication deficit: Secondary | ICD-10-CM | POA: Diagnosis not present

## 2023-05-30 DIAGNOSIS — R2689 Other abnormalities of gait and mobility: Secondary | ICD-10-CM | POA: Diagnosis not present

## 2023-06-01 DIAGNOSIS — M6281 Muscle weakness (generalized): Secondary | ICD-10-CM | POA: Diagnosis not present

## 2023-06-04 DIAGNOSIS — R2689 Other abnormalities of gait and mobility: Secondary | ICD-10-CM | POA: Diagnosis not present

## 2023-06-05 DIAGNOSIS — M6281 Muscle weakness (generalized): Secondary | ICD-10-CM | POA: Diagnosis not present

## 2023-06-06 DIAGNOSIS — R2689 Other abnormalities of gait and mobility: Secondary | ICD-10-CM | POA: Diagnosis not present

## 2023-06-06 DIAGNOSIS — R1312 Dysphagia, oropharyngeal phase: Secondary | ICD-10-CM | POA: Diagnosis not present

## 2023-06-06 DIAGNOSIS — R41841 Cognitive communication deficit: Secondary | ICD-10-CM | POA: Diagnosis not present

## 2023-06-07 DIAGNOSIS — M6281 Muscle weakness (generalized): Secondary | ICD-10-CM | POA: Diagnosis not present

## 2023-06-12 DIAGNOSIS — J189 Pneumonia, unspecified organism: Secondary | ICD-10-CM | POA: Diagnosis not present

## 2023-06-12 DIAGNOSIS — C9110 Chronic lymphocytic leukemia of B-cell type not having achieved remission: Secondary | ICD-10-CM | POA: Diagnosis not present

## 2023-06-12 DIAGNOSIS — R001 Bradycardia, unspecified: Secondary | ICD-10-CM | POA: Diagnosis not present

## 2023-06-12 DIAGNOSIS — R0689 Other abnormalities of breathing: Secondary | ICD-10-CM | POA: Diagnosis not present

## 2023-06-12 DIAGNOSIS — M6281 Muscle weakness (generalized): Secondary | ICD-10-CM | POA: Diagnosis not present

## 2023-06-12 DIAGNOSIS — E785 Hyperlipidemia, unspecified: Secondary | ICD-10-CM | POA: Diagnosis not present

## 2023-06-12 DIAGNOSIS — N390 Urinary tract infection, site not specified: Secondary | ICD-10-CM | POA: Diagnosis not present

## 2023-06-12 DIAGNOSIS — Z23 Encounter for immunization: Secondary | ICD-10-CM | POA: Diagnosis not present

## 2023-06-12 DIAGNOSIS — R918 Other nonspecific abnormal finding of lung field: Secondary | ICD-10-CM | POA: Diagnosis not present

## 2023-06-12 DIAGNOSIS — J479 Bronchiectasis, uncomplicated: Secondary | ICD-10-CM | POA: Diagnosis not present

## 2023-06-12 DIAGNOSIS — F411 Generalized anxiety disorder: Secondary | ICD-10-CM | POA: Diagnosis not present

## 2023-06-12 DIAGNOSIS — S0990XA Unspecified injury of head, initial encounter: Secondary | ICD-10-CM | POA: Diagnosis not present

## 2023-06-12 DIAGNOSIS — F1320 Sedative, hypnotic or anxiolytic dependence, uncomplicated: Secondary | ICD-10-CM | POA: Diagnosis not present

## 2023-06-12 DIAGNOSIS — I700 Atherosclerosis of aorta: Secondary | ICD-10-CM | POA: Diagnosis not present

## 2023-06-12 DIAGNOSIS — K219 Gastro-esophageal reflux disease without esophagitis: Secondary | ICD-10-CM | POA: Diagnosis not present

## 2023-06-12 DIAGNOSIS — I69359 Hemiplegia and hemiparesis following cerebral infarction affecting unspecified side: Secondary | ICD-10-CM | POA: Diagnosis not present

## 2023-06-12 DIAGNOSIS — J9601 Acute respiratory failure with hypoxia: Secondary | ICD-10-CM | POA: Diagnosis not present

## 2023-06-12 DIAGNOSIS — R400 Somnolence: Secondary | ICD-10-CM | POA: Diagnosis not present

## 2023-06-12 DIAGNOSIS — E039 Hypothyroidism, unspecified: Secondary | ICD-10-CM | POA: Diagnosis not present

## 2023-06-12 DIAGNOSIS — R55 Syncope and collapse: Secondary | ICD-10-CM | POA: Diagnosis not present

## 2023-06-12 DIAGNOSIS — J9811 Atelectasis: Secondary | ICD-10-CM | POA: Diagnosis not present

## 2023-06-12 DIAGNOSIS — I10 Essential (primary) hypertension: Secondary | ICD-10-CM | POA: Diagnosis not present

## 2023-06-12 DIAGNOSIS — N3001 Acute cystitis with hematuria: Secondary | ICD-10-CM | POA: Diagnosis not present

## 2023-06-12 DIAGNOSIS — E860 Dehydration: Secondary | ICD-10-CM | POA: Diagnosis not present

## 2023-06-12 DIAGNOSIS — C9510 Chronic leukemia of unspecified cell type not having achieved remission: Secondary | ICD-10-CM | POA: Diagnosis not present

## 2023-06-12 DIAGNOSIS — R0902 Hypoxemia: Secondary | ICD-10-CM | POA: Diagnosis not present

## 2023-06-13 DIAGNOSIS — R55 Syncope and collapse: Secondary | ICD-10-CM | POA: Diagnosis not present

## 2023-06-13 DIAGNOSIS — D696 Thrombocytopenia, unspecified: Secondary | ICD-10-CM | POA: Diagnosis not present

## 2023-06-13 DIAGNOSIS — Z23 Encounter for immunization: Secondary | ICD-10-CM | POA: Diagnosis not present

## 2023-06-13 DIAGNOSIS — R918 Other nonspecific abnormal finding of lung field: Secondary | ICD-10-CM | POA: Diagnosis not present

## 2023-06-13 DIAGNOSIS — J9601 Acute respiratory failure with hypoxia: Secondary | ICD-10-CM | POA: Diagnosis not present

## 2023-06-13 DIAGNOSIS — I4510 Unspecified right bundle-branch block: Secondary | ICD-10-CM | POA: Diagnosis not present

## 2023-06-13 DIAGNOSIS — N390 Urinary tract infection, site not specified: Secondary | ICD-10-CM | POA: Diagnosis not present

## 2023-06-13 DIAGNOSIS — J159 Unspecified bacterial pneumonia: Secondary | ICD-10-CM | POA: Diagnosis not present

## 2023-06-13 DIAGNOSIS — R0902 Hypoxemia: Secondary | ICD-10-CM | POA: Diagnosis not present

## 2023-06-13 DIAGNOSIS — Z853 Personal history of malignant neoplasm of breast: Secondary | ICD-10-CM | POA: Diagnosis not present

## 2023-06-13 DIAGNOSIS — G459 Transient cerebral ischemic attack, unspecified: Secondary | ICD-10-CM | POA: Diagnosis not present

## 2023-06-13 DIAGNOSIS — T17320A Food in larynx causing asphyxiation, initial encounter: Secondary | ICD-10-CM | POA: Diagnosis not present

## 2023-06-13 DIAGNOSIS — M1990 Unspecified osteoarthritis, unspecified site: Secondary | ICD-10-CM | POA: Diagnosis not present

## 2023-06-13 DIAGNOSIS — I700 Atherosclerosis of aorta: Secondary | ICD-10-CM | POA: Diagnosis not present

## 2023-06-13 DIAGNOSIS — Z7409 Other reduced mobility: Secondary | ICD-10-CM | POA: Diagnosis not present

## 2023-06-13 DIAGNOSIS — E039 Hypothyroidism, unspecified: Secondary | ICD-10-CM | POA: Diagnosis not present

## 2023-06-13 DIAGNOSIS — J479 Bronchiectasis, uncomplicated: Secondary | ICD-10-CM | POA: Diagnosis not present

## 2023-06-13 DIAGNOSIS — S0990XA Unspecified injury of head, initial encounter: Secondary | ICD-10-CM | POA: Diagnosis not present

## 2023-06-13 DIAGNOSIS — Z66 Do not resuscitate: Secondary | ICD-10-CM | POA: Diagnosis not present

## 2023-06-13 DIAGNOSIS — N3001 Acute cystitis with hematuria: Secondary | ICD-10-CM | POA: Diagnosis not present

## 2023-06-13 DIAGNOSIS — J40 Bronchitis, not specified as acute or chronic: Secondary | ICD-10-CM | POA: Diagnosis not present

## 2023-06-13 DIAGNOSIS — D72820 Lymphocytosis (symptomatic): Secondary | ICD-10-CM | POA: Diagnosis not present

## 2023-06-13 DIAGNOSIS — H3530 Unspecified macular degeneration: Secondary | ICD-10-CM | POA: Diagnosis not present

## 2023-06-13 DIAGNOSIS — I69354 Hemiplegia and hemiparesis following cerebral infarction affecting left non-dominant side: Secondary | ICD-10-CM | POA: Diagnosis not present

## 2023-06-13 DIAGNOSIS — C9110 Chronic lymphocytic leukemia of B-cell type not having achieved remission: Secondary | ICD-10-CM | POA: Diagnosis not present

## 2023-06-13 DIAGNOSIS — R059 Cough, unspecified: Secondary | ICD-10-CM | POA: Diagnosis not present

## 2023-06-13 DIAGNOSIS — J90 Pleural effusion, not elsewhere classified: Secondary | ICD-10-CM | POA: Diagnosis not present

## 2023-06-13 DIAGNOSIS — G8190 Hemiplegia, unspecified affecting unspecified side: Secondary | ICD-10-CM | POA: Diagnosis not present

## 2023-06-13 DIAGNOSIS — B952 Enterococcus as the cause of diseases classified elsewhere: Secondary | ICD-10-CM | POA: Diagnosis not present

## 2023-06-13 DIAGNOSIS — I10 Essential (primary) hypertension: Secondary | ICD-10-CM | POA: Diagnosis not present

## 2023-06-13 DIAGNOSIS — Z8673 Personal history of transient ischemic attack (TIA), and cerebral infarction without residual deficits: Secondary | ICD-10-CM | POA: Diagnosis not present

## 2023-06-13 DIAGNOSIS — N3000 Acute cystitis without hematuria: Secondary | ICD-10-CM | POA: Diagnosis not present

## 2023-06-13 DIAGNOSIS — J189 Pneumonia, unspecified organism: Secondary | ICD-10-CM | POA: Diagnosis not present

## 2023-06-13 DIAGNOSIS — J9811 Atelectasis: Secondary | ICD-10-CM | POA: Diagnosis not present

## 2023-06-13 DIAGNOSIS — K219 Gastro-esophageal reflux disease without esophagitis: Secondary | ICD-10-CM | POA: Diagnosis not present

## 2023-06-13 DIAGNOSIS — Z7401 Bed confinement status: Secondary | ICD-10-CM | POA: Diagnosis not present

## 2023-06-13 DIAGNOSIS — F411 Generalized anxiety disorder: Secondary | ICD-10-CM | POA: Diagnosis not present

## 2023-06-13 DIAGNOSIS — R1310 Dysphagia, unspecified: Secondary | ICD-10-CM | POA: Diagnosis not present

## 2023-06-13 DIAGNOSIS — E785 Hyperlipidemia, unspecified: Secondary | ICD-10-CM | POA: Diagnosis not present

## 2023-06-14 DIAGNOSIS — N3000 Acute cystitis without hematuria: Secondary | ICD-10-CM | POA: Diagnosis not present

## 2023-06-14 DIAGNOSIS — C9110 Chronic lymphocytic leukemia of B-cell type not having achieved remission: Secondary | ICD-10-CM | POA: Diagnosis not present

## 2023-06-14 DIAGNOSIS — Z8673 Personal history of transient ischemic attack (TIA), and cerebral infarction without residual deficits: Secondary | ICD-10-CM | POA: Diagnosis not present

## 2023-06-14 DIAGNOSIS — J9601 Acute respiratory failure with hypoxia: Secondary | ICD-10-CM | POA: Diagnosis not present

## 2023-06-14 DIAGNOSIS — J189 Pneumonia, unspecified organism: Secondary | ICD-10-CM | POA: Diagnosis not present

## 2023-06-14 DIAGNOSIS — D72820 Lymphocytosis (symptomatic): Secondary | ICD-10-CM | POA: Diagnosis not present

## 2023-06-14 DIAGNOSIS — Z853 Personal history of malignant neoplasm of breast: Secondary | ICD-10-CM | POA: Diagnosis not present

## 2023-06-15 DIAGNOSIS — C9110 Chronic lymphocytic leukemia of B-cell type not having achieved remission: Secondary | ICD-10-CM | POA: Diagnosis not present

## 2023-06-15 DIAGNOSIS — Z8673 Personal history of transient ischemic attack (TIA), and cerebral infarction without residual deficits: Secondary | ICD-10-CM | POA: Diagnosis not present

## 2023-06-15 DIAGNOSIS — N3000 Acute cystitis without hematuria: Secondary | ICD-10-CM | POA: Diagnosis not present

## 2023-06-15 DIAGNOSIS — J189 Pneumonia, unspecified organism: Secondary | ICD-10-CM | POA: Diagnosis not present

## 2023-06-15 DIAGNOSIS — J9601 Acute respiratory failure with hypoxia: Secondary | ICD-10-CM | POA: Diagnosis not present

## 2023-06-15 DIAGNOSIS — D72820 Lymphocytosis (symptomatic): Secondary | ICD-10-CM | POA: Diagnosis not present

## 2023-06-15 DIAGNOSIS — Z853 Personal history of malignant neoplasm of breast: Secondary | ICD-10-CM | POA: Diagnosis not present

## 2023-06-16 DIAGNOSIS — Z853 Personal history of malignant neoplasm of breast: Secondary | ICD-10-CM | POA: Diagnosis not present

## 2023-06-16 DIAGNOSIS — C9110 Chronic lymphocytic leukemia of B-cell type not having achieved remission: Secondary | ICD-10-CM | POA: Diagnosis not present

## 2023-06-16 DIAGNOSIS — J189 Pneumonia, unspecified organism: Secondary | ICD-10-CM | POA: Diagnosis not present

## 2023-06-16 DIAGNOSIS — J9601 Acute respiratory failure with hypoxia: Secondary | ICD-10-CM | POA: Diagnosis not present

## 2023-06-16 DIAGNOSIS — N3000 Acute cystitis without hematuria: Secondary | ICD-10-CM | POA: Diagnosis not present

## 2023-06-16 DIAGNOSIS — D72820 Lymphocytosis (symptomatic): Secondary | ICD-10-CM | POA: Diagnosis not present

## 2023-06-16 DIAGNOSIS — Z8673 Personal history of transient ischemic attack (TIA), and cerebral infarction without residual deficits: Secondary | ICD-10-CM | POA: Diagnosis not present

## 2023-06-17 DIAGNOSIS — C9110 Chronic lymphocytic leukemia of B-cell type not having achieved remission: Secondary | ICD-10-CM | POA: Diagnosis not present

## 2023-06-17 DIAGNOSIS — D72820 Lymphocytosis (symptomatic): Secondary | ICD-10-CM | POA: Diagnosis not present

## 2023-06-17 DIAGNOSIS — N3000 Acute cystitis without hematuria: Secondary | ICD-10-CM | POA: Diagnosis not present

## 2023-06-17 DIAGNOSIS — J9601 Acute respiratory failure with hypoxia: Secondary | ICD-10-CM | POA: Diagnosis not present

## 2023-06-17 DIAGNOSIS — Z8673 Personal history of transient ischemic attack (TIA), and cerebral infarction without residual deficits: Secondary | ICD-10-CM | POA: Diagnosis not present

## 2023-06-17 DIAGNOSIS — J189 Pneumonia, unspecified organism: Secondary | ICD-10-CM | POA: Diagnosis not present

## 2023-06-17 DIAGNOSIS — Z853 Personal history of malignant neoplasm of breast: Secondary | ICD-10-CM | POA: Diagnosis not present

## 2023-06-18 DIAGNOSIS — N3000 Acute cystitis without hematuria: Secondary | ICD-10-CM | POA: Diagnosis not present

## 2023-06-18 DIAGNOSIS — J9601 Acute respiratory failure with hypoxia: Secondary | ICD-10-CM | POA: Diagnosis not present

## 2023-06-18 DIAGNOSIS — J189 Pneumonia, unspecified organism: Secondary | ICD-10-CM | POA: Diagnosis not present

## 2023-06-18 DIAGNOSIS — Z8673 Personal history of transient ischemic attack (TIA), and cerebral infarction without residual deficits: Secondary | ICD-10-CM | POA: Diagnosis not present

## 2023-06-18 DIAGNOSIS — D72820 Lymphocytosis (symptomatic): Secondary | ICD-10-CM | POA: Diagnosis not present

## 2023-06-18 DIAGNOSIS — C9110 Chronic lymphocytic leukemia of B-cell type not having achieved remission: Secondary | ICD-10-CM | POA: Diagnosis not present

## 2023-06-18 DIAGNOSIS — Z853 Personal history of malignant neoplasm of breast: Secondary | ICD-10-CM | POA: Diagnosis not present

## 2023-06-19 DIAGNOSIS — D72820 Lymphocytosis (symptomatic): Secondary | ICD-10-CM | POA: Diagnosis not present

## 2023-06-19 DIAGNOSIS — N3000 Acute cystitis without hematuria: Secondary | ICD-10-CM | POA: Diagnosis not present

## 2023-06-19 DIAGNOSIS — C9110 Chronic lymphocytic leukemia of B-cell type not having achieved remission: Secondary | ICD-10-CM | POA: Diagnosis not present

## 2023-06-19 DIAGNOSIS — J9601 Acute respiratory failure with hypoxia: Secondary | ICD-10-CM | POA: Diagnosis not present

## 2023-06-19 DIAGNOSIS — Z8673 Personal history of transient ischemic attack (TIA), and cerebral infarction without residual deficits: Secondary | ICD-10-CM | POA: Diagnosis not present

## 2023-06-19 DIAGNOSIS — I10 Essential (primary) hypertension: Secondary | ICD-10-CM | POA: Diagnosis not present

## 2023-06-19 DIAGNOSIS — Z853 Personal history of malignant neoplasm of breast: Secondary | ICD-10-CM | POA: Diagnosis not present

## 2023-06-19 DIAGNOSIS — J189 Pneumonia, unspecified organism: Secondary | ICD-10-CM | POA: Diagnosis not present

## 2023-06-20 DIAGNOSIS — Z853 Personal history of malignant neoplasm of breast: Secondary | ICD-10-CM | POA: Diagnosis not present

## 2023-06-20 DIAGNOSIS — I10 Essential (primary) hypertension: Secondary | ICD-10-CM | POA: Diagnosis not present

## 2023-06-20 DIAGNOSIS — J9601 Acute respiratory failure with hypoxia: Secondary | ICD-10-CM | POA: Diagnosis not present

## 2023-06-20 DIAGNOSIS — J189 Pneumonia, unspecified organism: Secondary | ICD-10-CM | POA: Diagnosis not present

## 2023-06-20 DIAGNOSIS — D72820 Lymphocytosis (symptomatic): Secondary | ICD-10-CM | POA: Diagnosis not present

## 2023-06-20 DIAGNOSIS — C9110 Chronic lymphocytic leukemia of B-cell type not having achieved remission: Secondary | ICD-10-CM | POA: Diagnosis not present

## 2023-06-20 DIAGNOSIS — Z8673 Personal history of transient ischemic attack (TIA), and cerebral infarction without residual deficits: Secondary | ICD-10-CM | POA: Diagnosis not present

## 2023-06-20 DIAGNOSIS — N3000 Acute cystitis without hematuria: Secondary | ICD-10-CM | POA: Diagnosis not present

## 2023-06-21 DIAGNOSIS — N3000 Acute cystitis without hematuria: Secondary | ICD-10-CM | POA: Diagnosis not present

## 2023-06-21 DIAGNOSIS — J9601 Acute respiratory failure with hypoxia: Secondary | ICD-10-CM | POA: Diagnosis not present

## 2023-06-21 DIAGNOSIS — Z8673 Personal history of transient ischemic attack (TIA), and cerebral infarction without residual deficits: Secondary | ICD-10-CM | POA: Diagnosis not present

## 2023-06-21 DIAGNOSIS — Z853 Personal history of malignant neoplasm of breast: Secondary | ICD-10-CM | POA: Diagnosis not present

## 2023-06-21 DIAGNOSIS — I10 Essential (primary) hypertension: Secondary | ICD-10-CM | POA: Diagnosis not present

## 2023-06-21 DIAGNOSIS — D72820 Lymphocytosis (symptomatic): Secondary | ICD-10-CM | POA: Diagnosis not present

## 2023-06-21 DIAGNOSIS — J189 Pneumonia, unspecified organism: Secondary | ICD-10-CM | POA: Diagnosis not present

## 2023-06-21 DIAGNOSIS — C9110 Chronic lymphocytic leukemia of B-cell type not having achieved remission: Secondary | ICD-10-CM | POA: Diagnosis not present

## 2023-06-22 DIAGNOSIS — Z853 Personal history of malignant neoplasm of breast: Secondary | ICD-10-CM | POA: Diagnosis not present

## 2023-06-22 DIAGNOSIS — J189 Pneumonia, unspecified organism: Secondary | ICD-10-CM | POA: Diagnosis not present

## 2023-06-22 DIAGNOSIS — J9601 Acute respiratory failure with hypoxia: Secondary | ICD-10-CM | POA: Diagnosis not present

## 2023-06-22 DIAGNOSIS — N3000 Acute cystitis without hematuria: Secondary | ICD-10-CM | POA: Diagnosis not present

## 2023-06-22 DIAGNOSIS — I10 Essential (primary) hypertension: Secondary | ICD-10-CM | POA: Diagnosis not present

## 2023-06-22 DIAGNOSIS — C9110 Chronic lymphocytic leukemia of B-cell type not having achieved remission: Secondary | ICD-10-CM | POA: Diagnosis not present

## 2023-06-22 DIAGNOSIS — D72820 Lymphocytosis (symptomatic): Secondary | ICD-10-CM | POA: Diagnosis not present

## 2023-06-22 DIAGNOSIS — Z8673 Personal history of transient ischemic attack (TIA), and cerebral infarction without residual deficits: Secondary | ICD-10-CM | POA: Diagnosis not present

## 2023-06-23 DIAGNOSIS — C9110 Chronic lymphocytic leukemia of B-cell type not having achieved remission: Secondary | ICD-10-CM | POA: Diagnosis not present

## 2023-06-23 DIAGNOSIS — D72820 Lymphocytosis (symptomatic): Secondary | ICD-10-CM | POA: Diagnosis not present

## 2023-06-23 DIAGNOSIS — Z8673 Personal history of transient ischemic attack (TIA), and cerebral infarction without residual deficits: Secondary | ICD-10-CM | POA: Diagnosis not present

## 2023-06-23 DIAGNOSIS — N3000 Acute cystitis without hematuria: Secondary | ICD-10-CM | POA: Diagnosis not present

## 2023-06-23 DIAGNOSIS — J9601 Acute respiratory failure with hypoxia: Secondary | ICD-10-CM | POA: Diagnosis not present

## 2023-06-23 DIAGNOSIS — I10 Essential (primary) hypertension: Secondary | ICD-10-CM | POA: Diagnosis not present

## 2023-06-23 DIAGNOSIS — Z853 Personal history of malignant neoplasm of breast: Secondary | ICD-10-CM | POA: Diagnosis not present

## 2023-06-23 DIAGNOSIS — J189 Pneumonia, unspecified organism: Secondary | ICD-10-CM | POA: Diagnosis not present

## 2023-06-24 DIAGNOSIS — I10 Essential (primary) hypertension: Secondary | ICD-10-CM | POA: Diagnosis not present

## 2023-06-24 DIAGNOSIS — J9601 Acute respiratory failure with hypoxia: Secondary | ICD-10-CM | POA: Diagnosis not present

## 2023-06-24 DIAGNOSIS — J189 Pneumonia, unspecified organism: Secondary | ICD-10-CM | POA: Diagnosis not present

## 2023-06-24 DIAGNOSIS — C9110 Chronic lymphocytic leukemia of B-cell type not having achieved remission: Secondary | ICD-10-CM | POA: Diagnosis not present

## 2023-06-24 DIAGNOSIS — Z853 Personal history of malignant neoplasm of breast: Secondary | ICD-10-CM | POA: Diagnosis not present

## 2023-06-24 DIAGNOSIS — N3000 Acute cystitis without hematuria: Secondary | ICD-10-CM | POA: Diagnosis not present

## 2023-06-24 DIAGNOSIS — Z8673 Personal history of transient ischemic attack (TIA), and cerebral infarction without residual deficits: Secondary | ICD-10-CM | POA: Diagnosis not present

## 2023-06-24 DIAGNOSIS — D72820 Lymphocytosis (symptomatic): Secondary | ICD-10-CM | POA: Diagnosis not present

## 2023-06-25 DIAGNOSIS — D72820 Lymphocytosis (symptomatic): Secondary | ICD-10-CM | POA: Diagnosis not present

## 2023-06-25 DIAGNOSIS — I10 Essential (primary) hypertension: Secondary | ICD-10-CM | POA: Diagnosis not present

## 2023-06-25 DIAGNOSIS — Z8673 Personal history of transient ischemic attack (TIA), and cerebral infarction without residual deficits: Secondary | ICD-10-CM | POA: Diagnosis not present

## 2023-06-25 DIAGNOSIS — N3000 Acute cystitis without hematuria: Secondary | ICD-10-CM | POA: Diagnosis not present

## 2023-06-25 DIAGNOSIS — Z853 Personal history of malignant neoplasm of breast: Secondary | ICD-10-CM | POA: Diagnosis not present

## 2023-06-25 DIAGNOSIS — C9110 Chronic lymphocytic leukemia of B-cell type not having achieved remission: Secondary | ICD-10-CM | POA: Diagnosis not present

## 2023-06-25 DIAGNOSIS — J9601 Acute respiratory failure with hypoxia: Secondary | ICD-10-CM | POA: Diagnosis not present

## 2023-06-25 DIAGNOSIS — J189 Pneumonia, unspecified organism: Secondary | ICD-10-CM | POA: Diagnosis not present

## 2023-06-26 DIAGNOSIS — C9110 Chronic lymphocytic leukemia of B-cell type not having achieved remission: Secondary | ICD-10-CM | POA: Diagnosis not present

## 2023-06-26 DIAGNOSIS — I10 Essential (primary) hypertension: Secondary | ICD-10-CM | POA: Diagnosis not present

## 2023-06-26 DIAGNOSIS — J189 Pneumonia, unspecified organism: Secondary | ICD-10-CM | POA: Diagnosis not present

## 2023-06-26 DIAGNOSIS — D72820 Lymphocytosis (symptomatic): Secondary | ICD-10-CM | POA: Diagnosis not present

## 2023-06-26 DIAGNOSIS — N3000 Acute cystitis without hematuria: Secondary | ICD-10-CM | POA: Diagnosis not present

## 2023-06-26 DIAGNOSIS — Z8673 Personal history of transient ischemic attack (TIA), and cerebral infarction without residual deficits: Secondary | ICD-10-CM | POA: Diagnosis not present

## 2023-06-26 DIAGNOSIS — J9601 Acute respiratory failure with hypoxia: Secondary | ICD-10-CM | POA: Diagnosis not present

## 2023-06-26 DIAGNOSIS — Z853 Personal history of malignant neoplasm of breast: Secondary | ICD-10-CM | POA: Diagnosis not present

## 2023-06-27 DIAGNOSIS — J189 Pneumonia, unspecified organism: Secondary | ICD-10-CM | POA: Diagnosis not present

## 2023-06-27 DIAGNOSIS — Z8673 Personal history of transient ischemic attack (TIA), and cerebral infarction without residual deficits: Secondary | ICD-10-CM | POA: Diagnosis not present

## 2023-06-27 DIAGNOSIS — C9110 Chronic lymphocytic leukemia of B-cell type not having achieved remission: Secondary | ICD-10-CM | POA: Diagnosis not present

## 2023-06-27 DIAGNOSIS — J9601 Acute respiratory failure with hypoxia: Secondary | ICD-10-CM | POA: Diagnosis not present

## 2023-06-27 DIAGNOSIS — N3000 Acute cystitis without hematuria: Secondary | ICD-10-CM | POA: Diagnosis not present

## 2023-06-27 DIAGNOSIS — D72820 Lymphocytosis (symptomatic): Secondary | ICD-10-CM | POA: Diagnosis not present

## 2023-06-27 DIAGNOSIS — Z853 Personal history of malignant neoplasm of breast: Secondary | ICD-10-CM | POA: Diagnosis not present

## 2023-06-27 DIAGNOSIS — I10 Essential (primary) hypertension: Secondary | ICD-10-CM | POA: Diagnosis not present

## 2023-06-28 DIAGNOSIS — I10 Essential (primary) hypertension: Secondary | ICD-10-CM | POA: Diagnosis not present

## 2023-06-28 DIAGNOSIS — D72820 Lymphocytosis (symptomatic): Secondary | ICD-10-CM | POA: Diagnosis not present

## 2023-06-28 DIAGNOSIS — Z8673 Personal history of transient ischemic attack (TIA), and cerebral infarction without residual deficits: Secondary | ICD-10-CM | POA: Diagnosis not present

## 2023-06-28 DIAGNOSIS — N3000 Acute cystitis without hematuria: Secondary | ICD-10-CM | POA: Diagnosis not present

## 2023-06-28 DIAGNOSIS — C9110 Chronic lymphocytic leukemia of B-cell type not having achieved remission: Secondary | ICD-10-CM | POA: Diagnosis not present

## 2023-06-28 DIAGNOSIS — Z853 Personal history of malignant neoplasm of breast: Secondary | ICD-10-CM | POA: Diagnosis not present

## 2023-06-28 DIAGNOSIS — J189 Pneumonia, unspecified organism: Secondary | ICD-10-CM | POA: Diagnosis not present

## 2023-06-28 DIAGNOSIS — J9601 Acute respiratory failure with hypoxia: Secondary | ICD-10-CM | POA: Diagnosis not present

## 2023-06-29 DIAGNOSIS — D72820 Lymphocytosis (symptomatic): Secondary | ICD-10-CM | POA: Diagnosis not present

## 2023-06-29 DIAGNOSIS — Z853 Personal history of malignant neoplasm of breast: Secondary | ICD-10-CM | POA: Diagnosis not present

## 2023-06-29 DIAGNOSIS — Z8673 Personal history of transient ischemic attack (TIA), and cerebral infarction without residual deficits: Secondary | ICD-10-CM | POA: Diagnosis not present

## 2023-06-29 DIAGNOSIS — C9110 Chronic lymphocytic leukemia of B-cell type not having achieved remission: Secondary | ICD-10-CM | POA: Diagnosis not present

## 2023-06-29 DIAGNOSIS — N3000 Acute cystitis without hematuria: Secondary | ICD-10-CM | POA: Diagnosis not present

## 2023-06-29 DIAGNOSIS — J9601 Acute respiratory failure with hypoxia: Secondary | ICD-10-CM | POA: Diagnosis not present

## 2023-06-29 DIAGNOSIS — I10 Essential (primary) hypertension: Secondary | ICD-10-CM | POA: Diagnosis not present

## 2023-06-29 DIAGNOSIS — J189 Pneumonia, unspecified organism: Secondary | ICD-10-CM | POA: Diagnosis not present

## 2023-06-30 DIAGNOSIS — Z853 Personal history of malignant neoplasm of breast: Secondary | ICD-10-CM | POA: Diagnosis not present

## 2023-06-30 DIAGNOSIS — J189 Pneumonia, unspecified organism: Secondary | ICD-10-CM | POA: Diagnosis not present

## 2023-06-30 DIAGNOSIS — D72820 Lymphocytosis (symptomatic): Secondary | ICD-10-CM | POA: Diagnosis not present

## 2023-06-30 DIAGNOSIS — Z8673 Personal history of transient ischemic attack (TIA), and cerebral infarction without residual deficits: Secondary | ICD-10-CM | POA: Diagnosis not present

## 2023-06-30 DIAGNOSIS — N3000 Acute cystitis without hematuria: Secondary | ICD-10-CM | POA: Diagnosis not present

## 2023-06-30 DIAGNOSIS — J9601 Acute respiratory failure with hypoxia: Secondary | ICD-10-CM | POA: Diagnosis not present

## 2023-06-30 DIAGNOSIS — C9110 Chronic lymphocytic leukemia of B-cell type not having achieved remission: Secondary | ICD-10-CM | POA: Diagnosis not present

## 2023-06-30 DIAGNOSIS — I10 Essential (primary) hypertension: Secondary | ICD-10-CM | POA: Diagnosis not present

## 2023-07-01 DIAGNOSIS — R0902 Hypoxemia: Secondary | ICD-10-CM | POA: Diagnosis not present

## 2023-07-01 DIAGNOSIS — J9601 Acute respiratory failure with hypoxia: Secondary | ICD-10-CM | POA: Diagnosis not present

## 2023-07-01 DIAGNOSIS — D72820 Lymphocytosis (symptomatic): Secondary | ICD-10-CM | POA: Diagnosis not present

## 2023-07-01 DIAGNOSIS — N3000 Acute cystitis without hematuria: Secondary | ICD-10-CM | POA: Diagnosis not present

## 2023-07-01 DIAGNOSIS — Z7401 Bed confinement status: Secondary | ICD-10-CM | POA: Diagnosis not present

## 2023-07-01 DIAGNOSIS — G459 Transient cerebral ischemic attack, unspecified: Secondary | ICD-10-CM | POA: Diagnosis not present

## 2023-07-01 DIAGNOSIS — J189 Pneumonia, unspecified organism: Secondary | ICD-10-CM | POA: Diagnosis not present

## 2023-07-01 DIAGNOSIS — Z8673 Personal history of transient ischemic attack (TIA), and cerebral infarction without residual deficits: Secondary | ICD-10-CM | POA: Diagnosis not present

## 2023-07-01 DIAGNOSIS — G8190 Hemiplegia, unspecified affecting unspecified side: Secondary | ICD-10-CM | POA: Diagnosis not present

## 2023-07-01 DIAGNOSIS — Z853 Personal history of malignant neoplasm of breast: Secondary | ICD-10-CM | POA: Diagnosis not present

## 2023-07-01 DIAGNOSIS — C9110 Chronic lymphocytic leukemia of B-cell type not having achieved remission: Secondary | ICD-10-CM | POA: Diagnosis not present

## 2023-07-01 DIAGNOSIS — I10 Essential (primary) hypertension: Secondary | ICD-10-CM | POA: Diagnosis not present

## 2023-07-04 DIAGNOSIS — M150 Primary generalized (osteo)arthritis: Secondary | ICD-10-CM | POA: Diagnosis not present

## 2023-07-04 DIAGNOSIS — G8191 Hemiplegia, unspecified affecting right dominant side: Secondary | ICD-10-CM | POA: Diagnosis not present

## 2023-07-04 DIAGNOSIS — C9110 Chronic lymphocytic leukemia of B-cell type not having achieved remission: Secondary | ICD-10-CM | POA: Diagnosis not present

## 2023-07-04 DIAGNOSIS — Z7982 Long term (current) use of aspirin: Secondary | ICD-10-CM | POA: Diagnosis not present

## 2023-07-04 DIAGNOSIS — I69321 Dysphasia following cerebral infarction: Secondary | ICD-10-CM | POA: Diagnosis not present

## 2023-07-04 DIAGNOSIS — I10 Essential (primary) hypertension: Secondary | ICD-10-CM | POA: Diagnosis not present

## 2023-07-10 DIAGNOSIS — I69321 Dysphasia following cerebral infarction: Secondary | ICD-10-CM | POA: Diagnosis not present

## 2023-07-10 DIAGNOSIS — Z853 Personal history of malignant neoplasm of breast: Secondary | ICD-10-CM | POA: Diagnosis not present

## 2023-07-10 DIAGNOSIS — M170 Bilateral primary osteoarthritis of knee: Secondary | ICD-10-CM | POA: Diagnosis not present

## 2023-07-10 DIAGNOSIS — I10 Essential (primary) hypertension: Secondary | ICD-10-CM | POA: Diagnosis not present

## 2023-07-10 DIAGNOSIS — H353130 Nonexudative age-related macular degeneration, bilateral, stage unspecified: Secondary | ICD-10-CM | POA: Diagnosis not present

## 2023-07-10 DIAGNOSIS — G459 Transient cerebral ischemic attack, unspecified: Secondary | ICD-10-CM | POA: Diagnosis not present

## 2023-07-11 DIAGNOSIS — F331 Major depressive disorder, recurrent, moderate: Secondary | ICD-10-CM | POA: Diagnosis not present

## 2023-07-11 DIAGNOSIS — F419 Anxiety disorder, unspecified: Secondary | ICD-10-CM | POA: Diagnosis not present

## 2023-07-17 DIAGNOSIS — G459 Transient cerebral ischemic attack, unspecified: Secondary | ICD-10-CM | POA: Diagnosis not present

## 2023-07-17 DIAGNOSIS — Z79899 Other long term (current) drug therapy: Secondary | ICD-10-CM | POA: Diagnosis not present

## 2023-07-17 DIAGNOSIS — I639 Cerebral infarction, unspecified: Secondary | ICD-10-CM | POA: Diagnosis not present

## 2023-07-17 DIAGNOSIS — E785 Hyperlipidemia, unspecified: Secondary | ICD-10-CM | POA: Diagnosis not present

## 2023-07-17 DIAGNOSIS — E559 Vitamin D deficiency, unspecified: Secondary | ICD-10-CM | POA: Diagnosis not present

## 2023-07-18 DIAGNOSIS — G4700 Insomnia, unspecified: Secondary | ICD-10-CM | POA: Diagnosis not present

## 2023-07-18 DIAGNOSIS — F419 Anxiety disorder, unspecified: Secondary | ICD-10-CM | POA: Diagnosis not present

## 2023-07-18 DIAGNOSIS — F331 Major depressive disorder, recurrent, moderate: Secondary | ICD-10-CM | POA: Diagnosis not present

## 2023-08-01 DIAGNOSIS — G8191 Hemiplegia, unspecified affecting right dominant side: Secondary | ICD-10-CM | POA: Diagnosis not present

## 2023-08-01 DIAGNOSIS — C9110 Chronic lymphocytic leukemia of B-cell type not having achieved remission: Secondary | ICD-10-CM | POA: Diagnosis not present

## 2023-08-01 DIAGNOSIS — M150 Primary generalized (osteo)arthritis: Secondary | ICD-10-CM | POA: Diagnosis not present

## 2023-08-07 DIAGNOSIS — I10 Essential (primary) hypertension: Secondary | ICD-10-CM | POA: Diagnosis not present

## 2023-08-07 DIAGNOSIS — M170 Bilateral primary osteoarthritis of knee: Secondary | ICD-10-CM | POA: Diagnosis not present

## 2023-08-14 DIAGNOSIS — I639 Cerebral infarction, unspecified: Secondary | ICD-10-CM | POA: Diagnosis not present

## 2023-08-14 DIAGNOSIS — G459 Transient cerebral ischemic attack, unspecified: Secondary | ICD-10-CM | POA: Diagnosis not present

## 2023-08-15 DIAGNOSIS — F419 Anxiety disorder, unspecified: Secondary | ICD-10-CM | POA: Diagnosis not present

## 2023-08-15 DIAGNOSIS — G4700 Insomnia, unspecified: Secondary | ICD-10-CM | POA: Diagnosis not present

## 2023-08-15 DIAGNOSIS — F331 Major depressive disorder, recurrent, moderate: Secondary | ICD-10-CM | POA: Diagnosis not present

## 2023-08-15 DIAGNOSIS — G3184 Mild cognitive impairment, so stated: Secondary | ICD-10-CM | POA: Diagnosis not present

## 2023-08-29 DIAGNOSIS — F419 Anxiety disorder, unspecified: Secondary | ICD-10-CM | POA: Diagnosis not present

## 2023-08-29 DIAGNOSIS — F331 Major depressive disorder, recurrent, moderate: Secondary | ICD-10-CM | POA: Diagnosis not present

## 2023-08-29 DIAGNOSIS — G4700 Insomnia, unspecified: Secondary | ICD-10-CM | POA: Diagnosis not present

## 2023-09-04 DIAGNOSIS — G8191 Hemiplegia, unspecified affecting right dominant side: Secondary | ICD-10-CM | POA: Diagnosis not present

## 2023-09-04 DIAGNOSIS — I10 Essential (primary) hypertension: Secondary | ICD-10-CM | POA: Diagnosis not present

## 2023-09-04 DIAGNOSIS — I69321 Dysphasia following cerebral infarction: Secondary | ICD-10-CM | POA: Diagnosis not present

## 2023-09-04 DIAGNOSIS — G459 Transient cerebral ischemic attack, unspecified: Secondary | ICD-10-CM | POA: Diagnosis not present

## 2023-09-10 DIAGNOSIS — Q791 Other congenital malformations of diaphragm: Secondary | ICD-10-CM | POA: Diagnosis not present

## 2023-09-10 DIAGNOSIS — G459 Transient cerebral ischemic attack, unspecified: Secondary | ICD-10-CM | POA: Diagnosis not present

## 2023-09-10 DIAGNOSIS — R059 Cough, unspecified: Secondary | ICD-10-CM | POA: Diagnosis not present

## 2023-09-10 DIAGNOSIS — I639 Cerebral infarction, unspecified: Secondary | ICD-10-CM | POA: Diagnosis not present

## 2023-09-14 DIAGNOSIS — R062 Wheezing: Secondary | ICD-10-CM | POA: Diagnosis not present

## 2023-09-14 DIAGNOSIS — H3530 Unspecified macular degeneration: Secondary | ICD-10-CM | POA: Diagnosis not present

## 2023-09-14 DIAGNOSIS — M170 Bilateral primary osteoarthritis of knee: Secondary | ICD-10-CM | POA: Diagnosis not present

## 2023-09-14 DIAGNOSIS — I10 Essential (primary) hypertension: Secondary | ICD-10-CM | POA: Diagnosis not present

## 2023-09-18 DIAGNOSIS — R1312 Dysphagia, oropharyngeal phase: Secondary | ICD-10-CM | POA: Diagnosis not present

## 2023-09-18 DIAGNOSIS — I6930 Unspecified sequelae of cerebral infarction: Secondary | ICD-10-CM | POA: Diagnosis not present

## 2023-09-18 DIAGNOSIS — R197 Diarrhea, unspecified: Secondary | ICD-10-CM | POA: Diagnosis not present

## 2023-09-18 DIAGNOSIS — R062 Wheezing: Secondary | ICD-10-CM | POA: Diagnosis not present

## 2023-09-18 DIAGNOSIS — N182 Chronic kidney disease, stage 2 (mild): Secondary | ICD-10-CM | POA: Diagnosis not present

## 2023-09-18 DIAGNOSIS — I129 Hypertensive chronic kidney disease with stage 1 through stage 4 chronic kidney disease, or unspecified chronic kidney disease: Secondary | ICD-10-CM | POA: Diagnosis not present

## 2023-09-23 DIAGNOSIS — N182 Chronic kidney disease, stage 2 (mild): Secondary | ICD-10-CM | POA: Diagnosis not present

## 2023-09-23 DIAGNOSIS — I129 Hypertensive chronic kidney disease with stage 1 through stage 4 chronic kidney disease, or unspecified chronic kidney disease: Secondary | ICD-10-CM | POA: Diagnosis not present

## 2023-09-30 DIAGNOSIS — R519 Headache, unspecified: Secondary | ICD-10-CM | POA: Diagnosis not present

## 2023-09-30 DIAGNOSIS — S066X9A Traumatic subarachnoid hemorrhage with loss of consciousness of unspecified duration, initial encounter: Secondary | ICD-10-CM | POA: Diagnosis not present

## 2023-09-30 DIAGNOSIS — J168 Pneumonia due to other specified infectious organisms: Secondary | ICD-10-CM | POA: Diagnosis not present

## 2023-09-30 DIAGNOSIS — S065X0A Traumatic subdural hemorrhage without loss of consciousness, initial encounter: Secondary | ICD-10-CM | POA: Diagnosis not present

## 2023-09-30 DIAGNOSIS — Z20822 Contact with and (suspected) exposure to covid-19: Secondary | ICD-10-CM | POA: Diagnosis not present

## 2023-11-12 DIAGNOSIS — F339 Major depressive disorder, recurrent, unspecified: Secondary | ICD-10-CM | POA: Diagnosis not present

## 2023-11-12 DIAGNOSIS — C50919 Malignant neoplasm of unspecified site of unspecified female breast: Secondary | ICD-10-CM | POA: Diagnosis not present

## 2023-11-13 DIAGNOSIS — M25579 Pain in unspecified ankle and joints of unspecified foot: Secondary | ICD-10-CM | POA: Diagnosis not present

## 2023-11-13 DIAGNOSIS — Z9981 Dependence on supplemental oxygen: Secondary | ICD-10-CM | POA: Diagnosis not present

## 2023-11-13 DIAGNOSIS — I10 Essential (primary) hypertension: Secondary | ICD-10-CM | POA: Diagnosis not present

## 2023-11-30 DIAGNOSIS — J9601 Acute respiratory failure with hypoxia: Secondary | ICD-10-CM | POA: Diagnosis not present

## 2023-12-10 DIAGNOSIS — Z9981 Dependence on supplemental oxygen: Secondary | ICD-10-CM | POA: Diagnosis not present

## 2023-12-10 DIAGNOSIS — I10 Essential (primary) hypertension: Secondary | ICD-10-CM | POA: Diagnosis not present

## 2023-12-10 DIAGNOSIS — M25579 Pain in unspecified ankle and joints of unspecified foot: Secondary | ICD-10-CM | POA: Diagnosis not present

## 2023-12-11 DIAGNOSIS — E559 Vitamin D deficiency, unspecified: Secondary | ICD-10-CM | POA: Diagnosis not present

## 2023-12-11 DIAGNOSIS — Z79899 Other long term (current) drug therapy: Secondary | ICD-10-CM | POA: Diagnosis not present

## 2023-12-11 DIAGNOSIS — E785 Hyperlipidemia, unspecified: Secondary | ICD-10-CM | POA: Diagnosis not present

## 2023-12-11 DIAGNOSIS — J9811 Atelectasis: Secondary | ICD-10-CM | POA: Diagnosis not present

## 2023-12-19 DIAGNOSIS — G4700 Insomnia, unspecified: Secondary | ICD-10-CM | POA: Diagnosis not present

## 2023-12-19 DIAGNOSIS — F411 Generalized anxiety disorder: Secondary | ICD-10-CM | POA: Diagnosis not present

## 2023-12-19 DIAGNOSIS — F331 Major depressive disorder, recurrent, moderate: Secondary | ICD-10-CM | POA: Diagnosis not present

## 2023-12-24 DIAGNOSIS — Z87440 Personal history of urinary (tract) infections: Secondary | ICD-10-CM | POA: Diagnosis not present

## 2023-12-24 DIAGNOSIS — N390 Urinary tract infection, site not specified: Secondary | ICD-10-CM | POA: Diagnosis not present

## 2023-12-31 DIAGNOSIS — J9601 Acute respiratory failure with hypoxia: Secondary | ICD-10-CM | POA: Diagnosis not present

## 2024-01-07 DIAGNOSIS — M150 Primary generalized (osteo)arthritis: Secondary | ICD-10-CM | POA: Diagnosis not present

## 2024-01-07 DIAGNOSIS — Z9981 Dependence on supplemental oxygen: Secondary | ICD-10-CM | POA: Diagnosis not present

## 2024-01-07 DIAGNOSIS — I10 Essential (primary) hypertension: Secondary | ICD-10-CM | POA: Diagnosis not present

## 2024-01-15 DIAGNOSIS — F331 Major depressive disorder, recurrent, moderate: Secondary | ICD-10-CM | POA: Diagnosis not present

## 2024-01-15 DIAGNOSIS — F411 Generalized anxiety disorder: Secondary | ICD-10-CM | POA: Diagnosis not present

## 2024-01-16 DIAGNOSIS — F411 Generalized anxiety disorder: Secondary | ICD-10-CM | POA: Diagnosis not present

## 2024-01-16 DIAGNOSIS — F331 Major depressive disorder, recurrent, moderate: Secondary | ICD-10-CM | POA: Diagnosis not present

## 2024-01-16 DIAGNOSIS — G4700 Insomnia, unspecified: Secondary | ICD-10-CM | POA: Diagnosis not present

## 2024-01-30 DIAGNOSIS — F331 Major depressive disorder, recurrent, moderate: Secondary | ICD-10-CM | POA: Diagnosis not present

## 2024-01-30 DIAGNOSIS — J9601 Acute respiratory failure with hypoxia: Secondary | ICD-10-CM | POA: Diagnosis not present

## 2024-01-30 DIAGNOSIS — F411 Generalized anxiety disorder: Secondary | ICD-10-CM | POA: Diagnosis not present

## 2024-01-31 DIAGNOSIS — G4700 Insomnia, unspecified: Secondary | ICD-10-CM | POA: Diagnosis not present

## 2024-01-31 DIAGNOSIS — K5900 Constipation, unspecified: Secondary | ICD-10-CM | POA: Diagnosis not present

## 2024-01-31 DIAGNOSIS — E039 Hypothyroidism, unspecified: Secondary | ICD-10-CM | POA: Diagnosis not present

## 2024-02-05 DIAGNOSIS — Z9981 Dependence on supplemental oxygen: Secondary | ICD-10-CM | POA: Diagnosis not present

## 2024-02-05 DIAGNOSIS — M150 Primary generalized (osteo)arthritis: Secondary | ICD-10-CM | POA: Diagnosis not present

## 2024-02-05 DIAGNOSIS — I10 Essential (primary) hypertension: Secondary | ICD-10-CM | POA: Diagnosis not present

## 2024-02-12 DIAGNOSIS — F411 Generalized anxiety disorder: Secondary | ICD-10-CM | POA: Diagnosis not present

## 2024-02-12 DIAGNOSIS — F331 Major depressive disorder, recurrent, moderate: Secondary | ICD-10-CM | POA: Diagnosis not present

## 2024-02-13 DIAGNOSIS — F411 Generalized anxiety disorder: Secondary | ICD-10-CM | POA: Diagnosis not present

## 2024-02-13 DIAGNOSIS — F331 Major depressive disorder, recurrent, moderate: Secondary | ICD-10-CM | POA: Diagnosis not present

## 2024-02-13 DIAGNOSIS — G4700 Insomnia, unspecified: Secondary | ICD-10-CM | POA: Diagnosis not present

## 2024-02-14 DIAGNOSIS — K5900 Constipation, unspecified: Secondary | ICD-10-CM | POA: Diagnosis not present

## 2024-02-14 DIAGNOSIS — E039 Hypothyroidism, unspecified: Secondary | ICD-10-CM | POA: Diagnosis not present

## 2024-02-14 DIAGNOSIS — I10 Essential (primary) hypertension: Secondary | ICD-10-CM | POA: Diagnosis not present

## 2024-03-01 DIAGNOSIS — J9601 Acute respiratory failure with hypoxia: Secondary | ICD-10-CM | POA: Diagnosis not present

## 2024-03-06 DIAGNOSIS — I10 Essential (primary) hypertension: Secondary | ICD-10-CM | POA: Diagnosis not present

## 2024-03-06 DIAGNOSIS — R630 Anorexia: Secondary | ICD-10-CM | POA: Diagnosis not present

## 2024-03-06 DIAGNOSIS — E039 Hypothyroidism, unspecified: Secondary | ICD-10-CM | POA: Diagnosis not present

## 2024-03-11 DIAGNOSIS — F411 Generalized anxiety disorder: Secondary | ICD-10-CM | POA: Diagnosis not present

## 2024-03-11 DIAGNOSIS — I639 Cerebral infarction, unspecified: Secondary | ICD-10-CM | POA: Diagnosis not present

## 2024-03-12 DIAGNOSIS — G4700 Insomnia, unspecified: Secondary | ICD-10-CM | POA: Diagnosis not present

## 2024-03-12 DIAGNOSIS — F331 Major depressive disorder, recurrent, moderate: Secondary | ICD-10-CM | POA: Diagnosis not present

## 2024-03-12 DIAGNOSIS — F411 Generalized anxiety disorder: Secondary | ICD-10-CM | POA: Diagnosis not present

## 2024-03-12 DIAGNOSIS — G3184 Mild cognitive impairment, so stated: Secondary | ICD-10-CM | POA: Diagnosis not present

## 2024-03-25 DIAGNOSIS — H04123 Dry eye syndrome of bilateral lacrimal glands: Secondary | ICD-10-CM | POA: Diagnosis not present

## 2024-03-25 DIAGNOSIS — H43813 Vitreous degeneration, bilateral: Secondary | ICD-10-CM | POA: Diagnosis not present

## 2024-03-25 DIAGNOSIS — H353222 Exudative age-related macular degeneration, left eye, with inactive choroidal neovascularization: Secondary | ICD-10-CM | POA: Diagnosis not present

## 2024-03-25 DIAGNOSIS — H353213 Exudative age-related macular degeneration, right eye, with inactive scar: Secondary | ICD-10-CM | POA: Diagnosis not present

## 2024-03-31 DIAGNOSIS — J9601 Acute respiratory failure with hypoxia: Secondary | ICD-10-CM | POA: Diagnosis not present

## 2024-04-03 DIAGNOSIS — F411 Generalized anxiety disorder: Secondary | ICD-10-CM | POA: Diagnosis not present

## 2024-04-03 DIAGNOSIS — E039 Hypothyroidism, unspecified: Secondary | ICD-10-CM | POA: Diagnosis not present

## 2024-04-03 DIAGNOSIS — I10 Essential (primary) hypertension: Secondary | ICD-10-CM | POA: Diagnosis not present

## 2024-04-09 DIAGNOSIS — F331 Major depressive disorder, recurrent, moderate: Secondary | ICD-10-CM | POA: Diagnosis not present

## 2024-04-09 DIAGNOSIS — F411 Generalized anxiety disorder: Secondary | ICD-10-CM | POA: Diagnosis not present

## 2024-04-09 DIAGNOSIS — G3184 Mild cognitive impairment, so stated: Secondary | ICD-10-CM | POA: Diagnosis not present

## 2024-04-09 DIAGNOSIS — G4700 Insomnia, unspecified: Secondary | ICD-10-CM | POA: Diagnosis not present

## 2024-04-10 DIAGNOSIS — I639 Cerebral infarction, unspecified: Secondary | ICD-10-CM | POA: Diagnosis not present

## 2024-04-10 DIAGNOSIS — F411 Generalized anxiety disorder: Secondary | ICD-10-CM | POA: Diagnosis not present

## 2024-04-23 DIAGNOSIS — F411 Generalized anxiety disorder: Secondary | ICD-10-CM | POA: Diagnosis not present

## 2024-04-23 DIAGNOSIS — G3184 Mild cognitive impairment, so stated: Secondary | ICD-10-CM | POA: Diagnosis not present

## 2024-04-23 DIAGNOSIS — F331 Major depressive disorder, recurrent, moderate: Secondary | ICD-10-CM | POA: Diagnosis not present

## 2024-04-23 DIAGNOSIS — G4700 Insomnia, unspecified: Secondary | ICD-10-CM | POA: Diagnosis not present

## 2024-05-01 DIAGNOSIS — J9601 Acute respiratory failure with hypoxia: Secondary | ICD-10-CM | POA: Diagnosis not present

## 2024-05-07 DIAGNOSIS — G4700 Insomnia, unspecified: Secondary | ICD-10-CM | POA: Diagnosis not present

## 2024-05-07 DIAGNOSIS — G3184 Mild cognitive impairment, so stated: Secondary | ICD-10-CM | POA: Diagnosis not present

## 2024-05-07 DIAGNOSIS — F331 Major depressive disorder, recurrent, moderate: Secondary | ICD-10-CM | POA: Diagnosis not present

## 2024-05-07 DIAGNOSIS — F411 Generalized anxiety disorder: Secondary | ICD-10-CM | POA: Diagnosis not present

## 2024-05-08 DIAGNOSIS — I10 Essential (primary) hypertension: Secondary | ICD-10-CM | POA: Diagnosis not present

## 2024-05-08 DIAGNOSIS — E039 Hypothyroidism, unspecified: Secondary | ICD-10-CM | POA: Diagnosis not present

## 2024-05-08 DIAGNOSIS — R630 Anorexia: Secondary | ICD-10-CM | POA: Diagnosis not present

## 2024-05-12 DIAGNOSIS — F411 Generalized anxiety disorder: Secondary | ICD-10-CM | POA: Diagnosis not present

## 2024-05-12 DIAGNOSIS — I639 Cerebral infarction, unspecified: Secondary | ICD-10-CM | POA: Diagnosis not present

## 2024-05-21 DIAGNOSIS — I10 Essential (primary) hypertension: Secondary | ICD-10-CM | POA: Diagnosis not present

## 2024-05-21 DIAGNOSIS — R630 Anorexia: Secondary | ICD-10-CM | POA: Diagnosis not present

## 2024-05-21 DIAGNOSIS — E039 Hypothyroidism, unspecified: Secondary | ICD-10-CM | POA: Diagnosis not present

## 2024-06-01 DIAGNOSIS — J9601 Acute respiratory failure with hypoxia: Secondary | ICD-10-CM | POA: Diagnosis not present

## 2024-06-04 DIAGNOSIS — G4700 Insomnia, unspecified: Secondary | ICD-10-CM | POA: Diagnosis not present

## 2024-06-04 DIAGNOSIS — F331 Major depressive disorder, recurrent, moderate: Secondary | ICD-10-CM | POA: Diagnosis not present

## 2024-06-04 DIAGNOSIS — G3184 Mild cognitive impairment, so stated: Secondary | ICD-10-CM | POA: Diagnosis not present

## 2024-06-04 DIAGNOSIS — F411 Generalized anxiety disorder: Secondary | ICD-10-CM | POA: Diagnosis not present

## 2024-06-12 DIAGNOSIS — I10 Essential (primary) hypertension: Secondary | ICD-10-CM | POA: Diagnosis not present

## 2024-06-12 DIAGNOSIS — E039 Hypothyroidism, unspecified: Secondary | ICD-10-CM | POA: Diagnosis not present

## 2024-06-12 DIAGNOSIS — B372 Candidiasis of skin and nail: Secondary | ICD-10-CM | POA: Diagnosis not present

## 2024-06-17 DIAGNOSIS — F411 Generalized anxiety disorder: Secondary | ICD-10-CM | POA: Diagnosis not present

## 2024-06-17 DIAGNOSIS — E039 Hypothyroidism, unspecified: Secondary | ICD-10-CM | POA: Diagnosis not present

## 2024-06-18 DIAGNOSIS — G4700 Insomnia, unspecified: Secondary | ICD-10-CM | POA: Diagnosis not present

## 2024-06-18 DIAGNOSIS — G3184 Mild cognitive impairment, so stated: Secondary | ICD-10-CM | POA: Diagnosis not present

## 2024-06-18 DIAGNOSIS — F331 Major depressive disorder, recurrent, moderate: Secondary | ICD-10-CM | POA: Diagnosis not present

## 2024-06-19 DIAGNOSIS — E039 Hypothyroidism, unspecified: Secondary | ICD-10-CM | POA: Diagnosis not present

## 2024-06-19 DIAGNOSIS — B372 Candidiasis of skin and nail: Secondary | ICD-10-CM | POA: Diagnosis not present

## 2024-06-19 DIAGNOSIS — I10 Essential (primary) hypertension: Secondary | ICD-10-CM | POA: Diagnosis not present

## 2024-07-01 DIAGNOSIS — J9601 Acute respiratory failure with hypoxia: Secondary | ICD-10-CM | POA: Diagnosis not present

## 2024-07-02 DIAGNOSIS — G3184 Mild cognitive impairment, so stated: Secondary | ICD-10-CM | POA: Diagnosis not present

## 2024-07-02 DIAGNOSIS — F331 Major depressive disorder, recurrent, moderate: Secondary | ICD-10-CM | POA: Diagnosis not present

## 2024-07-17 DIAGNOSIS — E039 Hypothyroidism, unspecified: Secondary | ICD-10-CM | POA: Diagnosis not present

## 2024-07-17 DIAGNOSIS — I10 Essential (primary) hypertension: Secondary | ICD-10-CM | POA: Diagnosis not present

## 2024-07-17 DIAGNOSIS — R630 Anorexia: Secondary | ICD-10-CM | POA: Diagnosis not present

## 2024-07-17 DIAGNOSIS — F411 Generalized anxiety disorder: Secondary | ICD-10-CM | POA: Diagnosis not present

## 2024-07-30 DIAGNOSIS — G3184 Mild cognitive impairment, so stated: Secondary | ICD-10-CM | POA: Diagnosis not present

## 2024-07-30 DIAGNOSIS — F331 Major depressive disorder, recurrent, moderate: Secondary | ICD-10-CM | POA: Diagnosis not present

## 2024-07-31 DIAGNOSIS — B372 Candidiasis of skin and nail: Secondary | ICD-10-CM | POA: Diagnosis not present

## 2024-07-31 DIAGNOSIS — E039 Hypothyroidism, unspecified: Secondary | ICD-10-CM | POA: Diagnosis not present

## 2024-07-31 DIAGNOSIS — I10 Essential (primary) hypertension: Secondary | ICD-10-CM | POA: Diagnosis not present

## 2024-08-01 DIAGNOSIS — J9601 Acute respiratory failure with hypoxia: Secondary | ICD-10-CM | POA: Diagnosis not present

## 2024-08-04 DIAGNOSIS — R918 Other nonspecific abnormal finding of lung field: Secondary | ICD-10-CM | POA: Diagnosis not present

## 2024-08-07 DIAGNOSIS — J189 Pneumonia, unspecified organism: Secondary | ICD-10-CM | POA: Diagnosis not present

## 2024-08-07 DIAGNOSIS — R112 Nausea with vomiting, unspecified: Secondary | ICD-10-CM | POA: Diagnosis not present

## 2024-08-26 DIAGNOSIS — R300 Dysuria: Secondary | ICD-10-CM | POA: Diagnosis not present

## 2024-08-27 DIAGNOSIS — G3184 Mild cognitive impairment, so stated: Secondary | ICD-10-CM | POA: Diagnosis not present

## 2024-08-27 DIAGNOSIS — F331 Major depressive disorder, recurrent, moderate: Secondary | ICD-10-CM | POA: Diagnosis not present
